# Patient Record
Sex: Male | Born: 1955
Health system: Southern US, Community
[De-identification: ages and names within clinical notes are randomized; demographics above are authoritative.]

## PROBLEM LIST (undated history)

## (undated) DIAGNOSIS — G43909 Migraine, unspecified, not intractable, without status migrainosus: Secondary | ICD-10-CM

## (undated) DIAGNOSIS — C20 Malignant neoplasm of rectum: Secondary | ICD-10-CM

## (undated) DIAGNOSIS — M549 Dorsalgia, unspecified: Secondary | ICD-10-CM

## (undated) DIAGNOSIS — F419 Anxiety disorder, unspecified: Secondary | ICD-10-CM

## (undated) DIAGNOSIS — I10 Essential (primary) hypertension: Secondary | ICD-10-CM

## (undated) DIAGNOSIS — F32A Depression, unspecified: Secondary | ICD-10-CM

## (undated) DIAGNOSIS — R002 Palpitations: Secondary | ICD-10-CM

## (undated) DIAGNOSIS — I2510 Atherosclerotic heart disease of native coronary artery without angina pectoris: Secondary | ICD-10-CM

## (undated) DIAGNOSIS — Z87442 Personal history of urinary calculi: Secondary | ICD-10-CM

## (undated) DIAGNOSIS — J449 Chronic obstructive pulmonary disease, unspecified: Secondary | ICD-10-CM

## (undated) DIAGNOSIS — M25569 Pain in unspecified knee: Secondary | ICD-10-CM

## (undated) DIAGNOSIS — I209 Angina pectoris, unspecified: Secondary | ICD-10-CM

## (undated) DIAGNOSIS — F319 Bipolar disorder, unspecified: Secondary | ICD-10-CM

## (undated) DIAGNOSIS — R7303 Prediabetes: Secondary | ICD-10-CM

## (undated) DIAGNOSIS — K219 Gastro-esophageal reflux disease without esophagitis: Secondary | ICD-10-CM

## (undated) HISTORY — PX: JOINT REPLACEMENT: SHX530

## (undated) HISTORY — PX: BACK SURGERY: SHX140

---

## 2000-01-27 ENCOUNTER — Encounter: Payer: Self-pay | Admitting: Orthopedic Surgery

## 2000-02-01 ENCOUNTER — Inpatient Hospital Stay (HOSPITAL_COMMUNITY): Admission: RE | Admit: 2000-02-01 | Discharge: 2000-02-05 | Payer: Self-pay | Admitting: Orthopedic Surgery

## 2000-02-01 ENCOUNTER — Encounter: Payer: Self-pay | Admitting: Orthopedic Surgery

## 2001-11-07 ENCOUNTER — Encounter: Payer: Self-pay | Admitting: Emergency Medicine

## 2001-11-07 ENCOUNTER — Emergency Department (HOSPITAL_COMMUNITY): Admission: EM | Admit: 2001-11-07 | Discharge: 2001-11-07 | Payer: Self-pay | Admitting: Emergency Medicine

## 2003-02-12 ENCOUNTER — Observation Stay (HOSPITAL_COMMUNITY): Admission: EM | Admit: 2003-02-12 | Discharge: 2003-02-14 | Payer: Self-pay | Admitting: Emergency Medicine

## 2003-02-19 ENCOUNTER — Encounter: Admission: RE | Admit: 2003-02-19 | Discharge: 2003-02-19 | Payer: Self-pay | Admitting: Family Medicine

## 2006-11-21 ENCOUNTER — Emergency Department (HOSPITAL_COMMUNITY): Admission: EM | Admit: 2006-11-21 | Discharge: 2006-11-21 | Payer: Self-pay | Admitting: Emergency Medicine

## 2010-01-16 ENCOUNTER — Ambulatory Visit: Payer: Self-pay | Admitting: Cardiology

## 2010-01-16 DIAGNOSIS — R079 Chest pain, unspecified: Secondary | ICD-10-CM | POA: Insufficient documentation

## 2010-01-16 DIAGNOSIS — R9431 Abnormal electrocardiogram [ECG] [EKG]: Secondary | ICD-10-CM | POA: Insufficient documentation

## 2010-01-16 DIAGNOSIS — R0602 Shortness of breath: Secondary | ICD-10-CM | POA: Insufficient documentation

## 2010-01-16 DIAGNOSIS — I10 Essential (primary) hypertension: Secondary | ICD-10-CM | POA: Insufficient documentation

## 2010-01-26 ENCOUNTER — Telehealth (INDEPENDENT_AMBULATORY_CARE_PROVIDER_SITE_OTHER): Payer: Self-pay

## 2010-01-27 ENCOUNTER — Encounter: Payer: Self-pay | Admitting: Cardiology

## 2010-01-27 ENCOUNTER — Ambulatory Visit: Payer: Self-pay | Admitting: Cardiology

## 2010-01-27 ENCOUNTER — Ambulatory Visit: Payer: Self-pay

## 2010-01-27 ENCOUNTER — Encounter (HOSPITAL_COMMUNITY)
Admission: RE | Admit: 2010-01-27 | Discharge: 2010-04-07 | Payer: Self-pay | Source: Home / Self Care | Attending: Cardiology | Admitting: Cardiology

## 2010-01-27 ENCOUNTER — Ambulatory Visit (HOSPITAL_COMMUNITY): Admission: RE | Admit: 2010-01-27 | Discharge: 2010-01-27 | Payer: Self-pay | Admitting: Cardiology

## 2010-02-04 LAB — CONVERTED CEMR LAB
BUN: 17 mg/dL (ref 6–23)
CO2: 34 meq/L — ABNORMAL HIGH (ref 19–32)
Calcium: 9.8 mg/dL (ref 8.4–10.5)
Chloride: 101 meq/L (ref 96–112)
Creatinine, Ser: 1.5 mg/dL (ref 0.4–1.5)
GFR calc non Af Amer: 50.84 mL/min (ref >60)
Glucose, Bld: 83 mg/dL (ref 70–99)
Potassium: 4.2 meq/L (ref 3.5–5.1)
Sodium: 142 meq/L (ref 135–145)

## 2010-04-07 NOTE — Progress Notes (Signed)
Summary: Nuc. Pre-Procedure  Phone Note Outgoing Call Call back at [facility] Phone [phone]   Call placed by: Irean Hong, RN,  January 26, 2010 11:26 AM Summary of Call: Left message with information on Myoview Information Sheet (see scanned document for details).      Nuclear Med Background Indications for Stress Test: Evaluation for Ischemia   History: Abnormal EKG, Myocardial Perfusion Study  History Comments: '03 MPS: NL, EF=58%.  Symptoms: Chest Tightness, Chest Tightness with Exertion, DOE    Nuclear Pre-Procedure Cardiac Risk Factors: Hypertension, Smoker Height (in): 72

## 2010-04-07 NOTE — Assessment & Plan Note (Signed)
Summary: Cardiology Nuclear Testing  Nuclear Med Background Indications for Stress Test: Evaluation for Ischemia   History: Abnormal EKG, Myocardial Perfusion Study  History Comments: '03 MPS: NL, EF=58%.  Symptoms: Chest Tightness, Chest Tightness with Exertion, DOE, SOB    Nuclear Pre-Procedure Cardiac Risk Factors: Hypertension, Smoker Caffeine/Decaff Intake: none NPO After: 8:00 PM Lungs: clear IV 0.9% NS with Angio Cath: 22g     IV Site: R Hand Chest Size (in) 46     Height (in): 72 Weight (lb): 228 BMI: 31.03 Tech Comments: Propanolol held x 14hrs.  Nuclear Med Study 1 or 2 day study:  1 day     Stress Test Type:  Eugenie Birks Reading MD:  Marca Ancona, MD     Referring MD:  D.McLean Resting Radionuclide:  Technetium 62m Tetrofosmin     Resting Radionuclide Dose:  10.6 mCi  Stress Radionuclide:  Technetium 75m Tetrofosmin     Stress Radionuclide Dose:  33 mCi   Stress Protocol  Max Systolic BP: 138 mm Hg Lexiscan: 0.4 mg   Stress Test Technologist:  Milana Na, EMT-P     Nuclear Technologist:  Domenic Polite, CNMT  Rest Procedure  Myocardial perfusion imaging was performed at rest 45 minutes following the intravenous administration of Technetium 12m Tetrofosmin.  Stress Procedure  The patient received IV Lexiscan 0.4 mg over 15-seconds.  Technetium 106m Tetrofosmin injected at 30-seconds.  There were no significant changes with infusion.  Quantitative spect images were obtained after a 45 minute delay.  QPS Raw Data Images:  Normal; no motion artifact; normal heart/lung ratio. Stress Images:  Small basal inferior perfusion defect, slightly more prominent than rest. Rest Images:  Small basal inferior perfusion defect Subtraction (SDS):  Small basal inferior perfusion, partially reversible.  Transient Ischemic Dilatation:  1.26  (Normal <1.22)  Lung/Heart Ratio:  .34  (Normal <0.45)  Quantitative Gated Spect Images QGS EDV:  140 ml QGS ESV:  67 ml QGS EF:   52 % QGS cine images:  Mild global hypokinesis   Overall Impression  Exercise Capacity: Lexiscan with no exercise. BP Response: Normal blood pressure response. Clinical Symptoms: Short of breath.  ECG Impression: LVH with significant repolarization abnormality at baseline, no change with infusion.  Overall Impression: Small basal inferior perfusion defect with partial reversibility, suspect most likely diaphragmatic attenuation.  Overall Impression Comments: Overall low risk study.   Appended Document: Cardiology Nuclear Testing Low risk study  Appended Document: Cardiology Nuclear Testing pt given results by telephone

## 2010-04-07 NOTE — Assessment & Plan Note (Signed)
Summary: np6/abn ekg/secure horzion/mj   Visit Type:  new pt Primary Provider:  Reuel Boom  CC:  headache.  History of Present Illness: 55 yo with history of HTN and bipolar disorder presents for evaluation of abnormal ECG.  Patient's primary provider noted anterior and lateral T wave inversions and ST depression on recent ECG, so he was referred for cardiology evaluation.  Patient does report episodes of central chest tightness.  This occurs with heavier exertion, such as walking fast up steps.  It also occurs with stress.  He tends to get this symptom rarely (maybe once a month for several years).  He gets short of breath with walking long distances but does smoke.  No orthopnea.  Physical exertion is limited by knee pain.  He did have an adenosine myoview in the office 10-15 years ago (? prior to knee surgery) that was normal per his report.  BP is elevated at 147/102 today.   ECG: NSR, anterior and lateral T wave inversions with 1 mm ST depression (no prior recently in our system and old chart not available at this time).   Labs (6/11): LDL 93, HDL 35, TGs 240, HCT 51.4  Current Medications (verified): 1)  Thiothixene 5 Mg Caps (Thiothixene) .... Once Daily 2)  Benztropine Mesylate 1 Mg Tabs (Benztropine Mesylate) .... Once Daily 3)  Propranolol Hcl 40 Mg Tabs (Propranolol Hcl) .... Two Times A Day 4)  Lotensin Hct 20-12.5 Mg Tabs (Benazepril-Hydrochlorothiazide) .... Once Daily 5)  Depakote 500 Mg Tbec (Divalproex Sodium) .... 2 Tablets Once Daily 6)  Alprazolam 1 Mg Tabs (Alprazolam) .... Take 1 Tablet, 4 Times Daily 7)  Aspirin 81 Mg Tbec (Aspirin) .... Take One Tablet By Mouth Daily  Allergies (verified): No Known Drug Allergies  Past History:  Past Medical History: 1. hypertension  2. bipolar disorder 3. migraine headaches 4. TKR 2001 5. Active smoker  Family History: No CAD or other heart trouble that he knows of.   Social History: Tobacco Use - Yes, 1/2 ppd.  Drug Use  - no On disability Lives in [place] with girlfriend, has 2 sons  Review of Systems       All systems reviewed and negative except as per HPI.   Vital Signs:  Patient profile:   55 year old male Height:      72 inches Weight:      230.75 pounds BMI:     31.41 Pulse rate:   93 / minute BP sitting:   147 / 102  (left arm) Cuff size:   large  Vitals Entered By: Caralee Ates CMA (January 16, 2010 9:58 AM)  Physical Exam  General:  Well developed, well nourished, in no acute distress. Head:  normocephalic and atraumatic Nose:  no deformity, discharge, inflammation, or lesions Mouth:  Teeth, gums and palate normal. Oral mucosa normal. Neck:  Neck supple, no JVD. No masses, thyromegaly or abnormal cervical nodes. Lungs:  Slightly decreased breath sounds bilaterally.  Heart:  Non-displaced PMI, chest non-tender; regular rate and rhythm, S1, S2 without murmurs, rubs. +S4. Carotid upstroke normal, no bruit. Normal abdominal aortic size, no bruits. Femorals normal pulses, no bruits. Pedals normal pulses. No edema, no varicosities. Abdomen:  Bowel sounds positive; abdomen soft and non-tender without masses, organomegaly, or hernias noted. No hepatosplenomegaly. Extremities:  No clubbing or cyanosis. Neurologic:  Alert and oriented x 3. Skin:  Intact without lesions or rashes. Psych:  Normal affect.   Impression & Recommendations:  Problem # 1:  CHEST PAIN (ICD-786.50)  Patient has occasional episodes of chest tightness with heavier exertion like going up stairs fast.  He also gets more atypical symptoms with emotional stress.  His ECG is abnormal with T wave inversions and ST depression.  No prior ECG to compare.  This could be a form of LVH with repolarization abnormality, but the ECG does not strictly fit the criteria for LVH, so coronary disease is certainly a worry.  I will set him up for a Lexiscan myoview to assess for ischemia.  I will also get an echo given shortness of breath  (though this may be due to smoking). He will continue ASA 81 mg daily.   Problem # 2:  HYPERTENSION, UNSPECIFIED (ICD-401.9) BP is elevated, S4 on exam.  Increase benazepril/HCT to 40/25.  BMET prior to followup appointment.  Will reassess BP at followup.   Problem # 3:  SMOKING I strongly encouraged the patient to quit smoking.    Other Orders: Nuclear Stress Test (Nuc Stress Test) Echocardiogram (Echo)  Patient Instructions: 1)  Your physician has recommended you make the following change in your medication:  2)  Increase Benazepril/HCT to 40/25mg  daily--you can take two Benazepril/HCT 20mg /12.5mg  daily at the same time. 3)  Your physician has requested that you have an lexiscan myoview.  For further information please visit [email or web address]  Please follow instruction sheet, as given. 4)  Your physician has requested that you have an echocardiogram.  Echocardiography is a painless test that uses sound waves to create images of your heart. It provides your doctor with information about the size and shape of your heart and how well your heart's chambers and valves are working.  This procedure takes approximately one hour. There are no restrictions for this procedure. 5)  Your physician recommends that you return for lab work in: 2 weeks--BMP 786.50  6)  Your physician recommends that you schedule a follow-up appointment in: 2 weeks with Dr Shirlee Latch. It is OK to have the lab the same day you see Dr Shirlee Latch. Prescriptions: BENAZEPRIL-HYDROCHLOROTHIAZIDE 20-12.5 MG TABS (BENAZEPRIL-HYDROCHLOROTHIAZIDE) two tablets daily  #60 x 6   Entered by:   Katina Dung, RN, BSN   Authorized by:   Marca Ancona, MD   Signed by:   Katina Dung, RN, BSN on 01/16/2010   Method used:   Electronically to        [facility] [phone]* (retail)       [address]       [place], [place]  [address]       Ph: [phone] or [phone]       Fax: [phone]   RxID:    [id number]

## 2010-07-24 NOTE — Procedures (Signed)
. [facility]  Patient:    William Good, William Good                  MRN: [id number] Proc. Date: 02/01/00 Adm. Date:  [id number] Attending:  Colbert Ewing                           Procedure Report  I was consulted by Dr. Richardson Landry to provide postoperative pain relief for his patient, William Good.  We decided this should take the form of epidural catheter placed postoperatively and monitored by the anesthesiologist.  The risks and benefits of the procedure were discussed with the patient in the preoperative period.  He is aware of the risks and benefits and agreed with the procedure.  At the completion of the operative procedure, the patient was turned into the right lateral decubitus position.  The back was sprayed fully with Betadine spray.  The area was draped.  A #18 gauge Hustead needle was used in a paramedian approach at the L2-3 interspace.  A loss of resistance technique with air was used.  The epidural space was located easily on the first attempt.  A Burron catheter was passed 5 cm into the epidural space.  The catheter passed easily.  The needle was removed without apparent movement of the catheter.  The catheter was aspirated.  There was no evidence of spinal fluid or blood.  The catheter was injected with a solution containing 10 cc of sterile preservative-free normal saline, 5 cc of 1% lidocaine plain, and 100 mcg of fentanyl in 2 cc.  The solution injected easily.  Repeat aspiration revealed no evidence of cerebrospinal fluid or blood.  Catheter was taped securely in place.  The patient was placed in a supine position and emerged uneventfully from the anesthetic.  The patient was brought to the recovery room in good condition. DD:  02/01/00 TD:  02/01/00 Job: [id number]

## 2010-07-24 NOTE — Discharge Summary (Signed)
[place]  Patient:    William Good, William Good                  MRN: [id number] Adm. Date:  [id number] Disc. Date: [id number] Attending:  Colbert Ewing Dictator:   Oris Drone. Petrarca, P.A.-C.                           Discharge Summary  ADMISSION DIAGNOSIS:  Advanced degenerative joint disease of the right knee.  DISCHARGE DIAGNOSES: 1. Advanced degenerative joint disease of the right knee. 2. History of angina. 3. Tobacco use. 4. Migraine.  PROCEDURE:  Right total knee replacement.  HISTORY OF PRESENT ILLNESS:  [age]-year-old male with persistent right knee pain.  He has documented grade IV DJD by arthroscopy in May 2000.  He has failed conservative treatment.  He is now indicated for right total knee replacement.  HOSPITAL COURSE:  [age]-year-old white male admitted February 01, 2000. After appropriate laboratory studies were obtained as well as 1 g IV Ancef on call to the operating room, he was taken to operating room where he underwent a right total knee replacement.  He tolerated the procedure well.  He was continued postoperatively on Ancef 1 g IV q.8h. x three doses.  Heparin and Coumadin were begun for antithrombotic prophylaxis as per protocol. Consultation with PT and OT were obtained.  Physical therapy for ambulation and weight bearing as tolerated on the right.  A CPM was placed at 0-30 degrees and incremented daily by 10 degrees at 8 hours per day.  He was allowed out of bed to chair the following day.  He did have temperature elevation, and this resolved.  He had a postoperative ileus and this was resolved with conservative treatment.  Reglan 10 mg IV q.8h. was begun.  The remainder of his hospital course was uneventful.  He was discharged on February 05, 2000, to return back to the office in 10 days for suture removal.  LABORATORY DATA:  The EKG showed normal sinus rhythm, nonspecific  T-wave abnormalities.  Radiographic studies February 01, 2000, showed satisfactory right total knee replacement.  Laboratory studies:  Preoperative hemoglobin 16.8, hematocrit 47.7%, white count 13,800, platelet count 324,000.  Discharge hemoglobin 12.1, hematocrit 33.2%.  Preoperative chemistries:  Sodium 141, potassium 4.3, chloride 104, CO2 29, glucose 93, creatinine 0.9, calcium 9.6, total protein 6.7, albumin 4.2, AST 24, ALT 34, alkaline phosphatase 74, total bilirubin 1.1.  Discharge sodium 137, potassium 3.2, chloride 101, CO2 27, glucose 120, BUN 8, creatinine 0.9, calcium 8.7.  Urinalysis showed moderate hemoglobin, 0-5 white cells, 0-5 red cells and uric acid crystals noted.  Blood type was O positive. Antibody screen negative.  DISCHARGE INSTRUCTIONS: 1. Given a prescription for Percocet 5/325 mg one q.4h. p.r.n. pain. 2. Parafon Forte DSC one q.8h. p.r.n. spasms. 3. Iron sulfate 325 mg one p.o. daily with meals. 4. Coumadin 5 mg as per pharmacy. 5. No restriction on his diet. 6. Activity as tolerated.  Weight bearing as tolerated. 7. He will follow up in the office in 10 days for re-evaluation    and removal of staples.  DISCHARGE CONDITION:  He was discharged in improved condition. DD:  03/14/00 TD:  03/14/00 Job: [id number]

## 2010-07-24 NOTE — Op Note (Signed)
[place]. [facility]  Patient:    William Good, William Good                  MRN: [id number] Proc. Date: 02/01/00 Adm. Date:  [id number] Attending:  Colbert Ewing                           Operative Report  PREOPERATIVE DIAGNOSES:  End-stage degenerative joint disease right knee status post previous anterior cruciate ligament reconstruction with retained metallic screws in the tibia and femur.  POSTOPERATIVE DIAGNOSIS:  End-stage degenerative joint disease right knee status post previous anterior cruciate ligament reconstruction with retained metallic screws in the tibia and femur.  PROCEDURE:  Total knee replacement right knee utilizing osteonics prosthesis, press-fit #9 cruciate retaining femoral component.  Cemented #9 tibial component with 10 mm polyethylene insert.  28 mm cemented recessed nonmetal back 25 patellar component.  Appropriate soft tissue balancing with lateral retinacular release.  Removal of retained [place] metallic screws from the tibia and femur.  SURGEON:  Loreta Ave, M.D.  ASSISTANT: Arlys John D. Petrarca, P.A.-C.  ANESTHESIA:  General.  ESTIMATED BLOOD LOSS:  Minimal.  TOURNIQUET TIME:  1 hour and 20 minutes.  SPECIMENS:  Excised bone and soft tissue.  Removed screws given to the patient.  CULTURES:  NOne.  COMPLICATIONS:  None.  DRAIN:  Hemovac x 2.  DRESSING:  Sterile compressive.  PROCEDURE:  The patient was brought to the operating room and placed on operating table in supine position.  After adequate anesthesia had been obtained the right knee examined.  Cruciate deficient knee but the PCL was intact.  5 degree flexion contracture alignment slight varus, further flexion to 120 degrees.  Collaterals stable with some tightness on the medial side. Tourniquet applied, prepped and draped in usual sterile fashion. Exsanguinated with elevation and Esmarch.  Tourniquet inflated to 350 mmHg. Straight incision  above the patella down to the tibial tubercle incorporating his previous incision which went from the patella down to the tibial tubercle.  Skin and subcutaneous divided.  Medial aspect of the knee exposed maintaining as thick as flaps as possible.  Medial parapatellar arthrotomy exposing the knee.  Dissection carried down releasing the medial capsule and structures to balance the knee.  Through this approach the screw in the tibia was exposed, unroofed capture with the screw backed out without difficulty.  I then dissected along the lateral femoral context to the posterior aspect. The screw there was carefully unroofed and also backed out without difficulty. Both of the cancellous tunnels from screw removal were curetted out and then packed with cancellous autograft bone which was harvested from the femoral cuttings prior to closure.  Attention was then turned to the knee.  Remnant ACL and menisci removed.  PCL, popliteal tendon retained.  Periarticular spurs removed.  Grade 4 changes patellofemoral joint and medial compartment.  Distal femur exposed. Intermedullary guide placed.  Distal cuts set at 5 degrees of valgus removing 10 mL.  Sized for a #9 cruciate retaining component.  Jigs put in place and definitive cuts made.  Trial put in place and found to fit well.  Trial removed.  Recesses cleared out and all periarticular spurs removed including the back of the knee.  Tibia exposed.  Tibial spine removed with a saw. Intermedullary guide placed after sizing this for a #9 component.  Proximal cut removing 6 mm off the deficient medial side with the 5 degree posterior  slope cut protecting posterior and collateral structures.  Jigs removed. Trials put in place with the #9 component above and below and the 10 mm component polyethylene type.  I was able to achieve a balanced knee at 5 degrees of valgus.  Sufficient release of the medial structures to achieve this.  Full extension, full  flexion without component lift off.  The tibial was marked for rotation and then hand reamed.  Patella was then sized, reamed and drilled for a 28 mm patella.  All trials were put in place. I had an excellent alignment at 5 degrees of valgus.  Stable knee, retaining PCL.  Full extension, full flexion.  Lateral tracking and tethering necessitating lateral release which was performed from the inside out, which after completed marked and improved patellofemoral tracking.  All trials removed.  Copious irrigation with a pulse irrigating device including all recesses.  Cancellous bone was packed into the screw holes in the femur and tibia.  Cement prepared, and placed on the tibial component which was then hammered in place.  Excessive cement removed. Polyethylene attached.  Femoral component hammered in place and the knee reduced.  Patellofemoral component was seated and compressed.  After cement removed from the margins it was held there with a clamp until the cement hardened.  Once the cement had hardened, the knee was reexamined.  Full extension, full flexion, nicely balanced knee, set at 5 degrees of valgus with good patellofemoral tacking.  Wound irrigated again.  Hemovacs placed and brought out through separate stab wounds.  Arthrotomy closed with #1 Vicryl. Skin and subcutaneous tissue with Vicryl and staples.  Margins of the wound injected with Marcaine as was the knee.  Hemovacs were clamped.  Sterile compressive dressing applied.  Tourniquet inflated and removed.  Knee immobilizer applied.  Anesthesia reversed and brought to recovery room. Tolerated surgery well, no complications. DD:  02/01/00 TD:  02/01/00 Job: [id number]

## 2010-07-24 NOTE — Discharge Summary (Signed)
NAME:  William Good, William Good                     ACCOUNT NO.:  [id number]   MEDICAL RECORD NO.:  [id number]                   PATIENT TYPE:  INP   LOCATION:  4702                                 FACILITY:  [facility]   PHYSICIAN:  Leighton Roach McDiarmid, M.D.             DATE OF BIRTH:  December 06, 1955   DATE OF ADMISSION:  02/12/2003  DATE OF DISCHARGE:  02/14/2003                                 DISCHARGE SUMMARY   DISCHARGE DIAGNOSES:  1. Palpitations.  2. Hypertension.  3. Migraines.   PROCEDURES:  None.   CONSULTATIONS:  None.   HISTORY OF PRESENT ILLNESS:  William Good is a 55 year old white male who  was admitted secondary to symptoms related to palpitations.  The patient  states that at times he will become very short of breath, feel palpitations  in his chest, become diaphoretic, and feel as though he is groin to pass  out.  He says he feels that his heart is racing.  He states that these last  for 25 to 30 minutes.  There seems to be no known aggravating circumstance  to these episodes as they have happened while he was awake and while he was  asleep.  The last episode happened while he was driving, and he felt as if  he was going to pass out.   PHYSICAL EXAMINATION:  VITAL SIGNS:  On admission, the patient was afebrile,  pulse 76, blood pressure 152/122.  Blood pressure resolved over the course  of a few hours to 138/91.  GENERAL:  The patient had no increased work of breathing and was in no  apparent distress.  HEENT:  Exam was normal.  CARDIOVASCULAR:  Exam was normal.  RESPIRATORY:  Exam had bilateral rhonchi but was otherwise clear to  auscultation.  ABDOMEN:  Benign.  EXTREMITIES:  No edema.  NEUROLOGIC:  There were no focal neurologic deficits.   LABORATORY DATA:  The patient was ruled out for acute coronary event with  serial cardiac enzymes.  INR was 0.8.  Sodium 141, potassium 3.2, bicarb 22,  BUN 18, creatinine 10.9, glucose 111.   Please see the admission H&P for  full admission details.   HOSPITAL COURSE:  #1.  PALPITATIONS:  As stated above, the patient was ruled out with cardiac  enzymes.  The patient was placed on telemetry.  No abnormal events were  recorded during hospital stay.  A TSH was checked which was  within normal  limits.  EKG also showed normal sinus rhythm with no distinct abnormalities.  Fasting lipid profile was obtained.  Total cholesterol was 194, HDL 37, LDL  115, VLDL 42, triglycerides 209.  The patient had no other episodes of  shortness of breath or chest tightness during hospital course.  Due to the  unclear nature of the patient's symptoms, the patient was discharged after  being placed on a beta blocker.  In addition, the patient's information was  given to [facility]  where he will be set up with a 30-day event  monitor.  The patient will be called by [facility] in order to obtain this  monitor.  In addition to these measures taken, a 24-hour urine was collected  to assess for urine metanephrine's, catecholamines, and VMA to rule out for  pheochromocytoma.   #2.  HYPERTENSION:  As stated above, the patient's initial blood pressure  was 152/122; however, this quickly resolved.  The patient's blood pressure  remained below 140/90 throughout hospital course with use of his outpatient  hypertensive medications.  However, due to the patient's symptoms, the  patient's Norvasc was discontinued, and the patient was placed on Toprol XL  50 mg p.o. daily.   #3.  MIGRAINES:  Given that the patient uses Imitrex for migraines, he was  instructed that he should use this only if he feels it is absolutely  necessary as it may contribute to his symptoms.   DISPOSITION:  The patient was discharged home.   DISCHARGE MEDICATIONS:  1. Toprol XL 50 mg p.o. daily.  2. Altace 10 mg p.o. daily.  3. Aspirin 81 mg p.o. daily.   SPECIAL INSTRUCTIONS:  As stated above, the patient will be contacted by  [facility] for a 30-day  event monitor.  In addition, he is  instructed to avoid the caffeine in Imitrex as much as possible.  The  patient was also instructed to contact his primary care doctor if the  symptoms persist or begin to worsen.   SUGGESTED FOLLOWUP ITEMS:  The patient's pheochromocytoma workup, including  the urinary catecholamines and VMA, were pending at time of discharge.  We  will attempt to follow these values up to notify the patient and/or primary  care doctor of any abnormalities.      Franchot Mimes, MD                         Etta Grandchild, M.D.    TV/MEDQ  D:  02/14/2003  T:  02/15/2003  Job:  [id number]

## 2010-12-17 LAB — BASIC METABOLIC PANEL
GFR calc non Af Amer: >60
Potassium: 3.8
Sodium: 139

## 2010-12-17 LAB — POCT CARDIAC MARKERS
CKMB, poc: 2.3
Operator id: 1627
Troponin i, poc: <0.05
Troponin i, poc: <0.05

## 2011-01-25 ENCOUNTER — Other Ambulatory Visit: Payer: Self-pay | Admitting: Cardiology

## 2011-03-11 ENCOUNTER — Other Ambulatory Visit: Payer: Self-pay | Admitting: Cardiology

## 2011-06-15 ENCOUNTER — Telehealth: Payer: Self-pay | Admitting: Cardiology

## 2011-06-15 NOTE — Telephone Encounter (Signed)
Stress,LOV,12,Echo faxed to [facility] @ [place] @ [phone] 06/15/11/KM

## 2012-04-26 ENCOUNTER — Other Ambulatory Visit: Payer: Self-pay | Admitting: Neurology

## 2012-05-02 ENCOUNTER — Other Ambulatory Visit: Payer: Self-pay

## 2012-05-09 ENCOUNTER — Other Ambulatory Visit: Payer: Self-pay

## 2012-10-12 ENCOUNTER — Other Ambulatory Visit: Payer: Self-pay

## 2012-10-17 ENCOUNTER — Ambulatory Visit
Admission: RE | Admit: 2012-10-17 | Discharge: 2012-10-17 | Disposition: A | Discharge status: Home / Self Care | Payer: Medicare Other | Source: Ambulatory Visit | Attending: Neurology | Admitting: Neurology

## 2012-10-17 DIAGNOSIS — R404 Transient alteration of awareness: Secondary | ICD-10-CM

## 2012-10-17 MED ORDER — GADOBENATE DIMEGLUMINE 529 MG/ML IV SOLN
15.0000 mL | Freq: Once | INTRAVENOUS | Status: AC | PRN
  Administered 2012-10-17: 15 mL via INTRAVENOUS

## 2012-10-26 ENCOUNTER — Telehealth: Payer: Self-pay

## 2012-10-26 NOTE — Telephone Encounter (Signed)
Message copied by [facility] on Thu Oct 26, 2012  8:56 AM ------      Message from: Levert Feinstein      Created: Wed Oct 25, 2012  4:38 PM       Please call pt for normal MRI brain. ------

## 2012-10-26 NOTE — Telephone Encounter (Signed)
I called and gave patient MRI results. He said he was going to call yesterday because he was having a pretty bad day. I asked if he was okay today. He said he was better but his problem is that he forgets things all the time. I let him know that may be part of aging and he could benefit from keeping a small not book with him and writing things down. He said that was a good idea and he guessed he could not stop this aging thing. He thanked me for the information.

## 2015-03-13 ENCOUNTER — Other Ambulatory Visit (HOSPITAL_BASED_OUTPATIENT_CLINIC_OR_DEPARTMENT_OTHER): Payer: Self-pay | Admitting: Family Medicine

## 2015-03-13 DIAGNOSIS — G43809 Other migraine, not intractable, without status migrainosus: Secondary | ICD-10-CM

## 2015-03-21 ENCOUNTER — Ambulatory Visit (HOSPITAL_BASED_OUTPATIENT_CLINIC_OR_DEPARTMENT_OTHER)
Admission: RE | Admit: 2015-03-21 | Discharge: 2015-03-21 | Disposition: A | Discharge status: Home / Self Care | Payer: Medicare Other | Source: Ambulatory Visit | Attending: Family Medicine | Admitting: Family Medicine

## 2015-03-21 DIAGNOSIS — I739 Peripheral vascular disease, unspecified: Secondary | ICD-10-CM | POA: Insufficient documentation

## 2015-03-21 DIAGNOSIS — G43809 Other migraine, not intractable, without status migrainosus: Secondary | ICD-10-CM | POA: Diagnosis not present

## 2015-03-21 DIAGNOSIS — G43909 Migraine, unspecified, not intractable, without status migrainosus: Secondary | ICD-10-CM | POA: Diagnosis present

## 2015-05-13 ENCOUNTER — Ambulatory Visit: Payer: Medicare Other | Admitting: Neurology

## 2015-05-20 ENCOUNTER — Ambulatory Visit: Payer: Medicare Other | Admitting: Neurology

## 2017-01-17 ENCOUNTER — Encounter (HOSPITAL_COMMUNITY): Payer: Self-pay

## 2017-01-17 ENCOUNTER — Emergency Department (HOSPITAL_COMMUNITY)
Admission: EM | Admit: 2017-01-17 | Discharge: 2017-01-17 | Disposition: A | Discharge status: Home / Self Care | Payer: Medicare Other | Attending: Emergency Medicine | Admitting: Emergency Medicine

## 2017-01-17 ENCOUNTER — Emergency Department (HOSPITAL_COMMUNITY): Payer: Medicare Other

## 2017-01-17 ENCOUNTER — Other Ambulatory Visit: Payer: Self-pay

## 2017-01-17 DIAGNOSIS — I10 Essential (primary) hypertension: Secondary | ICD-10-CM | POA: Diagnosis not present

## 2017-01-17 DIAGNOSIS — R9431 Abnormal electrocardiogram [ECG] [EKG]: Secondary | ICD-10-CM | POA: Insufficient documentation

## 2017-01-17 DIAGNOSIS — Z79899 Other long term (current) drug therapy: Secondary | ICD-10-CM | POA: Diagnosis not present

## 2017-01-17 DIAGNOSIS — F17210 Nicotine dependence, cigarettes, uncomplicated: Secondary | ICD-10-CM | POA: Insufficient documentation

## 2017-01-17 DIAGNOSIS — R0602 Shortness of breath: Secondary | ICD-10-CM | POA: Diagnosis not present

## 2017-01-17 DIAGNOSIS — R1013 Epigastric pain: Secondary | ICD-10-CM | POA: Diagnosis not present

## 2017-01-17 HISTORY — DX: Dorsalgia, unspecified: M54.9

## 2017-01-17 HISTORY — DX: Pain in unspecified knee: M25.569

## 2017-01-17 HISTORY — DX: Migraine, unspecified, not intractable, without status migrainosus: G43.909

## 2017-01-17 HISTORY — DX: Essential (primary) hypertension: I10

## 2017-01-17 LAB — COMPREHENSIVE METABOLIC PANEL
ALT: 33 U/L (ref 17–63)
ANION GAP: 9 (ref 5–15)
AST: 40 U/L (ref 15–41)
Albumin: 3.6 g/dL (ref 3.5–5.0)
Alkaline Phosphatase: 81 U/L (ref 38–126)
BUN: 11 mg/dL (ref 6–20)
CHLORIDE: 102 mmol/L (ref 101–111)
CO2: 27 mmol/L (ref 22–32)
Calcium: 8.6 mg/dL — ABNORMAL LOW (ref 8.9–10.3)
Creatinine, Ser: 1.41 mg/dL — ABNORMAL HIGH (ref 0.61–1.24)
GFR calc non Af Amer: 52 mL/min — ABNORMAL LOW (ref >60)
GLUCOSE: 140 mg/dL — AB (ref 65–99)
Potassium: 3.8 mmol/L (ref 3.5–5.1)
SODIUM: 138 mmol/L (ref 135–145)
Total Bilirubin: 0.4 mg/dL (ref 0.3–1.2)
Total Protein: 6.3 g/dL — ABNORMAL LOW (ref 6.5–8.1)

## 2017-01-17 LAB — CBC WITH DIFFERENTIAL/PLATELET
BASOS ABS: 0 10*3/uL (ref 0.0–0.1)
Basophils Relative: 0 %
EOS PCT: 3 %
Eosinophils Absolute: 0.2 10*3/uL (ref 0.0–0.7)
HCT: 45.4 % (ref 39.0–52.0)
Hemoglobin: 15.7 g/dL (ref 13.0–17.0)
LYMPHS ABS: 2.4 10*3/uL (ref 0.7–4.0)
LYMPHS PCT: 28 %
MCH: 34.6 pg — ABNORMAL HIGH (ref 26.0–34.0)
MCHC: 34.6 g/dL (ref 30.0–36.0)
MCV: 100 fL (ref 78.0–100.0)
Monocytes Absolute: 0.5 10*3/uL (ref 0.1–1.0)
Monocytes Relative: 6 %
NEUTROS PCT: 63 %
Neutro Abs: 5.3 10*3/uL (ref 1.7–7.7)
PLATELETS: 145 10*3/uL — AB (ref 150–400)
RBC: 4.54 MIL/uL (ref 4.22–5.81)
RDW: 12.7 % (ref 11.5–15.5)
WBC: 8.4 10*3/uL (ref 4.0–10.5)

## 2017-01-17 LAB — I-STAT TROPONIN, ED
TROPONIN I, POC: 0.04 ng/mL (ref 0.00–0.08)
Troponin i, poc: 0.04 ng/mL (ref 0.00–0.08)

## 2017-01-17 LAB — LIPASE, BLOOD: LIPASE: 28 U/L (ref 11–51)

## 2017-01-17 MED ORDER — PANTOPRAZOLE SODIUM 40 MG PO TBEC
40.0000 mg | DELAYED_RELEASE_TABLET | Freq: Once | ORAL | Status: AC
  Administered 2017-01-17: 40 mg via ORAL
  Filled 2017-01-17: qty 1

## 2017-01-17 MED ORDER — OMEPRAZOLE 20 MG PO CPDR: 20.0000 mg | DELAYED_RELEASE_CAPSULE | Freq: Two times a day (BID) | ORAL | 0 refills | Status: AC

## 2017-01-17 NOTE — Discharge Instructions (Signed)
New prescription for acid blocker for your stomach. Call cardiology office for outpatient stress test.  Continue your current blood pressure medications as prescribed.  81 mg baby aspirin per day.

## 2017-01-17 NOTE — ED Triage Notes (Signed)
Patient was at his PCP today and reported that he was having frequent indigestion. An EKG was done and was told to come to the ED for further evaluation. Patient also c/o increased SOB with activity such as walking, going up steps, etc. Patient c/o mid abdominal pain up to this throat.

## 2017-01-17 NOTE — ED Provider Notes (Addendum)
[facility] DEPT Provider Note   CSN: [id number] Arrival date & time: 01/17/17  1046     History   Chief Complaint Chief Complaint  Patient presents with  . Shortness of Breath  . Abdominal Pain    HPI William Good is a 61 y.o. male. Chief complaint is abnormal EKG, abdominal pain.  HPI:  35 twoyear-old male. Was at his primary care physician's office today because of heartburn. Also describes some exertional shortness of breath. No chest pain. EKG showed marked T waves diffusely and was referred here  Patient has history of LVH via previous echo, and diffuse T-wave inversions noted on EKG 2008.  Echo 2008:   Impressions:  - Normal LV size with moderate hypertrophy. In some views, there  appears to be asymmetric septal hypertrophy. There is no LV  outflow tract gradient or mitral systolic anterior motion. EF 55%  with no regional wall motion abnormalities. No aortic stenosis or  mitral regurgitation. Normal RV size and systolic function.    Past Medical History:  Diagnosis Date  . Back pain   . Hypertension   . Knee pain   . Migraine     Patient Active Problem List   Diagnosis Date Noted  . HYPERTENSION, UNSPECIFIED 01/16/2010  . SHORTNESS OF BREATH 01/16/2010  . CHEST PAIN 01/16/2010  . ELECTROCARDIOGRAM, ABNORMAL 01/16/2010    Past Surgical History:  Procedure Laterality Date  . BACK SURGERY    . JOINT REPLACEMENT         Home Medications    Prior to Admission medications   Medication Sig Start Date End Date Taking? Authorizing Provider  acetaminophen (TYLENOL) 325 MG tablet Take 650 mg every 4 (four) hours as needed by mouth for mild pain.   Yes [provider]  ALPRAZolam Duanne Moron) 1 MG tablet Take 1 mg 2 (two) times daily by mouth. 2 mg in the morning and 2 mg in the evening 01/08/17  Yes [provider]  benztropine (COGENTIN) 1 MG tablet Take 1 mg at bedtime by mouth. 12/16/16   Yes [provider]  DM-Doxylamine-Acetaminophen (NYQUIL COLD & FLU PO) Take 20 mLs daily as needed by mouth (cold/flu symptoms).   Yes [provider]  LOTENSIN HCT 20-12.5 MG per tablet TAKE (2) TABLETS DAILY Patient taking differently: Take 1 tablet by mouth once daily 03/11/11  Yes Larey Dresser, MD  meloxicam (MOBIC) 15 MG tablet Take 15 mg daily by mouth.   Yes [provider]  pravastatin (PRAVACHOL) 20 MG tablet Take 20 mg daily by mouth. 01/07/17  Yes [provider]  propranolol ER (INDERAL LA) 160 MG SR capsule Take 160 mg daily by mouth. 10/25/16  Yes [provider]  risperiDONE (RISPERDAL) 3 MG tablet Take 3 mg at bedtime by mouth.   Yes [provider]  rOPINIRole (REQUIP) 0.5 MG tablet Take 0.5 mg by mouth. Take 2 tablets by mouth 1 hour before bed   Yes [provider]  SUMAtriptan (IMITREX) 100 MG tablet Take 50-100 mg 2 (two) times daily as needed by mouth for migraine. 01/07/17  Yes [provider]  omeprazole (PRILOSEC) 20 MG capsule Take 1 capsule (20 mg total) 2 (two) times daily by mouth. 01/17/17   Tanna Furry, MD    Family History Family History  Problem Relation Age of Onset  . ALS Mother   . Heart failure Father     Social History Social History   Tobacco Use  .  Smoking status: Current Every Day Smoker    Packs/day: 0.50    Types: Cigarettes  . Smokeless tobacco: Never Used  Substance Use Topics  . Alcohol use: No    Frequency: Never  . Drug use: No     Allergies   Patient has no known allergies.   Review of Systems Review of Systems  Constitutional: Negative for appetite change, chills, diaphoresis, fatigue and fever.  HENT: Negative for mouth sores, sore throat and trouble swallowing.   Eyes: Negative for visual disturbance.  Respiratory: Positive for chest tightness. Negative for cough, shortness of breath and wheezing.   Cardiovascular: Positive for chest pain.    Gastrointestinal: Negative for abdominal distention, abdominal pain, diarrhea, nausea and vomiting.  Endocrine: Negative for polydipsia, polyphagia and polyuria.  Genitourinary: Negative for dysuria, frequency and hematuria.  Musculoskeletal: Negative for gait problem.  Skin: Negative for color change, pallor and rash.  Neurological: Negative for dizziness, syncope, light-headedness and headaches.  Hematological: Does not bruise/bleed easily.  Psychiatric/Behavioral: Negative for behavioral problems and confusion.     Physical Exam Updated Vital Signs BP (!) 160/114 (BP Location: Right Arm)   Pulse 63   Temp 98.4 F (36.9 C) (Oral)   Resp 19   Ht 6\' 1"  (1.854 m)   Wt 113.4 kg (250 lb)   SpO2 97%   BMI 32.98 kg/m   Physical Exam  Constitutional: He is oriented to person, place, and time. He appears well-developed and well-nourished. No distress.  HENT:  Head: Normocephalic.  Eyes: Conjunctivae are normal. Pupils are equal, round, and reactive to light. No scleral icterus.  Neck: Normal range of motion. Neck supple. No thyromegaly present.  Cardiovascular: Normal rate and regular rhythm. Exam reveals no gallop and no friction rub.  No murmur heard. Pulmonary/Chest: Effort normal and breath sounds normal. No respiratory distress. He has no wheezes. He has no rales.  Abdominal: Soft. Bowel sounds are normal. He exhibits no distension. There is no tenderness. There is no rebound.  Musculoskeletal: Normal range of motion.  Neurological: He is alert and oriented to person, place, and time.  Skin: Skin is warm and dry. No rash noted.  Psychiatric: He has a normal mood and affect. His behavior is normal.     ED Treatments / Results  Labs (all labs ordered are listed, but only abnormal results are displayed) Labs Reviewed  CBC WITH DIFFERENTIAL/PLATELET - Abnormal; Notable for the following components:      Result Value   MCH 34.6 (*)    Platelets 145 (*)    All other  components within normal limits  COMPREHENSIVE METABOLIC PANEL - Abnormal; Notable for the following components:   Glucose, Bld 140 (*)    Creatinine, Ser 1.41 (*)    Calcium 8.6 (*)    Total Protein 6.3 (*)    GFR calc non Af Amer 52 (*)    All other components within normal limits  LIPASE, BLOOD  I-STAT TROPONIN, ED  I-STAT TROPONIN, ED  I-STAT TROPONIN, ED    EKG  EKG Interpretation  Date/Time:  Monday January 17 2017 12:06:24 EST Ventricular Rate:  63 PR Interval:    QRS Duration: 103 QT Interval:  468 QTC Calculation: 480 R Axis:   32 Text Interpretation:  Sinus rhythm Prolonged PR interval Abnormal R-wave progression, early transition Confirmed by Tanna Furry [phone]) on 01/17/2017 1:40:48 PM       Radiology Dg Chest 2 View  Result Date: 01/17/2017 CLINICAL DATA:  Shortness of Breath  EXAM: CHEST  2 VIEW COMPARISON:  October 16, 2013 FINDINGS: The lungs are clear. The heart size and pulmonary vascularity are normal. No adenopathy. There is aortic atherosclerosis. No evident bone lesions. IMPRESSION: Aortic atherosclerosis.  No edema or consolidation. Aortic Atherosclerosis (ICD10-I70.0). Electronically Signed   By: Lowella Grip III M.D.   On: 01/17/2017 11:27    Procedures Procedures (including critical care time)  Medications Ordered in ED Medications  pantoprazole (PROTONIX) EC tablet 40 mg (not administered)     Initial Impression / Assessment and Plan / ED Course  I have reviewed the triage vital signs and the nursing notes.  Pertinent labs & imaging results that were available during my care of the patient were reviewed by me and considered in my medical decision making (see chart for details).   14:12:  First troponin normal. I reviewed EKG with Dr. Debara Pickett of cardiology. He is in agreement that these appear to be chronic changes likely related to his LVH. I related a story of the patient with symptoms that sound inconsistent with cardiac  etiology.  Plan is Delta troponin. If normal will refer her to cardiology for additional outpatient testing as needed. Will start on PPI.  Final Clinical Impressions(s) / ED Diagnoses   Final diagnoses:  Epigastric pain      ED Discharge Orders        Ordered    omeprazole (PRILOSEC) 20 MG capsule  2 times daily     01/17/17 1534       Tanna Furry, MD 01/17/17 1536    Tanna Furry, MD 02/09/17 2245    Tanna Furry, MD 04/09/17 [phone]

## 2019-05-17 ENCOUNTER — Ambulatory Visit: Payer: Medicare Other | Attending: Internal Medicine

## 2019-05-17 DIAGNOSIS — Z23 Encounter for immunization: Secondary | ICD-10-CM

## 2019-05-17 NOTE — Progress Notes (Signed)
   Covid-19 Vaccination Clinic  Name:  William Good    MRN: [id number] DOB: 20-Nov-1955  05/17/2019  [name] was observed post Covid-19 immunization for 15 minutes without incident. He was provided with Vaccine Information Sheet and instruction to access the V-Safe system.   [name] was instructed to call 911 with any severe reactions post vaccine: [name] Difficulty breathing  . Swelling of face and throat  . A fast heartbeat  . A bad rash all over body  . Dizziness and weakness   Immunizations Administered    Name Date Dose VIS Date Route   Moderna COVID-19 Vaccine 05/17/2019  9:55 AM 0.5 mL 02/06/2019 Intramuscular   Manufacturer: Moderna   Lot: [id number]   [place]:[id number]

## 2019-06-20 ENCOUNTER — Ambulatory Visit: Payer: Medicare Other | Attending: Internal Medicine

## 2019-06-20 DIAGNOSIS — Z23 Encounter for immunization: Secondary | ICD-10-CM

## 2019-06-20 NOTE — Progress Notes (Signed)
   Covid-19 Vaccination Clinic  Name:  William Good    MRN: [id number] DOB: 01-10-1956  06/20/2019  [name] was observed post Covid-19 immunization for 15 minutes without incident. He was provided with Vaccine Information Sheet and instruction to access the V-Safe system.   [name] was instructed to call 911 with any severe reactions post vaccine: [name] Difficulty breathing  . Swelling of face and throat  . A fast heartbeat  . A bad rash all over body  . Dizziness and weakness   Immunizations Administered    Name Date Dose VIS Date Route   Moderna COVID-19 Vaccine 06/20/2019  9:02 AM 0.5 mL 02/06/2019 Intramuscular   Manufacturer: Moderna   Lot: [id number]   [place]:[id number]

## 2020-05-01 DIAGNOSIS — B351 Tinea unguium: Secondary | ICD-10-CM | POA: Diagnosis not present

## 2020-05-01 DIAGNOSIS — M79676 Pain in unspecified toe(s): Secondary | ICD-10-CM | POA: Diagnosis not present

## 2020-08-16 DIAGNOSIS — G43009 Migraine without aura, not intractable, without status migrainosus: Secondary | ICD-10-CM | POA: Diagnosis not present

## 2020-08-16 DIAGNOSIS — I10 Essential (primary) hypertension: Secondary | ICD-10-CM | POA: Diagnosis not present

## 2020-08-16 DIAGNOSIS — E785 Hyperlipidemia, unspecified: Secondary | ICD-10-CM | POA: Diagnosis not present

## 2020-08-16 DIAGNOSIS — C20 Malignant neoplasm of rectum: Secondary | ICD-10-CM | POA: Diagnosis not present

## 2020-08-16 DIAGNOSIS — K219 Gastro-esophageal reflux disease without esophagitis: Secondary | ICD-10-CM | POA: Diagnosis not present

## 2020-08-16 DIAGNOSIS — N183 Chronic kidney disease, stage 3 unspecified: Secondary | ICD-10-CM | POA: Diagnosis not present

## 2020-08-16 DIAGNOSIS — C189 Malignant neoplasm of colon, unspecified: Secondary | ICD-10-CM | POA: Diagnosis not present

## 2020-08-16 DIAGNOSIS — E1159 Type 2 diabetes mellitus with other circulatory complications: Secondary | ICD-10-CM | POA: Diagnosis not present

## 2020-08-21 DIAGNOSIS — Z0000 Encounter for general adult medical examination without abnormal findings: Secondary | ICD-10-CM | POA: Diagnosis not present

## 2020-08-21 DIAGNOSIS — Z1211 Encounter for screening for malignant neoplasm of colon: Secondary | ICD-10-CM | POA: Diagnosis not present

## 2020-08-21 DIAGNOSIS — K219 Gastro-esophageal reflux disease without esophagitis: Secondary | ICD-10-CM | POA: Diagnosis not present

## 2020-08-21 DIAGNOSIS — N183 Chronic kidney disease, stage 3 unspecified: Secondary | ICD-10-CM | POA: Diagnosis not present

## 2020-08-21 DIAGNOSIS — E785 Hyperlipidemia, unspecified: Secondary | ICD-10-CM | POA: Diagnosis not present

## 2020-08-21 DIAGNOSIS — E1159 Type 2 diabetes mellitus with other circulatory complications: Secondary | ICD-10-CM | POA: Diagnosis not present

## 2020-08-21 DIAGNOSIS — Z79899 Other long term (current) drug therapy: Secondary | ICD-10-CM | POA: Diagnosis not present

## 2020-08-21 DIAGNOSIS — I10 Essential (primary) hypertension: Secondary | ICD-10-CM | POA: Diagnosis not present

## 2020-08-25 DIAGNOSIS — R109 Unspecified abdominal pain: Secondary | ICD-10-CM | POA: Diagnosis not present

## 2020-08-25 DIAGNOSIS — R198 Other specified symptoms and signs involving the digestive system and abdomen: Secondary | ICD-10-CM | POA: Diagnosis not present

## 2020-08-25 DIAGNOSIS — K6289 Other specified diseases of anus and rectum: Secondary | ICD-10-CM | POA: Diagnosis not present

## 2020-08-25 DIAGNOSIS — C20 Malignant neoplasm of rectum: Secondary | ICD-10-CM | POA: Diagnosis not present

## 2020-08-25 DIAGNOSIS — K641 Second degree hemorrhoids: Secondary | ICD-10-CM | POA: Diagnosis not present

## 2020-08-26 ENCOUNTER — Other Ambulatory Visit: Payer: Self-pay | Admitting: Surgery

## 2020-08-26 ENCOUNTER — Other Ambulatory Visit (HOSPITAL_COMMUNITY): Payer: Self-pay | Admitting: Surgery

## 2020-08-26 DIAGNOSIS — R109 Unspecified abdominal pain: Secondary | ICD-10-CM

## 2020-08-29 ENCOUNTER — Other Ambulatory Visit: Payer: Self-pay

## 2020-08-29 ENCOUNTER — Ambulatory Visit (HOSPITAL_COMMUNITY)
Admission: RE | Admit: 2020-08-29 | Discharge: 2020-08-29 | Disposition: A | Discharge status: Home / Self Care | Payer: Medicare Other | Source: Ambulatory Visit | Attending: Surgery | Admitting: Surgery

## 2020-08-29 DIAGNOSIS — R109 Unspecified abdominal pain: Secondary | ICD-10-CM | POA: Diagnosis not present

## 2020-08-29 DIAGNOSIS — R1110 Vomiting, unspecified: Secondary | ICD-10-CM | POA: Diagnosis not present

## 2020-08-29 DIAGNOSIS — K760 Fatty (change of) liver, not elsewhere classified: Secondary | ICD-10-CM | POA: Diagnosis not present

## 2020-08-29 MED ORDER — SODIUM CHLORIDE (PF) 0.9 % IJ SOLN
INTRAMUSCULAR | Status: AC
  Filled 2020-08-29: qty 50

## 2020-08-29 MED ORDER — IOHEXOL 300 MG/ML  SOLN
100.0000 mL | Freq: Once | INTRAMUSCULAR | Status: AC | PRN
  Administered 2020-08-29: 100 mL via INTRAVENOUS

## 2020-09-02 DIAGNOSIS — K6289 Other specified diseases of anus and rectum: Secondary | ICD-10-CM | POA: Diagnosis not present

## 2020-09-02 DIAGNOSIS — R194 Change in bowel habit: Secondary | ICD-10-CM | POA: Diagnosis not present

## 2020-09-05 DIAGNOSIS — D123 Benign neoplasm of transverse colon: Secondary | ICD-10-CM | POA: Diagnosis not present

## 2020-09-05 DIAGNOSIS — D128 Benign neoplasm of rectum: Secondary | ICD-10-CM | POA: Diagnosis not present

## 2020-09-05 DIAGNOSIS — D124 Benign neoplasm of descending colon: Secondary | ICD-10-CM | POA: Diagnosis not present

## 2020-09-05 DIAGNOSIS — D122 Benign neoplasm of ascending colon: Secondary | ICD-10-CM | POA: Diagnosis not present

## 2020-09-05 DIAGNOSIS — C189 Malignant neoplasm of colon, unspecified: Secondary | ICD-10-CM | POA: Diagnosis not present

## 2020-09-05 DIAGNOSIS — D175 Benign lipomatous neoplasm of intra-abdominal organs: Secondary | ICD-10-CM | POA: Diagnosis not present

## 2020-09-05 DIAGNOSIS — R194 Change in bowel habit: Secondary | ICD-10-CM | POA: Diagnosis not present

## 2020-09-05 DIAGNOSIS — D125 Benign neoplasm of sigmoid colon: Secondary | ICD-10-CM | POA: Diagnosis not present

## 2020-09-05 DIAGNOSIS — Z8371 Family history of colonic polyps: Secondary | ICD-10-CM | POA: Diagnosis not present

## 2020-09-05 DIAGNOSIS — D120 Benign neoplasm of cecum: Secondary | ICD-10-CM | POA: Diagnosis not present

## 2020-09-05 DIAGNOSIS — K6289 Other specified diseases of anus and rectum: Secondary | ICD-10-CM | POA: Diagnosis not present

## 2020-09-05 DIAGNOSIS — C20 Malignant neoplasm of rectum: Secondary | ICD-10-CM | POA: Diagnosis not present

## 2020-09-05 DIAGNOSIS — K635 Polyp of colon: Secondary | ICD-10-CM | POA: Diagnosis not present

## 2020-09-11 DIAGNOSIS — D123 Benign neoplasm of transverse colon: Secondary | ICD-10-CM | POA: Diagnosis not present

## 2020-09-11 DIAGNOSIS — K635 Polyp of colon: Secondary | ICD-10-CM | POA: Diagnosis not present

## 2020-09-11 DIAGNOSIS — D120 Benign neoplasm of cecum: Secondary | ICD-10-CM | POA: Diagnosis not present

## 2020-09-11 DIAGNOSIS — D124 Benign neoplasm of descending colon: Secondary | ICD-10-CM | POA: Diagnosis not present

## 2020-09-11 DIAGNOSIS — D122 Benign neoplasm of ascending colon: Secondary | ICD-10-CM | POA: Diagnosis not present

## 2020-09-11 DIAGNOSIS — D125 Benign neoplasm of sigmoid colon: Secondary | ICD-10-CM | POA: Diagnosis not present

## 2020-09-11 DIAGNOSIS — D128 Benign neoplasm of rectum: Secondary | ICD-10-CM | POA: Diagnosis not present

## 2020-09-11 DIAGNOSIS — C189 Malignant neoplasm of colon, unspecified: Secondary | ICD-10-CM | POA: Diagnosis not present

## 2020-09-16 ENCOUNTER — Telehealth: Payer: Self-pay | Admitting: Radiation Oncology

## 2020-09-16 ENCOUNTER — Encounter: Payer: Self-pay | Admitting: Surgery

## 2020-09-16 ENCOUNTER — Telehealth: Payer: Self-pay | Admitting: Oncology

## 2020-09-16 DIAGNOSIS — G43909 Migraine, unspecified, not intractable, without status migrainosus: Secondary | ICD-10-CM | POA: Insufficient documentation

## 2020-09-16 DIAGNOSIS — C20 Malignant neoplasm of rectum: Secondary | ICD-10-CM | POA: Insufficient documentation

## 2020-09-16 DIAGNOSIS — K641 Second degree hemorrhoids: Secondary | ICD-10-CM | POA: Insufficient documentation

## 2020-09-16 NOTE — Telephone Encounter (Signed)
Received a new patient referral from Dr. Alessandra Bevels for rectal cancer. William Good has been scheduled to see Dr. Benay Spice on 7/18 at 1:40pm. Appt date and time has been given to the pt's wife. Aware to arrive 20 minutes early.

## 2020-09-17 ENCOUNTER — Other Ambulatory Visit: Payer: Self-pay | Admitting: Surgery

## 2020-09-17 ENCOUNTER — Other Ambulatory Visit (HOSPITAL_COMMUNITY): Payer: Self-pay | Admitting: Surgery

## 2020-09-17 ENCOUNTER — Telehealth: Payer: Self-pay | Admitting: *Deleted

## 2020-09-17 ENCOUNTER — Telehealth: Payer: Self-pay | Admitting: Radiation Oncology

## 2020-09-17 DIAGNOSIS — C20 Malignant neoplasm of rectum: Secondary | ICD-10-CM

## 2020-09-17 NOTE — Telephone Encounter (Signed)
Called home and spoke w/brother-in-law (reports being his POA and takes him to appointments) to inquire if he would like to come tomorrow instead of Monday. They prefer to stick w/Monday as scheduled.

## 2020-09-19 ENCOUNTER — Telehealth: Payer: Self-pay | Admitting: *Deleted

## 2020-09-19 ENCOUNTER — Other Ambulatory Visit: Payer: Self-pay | Admitting: Surgery

## 2020-09-19 DIAGNOSIS — C20 Malignant neoplasm of rectum: Secondary | ICD-10-CM

## 2020-09-19 NOTE — Telephone Encounter (Signed)
Left VM for his brother to confirm appointment 7/18 at 1:40 and arrival 15 minutes early. Left directions to office from parking deck and visitor policy. Requested call early Monday if unable to come to appointment.

## 2020-09-22 ENCOUNTER — Other Ambulatory Visit: Payer: Self-pay

## 2020-09-22 ENCOUNTER — Inpatient Hospital Stay: Payer: Medicare Other | Attending: Oncology | Admitting: Oncology

## 2020-09-22 ENCOUNTER — Ambulatory Visit (HOSPITAL_BASED_OUTPATIENT_CLINIC_OR_DEPARTMENT_OTHER)
Admission: RE | Admit: 2020-09-22 | Discharge: 2020-09-22 | Disposition: A | Discharge status: Home / Self Care | Payer: Medicare Other | Source: Ambulatory Visit | Attending: Surgery | Admitting: Surgery

## 2020-09-22 VITALS — BP 115/81 | HR 71 | Temp 97.9°F | Resp 18 | Wt 242.0 lb

## 2020-09-22 DIAGNOSIS — E559 Vitamin D deficiency, unspecified: Secondary | ICD-10-CM | POA: Diagnosis not present

## 2020-09-22 DIAGNOSIS — N189 Chronic kidney disease, unspecified: Secondary | ICD-10-CM | POA: Diagnosis not present

## 2020-09-22 DIAGNOSIS — E119 Type 2 diabetes mellitus without complications: Secondary | ICD-10-CM | POA: Insufficient documentation

## 2020-09-22 DIAGNOSIS — E1122 Type 2 diabetes mellitus with diabetic chronic kidney disease: Secondary | ICD-10-CM | POA: Diagnosis not present

## 2020-09-22 DIAGNOSIS — I10 Essential (primary) hypertension: Secondary | ICD-10-CM | POA: Insufficient documentation

## 2020-09-22 DIAGNOSIS — C20 Malignant neoplasm of rectum: Secondary | ICD-10-CM

## 2020-09-22 DIAGNOSIS — J439 Emphysema, unspecified: Secondary | ICD-10-CM | POA: Diagnosis not present

## 2020-09-22 DIAGNOSIS — I700 Atherosclerosis of aorta: Secondary | ICD-10-CM | POA: Diagnosis not present

## 2020-09-22 MED ORDER — IOHEXOL 300 MG/ML  SOLN
75.0000 mL | Freq: Once | INTRAMUSCULAR | Status: AC | PRN
  Administered 2020-09-22: 75 mL via INTRAVENOUS

## 2020-09-22 NOTE — Progress Notes (Signed)
GI Location of Tumor / Histology: Rectal Cancer  William Good presented in November 2021 with complaints of rectal bleeding and constipation.  MRI Pelvis 09/24/2020:  CT Chest 09/22/2020:  Colonoscopy 09/05/2020: Ulcerated non-obstructing mass was found in the mid rectum.  Multiple polyps were removed from the colon.  Internal hemorrhoids were noted.  CT AP 08/29/2020: Small amount of posterior free pelvic fluid of uncertain etiology.  A 13 mm left external iliac node is indeterminate.   Biopsies of Rectal Mass 09/05/2020     Past/Anticipated interventions by surgeon, if any:    Past/Anticipated interventions by medical oncology, if any:  Dr. Benay Spice 09/22/2020 4:40 pm William Good has been diagnosed with rectal cancer.  There is no clinical or radiologic evidence of distant metastatic disease.  He is scheduled undergo a staging chest CT later today and a pelvic MRI on 09/24/2020.  I discussed the general management of rectal cancer with William Good and his brother-in-law.  He understands treatment will include a multidisciplinary approach.  His case will be presented at the GI tumor conference this week.  We will decide on upfront surgery, neoadjuvant capecitabine/radiation, or total neoadjuvant therapy based on the MRI result.  He will return for an office visit and further discussion on 09/29/2020.   Weight changes, if any: Stable  Bowel/Bladder complaints, if any: Bowels fluctuates between constipation and diarrhea.  Nausea / Vomiting, if any: No  Pain issues, if any: Notes pain with passing of stool.    Any blood per rectum: Has some bleeding with stool, noted in the toilet bowl.    SAFETY ISSUES: Prior radiation? No Pacemaker/ICD? No Possible current pregnancy? n/a Is the patient on methotrexate? No  Current Complaints/Details:

## 2020-09-22 NOTE — Progress Notes (Signed)
[facility] New Patient Consult   Requesting MD: Aura Dials, [address] [place],  [place] [address]   William Good 65 y.o.  Apr 18, 1955    Reason for Consult: Rectal cancer   HPI: William Good reports a history of rectal bleeding since November 2021.  He underwent hemorrhoid banding by Dr. Johney Maine in 2021.  He has experienced an increase in rectal bleeding and constipation.  He saw Dr. Johney Maine on 08/25/2020.  A rectal mass was noted on digital exam getting at 6 cm from the sphincters.  The mass was noted to be friable and bleeding.  He was referred to Dr. Alessandra Bevels for colonoscopy on 09/05/2020.  Multiple polyps were removed from the colon.  An ulcerated nonobstructing mass was found in the mid rectum.  The mass was biopsied and tattooed.  Internal hemorrhoids were noted. The pathology revealed multiple tubular adenomas including a tubular adenoma with high-grade dysplasia of the rectum.  The polypectomy margin was negative for dysplasia.  The rectal mass biopsy revealed invasive moderately differentiated adenocarcinoma with no loss of mismatch repair protein expression. A CT of the abdomen pelvis on 08/29/2020 revealed a small amount of posterior free pelvic fluid of uncertain etiology.  A 13 mm left external iliac node is indeterminate.  Cystic changes of the left kidney with hydronephrosis and transition at the left UPJ suggesting a chronic left UPJ obstruction.  He is scheduled for a chest CT later today and a pelvic MRI on 09/24/2020.  He will see Dr. Lisbeth Renshaw tomorrow.  Past Medical History:  Diagnosis Date   Hyperlipidemia    Hypertension    Vitamin D deficiency    Migraine headaches     .  Bipolar disorder   .  Restless leg syndrome   .  Chronic kidney disease   .  Type 2 diabetes  Past Surgical History:  Procedure Laterality Date   BACK SURGERY     JOINT REPLACEMENT-right knee      Medications: Reviewed  Allergies: No Known Allergies  Family  history: No family history of cancer  Social History:   He lives alone in [place].  He is retired from a lower Physicist, medical.  He smokes 1 pack of cigarettes every 2 to 3 days.  No alcohol use.  No risk factor for HIV or hepatitis.  No transfusion history.  He has received COVID-19 vaccines.  ROS:   Positives include: Constipation, rectal bleeding, low abdominal pain-constant  A complete ROS was otherwise negative.  Physical Exam:  Blood pressure 115/81, pulse 71, temperature 97.9 F (36.6 C), temperature source Tympanic, resp. rate 18, weight 242 lb (109.8 kg), SpO2 98 %. Lungs: Distant breath sounds with scattered wheeze and rhonchi, no respiratory distress Cardiac: Distant heart sounds Abdomen: Nontender, no mass, no hepatosplenomegaly GU: Slight enlargement of the left compared to the right testicle-hydrocele?,  No discrete mass Vascular: No leg edema Lymph nodes: No cervical, supraclavicular, axillary, or inguinal nodes Neurologic: Alert and oriented, the motor exam appears intact in the upper and lower extremities bilaterally Skin: No rash Musculoskeletal: No spine tenderness   LAB:  CBC 08/25/2020: Hemoglobin 0.1, WBC 9.2, platelets 271,000, ANC 5.53     CMP 08/25/2020-BUN 11, creatinine 1.44, bilirubin 0.6, alkaline phosphatase 93, AST 27, ALT 24  CEA on 08/25/2020-31.6  Imaging: As per HPI, CT abdomen/pelvis 08/29/2020, images reviewed   Assessment/Plan:   Rectal cancer Mass at 6 cm on digital exam/anoscopy 08/25/2020 Colonoscopy 09/05/2020-mid rectal mass-biopsy,  adenocarcinoma, no loss of mismatch repair protein expression CT abdomen/pelvis 08/29/2020-small amount of free fluid in the pelvis, mildly enlarged left external iliac node, cystic changes of the left kidney with hydronephrosis and regions of cortical thinning with transition at the left UPJ suggesting chronic left UPJ obstruction  Diabetes Chronic kidney disease Bipolar  disorder Hypertension Hyperlipidemia Restless leg syndrome Multiple colon polyps removed on the colonoscopy 09/05/2020-tubular adenomas including a rectal polyp with high-grade dysplasia-polypectomy margin negative for dysplasia   Disposition:   William Good has been diagnosed with rectal cancer.  There is no clinical or radiologic evidence of distant metastatic disease.  He is scheduled undergo a staging chest CT later today and a pelvic MRI on 09/24/2020.  I discussed the general management of rectal cancer with William. Good and his brother-in-law.  He understands treatment will include a multidisciplinary approach.  His case will be presented at the GI tumor conference this week.  We will decide on upfront surgery, neoadjuvant capecitabine/radiation, or total neoadjuvant therapy based on the MRI result.  He will return for an office visit and further discussion on 09/29/2020.   , MD  09/22/2020, 3:03 PM    

## 2020-09-22 NOTE — Patient Instructions (Signed)
For constipation:  Start MiraLax 17 grams in 8 ounces liquid daily  Colace (Docusate sodium) 100 mg capsule--take #1 twice daily (may increase to #2 twice daily if needed) Both are over-the-counter

## 2020-09-22 NOTE — Progress Notes (Deleted)
GI Location of Tumor / Histology: Rectal Cancer  Satira Anis Bufford presented   Biopsies of    Past/Anticipated interventions by surgeon, if any:   Past/Anticipated interventions by medical oncology, if any:  Dr. Benay Spice 09/22/2020 4:40 pm -   Weight changes, if any:   Bowel/Bladder complaints, if any:   Nausea / Vomiting, if any:   Pain issues, if any:    Any blood per rectum:     SAFETY ISSUES: Prior radiation?  Pacemaker/ICD?  Possible current pregnancy?  Is the patient on methotrexate?   Current Complaints/Details:

## 2020-09-23 ENCOUNTER — Telehealth: Payer: Self-pay | Admitting: Nutrition

## 2020-09-23 ENCOUNTER — Ambulatory Visit
Admission: RE | Admit: 2020-09-23 | Discharge: 2020-09-23 | Disposition: A | Discharge status: Home / Self Care | Payer: Medicare Other | Source: Ambulatory Visit | Attending: Radiation Oncology | Admitting: Radiation Oncology

## 2020-09-23 ENCOUNTER — Other Ambulatory Visit: Payer: Self-pay

## 2020-09-23 ENCOUNTER — Encounter: Payer: Self-pay | Admitting: *Deleted

## 2020-09-23 ENCOUNTER — Encounter: Payer: Self-pay | Admitting: Radiation Oncology

## 2020-09-23 VITALS — Ht 72.0 in | Wt 242.0 lb

## 2020-09-23 DIAGNOSIS — C20 Malignant neoplasm of rectum: Secondary | ICD-10-CM

## 2020-09-23 HISTORY — DX: Malignant neoplasm of rectum: C20

## 2020-09-23 NOTE — Progress Notes (Signed)
Radiation Oncology         [phone] ________________________________  Name: William Good        MRN: [id number]  Date of Service: 09/23/2020 DOB: Oct 01, 1955  CC:William Dials, MD  Michael Boston, MD     REFERRING PHYSICIAN: Michael Boston, MD   DIAGNOSIS: The encounter diagnosis was Rectal cancer [facility]   HISTORY OF PRESENT ILLNESS: William Good is a 65 y.o. male seen at the request of Dr. Johney Maine for a new diagnosis of rectal cancer.  Rectal bleeding 2021 underwent banding of hemorrhoids with Dr. Johney Maine last year.  An increase in bleeding constipation occurred in the Dr. Johney Maine in June 2022 digital exam but mass was palpable.  He was referred for colonoscopy and underwent this procedure on 09/05/2020 which identified multiple polyps throughout the colon as well as a large mass in the rectum, multiple biopsies were taken of the polyps and these were consistent with tubular adenomas however the rectal mass was consistent with invasive moderately differentiated adenocarcinoma. He underwent CT staging, and the images of the abdomen and pelvis on 08/29/2020 that he presented prior to his procedure with Dr. Rosalie Gums showed mildly enlarged left external iliac lymph node a small amount of free fluid posteriorly in the pelvis, cystic changes in the right kidney as well as in the left kidney and atherosclerotic disease.  CT of the chest yesterday is pending radiology read.  He is scheduled for an MRI of the pelvis tomorrow.  He met with Dr. Benay Spice yesterday.  He's seen today to discuss treatment recommendations of his cancer.    PREVIOUS RADIATION THERAPY: No   PAST MEDICAL HISTORY:  Past Medical History:  Diagnosis Date   Back pain    Hypertension    Knee pain    Migraine    Rectal cancer ([place])        PAST SURGICAL HISTORY: Past Surgical History:  Procedure Laterality Date   BACK SURGERY     JOINT REPLACEMENT       FAMILY HISTORY:  Family History  Problem Relation  Age of Onset   ALS Mother    Heart failure Father      SOCIAL HISTORY:  reports that he has been smoking cigarettes. He has been smoking an average of .5 packs per day. He has never used smokeless tobacco. He reports that he does not drink alcohol and does not use drugs.  The patient is divorced and lives in [place].  He is retired from working for a [employer].   ALLERGIES: Patient has no known allergies.   MEDICATIONS:  Current Outpatient Medications  Medication Sig Dispense Refill   acetaminophen (TYLENOL) 325 MG tablet Take 650 mg every 4 (four) hours as needed by mouth for mild pain.     ALPRAZolam (XANAX) 1 MG tablet Take 1 mg 2 (two) times daily by mouth. 2 mg in the morning and 2 mg in the evening     aspirin EC 81 MG tablet Take 81 mg by mouth daily. Swallow whole.     benztropine (COGENTIN) 1 MG tablet Take 1 mg at bedtime by mouth.     Cholecalciferol (VITAMIN D3) 50 MCG (2000 UT) capsule      DM-Doxylamine-Acetaminophen (NYQUIL COLD & FLU PO) Take 20 mLs daily as needed by mouth (cold/flu symptoms).     LOTENSIN HCT 20-12.5 MG per tablet TAKE (2) TABLETS DAILY 60 each 0   mirtazapine (REMERON) 45 MG tablet Take 45 mg by mouth at bedtime.  omeprazole (PRILOSEC) 20 MG capsule Take 1 capsule (20 mg total) 2 (two) times daily by mouth. 60 capsule 0   pravastatin (PRAVACHOL) 40 MG tablet Take 40 mg by mouth daily.     propranolol ER (INDERAL LA) 160 MG SR capsule Take 160 mg daily by mouth.     risperiDONE (RISPERDAL) 0.5 MG tablet      SUMAtriptan (IMITREX) 100 MG tablet Take 50-100 mg by mouth 2 (two) times daily as needed for migraine.     risperiDONE (RISPERDAL) 3 MG tablet Take 0.5 mg by mouth at bedtime.     No current facility-administered medications for this encounter.     REVIEW OF SYSTEMS: On review of systems, the patient reports that he does have rectal bleeding with constipated stools. He denies any unintended weight loss, nausea or vomiting. He does  have rectal pain and has to change positions due to his symptoms. He also has constipation that fluctuates with diarrhea. No other complaints are verbalized.      PHYSICAL EXAM:  Wt Readings from Last 3 Encounters:  09/23/20 242 lb (109.8 kg)  09/22/20 242 lb (109.8 kg)  01/17/17 250 lb (113.4 kg)   Temp Readings from Last 3 Encounters:  09/22/20 97.9 F (36.6 C) (Tympanic)  01/17/17 98.5 F (36.9 C) (Oral)   BP Readings from Last 3 Encounters:  09/22/20 115/81  01/17/17 132/89   Pulse Readings from Last 3 Encounters:  09/22/20 71  01/17/17 88   Pain Assessment Pain Score: 0-No pain/10  In general this is a well appearing caucasian male in no acute distress. He's alert and oriented x4 and appropriate throughout the examination. Cardiopulmonary assessment is negative for acute distress and he exhibits normal effort.     ECOG = 1  0 - Asymptomatic (Fully active, able to carry on all predisease activities without restriction)  1 - Symptomatic but completely ambulatory (Restricted in physically strenuous activity but ambulatory and able to carry out work of a light or sedentary nature. For example, light housework, office work)  2 - Symptomatic, <50% in bed during the day (Ambulatory and capable of all self care but unable to carry out any work activities. Up and about more than 50% of waking hours)  3 - Symptomatic, >50% in bed, but not bedbound (Capable of only limited self-care, confined to bed or chair 50% or more of waking hours)  4 - Bedbound (Completely disabled. Cannot carry on any self-care. Totally confined to bed or chair)  5 - Death   Eustace Pen MM, Creech RH, Tormey DC, et al. [phone]). "Toxicity and response criteria of the [facility]". [place] Oncol. 5 (6): 649-55    LABORATORY DATA:  Lab Results  Component Value Date   WBC 8.4 01/17/2017   HGB 15.7 01/17/2017   HCT 45.4 01/17/2017   MCV 100.0 01/17/2017   PLT 145 (L) 01/17/2017    Lab Results  Component Value Date   NA 138 01/17/2017   K 3.8 01/17/2017   CL 102 01/17/2017   CO2 27 01/17/2017   Lab Results  Component Value Date   ALT 33 01/17/2017   AST 40 01/17/2017   ALKPHOS 81 01/17/2017   BILITOT 0.4 01/17/2017      RADIOGRAPHY: CT ABDOMEN PELVIS W CONTRAST  Result Date: 08/31/2020 CLINICAL DATA:  Abdominal pain.  Nausea and vomiting.  Constipation. EXAM: CT ABDOMEN AND PELVIS WITH CONTRAST TECHNIQUE: Multidetector CT imaging of the abdomen and pelvis was performed using the  standard protocol following bolus administration of intravenous contrast. CONTRAST:  151m OMNIPAQUE IOHEXOL 300 MG/ML  SOLN COMPARISON:  None. FINDINGS: Lower chest: No acute abnormality. Hepatobiliary: Hepatic steatosis. The liver, portal vein, and gallbladder are otherwise unremarkable. Pancreas: Unremarkable. No pancreatic ductal dilatation or surrounding inflammatory changes. Spleen: Normal in size without focal abnormality. Adrenals/Urinary Tract: There is a fat containing mass in the right adrenal gland consistent with a myelolipoma of no significance. The adrenal glands are otherwise normal. There is a large dominant cyst associated with the right kidney which extends into the right renal pelvis. A few other smaller cysts are identified. No suspicious masses. Two stones are seen in the right kidney. The largest measures 5 mm on coronal image 81. No hydronephrosis or perinephric stranding. Delayed imaging demonstrates no pelvicaliectasis or filling defects in the opacified portions of the upper collecting system. The ureter is normal. The bladder is normal. Multiple cysts are so she aided with the left kidney. There also appears to be pelvicaliectasis on the left with regions of cortical thinning. There is only minimal excretion on the left on delayed imaging making it difficult to differentiate between cysts and dilated calices. The left ureter is normal. Stomach/Bowel: Stomach is within  normal limits. Appendix appears normal. No evidence of bowel wall thickening, distention, or inflammatory changes. Vascular/Lymphatic: Calcified atherosclerosis is seen in the abdominal aorta. There is also soft plaque in the right common iliac artery resulting in narrowing of the diameter to 6 mm. There is a prominent left external iliac lymph node measuring up to 13 mm in short axis on series 2, image 80. This is asymmetric to the right iliac chain. No other adenopathy. Reproductive: Prostate is unremarkable. Other: There is a fat containing umbilical hernia. No free air. There is a small amount of fluid posteriorly in the pelvis on series 2, image 87. No free air. Musculoskeletal: No acute or significant osseous findings. IMPRESSION: 1. There is a small amount of free fluid posteriorly in the pelvis of uncertain etiology. 2. There is a mildly enlarged left external iliac lymph node which could be reactive. A neoplastic cause is not excluded on this study. Recommend attention on follow-up. No other adenopathy. 3. There is a large dominant cyst in the right kidney extending into the right renal pelvis without right-sided hydronephrosis. Two nonobstructive stones are seen on the right. 4. There is cystic changes associated with the left kidney as well. However, I believe there is also hydronephrosis with regions of cortical thinning and an abrupt transition at the left UPJ. The findings on the left suggest a chronic left UPJ obstruction. 5. Calcified atherosclerosis in the abdominal aorta, extending into the iliac vessels. There is relative narrowing due to soft plaque in the right common iliac artery with an opacified AP diameter of 6 mm. Electronically Signed   By: DDorise BullionIII M.D   On: 08/31/2020 20:09       IMPRESSION/PLAN: 1. Adenocarcinoma of the rectum, stage unknown. Dr. MLisbeth Renshawdiscusses the pathology findings and reviews the nature of rectal disease. Dr. MLisbeth Renshawoutlines that the MRI will help  confirm his stage. If he has lymph node involvement, he would likely proceed with total neoadjuvant chemotherapy followed by chemoRT, and surgery. He is also aware that if the tumor is small and early stage by MRI upfront surgery could occur, versus chemoRT, surgery and adjuvant chemotherapy. We discussed the risks, benefits, short, and long term effects of radiotherapy, as well as the curative intent, and the  patient is interested in proceeding at the appropriate time and pathway. Dr. Lisbeth Renshaw discusses the delivery and logistics of radiotherapy and anticipates a course of 5 1/2 weeks of radiotherapy. We will follow up with his MRI and make plans accordingly for the next steps in treatment.  This encounter was provided by telemedicine platform MyChart.  The patient has provided two factor identification and has given verbal consent for this type of encounter and has been advised to only accept a meeting of this type in a secure network environment. The time spent during this encounter was 60 minutes including preparation, discussion, and coordination of the patient's care. The attendants for this meeting include Blenda Nicely, RN, Dr. Lisbeth Renshaw, Hayden Pedro  and [name].  During the encounter,  Blenda Nicely, RN, Dr. Lisbeth Renshaw, and Hayden Pedro were located at [facility] Radiation Oncology Department.  Westin Knotts Kraemer was located at home.     The above documentation reflects my direct findings during this shared patient visit. Please see the separate note by Dr. Lisbeth Renshaw on this date for the remainder of the patient's plan of care.    Carola Rhine, [facility]   **Disclaimer: This note was dictated with voice recognition software. Similar sounding words can inadvertently be transcribed and this note may contain transcription errors which may not have been corrected upon publication of note.**

## 2020-09-23 NOTE — Progress Notes (Signed)
[place] Work  Clinical Social Work received referral from medical oncology for a new diagnosis, adjustment to illness, and support resources.  CSW attempted to contacted with phone numbers listed in EMR.  CSW was unable to reach patient, but spoke with patients sister and brother in-law.  Patients brother in-law stated patient had a positive support sytem; which included his sister, brother in-law and brother.  Patient lives alone and his brother lives across the street.  Patients brother in-law reported that patients family manages his medications, appointment and  provides transportation.  CSW provided education on CSW role and support programs at [facility].  CSW provided contact information and encouraged patient and family to call with questions or concerns.  Johnnye Lana, MSW, LCSW, OSW-C Clinical Social Worker [facility] [phone]

## 2020-09-23 NOTE — Telephone Encounter (Signed)
Scheduled appt per 7/19 sch msg. Called pt, no answer. Left msg with appt date and time.

## 2020-09-24 ENCOUNTER — Telehealth: Payer: Self-pay | Admitting: Oncology

## 2020-09-24 ENCOUNTER — Encounter: Payer: Self-pay | Admitting: Surgery

## 2020-09-24 ENCOUNTER — Ambulatory Visit (HOSPITAL_COMMUNITY)
Admission: RE | Admit: 2020-09-24 | Discharge: 2020-09-24 | Disposition: A | Discharge status: Home / Self Care | Payer: Medicare Other | Source: Ambulatory Visit | Attending: Surgery | Admitting: Surgery

## 2020-09-24 ENCOUNTER — Ambulatory Visit: Payer: Medicare Other

## 2020-09-24 ENCOUNTER — Other Ambulatory Visit: Payer: Self-pay

## 2020-09-24 DIAGNOSIS — N182 Chronic kidney disease, stage 2 (mild): Secondary | ICD-10-CM

## 2020-09-24 DIAGNOSIS — N135 Crossing vessel and stricture of ureter without hydronephrosis: Secondary | ICD-10-CM | POA: Insufficient documentation

## 2020-09-24 DIAGNOSIS — N183 Chronic kidney disease, stage 3 unspecified: Secondary | ICD-10-CM | POA: Insufficient documentation

## 2020-09-24 DIAGNOSIS — C20 Malignant neoplasm of rectum: Secondary | ICD-10-CM | POA: Insufficient documentation

## 2020-09-24 HISTORY — DX: Chronic kidney disease, stage 2 (mild): N18.2

## 2020-09-24 NOTE — Telephone Encounter (Signed)
Spoke to [place], pt's relative, who said they want to wait until pt has his tx plan before for scheduling his nutrition appt.

## 2020-09-24 NOTE — Progress Notes (Signed)
The proposed treatment discussed in conference is for discussion purpose only and is not a binding recommendation.  The patients have not been physically examined, or presented with their treatment options.  Therefore, final treatment plans cannot be decided.  

## 2020-09-29 ENCOUNTER — Ambulatory Visit: Payer: Self-pay | Admitting: Surgery

## 2020-09-29 ENCOUNTER — Inpatient Hospital Stay: Payer: Medicare Other | Admitting: Oncology

## 2020-09-29 ENCOUNTER — Other Ambulatory Visit: Payer: Self-pay

## 2020-09-29 VITALS — BP 96/74 | HR 74 | Temp 98.5°F | Resp 18 | Wt 246.0 lb

## 2020-09-29 DIAGNOSIS — C20 Malignant neoplasm of rectum: Secondary | ICD-10-CM

## 2020-09-29 DIAGNOSIS — I10 Essential (primary) hypertension: Secondary | ICD-10-CM | POA: Diagnosis not present

## 2020-09-29 DIAGNOSIS — E119 Type 2 diabetes mellitus without complications: Secondary | ICD-10-CM | POA: Diagnosis not present

## 2020-09-29 NOTE — Progress Notes (Signed)
Please place PAT visit orders scheduled 09/30/20.

## 2020-09-29 NOTE — Progress Notes (Signed)
[place] OFFICE PROGRESS NOTE   Diagnosis: Rectal cancer  INTERVAL HISTORY:   William William Good returns as scheduled.  He is here with his brother-in-law.  His brother is present by telephone for today's visit.  He saw Dr. Lisbeth Renshaw last week.  He is scheduled for Port-A-Cath placement later this week.  He is having bowel movements.  No new complaint.  Objective:  Vital signs in last 24 hours:  Blood pressure 96/74, pulse 74, temperature 98.5 F (36.9 C), temperature source Oral, resp. rate 18, weight 246 lb (111.6 kg), SpO2 97 %.    Resp: Distant breath sounds, no respiratory distress Cardio: Distant heart sounds Vascular: No leg edema Neurologic: Alert and oriented  Lab Results:  Lab Results  Component Value Date   WBC 8.4 01/17/2017   HGB 15.7 01/17/2017   HCT 45.4 01/17/2017   MCV 100.0 01/17/2017   PLT 145 (L) 01/17/2017   NEUTROABS 5.3 01/17/2017    CMP  Lab Results  Component Value Date   NA 138 01/17/2017   K 3.8 01/17/2017   CL 102 01/17/2017   CO2 27 01/17/2017   GLUCOSE 140 (H) 01/17/2017   BUN 11 01/17/2017   CREATININE 1.41 (H) 01/17/2017   CALCIUM 8.6 (L) 01/17/2017   PROT 6.3 (L) 01/17/2017   ALBUMIN 3.6 01/17/2017   AST 40 01/17/2017   ALT 33 01/17/2017   ALKPHOS 81 01/17/2017   BILITOT 0.4 01/17/2017   GFRNONAA 52 (L) 01/17/2017   GFRAA >60 01/17/2017   Medications: I have reviewed the patient's current medications.   Assessment/Plan: Rectal cancer Mass at 6 cm on digital exam/anoscopy 08/25/2020 Colonoscopy 09/05/2020-mid rectal mass-biopsy, adenocarcinoma, no loss of mismatch repair protein expression CT abdomen/pelvis 08/29/2020-small amount of free fluid in the pelvis, mildly enlarged left external iliac node, cystic changes of the left kidney with hydronephrosis and regions of cortical thinning with transition at the left UPJ suggesting chronic left UPJ obstruction MRI pelvis 09/24/2020-tumor 6 cm from the anal verge, 1.8 cm  from the internal anal sphincter, T2, 7 mm lymph node in the left mesorectal sheath, N1  Diabetes Chronic kidney disease Bipolar disorder Hypertension Hyperlipidemia Restless leg syndrome Multiple colon polyps removed on the colonoscopy 09/05/2020-tubular adenomas including a rectal polyp with high-grade dysplasia-polypectomy margin negative for dysplasia     Disposition: William Good has been diagnosed with clinical stage III rectal cancer.  He appears to have early stage III disease based on the staging evaluation to date.  I discussed treatment options with him.  We discussed the role of neoadjuvant capecitabine/radiation and total neoadjuvant therapy with FOLFOX.  He understands the goal of neoadjuvant therapy is to improve the cure rate, make a sphincter sparing surgery more possible, and potentially render him disease-free.  We discussed the possibility of surveillance if he is one of the minority of patients with a complete clinical remission following neoadjuvant therapy.  William Good indicated he would like to proceed with total neoadjuvant therapy.  I recommend 8 cycles of FOLFOX to be followed by capecitabine and radiation.  We discussed potential toxicities associated with the FOLFOX regimen including the chance of nausea/vomiting, mucositis, diarrhea, alopecia, and hematologic toxicity.  We discussed the rash, sun sensitivity, hyperpigmentation, and hand/foot syndrome seen with 5-fluorouracil.  We discussed the allergic reaction and various types of neuropathy associated with oxaliplatin.  He agrees to proceed.  The plan is to begin FOLFOX on 10/08/2020.  A chemotherapy plan was entered today.  Betsy Coder, MD  09/29/2020  9:14 AM

## 2020-09-29 NOTE — Progress Notes (Signed)
START ON PATHWAY REGIMEN - Colorectal     A cycle is every 14 days:     Oxaliplatin      Leucovorin      Fluorouracil      Fluorouracil   **Always confirm dose/schedule in your pharmacy ordering system**  Patient Characteristics: Preoperative or Nonsurgical Candidate (Clinical Staging), Rectal, cT3 - cT4, cN0 or Any cT, cN+ Tumor Location: Rectal Therapeutic Status: Preoperative or Nonsurgical Candidate (Clinical Staging) AJCC T Category: cT2 AJCC N Category: cN1 AJCC M Category: cM0 AJCC 8 Stage Grouping: IIIA Intent of Therapy: Curative Intent, Discussed with Patient

## 2020-09-29 NOTE — Progress Notes (Addendum)
COVID Vaccine Completed: yes x3 Date COVID Vaccine completed: 1/33/21, 06/20/19 Has received booster: November COVID vaccine manufacturer:Moderna     Date of COVID positive in last 66 days:No  PCP - Aura Dials, MD Cardiologist - N/a  Chest x-ray - CT 09/26/20 Epic EKG - 09/30/20 Epic Stress Test - long time ago ECHO - N/a Cardiac Cath - N/a Pacemaker/ICD device last checked: N/a Spinal Cord Stimulator: N/a  Sleep Study - N/a CPAP -   Fasting Blood Sugar - 100s, doesn't check regularly as he is pre DM Checks Blood Sugar _____ times a day  Blood Thinner Instructions: Aspirin Instructions: ASA 81, no instructions. Instructed patient and family member to call and see if PCP wants him to hold. Last Dose:  Activity level: Can go up a flight of stairs and perform activities of daily living without stopping and without symptoms of chest pain or shortness of breath.        Anesthesia review: DM, SOB, HTN, EKG 1st degree AV block at PAT.  Patient denies shortness of breath, fever, cough and chest pain at PAT appointment   Patient verbalized understanding of instructions that were given to them at the PAT appointment. Patient was also instructed that they will need to review over the PAT instructions again at home before surgery.

## 2020-09-29 NOTE — Patient Instructions (Addendum)
DUE TO COVID-19 ONLY ONE VISITOR IS ALLOWED TO COME WITH YOU AND STAY IN THE WAITING ROOM ONLY DURING PRE OP AND PROCEDURE.   **NO VISITORS ARE ALLOWED IN THE SHORT STAY AREA OR RECOVERY ROOM!!**   Your procedure is scheduled on: 10/02/20   Report to [facility] Main  Entrance    Report to admitting at 7:00 AM   Call this number if you have problems the morning of surgery [phone]   Do not eat food :After Midnight.   May have liquids until 6:00 AM day of surgery  CLEAR LIQUID DIET  Foods Allowed                                                                     Foods Excluded  Water, Black Coffee and tea, regular and decaf                liquids that you cannot  Plain Jell-O in any flavor  (No red)                                      see through such as: Fruit ices (not with fruit pulp)                                             milk, soups, orange juice              Iced Popsicles (No red)                                                All solid food                                   Apple juices Sports drinks like Gatorade (No red) Lightly seasoned clear broth or consume(fat free) Sugar, honey syrup     Oral Hygiene is also important to reduce your risk of infection.                                    Remember - BRUSH YOUR TEETH THE MORNING OF SURGERY WITH YOUR REGULAR TOOTHPASTE   Do NOT smoke after Midnight   Take these medicines the morning of surgery with A SIP OF WATER: Acetaminophen, Alprazolam, Omeprazole, Pravastatin, Propranolol, Risperidone.  How to Manage Your Diabetes Before and After Surgery  Why is it important to control my blood sugar before and after surgery? Improving blood sugar levels before and after surgery helps healing and can limit problems. A way of improving blood sugar control is eating a healthy diet by:  Eating less sugar and carbohydrates  Increasing activity/exercise  Talking with your doctor about reaching your blood sugar  goals High blood sugars (greater than 180 mg/dL) can raise your risk of  infections and slow your recovery, so you will need to focus on controlling your diabetes during the weeks before surgery. Make sure that the doctor who takes care of your diabetes knows about your planned surgery including the date and location.  How do I manage my blood sugar before surgery? Check your blood sugar at least 4 times a day, starting 2 days before surgery, to make sure that the level is not too high or low. Check your blood sugar the morning of your surgery when you wake up and every 2 hours until you get to the Short Stay unit. If your blood sugar is less than 70 mg/dL, you will need to treat for low blood sugar: Do not take insulin. Treat a low blood sugar (less than 70 mg/dL) with  cup of clear juice (cranberry or apple), 4 glucose tablets, OR glucose gel. Recheck blood sugar in 15 minutes after treatment (to make sure it is greater than 70 mg/dL). If your blood sugar is not greater than 70 mg/dL on recheck, call [phone] for further instructions. Report your blood sugar to the short stay nurse when you get to Short Stay.  If you are admitted to the hospital after surgery: Your blood sugar will be checked by the staff and you will probably be given insulin after surgery (instead of oral diabetes medicines) to make sure you have good blood sugar levels. The goal for blood sugar control after surgery is 80-180 mg/dL.  Reviewed and Endorsed by [facility] Patient Education Committee, August 2015                               You may not have any metal on your body including jewelry, and body piercing             Do not wear lotions, powders, cologne, or deodorant              Men may shave face and neck.   Do not bring valuables to the hospital. [place].   Contacts, dentures or bridgework may not be worn into surgery.    Patients discharged the  day of surgery will not be allowed to drive home.   Please read over the following fact sheets you were given: IF YOU HAVE QUESTIONS ABOUT YOUR PRE OP INSTRUCTIONS PLEASE CALL [phone]- [place] - Preparing for Surgery Before surgery, you can play an important role.  Because skin is not sterile, your skin needs to be as free of germs as possible.  You can reduce the number of germs on your skin by washing with CHG (chlorahexidine gluconate) soap before surgery.  CHG is an antiseptic cleaner which kills germs and bonds with the skin to continue killing germs even after washing. Please DO NOT use if you have an allergy to CHG or antibacterial soaps.  If your skin becomes reddened/irritated stop using the CHG and inform your nurse when you arrive at Short Stay. Do not shave (including legs and underarms) for at least 48 hours prior to the first CHG shower.  You may shave your face/neck.  Please follow these instructions carefully:  1.  Shower with CHG Soap the night before surgery and the  morning of surgery.  2.  If you choose to wash your hair, wash your hair first as  usual with your normal  shampoo.  3.  After you shampoo, rinse your hair and body thoroughly to remove the shampoo.                             4.  Use CHG as you would any other liquid soap.  You can apply chg directly to the skin and wash.  Gently with a scrungie or clean washcloth.  5.  Apply the CHG Soap to your body ONLY FROM THE NECK DOWN.   Do   not use on face/ open                           Wound or open sores. Avoid contact with eyes, ears mouth and   genitals (private parts).                       Wash face,  Genitals (private parts) with your normal soap.             6.  Wash thoroughly, paying special attention to the area where your    surgery  will be performed.  7.  Thoroughly rinse your body with warm water from the neck down.  8.  DO NOT shower/wash with your normal soap after using and rinsing off  the CHG Soap.                9.  Pat yourself dry with a clean towel.            10.  Wear clean pajamas.            11.  Place clean sheets on your bed the night of your first shower and do not  sleep with pets. Day of Surgery : Do not apply any lotions/deodorants the morning of surgery.  Please wear clean clothes to the hospital/surgery center.  FAILURE TO FOLLOW THESE INSTRUCTIONS MAY RESULT IN THE CANCELLATION OF YOUR SURGERY  PATIENT SIGNATURE_________________________________  NURSE SIGNATURE__________________________________  ________________________________________________________________________

## 2020-09-30 ENCOUNTER — Encounter (HOSPITAL_COMMUNITY): Payer: Self-pay

## 2020-09-30 ENCOUNTER — Other Ambulatory Visit: Payer: Self-pay | Admitting: *Deleted

## 2020-09-30 ENCOUNTER — Encounter (HOSPITAL_COMMUNITY)
Admission: RE | Admit: 2020-09-30 | Discharge: 2020-09-30 | Disposition: A | Discharge status: Home / Self Care | Payer: Medicare Other | Source: Ambulatory Visit | Attending: Surgery | Admitting: Surgery

## 2020-09-30 DIAGNOSIS — Z01818 Encounter for other preprocedural examination: Secondary | ICD-10-CM | POA: Insufficient documentation

## 2020-09-30 HISTORY — DX: Prediabetes: R73.03

## 2020-09-30 HISTORY — DX: Bipolar disorder, unspecified: F31.9

## 2020-09-30 LAB — CBC
HCT: 43.6 % (ref 39.0–52.0)
Hemoglobin: 13.9 g/dL (ref 13.0–17.0)
MCH: 28.7 pg (ref 26.0–34.0)
MCHC: 31.9 g/dL (ref 30.0–36.0)
MCV: 90.1 fL (ref 80.0–100.0)
Platelets: 242 10*3/uL (ref 150–400)
RBC: 4.84 MIL/uL (ref 4.22–5.81)
RDW: 12.6 % (ref 11.5–15.5)
WBC: 10.5 10*3/uL (ref 4.0–10.5)
nRBC: 0 % (ref 0.0–0.2)

## 2020-09-30 LAB — BASIC METABOLIC PANEL
Anion gap: 12 (ref 5–15)
BUN: 11 mg/dL (ref 8–23)
CO2: 27 mmol/L (ref 22–32)
Calcium: 9 mg/dL (ref 8.9–10.3)
Chloride: 99 mmol/L (ref 98–111)
Creatinine, Ser: 1.47 mg/dL — ABNORMAL HIGH (ref 0.61–1.24)
GFR, Estimated: 53 mL/min — ABNORMAL LOW (ref >60)
Glucose, Bld: 173 mg/dL — ABNORMAL HIGH (ref 70–99)
Potassium: 3.4 mmol/L — ABNORMAL LOW (ref 3.5–5.1)
Sodium: 138 mmol/L (ref 135–145)

## 2020-09-30 LAB — GLUCOSE, CAPILLARY: Glucose-Capillary: 194 mg/dL — ABNORMAL HIGH (ref 70–99)

## 2020-10-01 ENCOUNTER — Other Ambulatory Visit: Payer: Self-pay

## 2020-10-01 ENCOUNTER — Inpatient Hospital Stay: Payer: Medicare Other

## 2020-10-01 LAB — HEMOGLOBIN A1C
Hgb A1c MFr Bld: 7.2 % — ABNORMAL HIGH (ref 4.8–5.6)
Mean Plasma Glucose: 160 mg/dL

## 2020-10-01 MED ORDER — LIDOCAINE-PRILOCAINE 2.5-2.5 % EX CREA: 1.0000 "application " | TOPICAL_CREAM | CUTANEOUS | 0 refills | Status: DC

## 2020-10-01 MED ORDER — ONDANSETRON HCL 8 MG PO TABS: 8.0000 mg | ORAL_TABLET | Freq: Three times a day (TID) | ORAL | 1 refills | Status: DC | PRN

## 2020-10-01 MED ORDER — PROCHLORPERAZINE MALEATE 10 MG PO TABS: 10.0000 mg | ORAL_TABLET | Freq: Four times a day (QID) | ORAL | 1 refills | Status: DC | PRN

## 2020-10-01 NOTE — Anesthesia Preprocedure Evaluation (Addendum)
Anesthesia Evaluation  Patient identified by MRN, date of birth, ID band Patient awake    Reviewed: Allergy & Precautions, NPO status , Patient's Chart, lab work & pertinent test results  Airway Mallampati: II  TM Distance: >3 FB Neck ROM: Full    Dental no notable dental hx. (+) Edentulous Upper, Edentulous Lower   Pulmonary Current Smoker and Patient abstained from smoking.,    Pulmonary exam normal breath sounds clear to auscultation       Cardiovascular hypertension, Normal cardiovascular exam Rhythm:Regular Rate:Normal     Neuro/Psych  Headaches,    GI/Hepatic negative GI ROS, Rectal CA   Endo/Other    Renal/GU Renal InsufficiencyRenal diseaseLab Results      Component                Value               Date                      CREATININE               1.47 (H)            09/30/2020                BUN                      11                  09/30/2020                NA                       138                 09/30/2020                K                        3.4 (L)             09/30/2020                CL                       99                  09/30/2020                CO2                      27                  09/30/2020                Musculoskeletal negative musculoskeletal ROS (+)   Abdominal (+) + obese (BMI 33.55),   Peds  Hematology Lab Results      Component                Value               Date                      WBC                      10.5  09/30/2020                HGB                      13.9                09/30/2020                HCT                      43.6                09/30/2020                MCV                      90.1                09/30/2020                PLT                      242                 09/30/2020              Anesthesia Other Findings   Reproductive/Obstetrics                            Anesthesia  Physical Anesthesia Plan  ASA: 3  Anesthesia Plan: General   Post-op Pain Management:    Induction: Intravenous  PONV Risk Score and Plan: 3 and Treatment may vary due to age or medical condition, Midazolam and Ondansetron  Airway Management Planned: LMA  Additional Equipment: None  Intra-op Plan:   Post-operative Plan:   Informed Consent: I have reviewed the patients History and Physical, chart, labs and discussed the procedure including the risks, benefits and alternatives for the proposed anesthesia with the patient or authorized representative who has indicated his/her understanding and acceptance.     Dental advisory given  Plan Discussed with: CRNA and Anesthesiologist  Anesthesia Plan Comments: (LMA reg GA)       Anesthesia Quick Evaluation

## 2020-10-02 ENCOUNTER — Encounter (HOSPITAL_COMMUNITY)
Admission: RE | Disposition: A | Discharge status: Home / Self Care | Payer: Self-pay | Source: Home / Self Care | Attending: Surgery

## 2020-10-02 ENCOUNTER — Ambulatory Visit (HOSPITAL_COMMUNITY): Payer: Medicare Other

## 2020-10-02 ENCOUNTER — Ambulatory Visit (HOSPITAL_COMMUNITY): Payer: Medicare Other | Admitting: Anesthesiology

## 2020-10-02 ENCOUNTER — Ambulatory Visit (HOSPITAL_COMMUNITY): Payer: Medicare Other | Admitting: Physician Assistant

## 2020-10-02 ENCOUNTER — Ambulatory Visit (HOSPITAL_COMMUNITY)
Admission: RE | Admit: 2020-10-02 | Discharge: 2020-10-02 | Disposition: A | Discharge status: Home / Self Care | Payer: Medicare Other | Attending: Surgery | Admitting: Surgery

## 2020-10-02 ENCOUNTER — Encounter (HOSPITAL_COMMUNITY): Payer: Self-pay | Admitting: Surgery

## 2020-10-02 DIAGNOSIS — Z7982 Long term (current) use of aspirin: Secondary | ICD-10-CM | POA: Diagnosis not present

## 2020-10-02 DIAGNOSIS — R7303 Prediabetes: Secondary | ICD-10-CM | POA: Insufficient documentation

## 2020-10-02 DIAGNOSIS — G43909 Migraine, unspecified, not intractable, without status migrainosus: Secondary | ICD-10-CM | POA: Diagnosis not present

## 2020-10-02 DIAGNOSIS — I129 Hypertensive chronic kidney disease with stage 1 through stage 4 chronic kidney disease, or unspecified chronic kidney disease: Secondary | ICD-10-CM | POA: Diagnosis not present

## 2020-10-02 DIAGNOSIS — F17210 Nicotine dependence, cigarettes, uncomplicated: Secondary | ICD-10-CM | POA: Insufficient documentation

## 2020-10-02 DIAGNOSIS — Z79899 Other long term (current) drug therapy: Secondary | ICD-10-CM | POA: Diagnosis not present

## 2020-10-02 DIAGNOSIS — C20 Malignant neoplasm of rectum: Secondary | ICD-10-CM | POA: Insufficient documentation

## 2020-10-02 DIAGNOSIS — N182 Chronic kidney disease, stage 2 (mild): Secondary | ICD-10-CM | POA: Diagnosis not present

## 2020-10-02 DIAGNOSIS — Z452 Encounter for adjustment and management of vascular access device: Secondary | ICD-10-CM | POA: Diagnosis not present

## 2020-10-02 DIAGNOSIS — Z95828 Presence of other vascular implants and grafts: Secondary | ICD-10-CM

## 2020-10-02 HISTORY — PX: PORTACATH PLACEMENT: SHX2246

## 2020-10-02 LAB — GLUCOSE, CAPILLARY: Glucose-Capillary: 132 mg/dL — ABNORMAL HIGH (ref 70–99)

## 2020-10-02 SURGERY — INSERTION, TUNNELED CENTRAL VENOUS DEVICE, WITH PORT
Anesthesia: General

## 2020-10-02 MED ORDER — LIDOCAINE 2% (20 MG/ML) 5 ML SYRINGE
INTRAMUSCULAR | Status: DC | PRN
  Administered 2020-10-02: 100 mg via INTRAVENOUS

## 2020-10-02 MED ORDER — ONDANSETRON HCL 4 MG/2ML IJ SOLN
INTRAMUSCULAR | Status: AC
  Filled 2020-10-02: qty 2

## 2020-10-02 MED ORDER — AMISULPRIDE (ANTIEMETIC) 5 MG/2ML IV SOLN: 10.0000 mg | Freq: Once | INTRAVENOUS | Status: DC | PRN

## 2020-10-02 MED ORDER — ORAL CARE MOUTH RINSE: 15.0000 mL | Freq: Once | OROMUCOSAL | Status: AC

## 2020-10-02 MED ORDER — TRAMADOL HCL 50 MG PO TABS: 50.0000 mg | ORAL_TABLET | Freq: Four times a day (QID) | ORAL | 0 refills | Status: AC | PRN

## 2020-10-02 MED ORDER — SODIUM CHLORIDE 0.9 % IV SOLN
2.0000 g | INTRAVENOUS | Status: AC
  Administered 2020-10-02: 2 g via INTRAVENOUS
  Filled 2020-10-02: qty 2

## 2020-10-02 MED ORDER — MIDAZOLAM HCL 2 MG/2ML IJ SOLN
INTRAMUSCULAR | Status: AC
  Filled 2020-10-02: qty 2

## 2020-10-02 MED ORDER — ONDANSETRON HCL 4 MG/2ML IJ SOLN
INTRAMUSCULAR | Status: DC | PRN
  Administered 2020-10-02: 4 mg via INTRAVENOUS

## 2020-10-02 MED ORDER — LIDOCAINE 2% (20 MG/ML) 5 ML SYRINGE
INTRAMUSCULAR | Status: AC
  Filled 2020-10-02: qty 5

## 2020-10-02 MED ORDER — ONDANSETRON HCL 4 MG/2ML IJ SOLN: 4.0000 mg | Freq: Once | INTRAMUSCULAR | Status: DC | PRN

## 2020-10-02 MED ORDER — EPHEDRINE 5 MG/ML INJ
INTRAVENOUS | Status: AC
  Filled 2020-10-02: qty 5

## 2020-10-02 MED ORDER — LACTATED RINGERS IV SOLN: INTRAVENOUS | Status: DC

## 2020-10-02 MED ORDER — HYDROMORPHONE HCL 1 MG/ML IJ SOLN: 0.2500 mg | INTRAMUSCULAR | Status: DC | PRN

## 2020-10-02 MED ORDER — PHENYLEPHRINE HCL-NACL 10-0.9 MG/250ML-% IV SOLN
INTRAVENOUS | Status: DC | PRN
  Administered 2020-10-02: 50 ug/min via INTRAVENOUS

## 2020-10-02 MED ORDER — PROPOFOL 10 MG/ML IV BOLUS
INTRAVENOUS | Status: DC | PRN
  Administered 2020-10-02: 200 mg via INTRAVENOUS

## 2020-10-02 MED ORDER — FENTANYL CITRATE (PF) 100 MCG/2ML IJ SOLN
INTRAMUSCULAR | Status: AC
  Filled 2020-10-02: qty 2

## 2020-10-02 MED ORDER — BUPIVACAINE-EPINEPHRINE 0.25% -1:200000 IJ SOLN
INTRAMUSCULAR | Status: DC | PRN
  Administered 2020-10-02: 7 mL

## 2020-10-02 MED ORDER — PHENYLEPHRINE HCL (PRESSORS) 10 MG/ML IV SOLN
INTRAVENOUS | Status: AC
  Filled 2020-10-02: qty 1

## 2020-10-02 MED ORDER — HEPARIN 6000 UNIT IRRIGATION SOLUTION
Freq: Once | Status: AC
  Administered 2020-10-02: 1
  Filled 2020-10-02: qty 6000

## 2020-10-02 MED ORDER — MIDAZOLAM HCL 5 MG/5ML IJ SOLN
INTRAMUSCULAR | Status: DC | PRN
  Administered 2020-10-02: 2 mg via INTRAVENOUS

## 2020-10-02 MED ORDER — FENTANYL CITRATE (PF) 250 MCG/5ML IJ SOLN
INTRAMUSCULAR | Status: DC | PRN
  Administered 2020-10-02: 25 ug via INTRAVENOUS

## 2020-10-02 MED ORDER — BUPIVACAINE-EPINEPHRINE (PF) 0.25% -1:200000 IJ SOLN
INTRAMUSCULAR | Status: AC
  Filled 2020-10-02: qty 30

## 2020-10-02 MED ORDER — OXYCODONE HCL 5 MG/5ML PO SOLN: 5.0000 mg | Freq: Once | ORAL | Status: DC | PRN

## 2020-10-02 MED ORDER — HEPARIN SOD (PORK) LOCK FLUSH 100 UNIT/ML IV SOLN
INTRAVENOUS | Status: DC | PRN
  Administered 2020-10-02: 100 [IU] via INTRAVENOUS

## 2020-10-02 MED ORDER — DEXAMETHASONE SODIUM PHOSPHATE 10 MG/ML IJ SOLN
INTRAMUSCULAR | Status: AC
  Filled 2020-10-02: qty 1

## 2020-10-02 MED ORDER — CHLORHEXIDINE GLUCONATE 0.12 % MT SOLN
15.0000 mL | Freq: Once | OROMUCOSAL | Status: AC
  Administered 2020-10-02: 15 mL via OROMUCOSAL

## 2020-10-02 MED ORDER — PROPOFOL 10 MG/ML IV BOLUS
INTRAVENOUS | Status: AC
  Filled 2020-10-02: qty 20

## 2020-10-02 MED ORDER — HEPARIN SOD (PORK) LOCK FLUSH 100 UNIT/ML IV SOLN
INTRAVENOUS | Status: AC
  Filled 2020-10-02: qty 5

## 2020-10-02 MED ORDER — OXYCODONE HCL 5 MG PO TABS: 5.0000 mg | ORAL_TABLET | Freq: Once | ORAL | Status: DC | PRN

## 2020-10-02 MED ORDER — ACETAMINOPHEN 10 MG/ML IV SOLN: 1000.0000 mg | Freq: Once | INTRAVENOUS | Status: DC | PRN

## 2020-10-02 MED ORDER — ARTIFICIAL TEARS OPHTHALMIC OINT
TOPICAL_OINTMENT | OPHTHALMIC | Status: AC
  Filled 2020-10-02: qty 3.5

## 2020-10-02 SURGICAL SUPPLY — 41 items
ADH SKN CLS APL DERMABOND .7 (GAUZE/BANDAGES/DRESSINGS) ×1
APL PRP STRL LF DISP 70% ISPRP (MISCELLANEOUS) ×1
APL SKNCLS STERI-STRIP NONHPOA (GAUZE/BANDAGES/DRESSINGS)
BAG COUNTER SPONGE SURGICOUNT (BAG) IMPLANT
BAG DECANTER FOR FLEXI CONT (MISCELLANEOUS) ×2 IMPLANT
BAG SPNG CNTER NS LX DISP (BAG)
BENZOIN TINCTURE PRP APPL 2/3 (GAUZE/BANDAGES/DRESSINGS) IMPLANT
BLADE SURG 15 STRL LF DISP TIS (BLADE) ×1 IMPLANT
BLADE SURG 15 STRL SS (BLADE) ×2
BLADE SURG SZ11 CARB STEEL (BLADE) ×2 IMPLANT
CATH PORTA XCELA P 8FR SL FA (Port) ×1 IMPLANT
CHLORAPREP W/TINT 26 (MISCELLANEOUS) ×2 IMPLANT
COVER SURGICAL LIGHT HANDLE (MISCELLANEOUS) ×2 IMPLANT
DECANTER SPIKE VIAL GLASS SM (MISCELLANEOUS) ×2 IMPLANT
DERMABOND ADVANCED (GAUZE/BANDAGES/DRESSINGS) ×1
DERMABOND ADVANCED .7 DNX12 (GAUZE/BANDAGES/DRESSINGS) ×1 IMPLANT
DRAPE C-ARM 42X120 X-RAY (DRAPES) ×2 IMPLANT
DRAPE LAPAROSCOPIC ABDOMINAL (DRAPES) ×2 IMPLANT
ELECT REM PT RETURN 15FT ADLT (MISCELLANEOUS) ×2 IMPLANT
GAUZE 4X4 16PLY ~~LOC~~+RFID DBL (SPONGE) ×2 IMPLANT
GAUZE SPONGE 4X4 12PLY STRL (GAUZE/BANDAGES/DRESSINGS) ×2 IMPLANT
GLOVE SURG POLYISO LF SZ5.5 (GLOVE) ×2 IMPLANT
GLOVE SURG POLYISO LF SZ6 (GLOVE) ×2 IMPLANT
GOWN STRL REUS W/TWL LRG LVL3 (GOWN DISPOSABLE) ×2 IMPLANT
GOWN STRL REUS W/TWL XL LVL3 (GOWN DISPOSABLE) ×2 IMPLANT
KIT BASIN OR (CUSTOM PROCEDURE TRAY) ×2 IMPLANT
KIT TURNOVER KIT A (KITS) ×2 IMPLANT
NEEDLE HYPO 22GX1.5 SAFETY (NEEDLE) ×2 IMPLANT
PACK BASIC VI WITH GOWN DISP (CUSTOM PROCEDURE TRAY) ×2 IMPLANT
PENCIL SMOKE EVACUATOR (MISCELLANEOUS) IMPLANT
SUT MNCRL AB 4-0 PS2 18 (SUTURE) ×2 IMPLANT
SUT PROLENE 2 0 SH DA (SUTURE) ×4 IMPLANT
SUT VIC AB 2-0 SH 18 (SUTURE) IMPLANT
SUT VIC AB 2-0 SH 27 (SUTURE)
SUT VIC AB 2-0 SH 27X BRD (SUTURE) IMPLANT
SUT VIC AB 3-0 SH 27 (SUTURE) ×2
SUT VIC AB 3-0 SH 27X BRD (SUTURE) ×1 IMPLANT
SYR 10ML LL (SYRINGE) ×2 IMPLANT
SYR 20ML LL LF (SYRINGE) ×2 IMPLANT
TOWEL OR 17X26 10 PK STRL BLUE (TOWEL DISPOSABLE) ×2 IMPLANT
TOWEL OR NON WOVEN STRL DISP B (DISPOSABLE) ×2 IMPLANT

## 2020-10-02 NOTE — Op Note (Signed)
Date: 10/02/20  Patient: William Good MRN: [id number]  Preoperative Diagnosis: Rectal cancer Postoperative Diagnosis: Same  Procedure: Port-a-cath insertion  Surgeon: Michaelle Birks, MD  EBL: Minimal  Anesthesia: General LMA  Specimens: None  Indications: [name] is a 65 yo male who was recently diagnosed with stage III rectal cancer. He will be undergoing total neoadjuvant chemotherapy followed by radiation, and presents for port placement.  Findings: 8-Fr single-lumen Xcela port placed via the right internal jugular vein under ultrasound and fluoroscopic guidance. Total catheter length of 24 cm.  Procedure details: Informed consent was obtained in the preoperative area prior to the procedure. The patient was brought to the operating room and placed on the table in the supine position. General anesthesia was induced and appropriate lines and drains were placed for intraoperative monitoring. Perioperative antibiotics were administered per SCIP guidelines. The neck and chest were prepped and draped in the usual sterile fashion. A pre-procedure timeout was taken verifying patient identity, surgical site and procedure to be performed.  The patient was placed in Trendelenberg and access to the right subclavian vein was attempted but was not successful. The right internal jugular vein was accessed with a large-bore needle under ultrasound guidance. A guidewire was inserted and advanced, and position in the SVC was confirmed fluoroscopically. Next a place for the port was chosen on the upper lateral right chest and the skin was infiltrated with 0.25% bupivicaine. A small skin incision was made and a subcutaneous pocket was created with cautery. The port and catheter were then flushed and brought onto the field. Four 3-0 prolene sutures were used to secure the port in the subcutaneous pocket to the pectoralis fascia, but the sutures were not tied down. The port was placed in the pocket and  the attached cathether was tunneled beneath the skin to the wire exit site. The catheter was then measured using fluoroscopy - it was placed over the skin adjacent to the guidewire, and marked externally at the cavoatrial junction. The catheter was then cut at this location, which was at 24 cm. The dilator and sheath were then advanced over the guidewire under fluoroscopic guidance, and the wire and dilator were removed. The end of the catheter was inserted through the sheath and advanced, and the sheath was peeled away. The port was then accessed with a Huber needle, and blood was aspirated and the port was flushed with heparinized saline. A final fluoroscopic image confirmed appropriate position of the catheter tip within the SVC, without kinking of the catheter. The prolene sutures were tied down. A final flush of 500 units heparin (100 units/mL) was given via the port. The skin was closed with a deep dermal layer of interrupted 3-0 Vicryl suture, followed by a running subcuticular 4-0 monocryl suture. Dermabond was applied.  The patient tolerated the procedure with no apparent complications. All counts were correct x2 at the end of the procedure. The patient was extubated and taken to PACU in stable condition.  Michaelle Birks, MD 10/02/20 10:19 AM

## 2020-10-02 NOTE — Transfer of Care (Signed)
Immediate Anesthesia Transfer of Care Note  Patient: William Good  Procedure(s) Performed: PORT-A-CATH PLACEMENT  Patient Location: PACU  Anesthesia Type:General  Level of Consciousness: awake, alert , oriented and patient cooperative  Airway & Oxygen Therapy: Patient Spontanous Breathing and Patient connected to face mask oxygen  Post-op Assessment: Report given to RN and Post -op Vital signs reviewed and stable  Post vital signs: Reviewed and stable  Last Vitals:  Vitals Value Taken Time  BP 131/83 10/02/20 1030  Temp    Pulse 66 10/02/20 1034  Resp 17 10/02/20 1034  SpO2 100 % 10/02/20 1034  Vitals shown include unvalidated device data.  Last Pain:  Vitals:   10/02/20 0725  TempSrc:   PainSc: 0-No pain         Complications: No notable events documented.

## 2020-10-02 NOTE — Anesthesia Procedure Notes (Signed)
Procedure Name: LMA Insertion Date/Time: 10/02/2020 9:20 AM Performed by: Cleda Daub, CRNA Pre-anesthesia Checklist: Patient identified, Emergency Drugs available, Suction available and Patient being monitored Patient Re-evaluated:Patient Re-evaluated prior to induction Oxygen Delivery Method: Circle system utilized Preoxygenation: Pre-oxygenation with 100% oxygen Induction Type: IV induction LMA: LMA inserted LMA Size: 5.0 Number of attempts: 1 Placement Confirmation: positive ETCO2 and breath sounds checked- equal and bilateral Tube secured with: Tape Dental Injury: Teeth and Oropharynx as per pre-operative assessment

## 2020-10-02 NOTE — H&P (Signed)
William Good is an 65 y.o. male.   Chief Complaint: rectal cancer HPI: William Good is a 66 yo male with a recently-diagnosed rectal cancer. He saw Dr. Johney Maine on 6/22 and was found to have a rectal mass on exam. Colonoscopy with biopsy confirmed invasive adenocarcinoma. Staging workup showed T2N1 disease (stage III) and he is to undergo total neoadjuvant therapy with FOLFOX and radiation. He presents today for port placement. He has not had any recent fevers or illnesses and takes no blood thinners.  Past Medical History:  Diagnosis Date   Back pain    Bipolar disorder ([place])    bipolar 1   Hypertension    Knee pain    Migraine    Pre-diabetes    Rectal cancer (HCC)     Past Surgical History:  Procedure Laterality Date   BACK SURGERY     JOINT REPLACEMENT      Family History  Problem Relation Age of Onset   ALS Mother    Heart failure Father    Social History:  reports that he has been smoking cigarettes. He has been smoking an average of .5 packs per day. He has never used smokeless tobacco. He reports that he does not drink alcohol and does not use drugs.  Allergies: No Known Allergies  Medications Prior to Admission  Medication Sig Dispense Refill   acetaminophen (TYLENOL) 325 MG tablet Take 650 mg every 4 (four) hours as needed by mouth for mild pain.     ALPRAZolam (XANAX) 1 MG tablet Take 1 mg by mouth 2 (two) times daily.     aspirin EC 81 MG tablet Take 81 mg by mouth daily. Swallow whole.     benztropine (COGENTIN) 0.5 MG tablet Take 0.5 mg by mouth at bedtime.     Cholecalciferol (VITAMIN D3) 50 MCG (2000 UT) capsule Take 2,000 Units by mouth daily.     Docusate Sodium (COLACE PO) Take 1 capsule by mouth daily.     LOTENSIN HCT 20-12.5 MG per tablet TAKE (2) TABLETS DAILY (Patient taking differently: Take 1 tablet by mouth daily.) 60 each 0   mirtazapine (REMERON) 45 MG tablet Take 45 mg by mouth at bedtime.     omeprazole (PRILOSEC) 20 MG capsule Take 1  capsule (20 mg total) 2 (two) times daily by mouth. 60 capsule 0   Polyethylene Glycol 3350 (MIRALAX PO) Take 17 g by mouth daily.     pravastatin (PRAVACHOL) 40 MG tablet Take 40 mg by mouth daily.     propranolol ER (INDERAL LA) 160 MG SR capsule Take 160 mg daily by mouth.     risperiDONE (RISPERDAL) 0.5 MG tablet Take 0.5 mg by mouth daily.     SUMAtriptan (IMITREX) 100 MG tablet Take 100 mg by mouth 2 (two) times daily as needed for migraine.     lidocaine-prilocaine (EMLA) cream Apply 1 application topically as directed. Apply 1/2 tablespoon to port-a-cath site 2 hours prior to stick and cover with plastic wrap to numb site 30 g 0   ondansetron (ZOFRAN) 8 MG tablet Take 1 tablet (8 mg total) by mouth every 8 (eight) hours as needed for nausea or vomiting. 30 tablet 1   prochlorperazine (COMPAZINE) 10 MG tablet Take 1 tablet (10 mg total) by mouth every 6 (six) hours as needed for nausea. 60 tablet 1    Results for orders placed or performed during the hospital encounter of 10/02/20 (from the past 48 hour(s))  Glucose, capillary  Status: Abnormal   Collection Time: 10/02/20  7:07 AM  Result Value Ref Range   Glucose-Capillary 132 (H) 70 - 99 mg/dL    Comment: Glucose reference range applies only to samples taken after fasting for at least 8 hours.   No results found.  Review of Systems  Constitutional:  Negative for chills and fever.  Respiratory:  Negative for wheezing and stridor.   Gastrointestinal:  Positive for anal bleeding.  Neurological:  Negative for speech difficulty.  Psychiatric/Behavioral:  Negative for agitation and confusion.    Blood pressure (!) 143/93, pulse 72, temperature 97.6 F (36.4 C), temperature source Oral, resp. rate 15, weight 112.2 kg, SpO2 95 %. Physical Exam Constitutional:      General: He is not in acute distress.    Appearance: Normal appearance.  HENT:     Head: Normocephalic and atraumatic.  Eyes:     General: No scleral icterus.     Conjunctiva/sclera: Conjunctivae normal.  Pulmonary:     Effort: Pulmonary effort is normal. No respiratory distress.     Breath sounds: Normal breath sounds.     Comments: No chest wall deformities. Musculoskeletal:        General: Normal range of motion.     Cervical back: Normal range of motion.  Skin:    General: Skin is warm and dry.     Coloration: Skin is not jaundiced.  Neurological:     General: No focal deficit present.     Mental Status: He is alert and oriented to person, place, and time. Mental status is at baseline.  Psychiatric:        Mood and Affect: Mood normal.        Behavior: Behavior normal.        Thought Content: Thought content normal.     Assessment/Plan 65 yo male with stage III rectal cancer. To begin neoadjuvant chemotherapy next week. Proceed to OR for portacath placement. I discussed the procedure details with the patient and reviewed the risks, including bleeding, infection and pneumothorax. He agrees to proceed. Informed consent obtained. Plan for CXR in PACU and discharge home.  Dwan Bolt, MD 10/02/2020, 8:45 AM

## 2020-10-02 NOTE — Discharge Instructions (Signed)
SURGERY DISCHARGE INSTRUCTIONS: PORT-A-CATH PLACEMENT  Activity You may resume your usual activities as tolerated Ok to shower in 48 hours, but do not bathe or submerge incision underwater for 2 weeks. Do not drive while taking narcotic pain medication.  Wound Care Your incision is covered with skin glue called Dermabond. This will peel off on its own over time. You may shower and allow warm soapy water to run over your incisions. Gently pat dry. Do not submerge your incision underwater. Monitor your incision for any new redness, tenderness, or drainage. You may start using your port in 48 hours. Do not apply EMLA cream directly over the Dermabond (skin glue).  When to Call us: Fever greater than 100.5 New redness, drainage, or swelling at incision site Severe pain, nausea, or vomiting Shortness of breath, difficulty breathing  For questions or concerns, please call the [facility] Surgery office at [phone].

## 2020-10-02 NOTE — Anesthesia Postprocedure Evaluation (Signed)
Anesthesia Post Note  Patient: William Good  Procedure(s) Performed: [place]     Patient location during evaluation: PACU Anesthesia Type: General Level of consciousness: awake and alert Pain management: pain level controlled Vital Signs Assessment: post-procedure vital signs reviewed and stable Respiratory status: spontaneous breathing, nonlabored ventilation, respiratory function stable and patient connected to nasal cannula oxygen Cardiovascular status: blood pressure returned to baseline and stable Postop Assessment: no apparent nausea or vomiting Anesthetic complications: no   No notable events documented.  Last Vitals:  Vitals:   10/02/20 1100 10/02/20 1127  BP: 112/71 121/85  Pulse: 67 69  Resp: 19 16  Temp: 36.7 C   SpO2: 90% 93%    Last Pain:  Vitals:   10/02/20 1127  TempSrc:   PainSc: 3                  Barnet Glasgow

## 2020-10-03 ENCOUNTER — Other Ambulatory Visit: Payer: Self-pay | Admitting: Oncology

## 2020-10-03 ENCOUNTER — Encounter (HOSPITAL_COMMUNITY): Payer: Self-pay | Admitting: Surgery

## 2020-10-06 ENCOUNTER — Inpatient Hospital Stay: Payer: Medicare Other

## 2020-10-06 ENCOUNTER — Inpatient Hospital Stay: Payer: Medicare Other | Admitting: Nurse Practitioner

## 2020-10-06 ENCOUNTER — Encounter: Payer: Medicare Other | Admitting: Nutrition

## 2020-10-06 ENCOUNTER — Encounter (HOSPITAL_COMMUNITY): Payer: Self-pay | Admitting: Surgery

## 2020-10-07 DIAGNOSIS — B351 Tinea unguium: Secondary | ICD-10-CM | POA: Diagnosis not present

## 2020-10-07 DIAGNOSIS — M79676 Pain in unspecified toe(s): Secondary | ICD-10-CM | POA: Diagnosis not present

## 2020-10-07 NOTE — Progress Notes (Signed)
Pharmacist Chemotherapy Monitoring - Initial Assessment    Anticipated start date: 10/08/20   The following has been reviewed per standard work regarding the patient's treatment regimen: The patient's diagnosis, treatment plan and drug doses, and organ/hematologic function Lab orders and baseline tests specific to treatment regimen  The treatment plan start date, drug sequencing, and pre-medications Prior authorization status  Patient's documented medication list, including drug-drug interaction screen and prescriptions for anti-emetics and supportive care specific to the treatment regimen The drug concentrations, fluid compatibility, administration routes, and timing of the medications to be used The patient's access for treatment and lifetime cumulative dose history, if applicable  The patient's medication allergies and previous infusion related reactions, if applicable   Changes made to treatment plan:  N/A  Follow up needed:  N/A   Patrica Duel, [facility], 10/07/2020  2:35 PM

## 2020-10-08 ENCOUNTER — Telehealth: Payer: Self-pay

## 2020-10-08 ENCOUNTER — Inpatient Hospital Stay: Payer: Medicare Other | Attending: Oncology

## 2020-10-08 ENCOUNTER — Ambulatory Visit: Payer: Medicare Other | Admitting: Nurse Practitioner

## 2020-10-08 ENCOUNTER — Inpatient Hospital Stay: Payer: Medicare Other | Admitting: Nurse Practitioner

## 2020-10-08 ENCOUNTER — Other Ambulatory Visit: Payer: Self-pay

## 2020-10-08 ENCOUNTER — Other Ambulatory Visit: Payer: Medicare Other

## 2020-10-08 ENCOUNTER — Inpatient Hospital Stay: Payer: Medicare Other

## 2020-10-08 ENCOUNTER — Ambulatory Visit: Payer: Medicare Other

## 2020-10-08 ENCOUNTER — Other Ambulatory Visit: Payer: Self-pay | Admitting: Nurse Practitioner

## 2020-10-08 ENCOUNTER — Encounter: Payer: Self-pay | Admitting: Nurse Practitioner

## 2020-10-08 VITALS — BP 125/82 | HR 68 | Temp 97.8°F | Resp 20 | Ht 72.0 in | Wt 243.6 lb

## 2020-10-08 DIAGNOSIS — C20 Malignant neoplasm of rectum: Secondary | ICD-10-CM | POA: Insufficient documentation

## 2020-10-08 DIAGNOSIS — N189 Chronic kidney disease, unspecified: Secondary | ICD-10-CM | POA: Diagnosis not present

## 2020-10-08 DIAGNOSIS — Z79899 Other long term (current) drug therapy: Secondary | ICD-10-CM | POA: Insufficient documentation

## 2020-10-08 DIAGNOSIS — E1122 Type 2 diabetes mellitus with diabetic chronic kidney disease: Secondary | ICD-10-CM | POA: Insufficient documentation

## 2020-10-08 DIAGNOSIS — Z5111 Encounter for antineoplastic chemotherapy: Secondary | ICD-10-CM | POA: Insufficient documentation

## 2020-10-08 DIAGNOSIS — R197 Diarrhea, unspecified: Secondary | ICD-10-CM | POA: Diagnosis not present

## 2020-10-08 DIAGNOSIS — I129 Hypertensive chronic kidney disease with stage 1 through stage 4 chronic kidney disease, or unspecified chronic kidney disease: Secondary | ICD-10-CM | POA: Diagnosis not present

## 2020-10-08 DIAGNOSIS — R110 Nausea: Secondary | ICD-10-CM | POA: Diagnosis not present

## 2020-10-08 LAB — CEA (ACCESS): CEA (CHCC): 48.66 ng/mL — ABNORMAL HIGH (ref 0.00–5.00)

## 2020-10-08 LAB — CBC WITH DIFFERENTIAL (CANCER CENTER ONLY)
Abs Immature Granulocytes: 0.04 10*3/uL (ref 0.00–0.07)
Basophils Absolute: 0 10*3/uL (ref 0.0–0.1)
Basophils Relative: 0 %
Eosinophils Absolute: 0.3 10*3/uL (ref 0.0–0.5)
Eosinophils Relative: 3 %
HCT: 42.4 % (ref 39.0–52.0)
Hemoglobin: 13.4 g/dL (ref 13.0–17.0)
Immature Granulocytes: 0 %
Lymphocytes Relative: 31 %
Lymphs Abs: 3.1 10*3/uL (ref 0.7–4.0)
MCH: 28.2 pg (ref 26.0–34.0)
MCHC: 31.6 g/dL (ref 30.0–36.0)
MCV: 89.3 fL (ref 80.0–100.0)
Monocytes Absolute: 0.7 10*3/uL (ref 0.1–1.0)
Monocytes Relative: 7 %
Neutro Abs: 5.9 10*3/uL (ref 1.7–7.7)
Neutrophils Relative %: 59 %
Platelet Count: 229 10*3/uL (ref 150–400)
RBC: 4.75 MIL/uL (ref 4.22–5.81)
RDW: 12.5 % (ref 11.5–15.5)
WBC Count: 10 10*3/uL (ref 4.0–10.5)
nRBC: 0 % (ref 0.0–0.2)

## 2020-10-08 LAB — CMP (CANCER CENTER ONLY)
ALT: 20 U/L (ref 0–44)
AST: 29 U/L (ref 15–41)
Albumin: 3.9 g/dL (ref 3.5–5.0)
Alkaline Phosphatase: 84 U/L (ref 38–126)
Anion gap: 8 (ref 5–15)
BUN: 10 mg/dL (ref 8–23)
CO2: 33 mmol/L — ABNORMAL HIGH (ref 22–32)
Calcium: 8.9 mg/dL (ref 8.9–10.3)
Chloride: 100 mmol/L (ref 98–111)
Creatinine: 1.18 mg/dL (ref 0.61–1.24)
GFR, Estimated: >60 mL/min (ref >60)
Glucose, Bld: 137 mg/dL — ABNORMAL HIGH (ref 70–99)
Potassium: 3.2 mmol/L — ABNORMAL LOW (ref 3.5–5.1)
Sodium: 141 mmol/L (ref 135–145)
Total Bilirubin: 0.6 mg/dL (ref 0.3–1.2)
Total Protein: 6.7 g/dL (ref 6.5–8.1)

## 2020-10-08 MED ORDER — POTASSIUM CHLORIDE CRYS ER 20 MEQ PO TBCR: 20.0000 meq | EXTENDED_RELEASE_TABLET | Freq: Every day | ORAL | 1 refills | Status: DC

## 2020-10-08 MED ORDER — FLUOROURACIL CHEMO INJECTION 2.5 GM/50ML
400.0000 mg/m2 | Freq: Once | INTRAVENOUS | Status: AC
  Administered 2020-10-08: 950 mg via INTRAVENOUS
  Filled 2020-10-08: qty 19

## 2020-10-08 MED ORDER — FLUOROURACIL CHEMO INJECTION 5 GM/100ML
2400.0000 mg/m2 | INTRAVENOUS | Status: DC
  Administered 2020-10-08: 5700 mg via INTRAVENOUS
  Filled 2020-10-08: qty 114

## 2020-10-08 MED ORDER — DEXTROSE 5 % IV SOLN
Freq: Once | INTRAVENOUS | Status: AC
  Filled 2020-10-08: qty 250

## 2020-10-08 MED ORDER — SODIUM CHLORIDE 0.9 % IV SOLN
10.0000 mg | Freq: Once | INTRAVENOUS | Status: AC
  Administered 2020-10-08: 10 mg via INTRAVENOUS
  Filled 2020-10-08: qty 10

## 2020-10-08 MED ORDER — LEUCOVORIN CALCIUM INJECTION 350 MG
400.0000 mg/m2 | Freq: Once | INTRAVENOUS | Status: AC
  Administered 2020-10-08: 952 mg via INTRAVENOUS
  Filled 2020-10-08: qty 47.6

## 2020-10-08 MED ORDER — DEXTROSE 5 % IV SOLN
Freq: Once | INTRAVENOUS | Status: AC
Start: 2020-10-08 — End: 2020-10-08
  Filled 2020-10-08: qty 250

## 2020-10-08 MED ORDER — OXALIPLATIN CHEMO INJECTION 100 MG/20ML
85.0000 mg/m2 | Freq: Once | INTRAVENOUS | Status: AC
  Administered 2020-10-08: 200 mg via INTRAVENOUS
  Filled 2020-10-08: qty 40

## 2020-10-08 MED ORDER — PALONOSETRON HCL INJECTION 0.25 MG/5ML
0.2500 mg | Freq: Once | INTRAVENOUS | Status: AC
  Administered 2020-10-08: 0.25 mg via INTRAVENOUS
  Filled 2020-10-08: qty 5

## 2020-10-08 NOTE — Progress Notes (Signed)
  [facility] OFFICE PROGRESS NOTE   Diagnosis:  Rectal cancer  INTERVAL HISTORY:   Mr. Folts returns as scheduled.  He reports continued alternating constipation and diarrhea.  He has had diarrhea for the past 4 to 5 days.  He took Imodium last night.  No bleeding or pain with bowel movements.  No nausea or vomiting.  He describes his appetite as "okay".  Objective:  Vital signs in last 24 hours:  Blood pressure 125/82, pulse 68, temperature 97.8 F (36.6 C), temperature source Oral, resp. rate 20, height 6' (1.829 m), weight 243 lb 9.6 oz (110.5 kg), SpO2 97 %.    HEENT: Mild white coating over tongue.  No buccal thrush.  No ulcers. Resp: Lungs clear bilaterally. Cardio: Distant heart sounds.   GI: Abdomen soft and nontender.  No hepatomegaly. Vascular: No leg edema. Skin: No rash. Port-A-Cath without erythema.  Lab Results:  Lab Results  Component Value Date   WBC 10.0 10/08/2020   HGB 13.4 10/08/2020   HCT 42.4 10/08/2020   MCV 89.3 10/08/2020   PLT 229 10/08/2020   NEUTROABS 5.9 10/08/2020    Imaging:  No results found.  Medications: I have reviewed the patient's current medications.  Assessment/Plan: Rectal cancer Mass at 6 cm on digital exam/anoscopy 08/25/2020 Colonoscopy 09/05/2020-mid rectal mass-biopsy, adenocarcinoma, no loss of mismatch repair protein expression CT abdomen/pelvis 08/29/2020-small amount of free fluid in the pelvis, mildly enlarged left external iliac node, cystic changes of the left kidney with hydronephrosis and regions of cortical thinning with transition at the left UPJ suggesting chronic left UPJ obstruction MRI pelvis 09/24/2020-tumor 6 cm from the anal verge, 1.8 cm from the internal anal sphincter, T2, 7 mm lymph node in the left mesorectal sheath, N1 Cycle 1 FOLFOX 10/08/2020   Diabetes Chronic kidney disease Bipolar disorder Hypertension Hyperlipidemia Restless leg syndrome Multiple colon polyps removed on the  colonoscopy 09/05/2020-tubular adenomas including a rectal polyp with high-grade dysplasia-polypectomy margin negative for dysplasia Portacath placement, Dr Zenia Resides, 10/02/2020  Disposition: Mr. Ala appears stable.  He is scheduled to begin neoadjuvant chemotherapy with FOLFOX today.  We again reviewed potential toxicities.  Questions were answered.  He agrees to proceed.  We reviewed the CBC from today.  Counts adequate to proceed with treatment.  We discussed that he is receiving Decadron as a premedication.  He understands this can elevate his blood sugar.  He is unable to locate his blood glucose monitor.  We will contact his PCP regarding a new monitor.  He will return for lab, follow-up, cycle 2 FOLFOX in 2 weeks.  He will contact the office in the interim with any problems.    Ned Card ANP/GNP-BC   10/08/2020  8:35 AM

## 2020-10-08 NOTE — Telephone Encounter (Signed)
Called no answer message left explaining potassium order sent to pharmacy encouraged to call for any questions concerns or changes

## 2020-10-08 NOTE — Patient Instructions (Signed)
[facility]  Discharge Instructions: Thank you for choosing [facility] to provide your oncology and hematology care.   If you have a lab appointment with the [facility], please go directly to the [facility] and check in at the registration area.   Wear comfortable clothing and clothing appropriate for easy access to any Portacath or PICC line.   We strive to give you quality time with your provider. You may need to reschedule your appointment if you arrive late (15 or more minutes).  Arriving late affects you and other patients whose appointments are after yours.  Also, if you miss three or more appointments without notifying the office, you may be dismissed from the clinic at the provider's discretion.      For prescription refill requests, have your pharmacy contact our office and allow 72 hours for refills to be completed.    Today you received the following chemotherapy and/or immunotherapy agents oxaliplatin, leucovorin, fluorouracil   To help prevent nausea and vomiting after your treatment, we encourage you to take your nausea medication as directed.  BELOW ARE SYMPTOMS THAT SHOULD BE REPORTED IMMEDIATELY: *FEVER GREATER THAN 100.4 F (38 C) OR HIGHER *CHILLS OR SWEATING *NAUSEA AND VOMITING THAT IS NOT CONTROLLED WITH YOUR NAUSEA MEDICATION *UNUSUAL SHORTNESS OF BREATH *UNUSUAL BRUISING OR BLEEDING *URINARY PROBLEMS (pain or burning when urinating, or frequent urination) *BOWEL PROBLEMS (unusual diarrhea, constipation, pain near the anus) TENDERNESS IN MOUTH AND THROAT WITH OR WITHOUT PRESENCE OF ULCERS (sore throat, sores in mouth, or a toothache) UNUSUAL RASH, SWELLING OR PAIN  UNUSUAL VAGINAL DISCHARGE OR ITCHING   Items with * indicate a potential emergency and should be followed up as soon as possible or go to the Emergency Department if any problems should occur.  Please show the CHEMOTHERAPY ALERT CARD or IMMUNOTHERAPY ALERT  CARD at check-in to the Emergency Department and triage nurse.  Should you have questions after your visit or need to cancel or reschedule your appointment, please contact   Dept: [phone]  and follow the prompts.  Office hours are 8:00 a.m. to 4:30 p.m. Monday - Friday. Please note that voicemails left after 4:00 p.m. may not be returned until the following business day.  We are closed weekends and major holidays. You have access to a nurse at all times for urgent questions. Please call the main number to the clinic Dept: [phone] and follow the prompts.   For any non-urgent questions, you may also contact your provider using MyChart. We now offer e-Visits for anyone 96 and older to request care online for non-urgent symptoms. For details visit [email or web address].   Also download the MyChart app! Go to the app store, search "MyChart", open the app, select [facility], and log in with your MyChart username and password.  Due to Covid, a mask is required upon entering the hospital/clinic. If you do not have a mask, one will be given to you upon arrival. For doctor visits, patients may have 1 support person aged 26 or older with them. For treatment visits, patients cannot have anyone with them due to current Covid guidelines and our immunocompromised population.   Oxaliplatin Injection What is this medication? OXALIPLATIN (ox AL i PLA tin) is a chemotherapy drug. It targets fast dividing cells, like cancer cells, and causes these cells to die. This medicine is usedto treat cancers of the colon and rectum, and many other cancers. This medicine may be used  for other purposes; ask your health care provider orpharmacist if you have questions. COMMON BRAND NAME(S): Eloxatin What should I tell my care team before I take this medication? They need to know if you have any of these conditions: heart disease history of irregular heartbeat liver disease low  blood counts, like white cells, platelets, or red blood cells lung or breathing disease, like asthma take medicines that treat or prevent blood clots tingling of the fingers or toes, or other nerve disorder an unusual or allergic reaction to oxaliplatin, other chemotherapy, other medicines, foods, dyes, or preservatives pregnant or trying to get pregnant breast-feeding How should I use this medication? This drug is given as an infusion into a vein. It is administered in a hospitalor clinic by a specially trained health care professional. Talk to your pediatrician regarding the use of this medicine in children.Special care may be needed. Overdosage: If you think you have taken too much of this medicine contact apoison control center or emergency room at once. NOTE: This medicine is only for you. Do not share this medicine with others. What if I miss a dose? It is important not to miss a dose. Call your doctor or health careprofessional if you are unable to keep an appointment. What may interact with this medication? Do not take this medicine with any of the following medications: cisapride dronedarone pimozide thioridazine This medicine may also interact with the following medications: aspirin and aspirin-like medicines certain medicines that treat or prevent blood clots like warfarin, apixaban, dabigatran, and rivaroxaban cisplatin cyclosporine diuretics medicines for infection like acyclovir, adefovir, amphotericin B, bacitracin, cidofovir, foscarnet, ganciclovir, gentamicin, pentamidine, vancomycin NSAIDs, medicines for pain and inflammation, like ibuprofen or naproxen other medicines that prolong the QT interval (an abnormal heart rhythm) pamidronate zoledronic acid This list may not describe all possible interactions. Give your health care provider a list of all the medicines, herbs, non-prescription drugs, or dietary supplements you use. Also tell them if you smoke, drink alcohol,  or use illegaldrugs. Some items may interact with your medicine. What should I watch for while using this medication? Your condition will be monitored carefully while you are receiving thismedicine. You may need blood work done while you are taking this medicine. This medicine may make you feel generally unwell. This is not uncommon as chemotherapy can affect healthy cells as well as cancer cells. Report any side effects. Continue your course of treatment even though you feel ill unless yourhealthcare professional tells you to stop. This medicine can make you more sensitive to cold. Do not drink cold drinks or use ice. Cover exposed skin before coming in contact with cold temperatures or cold objects. When out in cold weather wear warm clothing and cover your mouth and nose to warm the air that goes into your lungs. Tell your doctor if you getsensitive to the cold. Do not become pregnant while taking this medicine or for 9 months after stopping it. Women should inform their health care professional if they wish to become pregnant or think they might be pregnant. Men should not father a child while taking this medicine and for 6 months after stopping it. There is potential for serious side effects to an unborn child. Talk to your health careprofessional for more information. Do not breast-feed a child while taking this medicine or for 3 months afterstopping it. This medicine has caused ovarian failure in some women. This medicine may make it more difficult to get pregnant. Talk to your health care professional if  youare concerned about your fertility. This medicine has caused decreased sperm counts in some men. This may make it more difficult to father a child. Talk to your health care professional if [place] concerned about your fertility. This medicine may increase your risk of getting an infection. Call your health care professional for advice if you get a fever, chills, or sore throat, or other symptoms  of a cold or flu. Do not treat yourself. Try to avoid beingaround people who are sick. Avoid taking medicines that contain aspirin, acetaminophen, ibuprofen, naproxen, or ketoprofen unless instructed by your health care professional.These medicines may hide a fever. Be careful brushing or flossing your teeth or using a toothpick because you may get an infection or bleed more easily. If you have any dental work done, Primary school teacher you are receiving this medicine. What side effects may I notice from receiving this medication? Side effects that you should report to your doctor or health care professionalas soon as possible: allergic reactions like skin rash, itching or hives, swelling of the face, lips, or tongue breathing problems cough low blood counts - this medicine may decrease the number of white blood cells, red blood cells, and platelets. You may be at increased risk for infections and bleeding nausea, vomiting pain, redness, or irritation at site where injected pain, tingling, numbness in the hands or feet signs and symptoms of bleeding such as bloody or black, tarry stools; red or dark brown urine; spitting up blood or brown material that looks like coffee grounds; red spots on the skin; unusual bruising or bleeding from the eyes, gums, or nose signs and symptoms of a dangerous change in heartbeat or heart rhythm like chest pain; dizziness; fast, irregular heartbeat; palpitations; feeling faint or lightheaded; falls signs and symptoms of infection like fever; chills; cough; sore throat; pain or trouble passing urine signs and symptoms of liver injury like dark yellow or brown urine; general ill feeling or flu-like symptoms; light-colored stools; loss of appetite; nausea; right upper belly pain; unusually weak or tired; yellowing of the eyes or skin signs and symptoms of low red blood cells or anemia such as unusually weak or tired; feeling faint or lightheaded; falls signs and symptoms of  muscle injury like dark urine; trouble passing urine or change in the amount of urine; unusually weak or tired; muscle pain; back pain Side effects that usually do not require medical attention (report to yourdoctor or health care professional if they continue or are bothersome): changes in taste diarrhea gas hair loss loss of appetite mouth sores This list may not describe all possible side effects. Call your doctor for medical advice about side effects. You may report side effects to FDA at1-800-FDA-1088. Where should I keep my medication? This drug is given in a hospital or clinic and will not be stored at home. NOTE: This sheet is a summary. It may not cover all possible information. If you have questions about this medicine, talk to your doctor, pharmacist, orhealth care provider.  2022 Elsevier/Gold Standard (2018-07-12 12:20:35)  Leucovorin injection What is this medication? LEUCOVORIN (loo koe VOR in) is used to prevent or treat the harmful effects of some medicines. This medicine is used to treat anemia caused by a low amount of folic acid in the body. It is also used with 5-fluorouracil (5-FU) to treatcolon cancer. This medicine may be used for other purposes; ask your health care provider orpharmacist if you have questions. What should I tell my care team before I take  this medication? They need to know if you have any of these conditions: anemia from low levels of vitamin B-12 in the blood an unusual or allergic reaction to leucovorin, folic acid, other medicines, foods, dyes, or preservatives pregnant or trying to get pregnant breast-feeding How should I use this medication? This medicine is for injection into a muscle or into a vein. It is given by ahealth care professional in a hospital or clinic setting. Talk to your pediatrician regarding the use of this medicine in children.Special care may be needed. Overdosage: If you think you have taken too much of this medicine  contact apoison control center or emergency room at once. NOTE: This medicine is only for you. Do not share this medicine with others. What if I miss a dose? This does not apply. What may interact with this medication? capecitabine fluorouracil phenobarbital phenytoin primidone trimethoprim-sulfamethoxazole This list may not describe all possible interactions. Give your health care provider a list of all the medicines, herbs, non-prescription drugs, or dietary supplements you use. Also tell them if you smoke, drink alcohol, or use illegaldrugs. Some items may interact with your medicine. What should I watch for while using this medication? Your condition will be monitored carefully while you are receiving thismedicine. This medicine may increase the side effects of 5-fluorouracil, 5-FU. Tell your doctor or health care professional if you have diarrhea or mouth sores that donot get better or that get worse. What side effects may I notice from receiving this medication? Side effects that you should report to your doctor or health care professionalas soon as possible: allergic reactions like skin rash, itching or hives, swelling of the face, lips, or tongue breathing problems fever, infection mouth sores unusual bleeding or bruising unusually weak or tired Side effects that usually do not require medical attention (report to yourdoctor or health care professional if they continue or are bothersome): constipation or diarrhea loss of appetite nausea, vomiting This list may not describe all possible side effects. Call your doctor for medical advice about side effects. You may report side effects to FDA at1-800-FDA-1088. Where should I keep my medication? This drug is given in a hospital or clinic and will not be stored at home. NOTE: This sheet is a summary. It may not cover all possible information. If you have questions about this medicine, talk to your doctor, pharmacist, orhealth care  provider.  2022 Elsevier/Gold Standard (2007-08-29 16:50:29)  Fluorouracil, 5-FU injection What is this medication? FLUOROURACIL, 5-FU (flure oh YOOR a sil) is a chemotherapy drug. It slows the growth of cancer cells. This medicine is used to treat many types of cancer like breast cancer, colon or rectal cancer, pancreatic cancer, and stomachcancer. This medicine may be used for other purposes; ask your health care provider orpharmacist if you have questions. COMMON BRAND NAME(S): Adrucil What should I tell my care team before I take this medication? They need to know if you have any of these conditions: blood disorders dihydropyrimidine dehydrogenase (DPD) deficiency infection (especially a virus infection such as chickenpox, cold sores, or herpes) kidney disease liver disease malnourished, poor nutrition recent or ongoing radiation therapy an unusual or allergic reaction to fluorouracil, other chemotherapy, other medicines, foods, dyes, or preservatives pregnant or trying to get pregnant breast-feeding How should I use this medication? This drug is given as an infusion or injection into a vein. It is administeredin a hospital or clinic by a specially trained health care professional. Talk to your pediatrician regarding the use of this  medicine in children.Special care may be needed. Overdosage: If you think you have taken too much of this medicine contact apoison control center or emergency room at once. NOTE: This medicine is only for you. Do not share this medicine with others. What if I miss a dose? It is important not to miss your dose. Call your doctor or health careprofessional if you are unable to keep an appointment. What may interact with this medication? Do not take this medicine with any of the following medications: live virus vaccines This medicine may also interact with the following medications: medicines that treat or prevent blood clots like warfarin, enoxaparin,  and dalteparin This list may not describe all possible interactions. Give your health care provider a list of all the medicines, herbs, non-prescription drugs, or dietary supplements you use. Also tell them if you smoke, drink alcohol, or use illegaldrugs. Some items may interact with your medicine. What should I watch for while using this medication? Visit your doctor for checks on your progress. This drug may make you feel generally unwell. This is not uncommon, as chemotherapy can affect healthy cells as well as cancer cells. Report any side effects. Continue your course oftreatment even though you feel ill unless your doctor tells you to stop. In some cases, you may be given additional medicines to help with side effects.Follow all directions for their use. Call your doctor or health care professional for advice if you get a fever, chills or sore throat, or other symptoms of a cold or flu. Do not treat yourself. This drug decreases your body's ability to fight infections. Try toavoid being around people who are sick. This medicine may increase your risk to bruise or bleed. Call your doctor orhealth care professional if you notice any unusual bleeding. Be careful brushing and flossing your teeth or using a toothpick because you may get an infection or bleed more easily. If you have any dental work done,tell your dentist you are receiving this medicine. Avoid taking products that contain aspirin, acetaminophen, ibuprofen, naproxen, or ketoprofen unless instructed by your doctor. These medicines may hide afever. Do not become pregnant while taking this medicine. Women should inform their doctor if they wish to become pregnant or think they might be pregnant. There is a potential for serious side effects to an unborn child. Talk to your health care professional or pharmacist for more information. Do not breast-feed aninfant while taking this medicine. Men should inform their doctor if they wish to father a  child. This medicinemay lower sperm counts. Do not treat diarrhea with over the counter products. Contact your doctor ifyou have diarrhea that lasts more than 2 days or if it is severe and watery. This medicine can make you more sensitive to the sun. Keep out of the sun. If you cannot avoid being in the sun, wear protective clothing and use sunscreen.Do not use sun lamps or tanning beds/booths. What side effects may I notice from receiving this medication? Side effects that you should report to your doctor or health care professionalas soon as possible: allergic reactions like skin rash, itching or hives, swelling of the face, lips, or tongue low blood counts - this medicine may decrease the number of white blood cells, red blood cells and platelets. You may be at increased risk for infections and bleeding. signs of infection - fever or chills, cough, sore throat, pain or difficulty passing urine signs of decreased platelets or bleeding - bruising, pinpoint red spots on the skin, black, tarry  stools, blood in the urine signs of decreased red blood cells - unusually weak or tired, fainting spells, lightheadedness breathing problems changes in vision chest pain mouth sores nausea and vomiting pain, swelling, redness at site where injected pain, tingling, numbness in the hands or feet redness, swelling, or sores on hands or feet stomach pain unusual bleeding Side effects that usually do not require medical attention (report to yourdoctor or health care professional if they continue or are bothersome): changes in finger or toe nails diarrhea dry or itchy skin hair loss headache loss of appetite sensitivity of eyes to the light stomach upset unusually teary eyes This list may not describe all possible side effects. Call your doctor for medical advice about side effects. You may report side effects to FDA at1-800-FDA-1088. Where should I keep my medication? This drug is given in a hospital  or clinic and will not be stored at home. NOTE: This sheet is a summary. It may not cover all possible information. If you have questions about this medicine, talk to your doctor, pharmacist, orhealth care provider.  2022 Elsevier/Gold Standard (2019-01-23 15:00:03)

## 2020-10-08 NOTE — Telephone Encounter (Signed)
-----   Message from Owens Shark, NP sent at 10/08/2020 11:13 AM EDT ----- Please let him know potassium level is mildly low, likely due to diarrhea.  I sent a prescription to his pharmacy for K-Dur 20 mEq daily.

## 2020-10-09 ENCOUNTER — Encounter: Payer: Self-pay | Admitting: Oncology

## 2020-10-09 NOTE — Telephone Encounter (Signed)
24 Hour Callback Call back for 1st time Folfox. Patient was not at home but spoke with family who reported that he was doing fine. No side effects noted.

## 2020-10-10 ENCOUNTER — Other Ambulatory Visit: Payer: Self-pay

## 2020-10-10 ENCOUNTER — Inpatient Hospital Stay: Payer: Medicare Other

## 2020-10-10 VITALS — BP 100/67 | HR 70 | Temp 98.2°F | Resp 18

## 2020-10-10 DIAGNOSIS — I129 Hypertensive chronic kidney disease with stage 1 through stage 4 chronic kidney disease, or unspecified chronic kidney disease: Secondary | ICD-10-CM | POA: Diagnosis not present

## 2020-10-10 DIAGNOSIS — C20 Malignant neoplasm of rectum: Secondary | ICD-10-CM

## 2020-10-10 DIAGNOSIS — Z79899 Other long term (current) drug therapy: Secondary | ICD-10-CM | POA: Diagnosis not present

## 2020-10-10 DIAGNOSIS — R197 Diarrhea, unspecified: Secondary | ICD-10-CM | POA: Diagnosis not present

## 2020-10-10 DIAGNOSIS — N189 Chronic kidney disease, unspecified: Secondary | ICD-10-CM | POA: Diagnosis not present

## 2020-10-10 DIAGNOSIS — Z5111 Encounter for antineoplastic chemotherapy: Secondary | ICD-10-CM | POA: Diagnosis not present

## 2020-10-10 DIAGNOSIS — E1122 Type 2 diabetes mellitus with diabetic chronic kidney disease: Secondary | ICD-10-CM | POA: Diagnosis not present

## 2020-10-10 DIAGNOSIS — R110 Nausea: Secondary | ICD-10-CM | POA: Diagnosis not present

## 2020-10-10 MED ORDER — SODIUM CHLORIDE 0.9% FLUSH
10.0000 mL | INTRAVENOUS | Status: DC | PRN
  Administered 2020-10-10: 10 mL
  Filled 2020-10-10: qty 10

## 2020-10-10 MED ORDER — HEPARIN SOD (PORK) LOCK FLUSH 100 UNIT/ML IV SOLN
500.0000 [IU] | Freq: Once | INTRAVENOUS | Status: AC | PRN
  Administered 2020-10-10: 500 [IU]
  Filled 2020-10-10: qty 5

## 2020-10-10 NOTE — Patient Instructions (Signed)
Implanted Port Home Guide An implanted port is a device that is placed under the skin. It is usually placed in the chest. The device can be used to give IV medicine, to take blood, or for dialysis. You may have an implanted port if: You need IV medicine that would be irritating to the small veins in your hands or arms. You need IV medicines, such as antibiotics, for a long period of time. You need IV nutrition for a long period of time. You need dialysis. When you have a port, your health care provider can choose to use the port instead of veins in your arms for these procedures. You may have fewer limitations when using a port than you would if you used other types of long-term IVs, and you will likely be able to return to normal activities afteryour incision heals. An implanted port has two main parts: Reservoir. The reservoir is the part where a needle is inserted to give medicines or draw blood. The reservoir is round. After it is placed, it appears as a small, raised area under your skin. Catheter. The catheter is a thin, flexible tube that connects the reservoir to a vein. Medicine that is inserted into the reservoir goes into the catheter and then into the vein. How is my port accessed? To access your port: A numbing cream may be placed on the skin over the port site. Your health care provider will put on a mask and sterile gloves. The skin over your port will be cleaned carefully with a germ-killing soap and allowed to dry. Your health care provider will gently pinch the port and insert a needle into it. Your health care provider will check for a blood return to make sure the port is in the vein and is not clogged. If your port needs to remain accessed to get medicine continuously (constant infusion), your health care provider will place a clear bandage (dressing) over the needle site. The dressing and needle will need to be changed every week, or as told by your health care provider. What  is flushing? Flushing helps keep the port from getting clogged. Follow instructions from your health care provider about how and when to flush the port. Ports are usually flushed with saline solution or a medicine called heparin. The need for flushing will depend on how the port is used: If the port is only used from time to time to give medicines or draw blood, the port may need to be flushed: Before and after medicines have been given. Before and after blood has been drawn. As part of routine maintenance. Flushing may be recommended every 4-6 weeks. If a constant infusion is running, the port may not need to be flushed. Throw away any syringes in a disposal container that is meant for sharp items (sharps container). You can buy a sharps container from a pharmacy, or you can make one by using an empty hard plastic bottle with a cover. How long will my port stay implanted? The port can stay in for as long as your health care provider thinks it is needed. When it is time for the port to come out, a surgery will be done to remove it. The surgery will be similar to the procedure that was done to putthe port in. Follow these instructions at home:  Flush your port as told by your health care provider. If you need an infusion over several days, follow instructions from your health care provider about how to take   care of your port site. Make sure you: Wash your hands with soap and water before you change your dressing. If soap and water are not available, use alcohol-based hand sanitizer. Change your dressing as told by your health care provider. Place any used dressings or infusion bags into a plastic bag. Throw that bag in the trash. Keep the dressing that covers the needle clean and dry. Do not get it wet. Do not use scissors or sharp objects near the tube. Keep the tube clamped, unless it is being used. Check your port site every day for signs of infection. Check for: Redness, swelling, or  pain. Fluid or blood. Pus or a bad smell. Protect the skin around the port site. Avoid wearing bra straps that rub or irritate the site. Protect the skin around your port from seat belts. Place a soft pad over your chest if needed. Bathe or shower as told by your health care provider. The site may get wet as long as you are not actively receiving an infusion. Return to your normal activities as told by your health care provider. Ask your health care provider what activities are safe for you. Carry a medical alert card or wear a medical alert bracelet at all times. This will let health care providers know that you have an implanted port in case of an emergency. Get help right away if: You have redness, swelling, or pain at the port site. You have fluid or blood coming from your port site. You have pus or a bad smell coming from the port site. You have a fever. Summary Implanted ports are usually placed in the chest for long-term IV access. Follow instructions from your health care provider about flushing the port and changing bandages (dressings). Take care of the area around your port by avoiding clothing that puts pressure on the area, and by watching for signs of infection. Protect the skin around your port from seat belts. Place a soft pad over your chest if needed. Get help right away if you have a fever or you have redness, swelling, pain, drainage, or a bad smell at the port site. This information is not intended to replace advice given to you by your health care provider. Make sure you discuss any questions you have with your healthcare provider. Document Revised: 07/09/2019 Document Reviewed: 07/09/2019 Elsevier Patient Education  2022 [employer]  

## 2020-10-17 ENCOUNTER — Other Ambulatory Visit: Payer: Self-pay | Admitting: Oncology

## 2020-10-17 ENCOUNTER — Telehealth: Payer: Self-pay | Admitting: General Practice

## 2020-10-17 ENCOUNTER — Telehealth: Payer: Self-pay

## 2020-10-17 MED ORDER — HYDROCODONE-ACETAMINOPHEN 5-325 MG PO TABS: 0.5000 | ORAL_TABLET | Freq: Two times a day (BID) | ORAL | 0 refills | Status: DC | PRN

## 2020-10-17 NOTE — Telephone Encounter (Signed)
[facility] CSW Progress Notes  Call from brother, Lendon Ka, stating patient is having stomach pains, worse since start of chemotherapy.  Called family back, advised they need to call medical oncologist w these concerns.  Secure chat sent to Dr Benay Spice and team alerting them to this call.  Edwyna Shell, LCSW Clinical Social Worker Phone:  [phone]

## 2020-10-17 NOTE — Telephone Encounter (Signed)
TC from Pt's Sister stating Pt is having stomach pain. Informed Pt's sister I would like to talk to Pt which she gave me his number to talk to the Pt. Spoke with Pt who stated he has been having stomach pain for 8 months and takes tylenol 500 mg twice a day Pt stated since he has had treatment his stomach pain has been worse.Asked Pt if he moves his bowels regularly and he stated 3-4 times a day inquired if color was normal and not black and tarry Pt denies stool being black or tarry. Discussed with Dr. Benay Spice who stated prescription for tramadol will be called in to [facility] as requested.Pt informed as well as his sister.

## 2020-10-17 NOTE — Telephone Encounter (Signed)
[facility] CSW Progress Notes  Second call from brother in law, Lendon Ka, describing patient's stomach pain and requesting medications be prescribed for this at Wednesday's visit.  Returned call and provided [facility] contact information.  Edwyna Shell, LCSW Clinical Social Worker Phone:  [phone]

## 2020-10-19 ENCOUNTER — Other Ambulatory Visit: Payer: Self-pay | Admitting: Oncology

## 2020-10-22 ENCOUNTER — Inpatient Hospital Stay: Payer: Medicare Other | Admitting: Oncology

## 2020-10-22 ENCOUNTER — Inpatient Hospital Stay: Payer: Medicare Other

## 2020-10-22 ENCOUNTER — Encounter: Payer: Self-pay | Admitting: *Deleted

## 2020-10-22 ENCOUNTER — Other Ambulatory Visit: Payer: Self-pay

## 2020-10-22 VITALS — BP 102/78 | HR 75 | Temp 97.8°F | Resp 18 | Ht 72.0 in | Wt 239.6 lb

## 2020-10-22 DIAGNOSIS — N189 Chronic kidney disease, unspecified: Secondary | ICD-10-CM | POA: Diagnosis not present

## 2020-10-22 DIAGNOSIS — C20 Malignant neoplasm of rectum: Secondary | ICD-10-CM

## 2020-10-22 DIAGNOSIS — E1122 Type 2 diabetes mellitus with diabetic chronic kidney disease: Secondary | ICD-10-CM | POA: Diagnosis not present

## 2020-10-22 DIAGNOSIS — Z5111 Encounter for antineoplastic chemotherapy: Secondary | ICD-10-CM | POA: Diagnosis not present

## 2020-10-22 DIAGNOSIS — Z79899 Other long term (current) drug therapy: Secondary | ICD-10-CM | POA: Diagnosis not present

## 2020-10-22 DIAGNOSIS — R197 Diarrhea, unspecified: Secondary | ICD-10-CM | POA: Diagnosis not present

## 2020-10-22 DIAGNOSIS — R110 Nausea: Secondary | ICD-10-CM | POA: Diagnosis not present

## 2020-10-22 DIAGNOSIS — I129 Hypertensive chronic kidney disease with stage 1 through stage 4 chronic kidney disease, or unspecified chronic kidney disease: Secondary | ICD-10-CM | POA: Diagnosis not present

## 2020-10-22 LAB — CBC WITH DIFFERENTIAL (CANCER CENTER ONLY)
Abs Immature Granulocytes: 0.02 10*3/uL (ref 0.00–0.07)
Basophils Absolute: 0 10*3/uL (ref 0.0–0.1)
Basophils Relative: 0 %
Eosinophils Absolute: 0.1 10*3/uL (ref 0.0–0.5)
Eosinophils Relative: 2 %
HCT: 40 % (ref 39.0–52.0)
Hemoglobin: 12.7 g/dL — ABNORMAL LOW (ref 13.0–17.0)
Immature Granulocytes: 0 %
Lymphocytes Relative: 46 %
Lymphs Abs: 2.3 10*3/uL (ref 0.7–4.0)
MCH: 27.8 pg (ref 26.0–34.0)
MCHC: 31.8 g/dL (ref 30.0–36.0)
MCV: 87.5 fL (ref 80.0–100.0)
Monocytes Absolute: 0.5 10*3/uL (ref 0.1–1.0)
Monocytes Relative: 9 %
Neutro Abs: 2.2 10*3/uL (ref 1.7–7.7)
Neutrophils Relative %: 43 %
Platelet Count: 167 10*3/uL (ref 150–400)
RBC: 4.57 MIL/uL (ref 4.22–5.81)
RDW: 13 % (ref 11.5–15.5)
WBC Count: 5 10*3/uL (ref 4.0–10.5)
nRBC: 0 % (ref 0.0–0.2)

## 2020-10-22 LAB — CMP (CANCER CENTER ONLY)
ALT: 27 U/L (ref 0–44)
AST: 27 U/L (ref 15–41)
Albumin: 3.7 g/dL (ref 3.5–5.0)
Alkaline Phosphatase: 93 U/L (ref 38–126)
Anion gap: 9 (ref 5–15)
BUN: 10 mg/dL (ref 8–23)
CO2: 30 mmol/L (ref 22–32)
Calcium: 8.9 mg/dL (ref 8.9–10.3)
Chloride: 100 mmol/L (ref 98–111)
Creatinine: 1.22 mg/dL (ref 0.61–1.24)
GFR, Estimated: >60 mL/min (ref >60)
Glucose, Bld: 175 mg/dL — ABNORMAL HIGH (ref 70–99)
Potassium: 3.6 mmol/L (ref 3.5–5.1)
Sodium: 139 mmol/L (ref 135–145)
Total Bilirubin: 0.4 mg/dL (ref 0.3–1.2)
Total Protein: 6.2 g/dL — ABNORMAL LOW (ref 6.5–8.1)

## 2020-10-22 MED ORDER — LEUCOVORIN CALCIUM INJECTION 350 MG
400.0000 mg/m2 | Freq: Once | INTRAVENOUS | Status: AC
  Administered 2020-10-22: 952 mg via INTRAVENOUS
  Filled 2020-10-22: qty 47.6

## 2020-10-22 MED ORDER — PALONOSETRON HCL INJECTION 0.25 MG/5ML
0.2500 mg | Freq: Once | INTRAVENOUS | Status: AC
  Administered 2020-10-22: 0.25 mg via INTRAVENOUS
  Filled 2020-10-22: qty 5

## 2020-10-22 MED ORDER — OXALIPLATIN CHEMO INJECTION 100 MG/20ML
85.0000 mg/m2 | Freq: Once | INTRAVENOUS | Status: AC
  Administered 2020-10-22: 200 mg via INTRAVENOUS
  Filled 2020-10-22: qty 40

## 2020-10-22 MED ORDER — DEXTROSE 5 % IV SOLN: Freq: Once | INTRAVENOUS | Status: AC

## 2020-10-22 MED ORDER — FLUOROURACIL CHEMO INJECTION 2.5 GM/50ML
400.0000 mg/m2 | Freq: Once | INTRAVENOUS | Status: AC
  Administered 2020-10-22: 950 mg via INTRAVENOUS
  Filled 2020-10-22: qty 19

## 2020-10-22 MED ORDER — SODIUM CHLORIDE 0.9 % IV SOLN
10.0000 mg | Freq: Once | INTRAVENOUS | Status: AC
  Administered 2020-10-22: 10 mg via INTRAVENOUS
  Filled 2020-10-22: qty 1

## 2020-10-22 MED ORDER — SODIUM CHLORIDE 0.9 % IV SOLN
2400.0000 mg/m2 | INTRAVENOUS | Status: DC
  Administered 2020-10-22: 5700 mg via INTRAVENOUS
  Filled 2020-10-22: qty 114

## 2020-10-22 NOTE — Progress Notes (Signed)
7 lb weight loss since chemo started. Provided samples of Ensure and coupons and ordered dietician referral to see him at next visit.

## 2020-10-22 NOTE — Progress Notes (Signed)
  [facility] OFFICE PROGRESS NOTE   Diagnosis: Rectal cancer  INTERVAL HISTORY:   William. Bordonaro completed cycle 1 FOLFOX on 10/08/2020.  No nausea or vomiting.  He reports diarrhea every other day.  Cold sensitivity lasted 3 to 4 days.  No other neuropathy symptoms.  He has mid abdominal pain intermittently.  The pain is relieved with one half of a hydrocodone tablet.  He is having bowel movements.  Objective:  Vital signs in last 24 hours:  Blood pressure 102/78, pulse 75, temperature 97.8 F (36.6 C), temperature source Oral, resp. rate 18, height 6' (1.829 m), weight 239 lb 9.6 oz (108.7 kg), SpO2 96 %.    HEENT: No buccal thrush or ulcers, mild white coat over the tongue Resp: Distant breath sounds, no respiratory distress Cardio: Distant heart sounds, regular rhythm GI: Nontender, no mass, no hepatosplenomegaly Vascular: No leg edema   Portacath/PICC-without erythema  Lab Results:  Lab Results  Component Value Date   WBC 5.0 10/22/2020   HGB 12.7 (L) 10/22/2020   HCT 40.0 10/22/2020   MCV 87.5 10/22/2020   PLT 167 10/22/2020   NEUTROABS 2.2 10/22/2020    CMP  Lab Results  Component Value Date   NA 139 10/22/2020   K 3.6 10/22/2020   CL 100 10/22/2020   CO2 30 10/22/2020   GLUCOSE 175 (H) 10/22/2020   BUN 10 10/22/2020   CREATININE 1.22 10/22/2020   CALCIUM 8.9 10/22/2020   PROT 6.2 (L) 10/22/2020   ALBUMIN 3.7 10/22/2020   AST 27 10/22/2020   ALT 27 10/22/2020   ALKPHOS 93 10/22/2020   BILITOT 0.4 10/22/2020   GFRNONAA >60 10/22/2020   GFRAA >60 01/17/2017    Lab Results  Component Value Date   CEA 48.66 (H) 10/08/2020    No results found for: INR, LABPROT  Imaging:  No results found.  Medications: I have reviewed the patient's current medications.   Assessment/Plan: Rectal cancer Mass at 6 cm on digital exam/anoscopy 08/25/2020 Colonoscopy 09/05/2020-mid rectal mass-biopsy, adenocarcinoma, no loss of mismatch repair protein  expression CT abdomen/pelvis 08/29/2020-small amount of free fluid in the pelvis, mildly enlarged left external iliac node, cystic changes of the left kidney with hydronephrosis and regions of cortical thinning with transition at the left UPJ suggesting chronic left UPJ obstruction MRI pelvis 09/24/2020-tumor 6 cm from the anal verge, 1.8 cm from the internal anal sphincter, T2, 7 mm lymph node in the left mesorectal sheath, N1 Cycle 1 FOLFOX 10/08/2020 Cycle 2 FOLFOX 10/22/2020   Diabetes Chronic kidney disease Bipolar disorder Hypertension Hyperlipidemia Restless leg syndrome Multiple colon polyps removed on the colonoscopy 09/05/2020-tubular adenomas including a rectal polyp with high-grade dysplasia-polypectomy margin negative for dysplasia Portacath placement, Dr Zenia Resides, 10/02/2020    Disposition: William Good tolerated the first cycle of FOLFOX well.  He will complete cycle 2 today.  He will return for an office visit and cycle 3 chemotherapy in 2 weeks.  The etiology of the mid abdominal pain is unclear.  I recommended he try Maalox.  Betsy Coder, MD  10/22/2020  9:13 AM

## 2020-10-22 NOTE — Patient Instructions (Signed)
[employer]   Discharge Instructions: Thank you for choosing [facility] to provide your oncology and hematology care.   If you have a lab appointment with the [facility], please go directly to the [facility] and check in at the registration area.   Wear comfortable clothing and clothing appropriate for easy access to any Portacath or PICC line.   We strive to give you quality time with your provider. You may need to reschedule your appointment if you arrive late (15 or more minutes).  Arriving late affects you and other patients whose appointments are after yours.  Also, if you miss three or more appointments without notifying the office, you may be dismissed from the clinic at the provider's discretion.      For prescription refill requests, have your pharmacy contact our office and allow 72 hours for refills to be completed.    Today you received the following chemotherapy and/or immunotherapy agents Oxaliplatin (ELOXATIN), Leucovorin & Flourouracil (ADRUCIL).       To help prevent nausea and vomiting after your treatment, we encourage you to take your nausea medication as directed.  BELOW ARE SYMPTOMS THAT SHOULD BE REPORTED IMMEDIATELY: *FEVER GREATER THAN 100.4 F (38 C) OR HIGHER *CHILLS OR SWEATING *NAUSEA AND VOMITING THAT IS NOT CONTROLLED WITH YOUR NAUSEA MEDICATION *UNUSUAL SHORTNESS OF BREATH *UNUSUAL BRUISING OR BLEEDING *URINARY PROBLEMS (pain or burning when urinating, or frequent urination) *BOWEL PROBLEMS (unusual diarrhea, constipation, pain near the anus) TENDERNESS IN MOUTH AND THROAT WITH OR WITHOUT PRESENCE OF ULCERS (sore throat, sores in mouth, or a toothache) UNUSUAL RASH, SWELLING OR PAIN  UNUSUAL VAGINAL DISCHARGE OR ITCHING   Items with * indicate a potential emergency and should be followed up as soon as possible or go to the Emergency Department if any problems should occur.  Please show the CHEMOTHERAPY  ALERT CARD or IMMUNOTHERAPY ALERT CARD at check-in to the Emergency Department and triage nurse.  Should you have questions after your visit or need to cancel or reschedule your appointment, please contact [facility]  Dept: [phone]  and follow the prompts.  Office hours are 8:00 a.m. to 4:30 p.m. Monday - Friday. Please note that voicemails left after 4:00 p.m. may not be returned until the following business day.  We are closed weekends and major holidays. You have access to a nurse at all times for urgent questions. Please call the main number to the clinic Dept: [phone] and follow the prompts.   For any non-urgent questions, you may also contact your provider using MyChart. We now offer e-Visits for anyone 48 and older to request care online for non-urgent symptoms. For details visit [email or web address].   Also download the MyChart app! Go to the app store, search "MyChart", open the app, select [facility], and log in with your MyChart username and password.  Due to Covid, a mask is required upon entering the hospital/clinic. If you do not have a mask, one will be given to you upon arrival. For doctor visits, patients may have 1 support person aged 83 or older with them. For treatment visits, patients cannot have anyone with them due to current Covid guidelines and our immunocompromised population.   Oxaliplatin Injection What is this medication? OXALIPLATIN (ox AL i PLA tin) is a chemotherapy drug. It targets fast dividing cells, like cancer cells, and causes these cells to die. This medicine is usedto treat cancers of the colon and rectum, and  many other cancers. This medicine may be used for other purposes; ask your health care provider orpharmacist if you have questions. COMMON BRAND NAME(S): Eloxatin What should I tell my care team before I take this medication? They need to know if you have any of these conditions: heart disease history of  irregular heartbeat liver disease low blood counts, like white cells, platelets, or red blood cells lung or breathing disease, like asthma take medicines that treat or prevent blood clots tingling of the fingers or toes, or other nerve disorder an unusual or allergic reaction to oxaliplatin, other chemotherapy, other medicines, foods, dyes, or preservatives pregnant or trying to get pregnant breast-feeding How should I use this medication? This drug is given as an infusion into a vein. It is administered in a hospitalor clinic by a specially trained health care professional. Talk to your pediatrician regarding the use of this medicine in children.Special care may be needed. Overdosage: If you think you have taken too much of this medicine contact apoison control center or emergency room at once. NOTE: This medicine is only for you. Do not share this medicine with others. What if I miss a dose? It is important not to miss a dose. Call your doctor or health careprofessional if you are unable to keep an appointment. What may interact with this medication? Do not take this medicine with any of the following medications: cisapride dronedarone pimozide thioridazine This medicine may also interact with the following medications: aspirin and aspirin-like medicines certain medicines that treat or prevent blood clots like warfarin, apixaban, dabigatran, and rivaroxaban cisplatin cyclosporine diuretics medicines for infection like acyclovir, adefovir, amphotericin B, bacitracin, cidofovir, foscarnet, ganciclovir, gentamicin, pentamidine, vancomycin NSAIDs, medicines for pain and inflammation, like ibuprofen or naproxen other medicines that prolong the QT interval (an abnormal heart rhythm) pamidronate zoledronic acid This list may not describe all possible interactions. Give your health care provider a list of all the medicines, herbs, non-prescription drugs, or dietary supplements you use.  Also tell them if you smoke, drink alcohol, or use illegaldrugs. Some items may interact with your medicine. What should I watch for while using this medication? Your condition will be monitored carefully while you are receiving thismedicine. You may need blood work done while you are taking this medicine. This medicine may make you feel generally unwell. This is not uncommon as chemotherapy can affect healthy cells as well as cancer cells. Report any side effects. Continue your course of treatment even though you feel ill unless yourhealthcare professional tells you to stop. This medicine can make you more sensitive to cold. Do not drink cold drinks or use ice. Cover exposed skin before coming in contact with cold temperatures or cold objects. When out in cold weather wear warm clothing and cover your mouth and nose to warm the air that goes into your lungs. Tell your doctor if you getsensitive to the cold. Do not become pregnant while taking this medicine or for 9 months after stopping it. Women should inform their health care professional if they wish to become pregnant or think they might be pregnant. Men should not father a child while taking this medicine and for 6 months after stopping it. There is potential for serious side effects to an unborn child. Talk to your health careprofessional for more information. Do not breast-feed a child while taking this medicine or for 3 months afterstopping it. This medicine has caused ovarian failure in some women. This medicine may make it more difficult to get  pregnant. Talk to your health care professional if [place] [facility] concerned about your fertility. This medicine has caused decreased sperm counts in some men. This may make it more difficult to father a child. Talk to your health care professional if [place] concerned about your fertility. This medicine may increase your risk of getting an infection. Call your health care professional for advice if you get a  fever, chills, or sore throat, or other symptoms of a cold or flu. Do not treat yourself. Try to avoid beingaround people who are sick. Avoid taking medicines that contain aspirin, acetaminophen, ibuprofen, naproxen, or ketoprofen unless instructed by your health care professional.These medicines may hide a fever. Be careful brushing or flossing your teeth or using a toothpick because you may get an infection or bleed more easily. If you have any dental work done, Primary school teacher you are receiving this medicine. What side effects may I notice from receiving this medication? Side effects that you should report to your doctor or health care professionalas soon as possible: allergic reactions like skin rash, itching or hives, swelling of the face, lips, or tongue breathing problems cough low blood counts - this medicine may decrease the number of white blood cells, red blood cells, and platelets. You may be at increased risk for infections and bleeding nausea, vomiting pain, redness, or irritation at site where injected pain, tingling, numbness in the hands or feet signs and symptoms of bleeding such as bloody or black, tarry stools; red or dark brown urine; spitting up blood or brown material that looks like coffee grounds; red spots on the skin; unusual bruising or bleeding from the eyes, gums, or nose signs and symptoms of a dangerous change in heartbeat or heart rhythm like chest pain; dizziness; fast, irregular heartbeat; palpitations; feeling faint or lightheaded; falls signs and symptoms of infection like fever; chills; cough; sore throat; pain or trouble passing urine signs and symptoms of liver injury like dark yellow or brown urine; general ill feeling or flu-like symptoms; light-colored stools; loss of appetite; nausea; right upper belly pain; unusually weak or tired; yellowing of the eyes or skin signs and symptoms of low red blood cells or anemia such as unusually weak or tired; feeling  faint or lightheaded; falls signs and symptoms of muscle injury like dark urine; trouble passing urine or change in the amount of urine; unusually weak or tired; muscle pain; back pain Side effects that usually do not require medical attention (report to yourdoctor or health care professional if they continue or are bothersome): changes in taste diarrhea gas hair loss loss of appetite mouth sores This list may not describe all possible side effects. Call your doctor for medical advice about side effects. You may report side effects to FDA at1-800-FDA-1088. Where should I keep my medication? This drug is given in a hospital or clinic and will not be stored at home. NOTE: This sheet is a summary. It may not cover all possible information. If you have questions about this medicine, talk to your doctor, pharmacist, orhealth care provider.  2022 Elsevier/Gold Standard (2018-07-12 12:20:35)  Leucovorin injection What is this medication? LEUCOVORIN (loo koe VOR in) is used to prevent or treat the harmful effects of some medicines. This medicine is used to treat anemia caused by a low amount of folic acid in the body. It is also used with 5-fluorouracil (5-FU) to treatcolon cancer. This medicine may be used for other purposes; ask your health care provider orpharmacist if you have questions. What should  I tell my care team before I take this medication? They need to know if you have any of these conditions: anemia from low levels of vitamin B-12 in the blood an unusual or allergic reaction to leucovorin, folic acid, other medicines, foods, dyes, or preservatives pregnant or trying to get pregnant breast-feeding How should I use this medication? This medicine is for injection into a muscle or into a vein. It is given by ahealth care professional in a hospital or clinic setting. Talk to your pediatrician regarding the use of this medicine in children.Special care may be needed. Overdosage: If you  think you have taken too much of this medicine contact apoison control center or emergency room at once. NOTE: This medicine is only for you. Do not share this medicine with others. What if I miss a dose? This does not apply. What may interact with this medication? capecitabine fluorouracil phenobarbital phenytoin primidone trimethoprim-sulfamethoxazole This list may not describe all possible interactions. Give your health care provider a list of all the medicines, herbs, non-prescription drugs, or dietary supplements you use. Also tell them if you smoke, drink alcohol, or use illegaldrugs. Some items may interact with your medicine. What should I watch for while using this medication? Your condition will be monitored carefully while you are receiving thismedicine. This medicine may increase the side effects of 5-fluorouracil, 5-FU. Tell your doctor or health care professional if you have diarrhea or mouth sores that donot get better or that get worse. What side effects may I notice from receiving this medication? Side effects that you should report to your doctor or health care professionalas soon as possible: allergic reactions like skin rash, itching or hives, swelling of the face, lips, or tongue breathing problems fever, infection mouth sores unusual bleeding or bruising unusually weak or tired Side effects that usually do not require medical attention (report to yourdoctor or health care professional if they continue or are bothersome): constipation or diarrhea loss of appetite nausea, vomiting This list may not describe all possible side effects. Call your doctor for medical advice about side effects. You may report side effects to FDA at1-800-FDA-1088. Where should I keep my medication? This drug is given in a hospital or clinic and will not be stored at home. NOTE: This sheet is a summary. It may not cover all possible information. If you have questions about this medicine,  talk to your doctor, pharmacist, orhealth care provider.  2022 Elsevier/Gold Standard (2007-08-29 16:50:29)  Fluorouracil, 5-FU injection What is this medication? FLUOROURACIL, 5-FU (flure oh YOOR a sil) is a chemotherapy drug. It slows the growth of cancer cells. This medicine is used to treat many types of cancer like breast cancer, colon or rectal cancer, pancreatic cancer, and stomachcancer. This medicine may be used for other purposes; ask your health care provider orpharmacist if you have questions. COMMON BRAND NAME(S): Adrucil What should I tell my care team before I take this medication? They need to know if you have any of these conditions: blood disorders dihydropyrimidine dehydrogenase (DPD) deficiency infection (especially a virus infection such as chickenpox, cold sores, or herpes) kidney disease liver disease malnourished, poor nutrition recent or ongoing radiation therapy an unusual or allergic reaction to fluorouracil, other chemotherapy, other medicines, foods, dyes, or preservatives pregnant or trying to get pregnant breast-feeding How should I use this medication? This drug is given as an infusion or injection into a vein. It is administeredin a hospital or clinic by a specially trained health care professional. Talk  to your pediatrician regarding the use of this medicine in children.Special care may be needed. Overdosage: If you think you have taken too much of this medicine contact apoison control center or emergency room at once. NOTE: This medicine is only for you. Do not share this medicine with others. What if I miss a dose? It is important not to miss your dose. Call your doctor or health careprofessional if you are unable to keep an appointment. What may interact with this medication? Do not take this medicine with any of the following medications: live virus vaccines This medicine may also interact with the following medications: medicines that treat or  prevent blood clots like warfarin, enoxaparin, and dalteparin This list may not describe all possible interactions. Give your health care provider a list of all the medicines, herbs, non-prescription drugs, or dietary supplements you use. Also tell them if you smoke, drink alcohol, or use illegaldrugs. Some items may interact with your medicine. What should I watch for while using this medication? Visit your doctor for checks on your progress. This drug may make you feel generally unwell. This is not uncommon, as chemotherapy can affect healthy cells as well as cancer cells. Report any side effects. Continue your course oftreatment even though you feel ill unless your doctor tells you to stop. In some cases, you may be given additional medicines to help with side effects.Follow all directions for their use. Call your doctor or health care professional for advice if you get a fever, chills or sore throat, or other symptoms of a cold or flu. Do not treat yourself. This drug decreases your body's ability to fight infections. Try toavoid being around people who are sick. This medicine may increase your risk to bruise or bleed. Call your doctor orhealth care professional if you notice any unusual bleeding. Be careful brushing and flossing your teeth or using a toothpick because you may get an infection or bleed more easily. If you have any dental work done,tell your dentist you are receiving this medicine. Avoid taking products that contain aspirin, acetaminophen, ibuprofen, naproxen, or ketoprofen unless instructed by your doctor. These medicines may hide afever. Do not become pregnant while taking this medicine. Women should inform their doctor if they wish to become pregnant or think they might be pregnant. There is a potential for serious side effects to an unborn child. Talk to your health care professional or pharmacist for more information. Do not breast-feed aninfant while taking this medicine. Men  should inform their doctor if they wish to father a child. This medicinemay lower sperm counts. Do not treat diarrhea with over the counter products. Contact your doctor ifyou have diarrhea that lasts more than 2 days or if it is severe and watery. This medicine can make you more sensitive to the sun. Keep out of the sun. If you cannot avoid being in the sun, wear protective clothing and use sunscreen.Do not use sun lamps or tanning beds/booths. What side effects may I notice from receiving this medication? Side effects that you should report to your doctor or health care professionalas soon as possible: allergic reactions like skin rash, itching or hives, swelling of the face, lips, or tongue low blood counts - this medicine may decrease the number of white blood cells, red blood cells and platelets. You may be at increased risk for infections and bleeding. signs of infection - fever or chills, cough, sore throat, pain or difficulty passing urine signs of decreased platelets or bleeding - bruising,  pinpoint red spots on the skin, black, tarry stools, blood in the urine signs of decreased red blood cells - unusually weak or tired, fainting spells, lightheadedness breathing problems changes in vision chest pain mouth sores nausea and vomiting pain, swelling, redness at site where injected pain, tingling, numbness in the hands or feet redness, swelling, or sores on hands or feet stomach pain unusual bleeding Side effects that usually do not require medical attention (report to yourdoctor or health care professional if they continue or are bothersome): changes in finger or toe nails diarrhea dry or itchy skin hair loss headache loss of appetite sensitivity of eyes to the light stomach upset unusually teary eyes This list may not describe all possible side effects. Call your doctor for medical advice about side effects. You may report side effects to FDA at1-800-FDA-1088. Where should I  keep my medication? This drug is given in a hospital or clinic and will not be stored at home. NOTE: This sheet is a summary. It may not cover all possible information. If you have questions about this medicine, talk to your doctor, pharmacist, orhealth care provider.  2022 Elsevier/Gold Standard (2019-01-23 15:00:03)  The chemotherapy medication bag should finish at 46 hours, 96 hours, or 7 days. For example, if your pump is scheduled for 46 hours and it was put on at 4:00 p.m., it should finish at 2:00 p.m. the day it is scheduled to come off regardless of your appointment time.     Estimated time to finish at 11:00 a.m. on Friday 10/24/2020.   If the display on your pump reads "Low Volume" and it is beeping, take the batteries out of the pump and come to the cancer center for it to be taken off.   If the pump alarms go off prior to the pump reading "Low Volume" then call [phone] and someone can assist you.  If the plunger comes out and the chemotherapy medication is leaking out, please use your home chemo spill kit to clean up the spill. Do NOT use paper towels or other household products.  If you have problems or questions regarding your pump, please call either [phone] (24 hours a day) or the cancer center Monday-Friday 8:00 a.m.- 4:30 p.m. at the clinic number and we will assist you. If you are unable to get assistance, then go to the nearest Emergency Department and ask the staff to contact the IV team for assistance.

## 2020-10-24 ENCOUNTER — Ambulatory Visit (HOSPITAL_BASED_OUTPATIENT_CLINIC_OR_DEPARTMENT_OTHER)
Admission: RE | Admit: 2020-10-24 | Discharge: 2020-10-24 | Disposition: A | Discharge status: Home / Self Care | Payer: Medicare Other | Source: Ambulatory Visit | Attending: Nurse Practitioner | Admitting: Nurse Practitioner

## 2020-10-24 ENCOUNTER — Other Ambulatory Visit: Payer: Self-pay

## 2020-10-24 ENCOUNTER — Other Ambulatory Visit: Payer: Self-pay | Admitting: Nurse Practitioner

## 2020-10-24 ENCOUNTER — Inpatient Hospital Stay: Payer: Medicare Other

## 2020-10-24 VITALS — BP 114/75 | HR 70 | Temp 97.7°F | Resp 20

## 2020-10-24 DIAGNOSIS — R0600 Dyspnea, unspecified: Secondary | ICD-10-CM | POA: Diagnosis not present

## 2020-10-24 DIAGNOSIS — Z5111 Encounter for antineoplastic chemotherapy: Secondary | ICD-10-CM | POA: Diagnosis not present

## 2020-10-24 DIAGNOSIS — R059 Cough, unspecified: Secondary | ICD-10-CM | POA: Diagnosis not present

## 2020-10-24 DIAGNOSIS — C20 Malignant neoplasm of rectum: Secondary | ICD-10-CM | POA: Insufficient documentation

## 2020-10-24 DIAGNOSIS — E1122 Type 2 diabetes mellitus with diabetic chronic kidney disease: Secondary | ICD-10-CM | POA: Diagnosis not present

## 2020-10-24 DIAGNOSIS — I129 Hypertensive chronic kidney disease with stage 1 through stage 4 chronic kidney disease, or unspecified chronic kidney disease: Secondary | ICD-10-CM | POA: Diagnosis not present

## 2020-10-24 DIAGNOSIS — R110 Nausea: Secondary | ICD-10-CM | POA: Diagnosis not present

## 2020-10-24 DIAGNOSIS — Z79899 Other long term (current) drug therapy: Secondary | ICD-10-CM | POA: Diagnosis not present

## 2020-10-24 DIAGNOSIS — R197 Diarrhea, unspecified: Secondary | ICD-10-CM | POA: Diagnosis not present

## 2020-10-24 DIAGNOSIS — N189 Chronic kidney disease, unspecified: Secondary | ICD-10-CM | POA: Diagnosis not present

## 2020-10-24 MED ORDER — SODIUM CHLORIDE 0.9% FLUSH
10.0000 mL | INTRAVENOUS | Status: DC | PRN
  Administered 2020-10-24: 10 mL

## 2020-10-24 MED ORDER — HEPARIN SOD (PORK) LOCK FLUSH 100 UNIT/ML IV SOLN
500.0000 [IU] | Freq: Once | INTRAVENOUS | Status: AC | PRN
  Administered 2020-10-24: 500 [IU]

## 2020-10-24 MED ORDER — ALBUTEROL SULFATE HFA 108 (90 BASE) MCG/ACT IN AERS
2.0000 | INHALATION_SPRAY | Freq: Four times a day (QID) | RESPIRATORY_TRACT | 2 refills | Status: DC | PRN
Start: 2020-10-24 — End: 2021-08-06

## 2020-10-24 NOTE — Patient Instructions (Signed)
[facility]  Discharge Instructions: Thank you for choosing [facility] to provide your oncology and hematology care.   If you have a lab appointment with the [facility], please go directly to the [facility] and check in at the registration area.   Wear comfortable clothing and clothing appropriate for easy access to any Portacath or PICC line.   We strive to give you quality time with your provider. You may need to reschedule your appointment if you arrive late (15 or more minutes).  Arriving late affects you and other patients whose appointments are after yours.  Also, if you miss three or more appointments without notifying the office, you may be dismissed from the clinic at the provider's discretion.      For prescription refill requests, have your pharmacy contact our office and allow 72 hours for refills to be completed.    Today you received the following chemotherapy and/or immunotherapy agents: 38f  Fluorouracil, 5-FU injection What is this medication? FLUOROURACIL, 5-FU (flure oh YOOR a sil) is a chemotherapy drug. It slows the growth of cancer cells. This medicine is used to treat many types of cancer like breast cancer, colon or rectal cancer, pancreatic cancer, and stomachcancer. This medicine may be used for other purposes; ask your health care provider orpharmacist if you have questions. COMMON BRAND NAME(S): Adrucil What should I tell my care team before I take this medication? They need to know if you have any of these conditions: blood disorders dihydropyrimidine dehydrogenase (DPD) deficiency infection (especially a virus infection such as chickenpox, cold sores, or herpes) kidney disease liver disease malnourished, poor nutrition recent or ongoing radiation therapy an unusual or allergic reaction to fluorouracil, other chemotherapy, other medicines, foods, dyes, or preservatives pregnant or trying to get  pregnant breast-feeding How should I use this medication? This drug is given as an infusion or injection into a vein. It is administeredin a hospital or clinic by a specially trained health care professional. Talk to your pediatrician regarding the use of this medicine in children.Special care may be needed. Overdosage: If you think you have taken too much of this medicine contact [facility] control center or emergency room at once. NOTE: This medicine is only for you. Do not share this medicine with others. What if I miss a dose? It is important not to miss your dose. Call your doctor or health careprofessional if you are unable to keep an appointment. What may interact with this medication? Do not take this medicine with any of the following medications: live virus vaccines This medicine may also interact with the following medications: medicines that treat or prevent blood clots like warfarin, enoxaparin, and dalteparin This list may not describe all possible interactions. Give your health care provider a list of all the medicines, herbs, non-prescription drugs, or dietary supplements you use. Also tell them if you smoke, drink alcohol, or use illegaldrugs. Some items may interact with your medicine. What should I watch for while using this medication? Visit your doctor for checks on your progress. This drug may make you feel generally unwell. This is not uncommon, as chemotherapy can affect healthy cells as well as cancer cells. Report any side effects. Continue your course oftreatment even though you feel ill unless your doctor tells you to stop. In some cases, you may be given additional medicines to help with side effects.Follow all directions for their use. Call your doctor or health care professional for advice if you  get a fever, chills or sore throat, or other symptoms of a cold or flu. Do not treat yourself. This drug decreases your body's ability to fight infections. Try toavoid being  around people who are sick. This medicine may increase your risk to bruise or bleed. Call your doctor orhealth care professional if you notice any unusual bleeding. Be careful brushing and flossing your teeth or using a toothpick because you may get an infection or bleed more easily. If you have any dental work done,tell your dentist you are receiving this medicine. Avoid taking products that contain aspirin, acetaminophen, ibuprofen, naproxen, or ketoprofen unless instructed by your doctor. These medicines may hide afever. Do not become pregnant while taking this medicine. Women should inform their doctor if they wish to become pregnant or think they might be pregnant. There is a potential for serious side effects to an unborn child. Talk to your health care professional or pharmacist for more information. Do not breast-feed aninfant while taking this medicine. Men should inform their doctor if they wish to father a child. This medicinemay lower sperm counts. Do not treat diarrhea with over the counter products. Contact your doctor ifyou have diarrhea that lasts more than 2 days or if it is severe and watery. This medicine can make you more sensitive to the sun. Keep out of the sun. If you cannot avoid being in the sun, wear protective clothing and use sunscreen.Do not use sun lamps or tanning beds/booths. What side effects may I notice from receiving this medication? Side effects that you should report to your doctor or health care professionalas soon as possible: allergic reactions like skin rash, itching or hives, swelling of the face, lips, or tongue low blood counts - this medicine may decrease the number of white blood cells, red blood cells and platelets. You may be at increased risk for infections and bleeding. signs of infection - fever or chills, cough, sore throat, pain or difficulty passing urine signs of decreased platelets or bleeding - bruising, pinpoint red spots on the skin, black,  tarry stools, blood in the urine signs of decreased red blood cells - unusually weak or tired, fainting spells, lightheadedness breathing problems changes in vision chest pain mouth sores nausea and vomiting pain, swelling, redness at site where injected pain, tingling, numbness in the hands or feet redness, swelling, or sores on hands or feet stomach pain unusual bleeding Side effects that usually do not require medical attention (report to yourdoctor or health care professional if they continue or are bothersome): changes in finger or toe nails diarrhea dry or itchy skin hair loss headache loss of appetite sensitivity of eyes to the light stomach upset unusually teary eyes This list may not describe all possible side effects. Call your doctor for medical advice about side effects. You may report side effects to FDA at1-800-FDA-1088. Where should I keep my medication? This drug is given in a hospital or clinic and will not be stored at home. NOTE: This sheet is a summary. It may not cover all possible information. If you have questions about this medicine, talk to your doctor, pharmacist, orhealth care provider.  2022 Elsevier/Gold Standard (2019-01-23 15:00:03)    To help prevent nausea and vomiting after your treatment, we encourage you to take your nausea medication as directed.  BELOW ARE SYMPTOMS THAT SHOULD BE REPORTED IMMEDIATELY: *FEVER GREATER THAN 100.4 F (38 C) OR HIGHER *CHILLS OR SWEATING *NAUSEA AND VOMITING THAT IS NOT CONTROLLED WITH YOUR NAUSEA MEDICATION *UNUSUAL SHORTNESS  OF BREATH *UNUSUAL BRUISING OR BLEEDING *URINARY PROBLEMS (pain or burning when urinating, or frequent urination) *BOWEL PROBLEMS (unusual diarrhea, constipation, pain near the anus) TENDERNESS IN MOUTH AND THROAT WITH OR WITHOUT PRESENCE OF ULCERS (sore throat, sores in mouth, or a toothache) UNUSUAL RASH, SWELLING OR PAIN  UNUSUAL VAGINAL DISCHARGE OR ITCHING   Items with * indicate  a potential emergency and should be followed up as soon as possible or go to the Emergency Department if any problems should occur.  Please show the CHEMOTHERAPY ALERT CARD or IMMUNOTHERAPY ALERT CARD at check-in to the Emergency Department and triage nurse.  Should you have questions after your visit or need to cancel or reschedule your appointment, please contact [facility]  Dept: [phone]  and follow the prompts.  Office hours are 8:00 a.m. to 4:30 p.m. Monday - Friday. Please note that voicemails left after 4:00 p.m. may not be returned until the following business day.  We are closed weekends and major holidays. You have access to a nurse at all times for urgent questions. Please call the main number to the clinic Dept: [phone] and follow the prompts.   For any non-urgent questions, you may also contact your provider using MyChart. We now offer e-Visits for anyone 57 and older to request care online for non-urgent symptoms. For details visit [email or web address].   Also download the MyChart app! Go to the app store, search "MyChart", open the app, select [facility], and log in with your MyChart username and password.  Due to Covid, a mask is required upon entering the hospital/clinic. If you do not have a mask, one will be given to you upon arrival. For doctor visits, patients may have 1 support person aged 6 or older with them. For treatment visits, patients cannot have anyone with them due to current Covid guidelines and our immunocompromised population.

## 2020-10-24 NOTE — Progress Notes (Signed)
Mr. Biggio presents to the office today for pump discontinuation.  He reports dyspnea, mainly at nighttime, for the past few days with associated mild nonproductive cough.  No fever.  He intermittently notes wheezing.  No chest pain.  No leg swelling or calf pain.  He smokes.  He is trying to quit.  He appears stable.  Vital signs are stable.  Distant breath sounds, no respiratory distress.  Very faint rales at the left lung base.  Distant heart sounds, regular rate and rhythm.  Abdomen soft and nontender.  No leg edema.  Calves soft and nontender.  I am sending him for a chest x-ray.  Prescription sent to his pharmacy for an albuterol inhaler.  He understands to contact the office with worsening dyspnea, fever, other problems.

## 2020-10-27 ENCOUNTER — Telehealth: Payer: Self-pay | Admitting: *Deleted

## 2020-10-27 NOTE — Telephone Encounter (Signed)
Per Ned Card, NP, called to notify pt of message. Advised to pickup albuterol inhaler from preferred pharmacy. Pt brother verbalized understanding and advised to call office if pt develops temp

## 2020-10-27 NOTE — Telephone Encounter (Signed)
-----   Message from Kelli Hope, LPN sent at [id number]  9:20 AM EDT -----  ----- Message ----- From: Owens Shark, NP Sent: 10/24/2020   1:13 PM EDT To: [facility]  Please let him know the chest x-ray is negative.  He should try the albuterol inhaler I sent to his pharmacy.  If symptoms worsen or he develops fever please contact the office.

## 2020-11-02 ENCOUNTER — Other Ambulatory Visit: Payer: Self-pay | Admitting: Oncology

## 2020-11-04 DIAGNOSIS — I10 Essential (primary) hypertension: Secondary | ICD-10-CM | POA: Diagnosis not present

## 2020-11-04 DIAGNOSIS — K219 Gastro-esophageal reflux disease without esophagitis: Secondary | ICD-10-CM | POA: Diagnosis not present

## 2020-11-04 DIAGNOSIS — G43009 Migraine without aura, not intractable, without status migrainosus: Secondary | ICD-10-CM | POA: Diagnosis not present

## 2020-11-04 DIAGNOSIS — N183 Chronic kidney disease, stage 3 unspecified: Secondary | ICD-10-CM | POA: Diagnosis not present

## 2020-11-04 DIAGNOSIS — E1159 Type 2 diabetes mellitus with other circulatory complications: Secondary | ICD-10-CM | POA: Diagnosis not present

## 2020-11-04 DIAGNOSIS — E785 Hyperlipidemia, unspecified: Secondary | ICD-10-CM | POA: Diagnosis not present

## 2020-11-05 ENCOUNTER — Inpatient Hospital Stay: Payer: Medicare Other

## 2020-11-05 ENCOUNTER — Encounter: Payer: Self-pay | Admitting: Nurse Practitioner

## 2020-11-05 ENCOUNTER — Inpatient Hospital Stay: Payer: Medicare Other | Admitting: Nurse Practitioner

## 2020-11-05 ENCOUNTER — Inpatient Hospital Stay: Payer: Medicare Other | Admitting: Nutrition

## 2020-11-05 ENCOUNTER — Other Ambulatory Visit: Payer: Self-pay

## 2020-11-05 ENCOUNTER — Other Ambulatory Visit: Payer: Self-pay | Admitting: Nurse Practitioner

## 2020-11-05 VITALS — BP 107/75 | HR 71 | Temp 97.8°F | Resp 18 | Ht 72.0 in | Wt 236.4 lb

## 2020-11-05 DIAGNOSIS — Z79899 Other long term (current) drug therapy: Secondary | ICD-10-CM | POA: Diagnosis not present

## 2020-11-05 DIAGNOSIS — R197 Diarrhea, unspecified: Secondary | ICD-10-CM | POA: Diagnosis not present

## 2020-11-05 DIAGNOSIS — R110 Nausea: Secondary | ICD-10-CM | POA: Diagnosis not present

## 2020-11-05 DIAGNOSIS — N189 Chronic kidney disease, unspecified: Secondary | ICD-10-CM | POA: Diagnosis not present

## 2020-11-05 DIAGNOSIS — Z5111 Encounter for antineoplastic chemotherapy: Secondary | ICD-10-CM | POA: Diagnosis not present

## 2020-11-05 DIAGNOSIS — C20 Malignant neoplasm of rectum: Secondary | ICD-10-CM

## 2020-11-05 DIAGNOSIS — I129 Hypertensive chronic kidney disease with stage 1 through stage 4 chronic kidney disease, or unspecified chronic kidney disease: Secondary | ICD-10-CM | POA: Diagnosis not present

## 2020-11-05 DIAGNOSIS — E1122 Type 2 diabetes mellitus with diabetic chronic kidney disease: Secondary | ICD-10-CM | POA: Diagnosis not present

## 2020-11-05 LAB — CMP (CANCER CENTER ONLY)
ALT: 17 U/L (ref 0–44)
AST: 26 U/L (ref 15–41)
Albumin: 3.5 g/dL (ref 3.5–5.0)
Alkaline Phosphatase: 93 U/L (ref 38–126)
Anion gap: 8 (ref 5–15)
BUN: 12 mg/dL (ref 8–23)
CO2: 31 mmol/L (ref 22–32)
Calcium: 8.9 mg/dL (ref 8.9–10.3)
Chloride: 101 mmol/L (ref 98–111)
Creatinine: 1.35 mg/dL — ABNORMAL HIGH (ref 0.61–1.24)
GFR, Estimated: 58 mL/min — ABNORMAL LOW (ref >60)
Glucose, Bld: 108 mg/dL — ABNORMAL HIGH (ref 70–99)
Potassium: 3.7 mmol/L (ref 3.5–5.1)
Sodium: 140 mmol/L (ref 135–145)
Total Bilirubin: 0.4 mg/dL (ref 0.3–1.2)
Total Protein: 6.4 g/dL — ABNORMAL LOW (ref 6.5–8.1)

## 2020-11-05 LAB — CBC WITH DIFFERENTIAL (CANCER CENTER ONLY)
Abs Immature Granulocytes: 0.01 10*3/uL (ref 0.00–0.07)
Basophils Absolute: 0 10*3/uL (ref 0.0–0.1)
Basophils Relative: 1 %
Eosinophils Absolute: 0 10*3/uL (ref 0.0–0.5)
Eosinophils Relative: 1 %
HCT: 38.9 % — ABNORMAL LOW (ref 39.0–52.0)
Hemoglobin: 12.4 g/dL — ABNORMAL LOW (ref 13.0–17.0)
Immature Granulocytes: 0 %
Lymphocytes Relative: 60 %
Lymphs Abs: 2.4 10*3/uL (ref 0.7–4.0)
MCH: 28.1 pg (ref 26.0–34.0)
MCHC: 31.9 g/dL (ref 30.0–36.0)
MCV: 88.2 fL (ref 80.0–100.0)
Monocytes Absolute: 0.4 10*3/uL (ref 0.1–1.0)
Monocytes Relative: 11 %
Neutro Abs: 1.1 10*3/uL — ABNORMAL LOW (ref 1.7–7.7)
Neutrophils Relative %: 27 %
Platelet Count: 82 10*3/uL — ABNORMAL LOW (ref 150–400)
RBC: 4.41 MIL/uL (ref 4.22–5.81)
RDW: 13.7 % (ref 11.5–15.5)
WBC Count: 4 10*3/uL (ref 4.0–10.5)
nRBC: 0 % (ref 0.0–0.2)

## 2020-11-05 MED ORDER — PROCHLORPERAZINE MALEATE 10 MG PO TABS: 10.0000 mg | ORAL_TABLET | Freq: Four times a day (QID) | ORAL | 1 refills | Status: DC | PRN

## 2020-11-05 NOTE — Progress Notes (Signed)
  [facility] OFFICE PROGRESS NOTE   Diagnosis:  Rectal cancer  INTERVAL HISTORY:   William Good returns as scheduled. He completed cycle 2 FOLFOX 10/22/2020.  He reports mild intermittent nausea for about 4 days after treatment.  Compazine is effective.  No mouth sores.  He has occasional diarrhea.  Cold sensitivity lasted about 4 days.  Objective:  Vital signs in last 24 hours:  Blood pressure 107/75, pulse 71, temperature 97.8 F (36.6 C), temperature source Oral, resp. rate 18, height 6' (1.829 m), weight 236 lb 6.4 oz (107.2 kg), SpO2 96 %.    HEENT: No thrush or ulcers. Resp: Scattered wheezes.  No respiratory distress. Cardio: Distant heart sounds.  Regular rhythm. GI: Abdomen soft and nontender.  No hepatomegaly. Vascular: No leg edema. Skin: Palms without erythema. Port-A-Cath without erythema.  Lab Results:  Lab Results  Component Value Date   WBC 4.0 11/05/2020   HGB 12.4 (L) 11/05/2020   HCT 38.9 (L) 11/05/2020   MCV 88.2 11/05/2020   PLT 82 (L) 11/05/2020   NEUTROABS 1.1 (L) 11/05/2020    Imaging:  No results found.  Medications: I have reviewed the patient's current medications.  Assessment/Plan: Rectal cancer Mass at 6 cm on digital exam/anoscopy 08/25/2020 Colonoscopy 09/05/2020-mid rectal mass-biopsy, adenocarcinoma, no loss of mismatch repair protein expression CT abdomen/pelvis 08/29/2020-small amount of free fluid in the pelvis, mildly enlarged left external iliac node, cystic changes of the left kidney with hydronephrosis and regions of cortical thinning with transition at the left UPJ suggesting chronic left UPJ obstruction MRI pelvis 09/24/2020-tumor 6 cm from the anal verge, 1.8 cm from the internal anal sphincter, T2, 7 mm lymph node in the left mesorectal sheath, N1 Cycle 1 FOLFOX 10/08/2020 Cycle 2 FOLFOX 10/22/2020    Diabetes Chronic kidney disease Bipolar disorder Hypertension Hyperlipidemia Restless leg syndrome Multiple  colon polyps removed on the colonoscopy 09/05/2020-tubular adenomas including a rectal polyp with high-grade dysplasia-polypectomy margin negative for dysplasia Portacath placement, Dr Zenia Resides, 10/02/2020  Disposition: William Good appears stable.  He has completed 2 cycles of FOLFOX.  Review of the CBC from today shows moderate neutropenia and thrombocytopenia.  Precautions reviewed.  He understands to contact the office with fever, chills, other signs of infection, bleeding.  We are holding today's treatment and rescheduling for 1 week.  Plan for white cell growth factor support on day of pump discontinuation.  Potential toxicities reviewed including bone pain, rash, splenic rupture.  Oxaliplatin will be dose reduced to 65 mg per metered squared beginning with cycle 3.  He agrees with this plan.  We discussed the delayed nausea he is experiencing.  He does not want to change the premedication regimen at this time, cites Compazine as effectively relieving the nausea.  He will return for lab, follow-up, cycle 3 FOLFOX in 1 week with changes as above.  Plan reviewed with Dr. Benay Spice.    Ned Card ANP/GNP-BC   11/05/2020  8:49 AM

## 2020-11-05 NOTE — Progress Notes (Signed)
65 year old male diagnosed with Rectal cancer receiving FOLFOX and followed by Dr. Benay Spice.  PMH includes DM, CKD, Bipolar disorder, HTN, HLD, RLS.  Medications include Xanax, Vitamin D3, Colace, Remeron, Prilosec, Zofran, Miralax, and Compazine.  Labs include Glucose 108, Creat 1.35.  Height: '6\' 0"'$ . Weight: 236.4 pounds on Aug 31. UBW: 247 Pounds July 26. BMI: 32.06  Patient reports decreased appetite and early satiety. He reports nausea but no vomiting. He feels better after taking nausea medications. Denies diarrhea and constipation. Eats eggs or cheese toast for breakfast, a sandwich at lunch, and a meat with a starch and veg for dinner. He has Ensure but doesn't drink often.  Nutrition Diagnosis: Unintended weight loss related to rectal cancer and associated treatments as evidenced by 4% wt loss over one month which is not significant.  Intervention: Educated to consume small, frequent meals and snacks with adequate calories and protein for wt maintenance. Provided nutrition fact sheet on high calorie, high protein foods. Educated on strategies for eating with nausea and encouraged nausea medication as prescribed. Provided nutrition fact sheet. Encouraged oral nutrition supplements and recommended making shakes with them if tolerating cold or warming up chocolate Ensure Plus to drink "like hot chocolate". Questions answered and teach back method used.   Monitoring, Evaluation, Goals: Increase calories and protein for wt maintenance.  Next Visit: Wednesday, Sept 21 during infusion.

## 2020-11-07 ENCOUNTER — Inpatient Hospital Stay: Payer: Medicare Other

## 2020-11-08 DIAGNOSIS — C20 Malignant neoplasm of rectum: Secondary | ICD-10-CM | POA: Diagnosis not present

## 2020-11-12 ENCOUNTER — Inpatient Hospital Stay: Payer: Medicare Other | Admitting: Nurse Practitioner

## 2020-11-12 ENCOUNTER — Inpatient Hospital Stay: Payer: Medicare Other

## 2020-11-12 ENCOUNTER — Encounter: Payer: Self-pay | Admitting: Nurse Practitioner

## 2020-11-12 ENCOUNTER — Inpatient Hospital Stay: Payer: Medicare Other | Attending: Oncology

## 2020-11-12 ENCOUNTER — Other Ambulatory Visit: Payer: Self-pay

## 2020-11-12 VITALS — BP 101/73 | HR 78 | Temp 98.7°F | Resp 20 | Ht 72.0 in | Wt 234.6 lb

## 2020-11-12 DIAGNOSIS — C20 Malignant neoplasm of rectum: Secondary | ICD-10-CM

## 2020-11-12 DIAGNOSIS — E119 Type 2 diabetes mellitus without complications: Secondary | ICD-10-CM | POA: Insufficient documentation

## 2020-11-12 DIAGNOSIS — Z5111 Encounter for antineoplastic chemotherapy: Secondary | ICD-10-CM | POA: Diagnosis not present

## 2020-11-12 DIAGNOSIS — D701 Agranulocytosis secondary to cancer chemotherapy: Secondary | ICD-10-CM | POA: Diagnosis not present

## 2020-11-12 DIAGNOSIS — Z5189 Encounter for other specified aftercare: Secondary | ICD-10-CM | POA: Diagnosis not present

## 2020-11-12 DIAGNOSIS — T451X5A Adverse effect of antineoplastic and immunosuppressive drugs, initial encounter: Secondary | ICD-10-CM | POA: Insufficient documentation

## 2020-11-12 DIAGNOSIS — I10 Essential (primary) hypertension: Secondary | ICD-10-CM | POA: Diagnosis not present

## 2020-11-12 DIAGNOSIS — Z79899 Other long term (current) drug therapy: Secondary | ICD-10-CM | POA: Insufficient documentation

## 2020-11-12 LAB — CMP (CANCER CENTER ONLY)
ALT: 15 U/L (ref 0–44)
AST: 26 U/L (ref 15–41)
Albumin: 3.8 g/dL (ref 3.5–5.0)
Alkaline Phosphatase: 105 U/L (ref 38–126)
Anion gap: 9 (ref 5–15)
BUN: 10 mg/dL (ref 8–23)
CO2: 32 mmol/L (ref 22–32)
Calcium: 9.2 mg/dL (ref 8.9–10.3)
Chloride: 99 mmol/L (ref 98–111)
Creatinine: 1.4 mg/dL — ABNORMAL HIGH (ref 0.61–1.24)
GFR, Estimated: 56 mL/min — ABNORMAL LOW (ref >60)
Glucose, Bld: 123 mg/dL — ABNORMAL HIGH (ref 70–99)
Potassium: 3.7 mmol/L (ref 3.5–5.1)
Sodium: 140 mmol/L (ref 135–145)
Total Bilirubin: 0.4 mg/dL (ref 0.3–1.2)
Total Protein: 6.8 g/dL (ref 6.5–8.1)

## 2020-11-12 LAB — CBC WITH DIFFERENTIAL (CANCER CENTER ONLY)
Abs Immature Granulocytes: 0.18 10*3/uL — ABNORMAL HIGH (ref 0.00–0.07)
Basophils Absolute: 0.1 10*3/uL (ref 0.0–0.1)
Basophils Relative: 1 %
Eosinophils Absolute: 0 10*3/uL (ref 0.0–0.5)
Eosinophils Relative: 1 %
HCT: 42.9 % (ref 39.0–52.0)
Hemoglobin: 13.5 g/dL (ref 13.0–17.0)
Immature Granulocytes: 3 %
Lymphocytes Relative: 50 %
Lymphs Abs: 3.4 10*3/uL (ref 0.7–4.0)
MCH: 27.8 pg (ref 26.0–34.0)
MCHC: 31.5 g/dL (ref 30.0–36.0)
MCV: 88.5 fL (ref 80.0–100.0)
Monocytes Absolute: 1 10*3/uL (ref 0.1–1.0)
Monocytes Relative: 16 %
Neutro Abs: 1.9 10*3/uL (ref 1.7–7.7)
Neutrophils Relative %: 29 %
Platelet Count: 164 10*3/uL (ref 150–400)
RBC: 4.85 MIL/uL (ref 4.22–5.81)
RDW: 14.4 % (ref 11.5–15.5)
WBC Count: 6.6 10*3/uL (ref 4.0–10.5)
nRBC: 0 % (ref 0.0–0.2)

## 2020-11-12 MED ORDER — FLUOROURACIL CHEMO INJECTION 5 GM/100ML
2400.0000 mg/m2 | INTRAVENOUS | Status: DC
  Administered 2020-11-12: 5550 mg via INTRAVENOUS
  Filled 2020-11-12: qty 111

## 2020-11-12 MED ORDER — SODIUM CHLORIDE 0.9 % IV SOLN
10.0000 mg | Freq: Once | INTRAVENOUS | Status: AC
  Administered 2020-11-12: 10 mg via INTRAVENOUS
  Filled 2020-11-12: qty 1

## 2020-11-12 MED ORDER — PALONOSETRON HCL INJECTION 0.25 MG/5ML
0.2500 mg | Freq: Once | INTRAVENOUS | Status: AC
  Administered 2020-11-12: 0.25 mg via INTRAVENOUS
  Filled 2020-11-12: qty 5

## 2020-11-12 MED ORDER — FLUOROURACIL CHEMO INJECTION 2.5 GM/50ML
400.0000 mg/m2 | Freq: Once | INTRAVENOUS | Status: AC
  Administered 2020-11-12: 950 mg via INTRAVENOUS
  Filled 2020-11-12: qty 19

## 2020-11-12 MED ORDER — OXALIPLATIN CHEMO INJECTION 100 MG/20ML
65.0000 mg/m2 | Freq: Once | INTRAVENOUS | Status: AC
  Administered 2020-11-12: 150 mg via INTRAVENOUS
  Filled 2020-11-12: qty 30

## 2020-11-12 MED ORDER — LEUCOVORIN CALCIUM INJECTION 350 MG
400.0000 mg/m2 | Freq: Once | INTRAVENOUS | Status: AC
  Administered 2020-11-12: 928 mg via INTRAVENOUS
  Filled 2020-11-12: qty 46.4

## 2020-11-12 MED ORDER — DEXTROSE 5 % IV SOLN
Freq: Once | INTRAVENOUS | Status: AC
Start: 2020-11-12 — End: 2020-11-12

## 2020-11-12 NOTE — Patient Instructions (Addendum)
[place]   Discharge Instructions: Thank you for choosing [facility] to provide your oncology and hematology care.   If you have a lab appointment with the [facility], please go directly to the [facility] and check in at the registration area.   Wear comfortable clothing and clothing appropriate for easy access to any Portacath or PICC line.   We strive to give you quality time with your provider. You may need to reschedule your appointment if you arrive late (15 or more minutes).  Arriving late affects you and other patients whose appointments are after yours.  Also, if you miss three or more appointments without notifying the office, you may be dismissed from the clinic at the provider's discretion.      For prescription refill requests, have your pharmacy contact our office and allow 72 hours for refills to be completed.    Today you received the following chemotherapy and/or immunotherapy agents Oxaliplatin (ELOXATIN), Leucovorin & Flourouracil (ADRUCIL).      To help prevent nausea and vomiting after your treatment, we encourage you to take your nausea medication as directed.  BELOW ARE SYMPTOMS THAT SHOULD BE REPORTED IMMEDIATELY: *FEVER GREATER THAN 100.4 F (38 C) OR HIGHER *CHILLS OR SWEATING *NAUSEA AND VOMITING THAT IS NOT CONTROLLED WITH YOUR NAUSEA MEDICATION *UNUSUAL SHORTNESS OF BREATH *UNUSUAL BRUISING OR BLEEDING *URINARY PROBLEMS (pain or burning when urinating, or frequent urination) *BOWEL PROBLEMS (unusual diarrhea, constipation, pain near the anus) TENDERNESS IN MOUTH AND THROAT WITH OR WITHOUT PRESENCE OF ULCERS (sore throat, sores in mouth, or a toothache) UNUSUAL RASH, SWELLING OR PAIN  UNUSUAL VAGINAL DISCHARGE OR ITCHING   Items with * indicate a potential emergency and should be followed up as soon as possible or go to the Emergency Department if any problems should occur.  Please show the CHEMOTHERAPY ALERT  CARD or IMMUNOTHERAPY ALERT CARD at check-in to the Emergency Department and triage nurse.  Should you have questions after your visit or need to cancel or reschedule your appointment, please contact [facility]  Dept: [phone]  and follow the prompts.  Office hours are 8:00 a.m. to 4:30 p.m. Monday - Friday. Please note that voicemails left after 4:00 p.m. may not be returned until the following business day.  We are closed weekends and major holidays. You have access to a nurse at all times for urgent questions. Please call the main number to the clinic Dept: [phone] and follow the prompts.   For any non-urgent questions, you may also contact your provider using MyChart. We now offer e-Visits for anyone 34 and older to request care online for non-urgent symptoms. For details visit [email or web address].   Also download the MyChart app! Go to the app store, search "MyChart", open the app, select [facility], and log in with your MyChart username and password.  Due to Covid, a mask is required upon entering the hospital/clinic. If you do not have a mask, one will be given to you upon arrival. For doctor visits, patients may have 1 support person aged 67 or older with them. For treatment visits, patients cannot have anyone with them due to current Covid guidelines and our immunocompromised population.   Oxaliplatin Injection What is this medication? OXALIPLATIN (ox AL i PLA tin) is a chemotherapy drug. It targets fast dividing cells, like cancer cells, and causes these cells to die. This medicine is used to treat cancers of the colon and rectum, and  many other cancers. This medicine may be used for other purposes; ask your health care provider or pharmacist if you have questions. COMMON BRAND NAME(S): Eloxatin What should I tell my care team before I take this medication? They need to know if you have any of these conditions: heart disease history of irregular  heartbeat liver disease low blood counts, like white cells, platelets, or red blood cells lung or breathing disease, like asthma take medicines that treat or prevent blood clots tingling of the fingers or toes, or other nerve disorder an unusual or allergic reaction to oxaliplatin, other chemotherapy, other medicines, foods, dyes, or preservatives pregnant or trying to get pregnant breast-feeding How should I use this medication? This drug is given as an infusion into a vein. It is administered in a hospital or clinic by a specially trained health care professional. Talk to your pediatrician regarding the use of this medicine in children. Special care may be needed. Overdosage: If you think you have taken too much of this medicine contact a poison control center or emergency room at once. NOTE: This medicine is only for you. Do not share this medicine with others. What if I miss a dose? It is important not to miss a dose. Call your doctor or health care professional if you are unable to keep an appointment. What may interact with this medication? Do not take this medicine with any of the following medications: cisapride dronedarone pimozide thioridazine This medicine may also interact with the following medications: aspirin and aspirin-like medicines certain medicines that treat or prevent blood clots like warfarin, apixaban, dabigatran, and rivaroxaban cisplatin cyclosporine diuretics medicines for infection like acyclovir, adefovir, amphotericin B, bacitracin, cidofovir, foscarnet, ganciclovir, gentamicin, pentamidine, vancomycin NSAIDs, medicines for pain and inflammation, like ibuprofen or naproxen other medicines that prolong the QT interval (an abnormal heart rhythm) pamidronate zoledronic acid This list may not describe all possible interactions. Give your health care provider a list of all the medicines, herbs, non-prescription drugs, or dietary supplements you use. Also tell  them if you smoke, drink alcohol, or use illegal drugs. Some items may interact with your medicine. What should I watch for while using this medication? Your condition will be monitored carefully while you are receiving this medicine. You may need blood work done while you are taking this medicine. This medicine may make you feel generally unwell. This is not uncommon as chemotherapy can affect healthy cells as well as cancer cells. Report any side effects. Continue your course of treatment even though you feel ill unless your healthcare professional tells you to stop. This medicine can make you more sensitive to cold. Do not drink cold drinks or use ice. Cover exposed skin before coming in contact with cold temperatures or cold objects. When out in cold weather wear warm clothing and cover your mouth and nose to warm the air that goes into your lungs. Tell your doctor if you get sensitive to the cold. Do not become pregnant while taking this medicine or for 9 months after stopping it. Women should inform their health care professional if they wish to become pregnant or think they might be pregnant. Men should not father a child while taking this medicine and for 6 months after stopping it. There is potential for serious side effects to an unborn child. Talk to your health care professional for more information. Do not breast-feed a child while taking this medicine or for 3 months after stopping it. This medicine has caused ovarian failure in  some women. This medicine may make it more difficult to get pregnant. Talk to your health care professional if you are concerned about your fertility. This medicine has caused decreased sperm counts in some men. This may make it more difficult to father a child. Talk to your health care professional if you are concerned about your fertility. This medicine may increase your risk of getting an infection. Call your health care professional for advice if you get a fever,  chills, or sore throat, or other symptoms of a cold or flu. Do not treat yourself. Try to avoid being around people who are sick. Avoid taking medicines that contain aspirin, acetaminophen, ibuprofen, naproxen, or ketoprofen unless instructed by your health care professional. These medicines may hide a fever. Be careful brushing or flossing your teeth or using a toothpick because you may get an infection or bleed more easily. If you have any dental work done, tell your dentist you are receiving this medicine. What side effects may I notice from receiving this medication? Side effects that you should report to your doctor or health care professional as soon as possible: allergic reactions like skin rash, itching or hives, swelling of the face, lips, or tongue breathing problems cough low blood counts - this medicine may decrease the number of white blood cells, red blood cells, and platelets. You may be at increased risk for infections and bleeding nausea, vomiting pain, redness, or irritation at site where injected pain, tingling, numbness in the hands or feet signs and symptoms of bleeding such as bloody or black, tarry stools; red or dark brown urine; spitting up blood or brown material that looks like coffee grounds; red spots on the skin; unusual bruising or bleeding from the eyes, gums, or nose signs and symptoms of a dangerous change in heartbeat or heart rhythm like chest pain; dizziness; fast, irregular heartbeat; palpitations; feeling faint or lightheaded; falls signs and symptoms of infection like fever; chills; cough; sore throat; pain or trouble passing urine signs and symptoms of liver injury like dark yellow or brown urine; general ill feeling or flu-like symptoms; light-colored stools; loss of appetite; nausea; right upper belly pain; unusually weak or tired; yellowing of the eyes or skin signs and symptoms of low red blood cells or anemia such as unusually weak or tired; feeling faint  or lightheaded; falls signs and symptoms of muscle injury like dark urine; trouble passing urine or change in the amount of urine; unusually weak or tired; muscle pain; back pain Side effects that usually do not require medical attention (report to your doctor or health care professional if they continue or are bothersome): changes in taste diarrhea gas hair loss loss of appetite mouth sores This list may not describe all possible side effects. Call your doctor for medical advice about side effects. You may report side effects to FDA at 1-800-FDA-1088. Where should I keep my medication? This drug is given in a hospital or clinic and will not be stored at home. NOTE: This sheet is a summary. It may not cover all possible information. If you have questions about this medicine, talk to your doctor, pharmacist, or health care provider.  2022 Elsevier/Gold Standard (2018-07-12 12:20:35)  Leucovorin injection What is this medication? LEUCOVORIN (loo koe VOR in) is used to prevent or treat the harmful effects of some medicines. This medicine is used to treat anemia caused by a low amount of folic acid in the body. It is also used with 5-fluorouracil (5-FU) to treat colon  cancer. This medicine may be used for other purposes; ask your health care provider or pharmacist if you have questions. What should I tell my care team before I take this medication? They need to know if you have any of these conditions: anemia from low levels of vitamin B-12 in the blood an unusual or allergic reaction to leucovorin, folic acid, other medicines, foods, dyes, or preservatives pregnant or trying to get pregnant breast-feeding How should I use this medication? This medicine is for injection into a muscle or into a vein. It is given by a health care professional in a hospital or clinic setting. Talk to your pediatrician regarding the use of this medicine in children. Special care may be needed. Overdosage: If you  think you have taken too much of this medicine contact a poison control center or emergency room at once. NOTE: This medicine is only for you. Do not share this medicine with others. What if I miss a dose? This does not apply. What may interact with this medication? capecitabine fluorouracil phenobarbital phenytoin primidone trimethoprim-sulfamethoxazole This list may not describe all possible interactions. Give your health care provider a list of all the medicines, herbs, non-prescription drugs, or dietary supplements you use. Also tell them if you smoke, drink alcohol, or use illegal drugs. Some items may interact with your medicine. What should I watch for while using this medication? Your condition will be monitored carefully while you are receiving this medicine. This medicine may increase the side effects of 5-fluorouracil, 5-FU. Tell your doctor or health care professional if you have diarrhea or mouth sores that do not get better or that get worse. What side effects may I notice from receiving this medication? Side effects that you should report to your doctor or health care professional as soon as possible: allergic reactions like skin rash, itching or hives, swelling of the face, lips, or tongue breathing problems fever, infection mouth sores unusual bleeding or bruising unusually weak or tired Side effects that usually do not require medical attention (report to your doctor or health care professional if they continue or are bothersome): constipation or diarrhea loss of appetite nausea, vomiting This list may not describe all possible side effects. Call your doctor for medical advice about side effects. You may report side effects to FDA at 1-800-FDA-1088. Where should I keep my medication? This drug is given in a hospital or clinic and will not be stored at home. NOTE: This sheet is a summary. It may not cover all possible information. If you have questions about this  medicine, talk to your doctor, pharmacist, or health care provider.  2022 Elsevier/Gold Standard (2007-08-29 16:50:29)  Fluorouracil, 5-FU injection What is this medication? FLUOROURACIL, 5-FU (flure oh YOOR a sil) is a chemotherapy drug. It slows the growth of cancer cells. This medicine is used to treat many types of cancer like breast cancer, colon or rectal cancer, pancreatic cancer, and stomach cancer. This medicine may be used for other purposes; ask your health care provider or pharmacist if you have questions. COMMON BRAND NAME(S): Adrucil What should I tell my care team before I take this medication? They need to know if you have any of these conditions: blood disorders dihydropyrimidine dehydrogenase (DPD) deficiency infection (especially a virus infection such as chickenpox, cold sores, or herpes) kidney disease liver disease malnourished, poor nutrition recent or ongoing radiation therapy an unusual or allergic reaction to fluorouracil, other chemotherapy, other medicines, foods, dyes, or preservatives pregnant or trying to get pregnant  breast-feeding How should I use this medication? This drug is given as an infusion or injection into a vein. It is administered in a hospital or clinic by a specially trained health care professional. Talk to your pediatrician regarding the use of this medicine in children. Special care may be needed. Overdosage: If you think you have taken too much of this medicine contact a poison control center or emergency room at once. NOTE: This medicine is only for you. Do not share this medicine with others. What if I miss a dose? It is important not to miss your dose. Call your doctor or health care professional if you are unable to keep an appointment. What may interact with this medication? Do not take this medicine with any of the following medications: live virus vaccines This medicine may also interact with the following medications: medicines  that treat or prevent blood clots like warfarin, enoxaparin, and dalteparin This list may not describe all possible interactions. Give your health care provider a list of all the medicines, herbs, non-prescription drugs, or dietary supplements you use. Also tell them if you smoke, drink alcohol, or use illegal drugs. Some items may interact with your medicine. What should I watch for while using this medication? Visit your doctor for checks on your progress. This drug may make you feel generally unwell. This is not uncommon, as chemotherapy can affect healthy cells as well as cancer cells. Report any side effects. Continue your course of treatment even though you feel ill unless your doctor tells you to stop. In some cases, you may be given additional medicines to help with side effects. Follow all directions for their use. Call your doctor or health care professional for advice if you get a fever, chills or sore throat, or other symptoms of a cold or flu. Do not treat yourself. This drug decreases your body's ability to fight infections. Try to avoid being around people who are sick. This medicine may increase your risk to bruise or bleed. Call your doctor or health care professional if you notice any unusual bleeding. Be careful brushing and flossing your teeth or using a toothpick because you may get an infection or bleed more easily. If you have any dental work done, tell your dentist you are receiving this medicine. Avoid taking products that contain aspirin, acetaminophen, ibuprofen, naproxen, or ketoprofen unless instructed by your doctor. These medicines may hide a fever. Do not become pregnant while taking this medicine. Women should inform their doctor if they wish to become pregnant or think they might be pregnant. There is a potential for serious side effects to an unborn child. Talk to your health care professional or pharmacist for more information. Do not breast-feed an infant while taking  this medicine. Men should inform their doctor if they wish to father a child. This medicine may lower sperm counts. Do not treat diarrhea with over the counter products. Contact your doctor if you have diarrhea that lasts more than 2 days or if it is severe and watery. This medicine can make you more sensitive to the sun. Keep out of the sun. If you cannot avoid being in the sun, wear protective clothing and use sunscreen. Do not use sun lamps or tanning beds/booths. What side effects may I notice from receiving this medication? Side effects that you should report to your doctor or health care professional as soon as possible: allergic reactions like skin rash, itching or hives, swelling of the face, lips, or tongue low blood  counts - this medicine may decrease the number of white blood cells, red blood cells and platelets. You may be at increased risk for infections and bleeding. signs of infection - fever or chills, cough, sore throat, pain or difficulty passing urine signs of decreased platelets or bleeding - bruising, pinpoint red spots on the skin, black, tarry stools, blood in the urine signs of decreased red blood cells - unusually weak or tired, fainting spells, lightheadedness breathing problems changes in vision chest pain mouth sores nausea and vomiting pain, swelling, redness at site where injected pain, tingling, numbness in the hands or feet redness, swelling, or sores on hands or feet stomach pain unusual bleeding Side effects that usually do not require medical attention (report to your doctor or health care professional if they continue or are bothersome): changes in finger or toe nails diarrhea dry or itchy skin hair loss headache loss of appetite sensitivity of eyes to the light stomach upset unusually teary eyes This list may not describe all possible side effects. Call your doctor for medical advice about side effects. You may report side effects to FDA at  1-800-FDA-1088. Where should I keep my medication? This drug is given in a hospital or clinic and will not be stored at home. NOTE: This sheet is a summary. It may not cover all possible information. If you have questions about this medicine, talk to your doctor, pharmacist, or health care provider.  2022 Elsevier/Gold Standard (2019-01-23 15:00:03)  The chemotherapy medication bag should finish at 46 hours, 96 hours, or 7 days. For example, if your pump is scheduled for 46 hours and it was put on at 4:00 p.m., it should finish at 2:00 p.m. the day it is scheduled to come off regardless of your appointment time.     Estimated time to finish at 11:00 a.m. on Friday 11/14/2020.   If the display on your pump reads "Low Volume" and it is beeping, take the batteries out of the pump and come to the cancer center for it to be taken off.   If the pump alarms go off prior to the pump reading "Low Volume" then call [phone] and someone can assist you.  If the plunger comes out and the chemotherapy medication is leaking out, please use your home chemo spill kit to clean up the spill. Do NOT use paper towels or other household products.  If you have problems or questions regarding your pump, please call either [phone] (24 hours a day) or the cancer center Monday-Friday 8:00 a.m.- 4:30 p.m. at the clinic number and we will assist you. If you are unable to get assistance, then go to the nearest Emergency Department and ask the staff to contact the IV team for assistance.

## 2020-11-12 NOTE — Progress Notes (Signed)
  [facility] OFFICE PROGRESS NOTE   Diagnosis: Rectal cancer  INTERVAL HISTORY:   [name] returns as scheduled.  He completed cycle 2 FOLFOX 10/22/2020.  Cycle 3 was held last week due to neutropenia/thrombocytopenia.  He overall feels well.  He has intermittent nausea.  No mouth sores.  Bowel habits continue to be alternating constipation/diarrhea.  Rectal pain is somewhat better.  No numbness or tingling in the hands or feet.  Objective:  Vital signs in last 24 hours:  Blood pressure 101/73, pulse 78, temperature 98.7 F (37.1 C), temperature source Oral, resp. rate 20, height 6' (1.829 m), weight 234 lb 9.6 oz (106.4 kg), SpO2 96 %.    HEENT: No thrush or ulcers. Resp: Distant breath sounds. Cardio: Distant heart sounds, regular. GI: Abdomen soft and nontender.  No hepatomegaly. Vascular: No leg edema. Skin: Palms without erythema. Port-A-Cath without erythema.   Lab Results:  Lab Results  Component Value Date   WBC 6.6 11/12/2020   HGB 13.5 11/12/2020   HCT 42.9 11/12/2020   MCV 88.5 11/12/2020   PLT 164 11/12/2020   NEUTROABS 1.9 11/12/2020    Imaging:  No results found.  Medications: I have reviewed the patient's current medications.  Assessment/Plan: Rectal cancer Mass at 6 cm on digital exam/anoscopy 08/25/2020 Colonoscopy 09/05/2020-mid rectal mass-biopsy, adenocarcinoma, no loss of mismatch repair protein expression CT abdomen/pelvis 08/29/2020-small amount of free fluid in the pelvis, mildly enlarged left external iliac node, cystic changes of the left kidney with hydronephrosis and regions of cortical thinning with transition at the left UPJ suggesting chronic left UPJ obstruction MRI pelvis 09/24/2020-tumor 6 cm from the anal verge, 1.8 cm from the internal anal sphincter, T2, 7 mm lymph node in the left mesorectal sheath, N1 Cycle 1 FOLFOX 10/08/2020 Cycle 2 FOLFOX 10/22/2020 Cycle 3 FOLFOX 11/12/2020 (oxaliplatin dose reduced, white cell  growth factor support on day of pump discontinuation)     Diabetes Chronic kidney disease Bipolar disorder Hypertension Hyperlipidemia Restless leg syndrome Multiple colon polyps removed on the colonoscopy 09/05/2020-tubular adenomas including a rectal polyp with high-grade dysplasia-polypectomy margin negative for dysplasia Portacath placement, Dr Zenia Resides, 10/02/2020  Disposition: William Good appears stable.  He has completed 2 cycles of FOLFOX.  Cycle 3 was held last week due to neutropenia/thrombocytopenia.  Review of the CBC from today shows improved blood counts, adequate to proceed with treatment.  He will receive white cell growth factor support on day of pump discontinuation.  We reviewed the chemistry panel, adequate to proceed.  He will return for lab, follow-up, cycle 4 FOLFOX in 2 weeks.  He will contact the office in the interim with any problems.    Ned Card ANP/GNP-BC   11/12/2020  9:01 AM

## 2020-11-12 NOTE — Progress Notes (Signed)
Patient presents for treatment. RN assessment completed along with the following:  Labs reviewed - WNL, reviewed alongside Lattie Haw, NP. Vitals reviewed including weight - WNL Oncology Treatment Attestation completed - YES Consent completed and reflects current therapy/intent - YES MD/APP progress note reviewed - YES Treatment plan/orders reviewed - YES, Verbal hand off from [place], NP  Last treatment date reviewed and appropriate      amount of time has elapsed between treatments.   Patient to proceed with treatment.

## 2020-11-14 ENCOUNTER — Inpatient Hospital Stay: Payer: Medicare Other

## 2020-11-14 ENCOUNTER — Other Ambulatory Visit: Payer: Self-pay

## 2020-11-14 VITALS — BP 105/79 | HR 70 | Temp 98.0°F | Resp 18

## 2020-11-14 DIAGNOSIS — Z5111 Encounter for antineoplastic chemotherapy: Secondary | ICD-10-CM | POA: Diagnosis not present

## 2020-11-14 DIAGNOSIS — I10 Essential (primary) hypertension: Secondary | ICD-10-CM | POA: Diagnosis not present

## 2020-11-14 DIAGNOSIS — Z5189 Encounter for other specified aftercare: Secondary | ICD-10-CM | POA: Diagnosis not present

## 2020-11-14 DIAGNOSIS — C20 Malignant neoplasm of rectum: Secondary | ICD-10-CM | POA: Diagnosis not present

## 2020-11-14 DIAGNOSIS — T451X5A Adverse effect of antineoplastic and immunosuppressive drugs, initial encounter: Secondary | ICD-10-CM | POA: Diagnosis not present

## 2020-11-14 DIAGNOSIS — D701 Agranulocytosis secondary to cancer chemotherapy: Secondary | ICD-10-CM | POA: Diagnosis not present

## 2020-11-14 DIAGNOSIS — Z79899 Other long term (current) drug therapy: Secondary | ICD-10-CM | POA: Diagnosis not present

## 2020-11-14 DIAGNOSIS — E119 Type 2 diabetes mellitus without complications: Secondary | ICD-10-CM | POA: Diagnosis not present

## 2020-11-14 MED ORDER — SODIUM CHLORIDE 0.9% FLUSH
10.0000 mL | INTRAVENOUS | Status: DC | PRN
  Administered 2020-11-14: 10 mL

## 2020-11-14 MED ORDER — HEPARIN SOD (PORK) LOCK FLUSH 100 UNIT/ML IV SOLN
500.0000 [IU] | Freq: Once | INTRAVENOUS | Status: AC | PRN
  Administered 2020-11-14: 500 [IU]

## 2020-11-14 MED ORDER — PEGFILGRASTIM-BMEZ 6 MG/0.6ML ~~LOC~~ SOSY
6.0000 mg | PREFILLED_SYRINGE | Freq: Once | SUBCUTANEOUS | Status: AC
  Administered 2020-11-14: 6 mg via SUBCUTANEOUS

## 2020-11-14 NOTE — Patient Instructions (Signed)

## 2020-11-19 ENCOUNTER — Inpatient Hospital Stay: Payer: Medicare Other | Admitting: Nurse Practitioner

## 2020-11-19 ENCOUNTER — Inpatient Hospital Stay: Payer: Medicare Other

## 2020-11-21 ENCOUNTER — Inpatient Hospital Stay: Payer: Medicare Other

## 2020-11-23 ENCOUNTER — Other Ambulatory Visit: Payer: Self-pay | Admitting: Oncology

## 2020-11-26 ENCOUNTER — Inpatient Hospital Stay: Payer: Medicare Other

## 2020-11-26 ENCOUNTER — Other Ambulatory Visit: Payer: Self-pay

## 2020-11-26 ENCOUNTER — Telehealth: Payer: Self-pay

## 2020-11-26 ENCOUNTER — Inpatient Hospital Stay: Payer: Medicare Other | Admitting: Oncology

## 2020-11-26 ENCOUNTER — Inpatient Hospital Stay: Payer: Medicare Other | Admitting: Nutrition

## 2020-11-26 VITALS — BP 102/71 | HR 75 | Temp 98.7°F | Resp 20 | Ht 72.0 in | Wt 233.6 lb

## 2020-11-26 DIAGNOSIS — Z5111 Encounter for antineoplastic chemotherapy: Secondary | ICD-10-CM | POA: Diagnosis not present

## 2020-11-26 DIAGNOSIS — I10 Essential (primary) hypertension: Secondary | ICD-10-CM | POA: Diagnosis not present

## 2020-11-26 DIAGNOSIS — E119 Type 2 diabetes mellitus without complications: Secondary | ICD-10-CM | POA: Diagnosis not present

## 2020-11-26 DIAGNOSIS — C20 Malignant neoplasm of rectum: Secondary | ICD-10-CM

## 2020-11-26 DIAGNOSIS — D701 Agranulocytosis secondary to cancer chemotherapy: Secondary | ICD-10-CM | POA: Diagnosis not present

## 2020-11-26 DIAGNOSIS — T451X5A Adverse effect of antineoplastic and immunosuppressive drugs, initial encounter: Secondary | ICD-10-CM | POA: Diagnosis not present

## 2020-11-26 DIAGNOSIS — Z79899 Other long term (current) drug therapy: Secondary | ICD-10-CM | POA: Diagnosis not present

## 2020-11-26 DIAGNOSIS — Z5189 Encounter for other specified aftercare: Secondary | ICD-10-CM | POA: Diagnosis not present

## 2020-11-26 LAB — CBC WITH DIFFERENTIAL (CANCER CENTER ONLY)
Abs Immature Granulocytes: 0.62 10*3/uL — ABNORMAL HIGH (ref 0.00–0.07)
Basophils Absolute: 0.1 10*3/uL (ref 0.0–0.1)
Basophils Relative: 1 %
Eosinophils Absolute: 0 10*3/uL (ref 0.0–0.5)
Eosinophils Relative: 1 %
HCT: 41.1 % (ref 39.0–52.0)
Hemoglobin: 12.9 g/dL — ABNORMAL LOW (ref 13.0–17.0)
Immature Granulocytes: 8 %
Lymphocytes Relative: 38 %
Lymphs Abs: 3.1 10*3/uL (ref 0.7–4.0)
MCH: 27.8 pg (ref 26.0–34.0)
MCHC: 31.4 g/dL (ref 30.0–36.0)
MCV: 88.6 fL (ref 80.0–100.0)
Monocytes Absolute: 0.7 10*3/uL (ref 0.1–1.0)
Monocytes Relative: 9 %
Neutro Abs: 3.6 10*3/uL (ref 1.7–7.7)
Neutrophils Relative %: 43 %
Platelet Count: 85 10*3/uL — ABNORMAL LOW (ref 150–400)
RBC: 4.64 MIL/uL (ref 4.22–5.81)
RDW: 15.5 % (ref 11.5–15.5)
WBC Count: 8.1 10*3/uL (ref 4.0–10.5)
nRBC: 0.6 % — ABNORMAL HIGH (ref 0.0–0.2)

## 2020-11-26 LAB — CMP (CANCER CENTER ONLY)
ALT: 20 U/L (ref 0–44)
AST: 25 U/L (ref 15–41)
Albumin: 3.7 g/dL (ref 3.5–5.0)
Alkaline Phosphatase: 123 U/L (ref 38–126)
Anion gap: 8 (ref 5–15)
BUN: 11 mg/dL (ref 8–23)
CO2: 32 mmol/L (ref 22–32)
Calcium: 9.2 mg/dL (ref 8.9–10.3)
Chloride: 102 mmol/L (ref 98–111)
Creatinine: 1.34 mg/dL — ABNORMAL HIGH (ref 0.61–1.24)
GFR, Estimated: 59 mL/min — ABNORMAL LOW (ref >60)
Glucose, Bld: 154 mg/dL — ABNORMAL HIGH (ref 70–99)
Potassium: 3.4 mmol/L — ABNORMAL LOW (ref 3.5–5.1)
Sodium: 142 mmol/L (ref 135–145)
Total Bilirubin: 0.3 mg/dL (ref 0.3–1.2)
Total Protein: 6.2 g/dL — ABNORMAL LOW (ref 6.5–8.1)

## 2020-11-26 MED ORDER — OXALIPLATIN CHEMO INJECTION 100 MG/20ML
65.0000 mg/m2 | Freq: Once | INTRAVENOUS | Status: AC
  Administered 2020-11-26: 150 mg via INTRAVENOUS
  Filled 2020-11-26: qty 10

## 2020-11-26 MED ORDER — SODIUM CHLORIDE 0.9 % IV SOLN
10.0000 mg | Freq: Once | INTRAVENOUS | Status: AC
  Administered 2020-11-26: 10 mg via INTRAVENOUS
  Filled 2020-11-26: qty 1

## 2020-11-26 MED ORDER — FLUOROURACIL CHEMO INJECTION 2.5 GM/50ML
300.0000 mg/m2 | Freq: Once | INTRAVENOUS | Status: AC
  Administered 2020-11-26: 700 mg via INTRAVENOUS
  Filled 2020-11-26: qty 14

## 2020-11-26 MED ORDER — SODIUM CHLORIDE 0.9 % IV SOLN
5000.0000 mg | INTRAVENOUS | Status: DC
  Administered 2020-11-26: 5000 mg via INTRAVENOUS
  Filled 2020-11-26: qty 100

## 2020-11-26 MED ORDER — PALONOSETRON HCL INJECTION 0.25 MG/5ML
0.2500 mg | Freq: Once | INTRAVENOUS | Status: AC
  Administered 2020-11-26: 0.25 mg via INTRAVENOUS
  Filled 2020-11-26: qty 5

## 2020-11-26 MED ORDER — LEUCOVORIN CALCIUM INJECTION 350 MG
300.0000 mg/m2 | Freq: Once | INTRAVENOUS | Status: AC
  Administered 2020-11-26: 696 mg via INTRAVENOUS
  Filled 2020-11-26: qty 34.8

## 2020-11-26 MED ORDER — DEXTROSE 5 % IV SOLN: Freq: Once | INTRAVENOUS | Status: AC

## 2020-11-26 NOTE — Progress Notes (Signed)
Patient presents for treatment. RN assessment completed along with the following:  Treatment Conditions (labs/vitals/weight) reviewed - Yes, and ok to infuse with plts 85    Oncology Treatment Attestation completed for current therapy- Yes, on date 09/29/20 Informed consent completed and reflects current therapy/intent - Yes, on date 10/08/20             Provider progress note reviewed - Yes, today's provider note was reviewed. Treatment/Antibody/Supportive plan reviewed -Yes, and there are no adjustments needed for today's treatment."} S&H and other orders reviewed - Yes, and there are no additional orders identified. Previous treatment date reviewed - Yes, and the appropriate amount of time has elapsed between treatments. Clinic Hand Off Received from - Carolynn Sayers LPN  Patient to proceed with treatment.

## 2020-11-26 NOTE — Patient Instructions (Signed)
[facility]  Discharge Instructions: Thank you for choosing [facility] to provide your oncology and hematology care.   If you have a lab appointment with the [facility], please go directly to the [facility] and check in at the registration area.   Wear comfortable clothing and clothing appropriate for easy access to any Portacath or PICC line.   We strive to give you quality time with your provider. You may need to reschedule your appointment if you arrive late (15 or more minutes).  Arriving late affects you and other patients whose appointments are after yours.  Also, if you miss three or more appointments without notifying the office, you may be dismissed from the clinic at the provider's discretion.      For prescription refill requests, have your pharmacy contact our office and allow 72 hours for refills to be completed.    Today you received the following chemotherapy and/or immunotherapy agents OXALAPLATIN, LEUCOVORIN, 5 FU      To help prevent nausea and vomiting after your treatment, we encourage you to take your nausea medication as directed.  BELOW ARE SYMPTOMS THAT SHOULD BE REPORTED IMMEDIATELY: *FEVER GREATER THAN 100.4 F (38 C) OR HIGHER *CHILLS OR SWEATING *NAUSEA AND VOMITING THAT IS NOT CONTROLLED WITH YOUR NAUSEA MEDICATION *UNUSUAL SHORTNESS OF BREATH *UNUSUAL BRUISING OR BLEEDING *URINARY PROBLEMS (pain or burning when urinating, or frequent urination) *BOWEL PROBLEMS (unusual diarrhea, constipation, pain near the anus) TENDERNESS IN MOUTH AND THROAT WITH OR WITHOUT PRESENCE OF ULCERS (sore throat, sores in mouth, or a toothache) UNUSUAL RASH, SWELLING OR PAIN  UNUSUAL VAGINAL DISCHARGE OR ITCHING   Items with * indicate a potential emergency and should be followed up as soon as possible or go to the Emergency Department if any problems should occur.  Please show the CHEMOTHERAPY ALERT CARD or IMMUNOTHERAPY ALERT CARD  at check-in to the Emergency Department and triage nurse.  Should you have questions after your visit or need to cancel or reschedule your appointment, please contact [facility]  Dept: [phone]  and follow the prompts.  Office hours are 8:00 a.m. to 4:30 p.m. Monday - Friday. Please note that voicemails left after 4:00 p.m. may not be returned until the following business day.  We are closed weekends and major holidays. You have access to a nurse at all times for urgent questions. Please call the main number to the clinic Dept: [phone] and follow the prompts.   For any non-urgent questions, you may also contact your provider using MyChart. We now offer e-Visits for anyone 52 and older to request care online for non-urgent symptoms. For details visit [email or web address].   Also download the MyChart app! Go to the app store, search "MyChart", open the app, select [facility], and log in with your MyChart username and password.  Due to Covid, a mask is required upon entering the hospital/clinic. If you do not have a mask, one will be given to you upon arrival. For doctor visits, patients may have 1 support person aged 75 or older with them. For treatment visits, patients cannot have anyone with them due to current Covid guidelines and our immunocompromised population.   Oxaliplatin Injection What is this medication? OXALIPLATIN (ox AL i PLA tin) is a chemotherapy drug. It targets fast dividing cells, like cancer cells, and causes these cells to die. This medicine is used to treat cancers of the colon and rectum, and many other cancers.  This medicine may be used for other purposes; ask your health care provider or pharmacist if you have questions. COMMON BRAND NAME(S): Eloxatin What should I tell my care team before I take this medication? They need to know if you have any of these conditions: heart disease history of irregular heartbeat liver disease low  blood counts, like white cells, platelets, or red blood cells lung or breathing disease, like asthma take medicines that treat or prevent blood clots tingling of the fingers or toes, or other nerve disorder an unusual or allergic reaction to oxaliplatin, other chemotherapy, other medicines, foods, dyes, or preservatives pregnant or trying to get pregnant breast-feeding How should I use this medication? This drug is given as an infusion into a vein. It is administered in a hospital or clinic by a specially trained health care professional. Talk to your pediatrician regarding the use of this medicine in children. Special care may be needed. Overdosage: If you think you have taken too much of this medicine contact a poison control center or emergency room at once. NOTE: This medicine is only for you. Do not share this medicine with others. What if I miss a dose? It is important not to miss a dose. Call your doctor or health care professional if you are unable to keep an appointment. What may interact with this medication? Do not take this medicine with any of the following medications: cisapride dronedarone pimozide thioridazine This medicine may also interact with the following medications: aspirin and aspirin-like medicines certain medicines that treat or prevent blood clots like warfarin, apixaban, dabigatran, and rivaroxaban cisplatin cyclosporine diuretics medicines for infection like acyclovir, adefovir, amphotericin B, bacitracin, cidofovir, foscarnet, ganciclovir, gentamicin, pentamidine, vancomycin NSAIDs, medicines for pain and inflammation, like ibuprofen or naproxen other medicines that prolong the QT interval (an abnormal heart rhythm) pamidronate zoledronic acid This list may not describe all possible interactions. Give your health care provider a list of all the medicines, herbs, non-prescription drugs, or dietary supplements you use. Also tell them if you smoke, drink  alcohol, or use illegal drugs. Some items may interact with your medicine. What should I watch for while using this medication? Your condition will be monitored carefully while you are receiving this medicine. You may need blood work done while you are taking this medicine. This medicine may make you feel generally unwell. This is not uncommon as chemotherapy can affect healthy cells as well as cancer cells. Report any side effects. Continue your course of treatment even though you feel ill unless your healthcare professional tells you to stop. This medicine can make you more sensitive to cold. Do not drink cold drinks or use ice. Cover exposed skin before coming in contact with cold temperatures or cold objects. When out in cold weather wear warm clothing and cover your mouth and nose to warm the air that goes into your lungs. Tell your doctor if you get sensitive to the cold. Do not become pregnant while taking this medicine or for 9 months after stopping it. Women should inform their health care professional if they wish to become pregnant or think they might be pregnant. Men should not father a child while taking this medicine and for 6 months after stopping it. There is potential for serious side effects to an unborn child. Talk to your health care professional for more information. Do not breast-feed a child while taking this medicine or for 3 months after stopping it. This medicine has caused ovarian failure in some women. This  medicine may make it more difficult to get pregnant. Talk to your health care professional if you are concerned about your fertility. This medicine has caused decreased sperm counts in some men. This may make it more difficult to father a child. Talk to your health care professional if you are concerned about your fertility. This medicine may increase your risk of getting an infection. Call your health care professional for advice if you get a fever, chills, or sore throat,  or other symptoms of a cold or flu. Do not treat yourself. Try to avoid being around people who are sick. Avoid taking medicines that contain aspirin, acetaminophen, ibuprofen, naproxen, or ketoprofen unless instructed by your health care professional. These medicines may hide a fever. Be careful brushing or flossing your teeth or using a toothpick because you may get an infection or bleed more easily. If you have any dental work done, tell your dentist you are receiving this medicine. What side effects may I notice from receiving this medication? Side effects that you should report to your doctor or health care professional as soon as possible: allergic reactions like skin rash, itching or hives, swelling of the face, lips, or tongue breathing problems cough low blood counts - this medicine may decrease the number of white blood cells, red blood cells, and platelets. You may be at increased risk for infections and bleeding nausea, vomiting pain, redness, or irritation at site where injected pain, tingling, numbness in the hands or feet signs and symptoms of bleeding such as bloody or black, tarry stools; red or dark brown urine; spitting up blood or brown material that looks like coffee grounds; red spots on the skin; unusual bruising or bleeding from the eyes, gums, or nose signs and symptoms of a dangerous change in heartbeat or heart rhythm like chest pain; dizziness; fast, irregular heartbeat; palpitations; feeling faint or lightheaded; falls signs and symptoms of infection like fever; chills; cough; sore throat; pain or trouble passing urine signs and symptoms of liver injury like dark yellow or brown urine; general ill feeling or flu-like symptoms; light-colored stools; loss of appetite; nausea; right upper belly pain; unusually weak or tired; yellowing of the eyes or skin signs and symptoms of low red blood cells or anemia such as unusually weak or tired; feeling faint or lightheaded;  falls signs and symptoms of muscle injury like dark urine; trouble passing urine or change in the amount of urine; unusually weak or tired; muscle pain; back pain Side effects that usually do not require medical attention (report to your doctor or health care professional if they continue or are bothersome): changes in taste diarrhea gas hair loss loss of appetite mouth sores This list may not describe all possible side effects. Call your doctor for medical advice about side effects. You may report side effects to FDA at 1-800-FDA-1088. Where should I keep my medication? This drug is given in a hospital or clinic and will not be stored at home. NOTE: This sheet is a summary. It may not cover all possible information. If you have questions about this medicine, talk to your doctor, pharmacist, or health care provider.  2022 Elsevier/Gold Standard (2018-07-12 12:20:35)  Leucovorin injection What is this medication? LEUCOVORIN (loo koe VOR in) is used to prevent or treat the harmful effects of some medicines. This medicine is used to treat anemia caused by a low amount of folic acid in the body. It is also used with 5-fluorouracil (5-FU) to treat colon cancer. This medicine  may be used for other purposes; ask your health care provider or pharmacist if you have questions. What should I tell my care team before I take this medication? They need to know if you have any of these conditions: anemia from low levels of vitamin B-12 in the blood an unusual or allergic reaction to leucovorin, folic acid, other medicines, foods, dyes, or preservatives pregnant or trying to get pregnant breast-feeding How should I use this medication? This medicine is for injection into a muscle or into a vein. It is given by a health care professional in a hospital or clinic setting. Talk to your pediatrician regarding the use of this medicine in children. Special care may be needed. Overdosage: If you think you have  taken too much of this medicine contact a poison control center or emergency room at once. NOTE: This medicine is only for you. Do not share this medicine with others. What if I miss a dose? This does not apply. What may interact with this medication? capecitabine fluorouracil phenobarbital phenytoin primidone trimethoprim-sulfamethoxazole This list may not describe all possible interactions. Give your health care provider a list of all the medicines, herbs, non-prescription drugs, or dietary supplements you use. Also tell them if you smoke, drink alcohol, or use illegal drugs. Some items may interact with your medicine. What should I watch for while using this medication? Your condition will be monitored carefully while you are receiving this medicine. This medicine may increase the side effects of 5-fluorouracil, 5-FU. Tell your doctor or health care professional if you have diarrhea or mouth sores that do not get better or that get worse. What side effects may I notice from receiving this medication? Side effects that you should report to your doctor or health care professional as soon as possible: allergic reactions like skin rash, itching or hives, swelling of the face, lips, or tongue breathing problems fever, infection mouth sores unusual bleeding or bruising unusually weak or tired Side effects that usually do not require medical attention (report to your doctor or health care professional if they continue or are bothersome): constipation or diarrhea loss of appetite nausea, vomiting This list may not describe all possible side effects. Call your doctor for medical advice about side effects. You may report side effects to FDA at 1-800-FDA-1088. Where should I keep my medication? This drug is given in a hospital or clinic and will not be stored at home. NOTE: This sheet is a summary. It may not cover all possible information. If you have questions about this medicine, talk to  your doctor, pharmacist, or health care provider.  2022 Elsevier/Gold Standard (2007-08-29 16:50:29)  Fluorouracil, 5-FU injection What is this medication? FLUOROURACIL, 5-FU (flure oh YOOR a sil) is a chemotherapy drug. It slows the growth of cancer cells. This medicine is used to treat many types of cancer like breast cancer, colon or rectal cancer, pancreatic cancer, and stomach cancer. This medicine may be used for other purposes; ask your health care provider or pharmacist if you have questions. COMMON BRAND NAME(S): Adrucil What should I tell my care team before I take this medication? They need to know if you have any of these conditions: blood disorders dihydropyrimidine dehydrogenase (DPD) deficiency infection (especially a virus infection such as chickenpox, cold sores, or herpes) kidney disease liver disease malnourished, poor nutrition recent or ongoing radiation therapy an unusual or allergic reaction to fluorouracil, other chemotherapy, other medicines, foods, dyes, or preservatives pregnant or trying to get pregnant breast-feeding How should  I use this medication? This drug is given as an infusion or injection into a vein. It is administered in a hospital or clinic by a specially trained health care professional. Talk to your pediatrician regarding the use of this medicine in children. Special care may be needed. Overdosage: If you think you have taken too much of this medicine contact a poison control center or emergency room at once. NOTE: This medicine is only for you. Do not share this medicine with others. What if I miss a dose? It is important not to miss your dose. Call your doctor or health care professional if you are unable to keep an appointment. What may interact with this medication? Do not take this medicine with any of the following medications: live virus vaccines This medicine may also interact with the following medications: medicines that treat or  prevent blood clots like warfarin, enoxaparin, and dalteparin This list may not describe all possible interactions. Give your health care provider a list of all the medicines, herbs, non-prescription drugs, or dietary supplements you use. Also tell them if you smoke, drink alcohol, or use illegal drugs. Some items may interact with your medicine. What should I watch for while using this medication? Visit your doctor for checks on your progress. This drug may make you feel generally unwell. This is not uncommon, as chemotherapy can affect healthy cells as well as cancer cells. Report any side effects. Continue your course of treatment even though you feel ill unless your doctor tells you to stop. In some cases, you may be given additional medicines to help with side effects. Follow all directions for their use. Call your doctor or health care professional for advice if you get a fever, chills or sore throat, or other symptoms of a cold or flu. Do not treat yourself. This drug decreases your body's ability to fight infections. Try to avoid being around people who are sick. This medicine may increase your risk to bruise or bleed. Call your doctor or health care professional if you notice any unusual bleeding. Be careful brushing and flossing your teeth or using a toothpick because you may get an infection or bleed more easily. If you have any dental work done, tell your dentist you are receiving this medicine. Avoid taking products that contain aspirin, acetaminophen, ibuprofen, naproxen, or ketoprofen unless instructed by your doctor. These medicines may hide a fever. Do not become pregnant while taking this medicine. Women should inform their doctor if they wish to become pregnant or think they might be pregnant. There is a potential for serious side effects to an unborn child. Talk to your health care professional or pharmacist for more information. Do not breast-feed an infant while taking this  medicine. Men should inform their doctor if they wish to father a child. This medicine may lower sperm counts. Do not treat diarrhea with over the counter products. Contact your doctor if you have diarrhea that lasts more than 2 days or if it is severe and watery. This medicine can make you more sensitive to the sun. Keep out of the sun. If you cannot avoid being in the sun, wear protective clothing and use sunscreen. Do not use sun lamps or tanning beds/booths. What side effects may I notice from receiving this medication? Side effects that you should report to your doctor or health care professional as soon as possible: allergic reactions like skin rash, itching or hives, swelling of the face, lips, or tongue low blood counts - this  medicine may decrease the number of white blood cells, red blood cells and platelets. You may be at increased risk for infections and bleeding. signs of infection - fever or chills, cough, sore throat, pain or difficulty passing urine signs of decreased platelets or bleeding - bruising, pinpoint red spots on the skin, black, tarry stools, blood in the urine signs of decreased red blood cells - unusually weak or tired, fainting spells, lightheadedness breathing problems changes in vision chest pain mouth sores nausea and vomiting pain, swelling, redness at site where injected pain, tingling, numbness in the hands or feet redness, swelling, or sores on hands or feet stomach pain unusual bleeding Side effects that usually do not require medical attention (report to your doctor or health care professional if they continue or are bothersome): changes in finger or toe nails diarrhea dry or itchy skin hair loss headache loss of appetite sensitivity of eyes to the light stomach upset unusually teary eyes This list may not describe all possible side effects. Call your doctor for medical advice about side effects. You may report side effects to FDA at  1-800-FDA-1088. Where should I keep my medication? This drug is given in a hospital or clinic and will not be stored at home. NOTE: This sheet is a summary. It may not cover all possible information. If you have questions about this medicine, talk to your doctor, pharmacist, or health care provider.  2022 Elsevier/Gold Standard (2019-01-23 15:00:03)

## 2020-11-26 NOTE — Telephone Encounter (Signed)
Error

## 2020-11-26 NOTE — Progress Notes (Signed)
  [place] OFFICE PROGRESS NOTE   Diagnosis: Rectal cancer  INTERVAL HISTORY:   William Good completed another cycle of FOLFOX on 11/12/2020.  He received Ziextenzo on 11/14/2020.  He reports increased diarrhea beginning a few days following chemotherapy.  Imodium helped.  Diarrhea has resolved.  No mouth sores, nausea, or neuropathy symptoms.  Objective:  Vital signs in last 24 hours:  Blood pressure 102/71, pulse 75, temperature 98.7 F (37.1 C), temperature source Oral, resp. rate 20, height 6' (1.829 m), weight 233 lb 9.6 oz (106 kg), SpO2 98 %.    HEENT: No thrush or ulcers Resp: Lungs clear bilaterally Cardio: Regular rate and rhythm, distant heart sounds GI: No hepatomegaly Vascular: No leg edema  Portacath/PICC-without erythema  Lab Results:  Lab Results  Component Value Date   WBC 8.1 11/26/2020   HGB 12.9 (L) 11/26/2020   HCT 41.1 11/26/2020   MCV 88.6 11/26/2020   PLT 85 (L) 11/26/2020   NEUTROABS PENDING 11/26/2020    CMP  Lab Results  Component Value Date   NA 142 11/26/2020   K 3.4 (L) 11/26/2020   CL 102 11/26/2020   CO2 32 11/26/2020   GLUCOSE 154 (H) 11/26/2020   BUN 11 11/26/2020   CREATININE 1.34 (H) 11/26/2020   CALCIUM 9.2 11/26/2020   PROT 6.2 (L) 11/26/2020   ALBUMIN 3.7 11/26/2020   AST 25 11/26/2020   ALT 20 11/26/2020   ALKPHOS 123 11/26/2020   BILITOT 0.3 11/26/2020   GFRNONAA 59 (L) 11/26/2020   GFRAA >60 01/17/2017    Lab Results  Component Value Date   CEA 48.66 (H) 10/08/2020     Medications: I have reviewed the patient's current medications.   Assessment/Plan: Rectal cancer Mass at 6 cm on digital exam/anoscopy 08/25/2020 Colonoscopy 09/05/2020-mid rectal mass-biopsy, adenocarcinoma, no loss of mismatch repair protein expression CT abdomen/pelvis 08/29/2020-small amount of free fluid in the pelvis, mildly enlarged left external iliac node, cystic changes of the left kidney with hydronephrosis and regions  of cortical thinning with transition at the left UPJ suggesting chronic left UPJ obstruction MRI pelvis 09/24/2020-tumor 6 cm from the anal verge, 1.8 cm from the internal anal sphincter, T2, 7 mm lymph node in the left mesorectal sheath, N1 Cycle 1 FOLFOX 10/08/2020 Cycle 2 FOLFOX 10/22/2020 Cycle 3 FOLFOX 11/12/2020 (oxaliplatin dose reduced, white cell growth factor support on day of pump discontinuation) Cycle 4 FOLFOX 11/26/2020, 5-FU bolus and infusion dose reduced secondary to diarrhea, Ziextenzo     Diabetes Chronic kidney disease Bipolar disorder Hypertension Hyperlipidemia Restless leg syndrome Multiple colon polyps removed on the colonoscopy 09/05/2020-tubular adenomas including a rectal polyp with high-grade dysplasia-polypectomy margin negative for dysplasia Portacath placement, Dr Zenia Resides, 10/02/2020    Disposition: William Good has completed 3 cycles of FOLFOX.  He had diarrhea following cycle 3.  We will dose reduce the 5-FU bolus and infusion beginning with cycle 4.  The platelets are mildly decreased.  He will call for bleeding.  William Good will return for an office visit and chemotherapy in 2 weeks.  His blood pressure has been running low here.  We will ask him to follow-up with Dr. Sheryn Bison to discuss decreasing the blood pressure medication.  Betsy Coder, MD  11/26/2020  9:29 AM

## 2020-11-26 NOTE — Progress Notes (Signed)
Nutrition follow up completed with patient during infusion for rectal cancer.  Weight decreased to 233.6 pounds from 236.4 pounds on August 4. Labs noted: K 3.4, Glucose 154, and Creatinine 1.34.  Patient reports nausea controlled with nausea medications.  Noted diarrhea for a few days after C3. He drinks Ensure occasionally. Denies changes in nutrition impact symptoms. He has no questions or concerns today.  Nutrition Diagnosis:  Unintended wt loss continues.  Intervention: Encouraged continued small frequent meals and snacks throughout the day. Supplement oral intake with oral nutrition supplements as needed. Stressed importance of wt maintenance.  Monitoring, Evaluation, Goals: Tolerate increased calories and protein for weight maintenance.  Next Visit:To be scheduled with radiation therapy as needed.

## 2020-11-26 NOTE — Telephone Encounter (Addendum)
Patient seen by Dr. Benay Spice today  Vitals are within treatment parameters.  Labs reviewed by Dr. Benay Spice and are not all within treatment parameters.   Creatinine 1.34 Potassium 3.4 Platelet 85 Ok to treat Per Dr Benay Spice    Per physician team, patient is ready for treatment. Please note that modifications are being made to the treatment plan including   Dose reduced

## 2020-11-28 ENCOUNTER — Other Ambulatory Visit: Payer: Self-pay

## 2020-11-28 ENCOUNTER — Inpatient Hospital Stay: Payer: Medicare Other

## 2020-11-28 VITALS — BP 113/86 | HR 70 | Temp 98.0°F | Resp 18

## 2020-11-28 DIAGNOSIS — D701 Agranulocytosis secondary to cancer chemotherapy: Secondary | ICD-10-CM | POA: Diagnosis not present

## 2020-11-28 DIAGNOSIS — C20 Malignant neoplasm of rectum: Secondary | ICD-10-CM | POA: Diagnosis not present

## 2020-11-28 DIAGNOSIS — Z5111 Encounter for antineoplastic chemotherapy: Secondary | ICD-10-CM | POA: Diagnosis not present

## 2020-11-28 DIAGNOSIS — T451X5A Adverse effect of antineoplastic and immunosuppressive drugs, initial encounter: Secondary | ICD-10-CM | POA: Diagnosis not present

## 2020-11-28 DIAGNOSIS — Z79899 Other long term (current) drug therapy: Secondary | ICD-10-CM | POA: Diagnosis not present

## 2020-11-28 DIAGNOSIS — Z5189 Encounter for other specified aftercare: Secondary | ICD-10-CM | POA: Diagnosis not present

## 2020-11-28 DIAGNOSIS — E119 Type 2 diabetes mellitus without complications: Secondary | ICD-10-CM | POA: Diagnosis not present

## 2020-11-28 DIAGNOSIS — I10 Essential (primary) hypertension: Secondary | ICD-10-CM | POA: Diagnosis not present

## 2020-11-28 MED ORDER — PEGFILGRASTIM-BMEZ 6 MG/0.6ML ~~LOC~~ SOSY
6.0000 mg | PREFILLED_SYRINGE | Freq: Once | SUBCUTANEOUS | Status: AC
  Administered 2020-11-28: 6 mg via SUBCUTANEOUS

## 2020-11-28 MED ORDER — SODIUM CHLORIDE 0.9% FLUSH
10.0000 mL | INTRAVENOUS | Status: DC | PRN
  Administered 2020-11-28: 10 mL

## 2020-11-28 MED ORDER — HEPARIN SOD (PORK) LOCK FLUSH 100 UNIT/ML IV SOLN
500.0000 [IU] | Freq: Once | INTRAVENOUS | Status: AC | PRN
  Administered 2020-11-28: 500 [IU]

## 2020-11-28 NOTE — Patient Instructions (Signed)

## 2020-12-01 ENCOUNTER — Telehealth: Payer: Self-pay | Admitting: Radiation Oncology

## 2020-12-01 NOTE — Telephone Encounter (Signed)
Called patient to reschedule his 11/10 appt. No answer, LVM for return call.

## 2020-12-05 ENCOUNTER — Other Ambulatory Visit: Payer: Self-pay | Admitting: Nurse Practitioner

## 2020-12-05 DIAGNOSIS — C20 Malignant neoplasm of rectum: Secondary | ICD-10-CM

## 2020-12-07 ENCOUNTER — Other Ambulatory Visit: Payer: Self-pay | Admitting: Oncology

## 2020-12-08 ENCOUNTER — Encounter: Payer: Self-pay | Admitting: Oncology

## 2020-12-08 DIAGNOSIS — C20 Malignant neoplasm of rectum: Secondary | ICD-10-CM | POA: Diagnosis not present

## 2020-12-10 ENCOUNTER — Inpatient Hospital Stay: Payer: Medicare Other | Admitting: Nurse Practitioner

## 2020-12-10 ENCOUNTER — Inpatient Hospital Stay: Payer: Medicare Other

## 2020-12-10 ENCOUNTER — Inpatient Hospital Stay: Payer: Medicare Other | Attending: Oncology

## 2020-12-10 ENCOUNTER — Telehealth: Payer: Self-pay

## 2020-12-10 ENCOUNTER — Other Ambulatory Visit: Payer: Self-pay

## 2020-12-10 ENCOUNTER — Encounter: Payer: Self-pay | Admitting: Nurse Practitioner

## 2020-12-10 VITALS — BP 105/74 | HR 89 | Temp 97.8°F | Resp 20 | Ht 72.0 in | Wt 234.0 lb

## 2020-12-10 DIAGNOSIS — Z5189 Encounter for other specified aftercare: Secondary | ICD-10-CM | POA: Insufficient documentation

## 2020-12-10 DIAGNOSIS — Z5111 Encounter for antineoplastic chemotherapy: Secondary | ICD-10-CM | POA: Diagnosis not present

## 2020-12-10 DIAGNOSIS — C20 Malignant neoplasm of rectum: Secondary | ICD-10-CM

## 2020-12-10 LAB — CBC WITH DIFFERENTIAL (CANCER CENTER ONLY)
Abs Immature Granulocytes: 0.67 10*3/uL — ABNORMAL HIGH (ref 0.00–0.07)
Basophils Absolute: 0.1 10*3/uL (ref 0.0–0.1)
Basophils Relative: 1 %
Eosinophils Absolute: 0 10*3/uL (ref 0.0–0.5)
Eosinophils Relative: 0 %
HCT: 39.8 % (ref 39.0–52.0)
Hemoglobin: 12.4 g/dL — ABNORMAL LOW (ref 13.0–17.0)
Immature Granulocytes: 6 %
Lymphocytes Relative: 29 %
Lymphs Abs: 3.3 10*3/uL (ref 0.7–4.0)
MCH: 27.7 pg (ref 26.0–34.0)
MCHC: 31.2 g/dL (ref 30.0–36.0)
MCV: 89 fL (ref 80.0–100.0)
Monocytes Absolute: 1 10*3/uL (ref 0.1–1.0)
Monocytes Relative: 8 %
Neutro Abs: 6.4 10*3/uL (ref 1.7–7.7)
Neutrophils Relative %: 56 %
Platelet Count: 91 10*3/uL — ABNORMAL LOW (ref 150–400)
RBC: 4.47 MIL/uL (ref 4.22–5.81)
RDW: 17.6 % — ABNORMAL HIGH (ref 11.5–15.5)
WBC Count: 11.5 10*3/uL — ABNORMAL HIGH (ref 4.0–10.5)
nRBC: 0.3 % — ABNORMAL HIGH (ref 0.0–0.2)

## 2020-12-10 LAB — CMP (CANCER CENTER ONLY)
ALT: 13 U/L (ref 0–44)
AST: 17 U/L (ref 15–41)
Albumin: 3.4 g/dL — ABNORMAL LOW (ref 3.5–5.0)
Alkaline Phosphatase: 152 U/L — ABNORMAL HIGH (ref 38–126)
Anion gap: 10 (ref 5–15)
BUN: 10 mg/dL (ref 8–23)
CO2: 28 mmol/L (ref 22–32)
Calcium: 8.3 mg/dL — ABNORMAL LOW (ref 8.9–10.3)
Chloride: 101 mmol/L (ref 98–111)
Creatinine: 1.3 mg/dL — ABNORMAL HIGH (ref 0.61–1.24)
GFR, Estimated: >60 mL/min (ref >60)
Glucose, Bld: 205 mg/dL — ABNORMAL HIGH (ref 70–99)
Potassium: 3.1 mmol/L — ABNORMAL LOW (ref 3.5–5.1)
Sodium: 139 mmol/L (ref 135–145)
Total Bilirubin: 0.5 mg/dL (ref 0.3–1.2)
Total Protein: 5.9 g/dL — ABNORMAL LOW (ref 6.5–8.1)

## 2020-12-10 MED ORDER — SODIUM CHLORIDE 0.9 % IV SOLN
10.0000 mg | Freq: Once | INTRAVENOUS | Status: AC
  Administered 2020-12-10: 10 mg via INTRAVENOUS
  Filled 2020-12-10: qty 1

## 2020-12-10 MED ORDER — LEUCOVORIN CALCIUM INJECTION 350 MG
300.0000 mg/m2 | Freq: Once | INTRAVENOUS | Status: AC
  Administered 2020-12-10: 696 mg via INTRAVENOUS
  Filled 2020-12-10: qty 34.8

## 2020-12-10 MED ORDER — DEXTROSE 5 % IV SOLN: Freq: Once | INTRAVENOUS | Status: AC

## 2020-12-10 MED ORDER — PALONOSETRON HCL INJECTION 0.25 MG/5ML
0.2500 mg | Freq: Once | INTRAVENOUS | Status: AC
  Administered 2020-12-10: 0.25 mg via INTRAVENOUS
  Filled 2020-12-10: qty 5

## 2020-12-10 MED ORDER — SODIUM CHLORIDE 0.9 % IV SOLN
2000.0000 mg/m2 | INTRAVENOUS | Status: DC
  Administered 2020-12-10: 4650 mg via INTRAVENOUS
  Filled 2020-12-10: qty 93

## 2020-12-10 MED ORDER — OXALIPLATIN CHEMO INJECTION 100 MG/20ML
65.0000 mg/m2 | Freq: Once | INTRAVENOUS | Status: AC
  Administered 2020-12-10: 150 mg via INTRAVENOUS
  Filled 2020-12-10: qty 20

## 2020-12-10 NOTE — Telephone Encounter (Signed)
verbalized understanding to take one potassium pill twice daily Wed, Thurs and Friday and then back to One pill daily on sat  per L. Marcello Moores

## 2020-12-10 NOTE — Patient Instructions (Signed)
[place]   Discharge Instructions: Thank you for choosing [facility] to provide your oncology and hematology care.   If you have a lab appointment with the [facility], please go directly to the [facility] and check in at the registration area.   Wear comfortable clothing and clothing appropriate for easy access to any Portacath or PICC line.   We strive to give you quality time with your provider. You may need to reschedule your appointment if you arrive late (15 or more minutes).  Arriving late affects you and other patients whose appointments are after yours.  Also, if you miss three or more appointments without notifying the office, you may be dismissed from the clinic at the provider's discretion.      For prescription refill requests, have your pharmacy contact our office and allow 72 hours for refills to be completed.    Today you received the following chemotherapy and/or immunotherapy agents Oxaliplatin (ELOXATIN), Leucovorin & Flourouracil (ADRUCIL).      To help prevent nausea and vomiting after your treatment, we encourage you to take your nausea medication as directed.  BELOW ARE SYMPTOMS THAT SHOULD BE REPORTED IMMEDIATELY: *FEVER GREATER THAN 100.4 F (38 C) OR HIGHER *CHILLS OR SWEATING *NAUSEA AND VOMITING THAT IS NOT CONTROLLED WITH YOUR NAUSEA MEDICATION *UNUSUAL SHORTNESS OF BREATH *UNUSUAL BRUISING OR BLEEDING *URINARY PROBLEMS (pain or burning when urinating, or frequent urination) *BOWEL PROBLEMS (unusual diarrhea, constipation, pain near the anus) TENDERNESS IN MOUTH AND THROAT WITH OR WITHOUT PRESENCE OF ULCERS (sore throat, sores in mouth, or a toothache) UNUSUAL RASH, SWELLING OR PAIN  UNUSUAL VAGINAL DISCHARGE OR ITCHING   Items with * indicate a potential emergency and should be followed up as soon as possible or go to the Emergency Department if any problems should occur.  Please show the CHEMOTHERAPY ALERT  CARD or IMMUNOTHERAPY ALERT CARD at check-in to the Emergency Department and triage nurse.  Should you have questions after your visit or need to cancel or reschedule your appointment, please contact [facility]  Dept: [phone]  and follow the prompts.  Office hours are 8:00 a.m. to 4:30 p.m. Monday - Friday. Please note that voicemails left after 4:00 p.m. may not be returned until the following business day.  We are closed weekends and major holidays. You have access to a nurse at all times for urgent questions. Please call the main number to the clinic Dept: [phone] and follow the prompts.   For any non-urgent questions, you may also contact your provider using MyChart. We now offer e-Visits for anyone 34 and older to request care online for non-urgent symptoms. For details visit [email or web address].   Also download the MyChart app! Go to the app store, search "MyChart", open the app, select [facility], and log in with your MyChart username and password.  Due to Covid, a mask is required upon entering the hospital/clinic. If you do not have a mask, one will be given to you upon arrival. For doctor visits, patients may have 1 support person aged 67 or older with them. For treatment visits, patients cannot have anyone with them due to current Covid guidelines and our immunocompromised population.   Oxaliplatin Injection What is this medication? OXALIPLATIN (ox AL i PLA tin) is a chemotherapy drug. It targets fast dividing cells, like cancer cells, and causes these cells to die. This medicine is used to treat cancers of the colon and rectum, and  many other cancers. This medicine may be used for other purposes; ask your health care provider or pharmacist if you have questions. COMMON BRAND NAME(S): Eloxatin What should I tell my care team before I take this medication? They need to know if you have any of these conditions: heart disease history of irregular  heartbeat liver disease low blood counts, like white cells, platelets, or red blood cells lung or breathing disease, like asthma take medicines that treat or prevent blood clots tingling of the fingers or toes, or other nerve disorder an unusual or allergic reaction to oxaliplatin, other chemotherapy, other medicines, foods, dyes, or preservatives pregnant or trying to get pregnant breast-feeding How should I use this medication? This drug is given as an infusion into a vein. It is administered in a hospital or clinic by a specially trained health care professional. Talk to your pediatrician regarding the use of this medicine in children. Special care may be needed. Overdosage: If you think you have taken too much of this medicine contact a poison control center or emergency room at once. NOTE: This medicine is only for you. Do not share this medicine with others. What if I miss a dose? It is important not to miss a dose. Call your doctor or health care professional if you are unable to keep an appointment. What may interact with this medication? Do not take this medicine with any of the following medications: cisapride dronedarone pimozide thioridazine This medicine may also interact with the following medications: aspirin and aspirin-like medicines certain medicines that treat or prevent blood clots like warfarin, apixaban, dabigatran, and rivaroxaban cisplatin cyclosporine diuretics medicines for infection like acyclovir, adefovir, amphotericin B, bacitracin, cidofovir, foscarnet, ganciclovir, gentamicin, pentamidine, vancomycin NSAIDs, medicines for pain and inflammation, like ibuprofen or naproxen other medicines that prolong the QT interval (an abnormal heart rhythm) pamidronate zoledronic acid This list may not describe all possible interactions. Give your health care provider a list of all the medicines, herbs, non-prescription drugs, or dietary supplements you use. Also tell  them if you smoke, drink alcohol, or use illegal drugs. Some items may interact with your medicine. What should I watch for while using this medication? Your condition will be monitored carefully while you are receiving this medicine. You may need blood work done while you are taking this medicine. This medicine may make you feel generally unwell. This is not uncommon as chemotherapy can affect healthy cells as well as cancer cells. Report any side effects. Continue your course of treatment even though you feel ill unless your healthcare professional tells you to stop. This medicine can make you more sensitive to cold. Do not drink cold drinks or use ice. Cover exposed skin before coming in contact with cold temperatures or cold objects. When out in cold weather wear warm clothing and cover your mouth and nose to warm the air that goes into your lungs. Tell your doctor if you get sensitive to the cold. Do not become pregnant while taking this medicine or for 9 months after stopping it. Women should inform their health care professional if they wish to become pregnant or think they might be pregnant. Men should not father a child while taking this medicine and for 6 months after stopping it. There is potential for serious side effects to an unborn child. Talk to your health care professional for more information. Do not breast-feed a child while taking this medicine or for 3 months after stopping it. This medicine has caused ovarian failure in  some women. This medicine may make it more difficult to get pregnant. Talk to your health care professional if you are concerned about your fertility. This medicine has caused decreased sperm counts in some men. This may make it more difficult to father a child. Talk to your health care professional if you are concerned about your fertility. This medicine may increase your risk of getting an infection. Call your health care professional for advice if you get a fever,  chills, or sore throat, or other symptoms of a cold or flu. Do not treat yourself. Try to avoid being around people who are sick. Avoid taking medicines that contain aspirin, acetaminophen, ibuprofen, naproxen, or ketoprofen unless instructed by your health care professional. These medicines may hide a fever. Be careful brushing or flossing your teeth or using a toothpick because you may get an infection or bleed more easily. If you have any dental work done, tell your dentist you are receiving this medicine. What side effects may I notice from receiving this medication? Side effects that you should report to your doctor or health care professional as soon as possible: allergic reactions like skin rash, itching or hives, swelling of the face, lips, or tongue breathing problems cough low blood counts - this medicine may decrease the number of white blood cells, red blood cells, and platelets. You may be at increased risk for infections and bleeding nausea, vomiting pain, redness, or irritation at site where injected pain, tingling, numbness in the hands or feet signs and symptoms of bleeding such as bloody or black, tarry stools; red or dark brown urine; spitting up blood or brown material that looks like coffee grounds; red spots on the skin; unusual bruising or bleeding from the eyes, gums, or nose signs and symptoms of a dangerous change in heartbeat or heart rhythm like chest pain; dizziness; fast, irregular heartbeat; palpitations; feeling faint or lightheaded; falls signs and symptoms of infection like fever; chills; cough; sore throat; pain or trouble passing urine signs and symptoms of liver injury like dark yellow or brown urine; general ill feeling or flu-like symptoms; light-colored stools; loss of appetite; nausea; right upper belly pain; unusually weak or tired; yellowing of the eyes or skin signs and symptoms of low red blood cells or anemia such as unusually weak or tired; feeling faint  or lightheaded; falls signs and symptoms of muscle injury like dark urine; trouble passing urine or change in the amount of urine; unusually weak or tired; muscle pain; back pain Side effects that usually do not require medical attention (report to your doctor or health care professional if they continue or are bothersome): changes in taste diarrhea gas hair loss loss of appetite mouth sores This list may not describe all possible side effects. Call your doctor for medical advice about side effects. You may report side effects to FDA at 1-800-FDA-1088. Where should I keep my medication? This drug is given in a hospital or clinic and will not be stored at home. NOTE: This sheet is a summary. It may not cover all possible information. If you have questions about this medicine, talk to your doctor, pharmacist, or health care provider.  2022 Elsevier/Gold Standard (2018-07-12 12:20:35)  Leucovorin injection What is this medication? LEUCOVORIN (loo koe VOR in) is used to prevent or treat the harmful effects of some medicines. This medicine is used to treat anemia caused by a low amount of folic acid in the body. It is also used with 5-fluorouracil (5-FU) to treat colon  cancer. This medicine may be used for other purposes; ask your health care provider or pharmacist if you have questions. What should I tell my care team before I take this medication? They need to know if you have any of these conditions: anemia from low levels of vitamin B-12 in the blood an unusual or allergic reaction to leucovorin, folic acid, other medicines, foods, dyes, or preservatives pregnant or trying to get pregnant breast-feeding How should I use this medication? This medicine is for injection into a muscle or into a vein. It is given by a health care professional in a hospital or clinic setting. Talk to your pediatrician regarding the use of this medicine in children. Special care may be needed. Overdosage: If you  think you have taken too much of this medicine contact a poison control center or emergency room at once. NOTE: This medicine is only for you. Do not share this medicine with others. What if I miss a dose? This does not apply. What may interact with this medication? capecitabine fluorouracil phenobarbital phenytoin primidone trimethoprim-sulfamethoxazole This list may not describe all possible interactions. Give your health care provider a list of all the medicines, herbs, non-prescription drugs, or dietary supplements you use. Also tell them if you smoke, drink alcohol, or use illegal drugs. Some items may interact with your medicine. What should I watch for while using this medication? Your condition will be monitored carefully while you are receiving this medicine. This medicine may increase the side effects of 5-fluorouracil, 5-FU. Tell your doctor or health care professional if you have diarrhea or mouth sores that do not get better or that get worse. What side effects may I notice from receiving this medication? Side effects that you should report to your doctor or health care professional as soon as possible: allergic reactions like skin rash, itching or hives, swelling of the face, lips, or tongue breathing problems fever, infection mouth sores unusual bleeding or bruising unusually weak or tired Side effects that usually do not require medical attention (report to your doctor or health care professional if they continue or are bothersome): constipation or diarrhea loss of appetite nausea, vomiting This list may not describe all possible side effects. Call your doctor for medical advice about side effects. You may report side effects to FDA at 1-800-FDA-1088. Where should I keep my medication? This drug is given in a hospital or clinic and will not be stored at home. NOTE: This sheet is a summary. It may not cover all possible information. If you have questions about this  medicine, talk to your doctor, pharmacist, or health care provider.  2022 Elsevier/Gold Standard (2007-08-29 16:50:29)  Fluorouracil, 5-FU injection What is this medication? FLUOROURACIL, 5-FU (flure oh YOOR a sil) is a chemotherapy drug. It slows the growth of cancer cells. This medicine is used to treat many types of cancer like breast cancer, colon or rectal cancer, pancreatic cancer, and stomach cancer. This medicine may be used for other purposes; ask your health care provider or pharmacist if you have questions. COMMON BRAND NAME(S): Adrucil What should I tell my care team before I take this medication? They need to know if you have any of these conditions: blood disorders dihydropyrimidine dehydrogenase (DPD) deficiency infection (especially a virus infection such as chickenpox, cold sores, or herpes) kidney disease liver disease malnourished, poor nutrition recent or ongoing radiation therapy an unusual or allergic reaction to fluorouracil, other chemotherapy, other medicines, foods, dyes, or preservatives pregnant or trying to get pregnant  breast-feeding How should I use this medication? This drug is given as an infusion or injection into a vein. It is administered in a hospital or clinic by a specially trained health care professional. Talk to your pediatrician regarding the use of this medicine in children. Special care may be needed. Overdosage: If you think you have taken too much of this medicine contact a poison control center or emergency room at once. NOTE: This medicine is only for you. Do not share this medicine with others. What if I miss a dose? It is important not to miss your dose. Call your doctor or health care professional if you are unable to keep an appointment. What may interact with this medication? Do not take this medicine with any of the following medications: live virus vaccines This medicine may also interact with the following medications: medicines  that treat or prevent blood clots like warfarin, enoxaparin, and dalteparin This list may not describe all possible interactions. Give your health care provider a list of all the medicines, herbs, non-prescription drugs, or dietary supplements you use. Also tell them if you smoke, drink alcohol, or use illegal drugs. Some items may interact with your medicine. What should I watch for while using this medication? Visit your doctor for checks on your progress. This drug may make you feel generally unwell. This is not uncommon, as chemotherapy can affect healthy cells as well as cancer cells. Report any side effects. Continue your course of treatment even though you feel ill unless your doctor tells you to stop. In some cases, you may be given additional medicines to help with side effects. Follow all directions for their use. Call your doctor or health care professional for advice if you get a fever, chills or sore throat, or other symptoms of a cold or flu. Do not treat yourself. This drug decreases your body's ability to fight infections. Try to avoid being around people who are sick. This medicine may increase your risk to bruise or bleed. Call your doctor or health care professional if you notice any unusual bleeding. Be careful brushing and flossing your teeth or using a toothpick because you may get an infection or bleed more easily. If you have any dental work done, tell your dentist you are receiving this medicine. Avoid taking products that contain aspirin, acetaminophen, ibuprofen, naproxen, or ketoprofen unless instructed by your doctor. These medicines may hide a fever. Do not become pregnant while taking this medicine. Women should inform their doctor if they wish to become pregnant or think they might be pregnant. There is a potential for serious side effects to an unborn child. Talk to your health care professional or pharmacist for more information. Do not breast-feed an infant while taking  this medicine. Men should inform their doctor if they wish to father a child. This medicine may lower sperm counts. Do not treat diarrhea with over the counter products. Contact your doctor if you have diarrhea that lasts more than 2 days or if it is severe and watery. This medicine can make you more sensitive to the sun. Keep out of the sun. If you cannot avoid being in the sun, wear protective clothing and use sunscreen. Do not use sun lamps or tanning beds/booths. What side effects may I notice from receiving this medication? Side effects that you should report to your doctor or health care professional as soon as possible: allergic reactions like skin rash, itching or hives, swelling of the face, lips, or tongue low blood  counts - this medicine may decrease the number of white blood cells, red blood cells and platelets. You may be at increased risk for infections and bleeding. signs of infection - fever or chills, cough, sore throat, pain or difficulty passing urine signs of decreased platelets or bleeding - bruising, pinpoint red spots on the skin, black, tarry stools, blood in the urine signs of decreased red blood cells - unusually weak or tired, fainting spells, lightheadedness breathing problems changes in vision chest pain mouth sores nausea and vomiting pain, swelling, redness at site where injected pain, tingling, numbness in the hands or feet redness, swelling, or sores on hands or feet stomach pain unusual bleeding Side effects that usually do not require medical attention (report to your doctor or health care professional if they continue or are bothersome): changes in finger or toe nails diarrhea dry or itchy skin hair loss headache loss of appetite sensitivity of eyes to the light stomach upset unusually teary eyes This list may not describe all possible side effects. Call your doctor for medical advice about side effects. You may report side effects to FDA at  1-800-FDA-1088. Where should I keep my medication? This drug is given in a hospital or clinic and will not be stored at home. NOTE: This sheet is a summary. It may not cover all possible information. If you have questions about this medicine, talk to your doctor, pharmacist, or health care provider.  2022 Elsevier/Gold Standard (2019-01-23 15:00:03)  The chemotherapy medication bag should finish at 46 hours, 96 hours, or 7 days. For example, if your pump is scheduled for 46 hours and it was put on at 4:00 p.m., it should finish at 2:00 p.m. the day it is scheduled to come off regardless of your appointment time.     Estimated time to finish at 1:35 p.m. on Friday 12/12/2020.   If the display on your pump reads "Low Volume" and it is beeping, take the batteries out of the pump and come to the cancer center for it to be taken off.   If the pump alarms go off prior to the pump reading "Low Volume" then call [phone] and someone can assist you.  If the plunger comes out and the chemotherapy medication is leaking out, please use your home chemo spill kit to clean up the spill. Do NOT use paper towels or other household products.  If you have problems or questions regarding your pump, please call either [phone] (24 hours a day) or the cancer center Monday-Friday 8:00 a.m.- 4:30 p.m. at the clinic number and we will assist you. If you are unable to get assistance, then go to the nearest Emergency Department and ask the staff to contact the IV team for assistance.

## 2020-12-10 NOTE — Progress Notes (Signed)
Patient presents for treatment. RN assessment completed along with the following:  Labs/vitals reviewed - Yes, and Platelet 91, okay to proceed per Lattie Haw, NP.    Weight within 10% of previous measurement - Yes Oncology Treatment Attestation completed for current therapy- Yes, on date 09/29/2020 Informed consent completed and reflects current therapy/intent - Yes, on date 10/08/2020             Provider progress note reviewed - Yes, today's provider note was reviewed. Treatment/Antibody/Supportive plan reviewed - Yes, and there are no adjustments needed for today's treatment. S&H and other orders reviewed - Yes, and there are no additional orders identified. Previous treatment date reviewed - Yes, and the appropriate amount of time has elapsed between treatments. Clinic Hand Off Received from - Yes from [facility], NP.  Patient to proceed with treatment.

## 2020-12-10 NOTE — Progress Notes (Signed)
  [facility] OFFICE PROGRESS NOTE   Diagnosis:  Rectal cancer  INTERVAL HISTORY:   William Good returns as scheduled.  He completed cycle 4 FOLFOX 11/26/2020.  5-FU bolus and infusion dose reduced secondary to diarrhea.  Mild nausea.  No vomiting.  He had 1 or 2 mouth sores which have resolved.  Cold sensitivity lasted about 2 days.  No persistent neuropathy symptoms.  He again had diarrhea beginning day 1.  He estimates 4-5 stools a day for 3 days, watery at times.  Antidiarrheal medication helped.  Objective:  Vital signs in last 24 hours:  Blood pressure 105/74, pulse 89, temperature 97.8 F (36.6 C), temperature source Oral, resp. rate 20, height 6' (1.829 m), weight 234 lb (106.1 kg), SpO2 100 %.    HEENT: No thrush or ulcers. Resp: Lungs clear bilaterally.   Cardio: Regular rate and rhythm, distant heart sounds. GI: Abdomen soft and nontender.  No hepatomegaly. Vascular: No leg edema. Neuro: Vibratory sense mildly decreased over the fingertips per tuning fork exam. Skin: Palms without erythema. Port-A-Cath without erythema.   Lab Results:  Lab Results  Component Value Date   WBC 11.5 (H) 12/10/2020   HGB 12.4 (L) 12/10/2020   HCT 39.8 12/10/2020   MCV 89.0 12/10/2020   PLT 91 (L) 12/10/2020   NEUTROABS PENDING 12/10/2020    Imaging:  No results found.  Medications: I have reviewed the patient's current medications.  Assessment/Plan: Rectal cancer Mass at 6 cm on digital exam/anoscopy 08/25/2020 Colonoscopy 09/05/2020-mid rectal mass-biopsy, adenocarcinoma, no loss of mismatch repair protein expression CT abdomen/pelvis 08/29/2020-small amount of free fluid in the pelvis, mildly enlarged left external iliac node, cystic changes of the left kidney with hydronephrosis and regions of cortical thinning with transition at the left UPJ suggesting chronic left UPJ obstruction MRI pelvis 09/24/2020-tumor 6 cm from the anal verge, 1.8 cm from the internal anal  sphincter, T2, 7 mm lymph node in the left mesorectal sheath, N1 Cycle 1 FOLFOX 10/08/2020 Cycle 2 FOLFOX 10/22/2020 Cycle 3 FOLFOX 11/12/2020 (oxaliplatin dose reduced, white cell growth factor support on day of pump discontinuation) Cycle 4 FOLFOX 11/26/2020, 5-FU bolus and infusion dose reduced secondary to diarrhea, Ziextenzo Cycle 5 FOLFOX 12/10/2020, 5-FU bolus held due to diarrhea, Ziextenzo     Diabetes Chronic kidney disease Bipolar disorder Hypertension Hyperlipidemia Restless leg syndrome Multiple colon polyps removed on the colonoscopy 09/05/2020-tubular adenomas including a rectal polyp with high-grade dysplasia-polypectomy margin negative for dysplasia Portacath placement, Dr William Good, 10/02/2020    Disposition: William Good appears stable.  He has completed 4 cycles of FOLFOX.  5-FU bolus and infusion were dose reduced with cycle 4 due to diarrhea.  He again had diarrhea and also a few mouth sores with cycle 4.  We decided to hold the 5-FU bolus today.  Plan to proceed with cycle 5 FOLFOX today as scheduled, no 5-FU bolus.  CBC pending.  Chemistry panel reviewed, adequate to proceed with treatment.  He has hypokalemia.  He will increase K-Dur to 20 mEq twice daily for the next 3 days, then 1 daily.  He will return for lab, follow-up, cycle 6 FOLFOX in 2 weeks.  He will contact the office in the interim with any problems.  We specifically discussed diarrhea and mouth sores.  Plan reviewed with William Good.   William Good ANP/GNP-BC   12/10/2020  11:26 AM

## 2020-12-12 ENCOUNTER — Inpatient Hospital Stay: Payer: Medicare Other

## 2020-12-12 ENCOUNTER — Other Ambulatory Visit: Payer: Self-pay

## 2020-12-12 VITALS — BP 117/76 | HR 69 | Temp 98.2°F | Resp 18

## 2020-12-12 DIAGNOSIS — C20 Malignant neoplasm of rectum: Secondary | ICD-10-CM | POA: Diagnosis not present

## 2020-12-12 DIAGNOSIS — Z5111 Encounter for antineoplastic chemotherapy: Secondary | ICD-10-CM | POA: Diagnosis not present

## 2020-12-12 DIAGNOSIS — Z5189 Encounter for other specified aftercare: Secondary | ICD-10-CM | POA: Diagnosis not present

## 2020-12-12 MED ORDER — PEGFILGRASTIM-BMEZ 6 MG/0.6ML ~~LOC~~ SOSY
6.0000 mg | PREFILLED_SYRINGE | Freq: Once | SUBCUTANEOUS | Status: AC
  Administered 2020-12-12: 6 mg via SUBCUTANEOUS

## 2020-12-12 MED ORDER — SODIUM CHLORIDE 0.9% FLUSH
10.0000 mL | INTRAVENOUS | Status: DC | PRN
  Administered 2020-12-12: 10 mL

## 2020-12-12 MED ORDER — HEPARIN SOD (PORK) LOCK FLUSH 100 UNIT/ML IV SOLN
500.0000 [IU] | Freq: Once | INTRAVENOUS | Status: AC | PRN
Start: 2020-12-12 — End: 2020-12-12
  Administered 2020-12-12: 500 [IU]

## 2020-12-12 NOTE — Patient Instructions (Signed)
[facility]  Discharge Instructions: Thank you for choosing [facility] to provide your oncology and hematology care.   If you have a lab appointment with the [facility], please go directly to the [facility] and check in at the registration area.   Wear comfortable clothing and clothing appropriate for easy access to any Portacath or PICC line.   We strive to give you quality time with your provider. You may need to reschedule your appointment if you arrive late (15 or more minutes).  Arriving late affects you and other patients whose appointments are after yours.  Also, if you miss three or more appointments without notifying the office, you may be dismissed from the clinic at the provider's discretion.      For prescription refill requests, have your pharmacy contact our office and allow 72 hours for refills to be completed.    Today you received the following chemotherapy and/or immunotherapy agents pump stop and ZIEXTENXO      To help prevent nausea and vomiting after your treatment, we encourage you to take your nausea medication as directed.  BELOW ARE SYMPTOMS THAT SHOULD BE REPORTED IMMEDIATELY: *FEVER GREATER THAN 100.4 F (38 C) OR HIGHER *CHILLS OR SWEATING *NAUSEA AND VOMITING THAT IS NOT CONTROLLED WITH YOUR NAUSEA MEDICATION *UNUSUAL SHORTNESS OF BREATH *UNUSUAL BRUISING OR BLEEDING *URINARY PROBLEMS (pain or burning when urinating, or frequent urination) *BOWEL PROBLEMS (unusual diarrhea, constipation, pain near the anus) TENDERNESS IN MOUTH AND THROAT WITH OR WITHOUT PRESENCE OF ULCERS (sore throat, sores in mouth, or a toothache) UNUSUAL RASH, SWELLING OR PAIN  UNUSUAL VAGINAL DISCHARGE OR ITCHING   Items with * indicate a potential emergency and should be followed up as soon as possible or go to the Emergency Department if any problems should occur.  Please show the CHEMOTHERAPY ALERT CARD or IMMUNOTHERAPY ALERT CARD at  check-in to the Emergency Department and triage nurse.  Should you have questions after your visit or need to cancel or reschedule your appointment, please contact [facility]  Dept: [phone]  and follow the prompts.  Office hours are 8:00 a.m. to 4:30 p.m. Monday - Friday. Please note that voicemails left after 4:00 p.m. may not be returned until the following business day.  We are closed weekends and major holidays. You have access to a nurse at all times for urgent questions. Please call the main number to the clinic Dept: [phone] and follow the prompts.   For any non-urgent questions, you may also contact your provider using MyChart. We now offer e-Visits for anyone 13 and older to request care online for non-urgent symptoms. For details visit [email or web address].   Also download the MyChart app! Go to the app store, search "MyChart", open the app, select [facility], and log in with your MyChart username and password.  Due to Covid, a mask is required upon entering the hospital/clinic. If you do not have a mask, one will be given to you upon arrival. For doctor visits, patients may have 1 support person aged 51 or older with them. For treatment visits, patients cannot have anyone with them due to current Covid guidelines and our immunocompromised population.  Pegfilgrastim injection What is this medication? PEGFILGRASTIM (PEG fil gra stim) is a long-acting granulocyte colony-stimulating factor that stimulates the growth of neutrophils, a type of white blood cell important in the body's fight against infection. It is used to reduce the incidence of fever and infection  in patients with certain types of cancer who are receiving chemotherapy that affects the bone marrow, and to increase survival after being exposed to high doses of radiation. This medicine may be used for other purposes; ask your health care provider or pharmacist if you have questions. COMMON  BRAND NAME(S): Rexene Edison, Ziextenzo What should I tell my care team before I take this medication? They need to know if you have any of these conditions: kidney disease latex allergy ongoing radiation therapy sickle cell disease skin reactions to acrylic adhesives (On-Body Injector only) an unusual or allergic reaction to pegfilgrastim, filgrastim, other medicines, foods, dyes, or preservatives pregnant or trying to get pregnant breast-feeding How should I use this medication? This medicine is for injection under the skin. If you get this medicine at home, you will be taught how to prepare and give the pre-filled syringe or how to use the On-body Injector. Refer to the patient Instructions for Use for detailed instructions. Use exactly as directed. Tell your healthcare provider immediately if you suspect that the On-body Injector may not have performed as intended or if you suspect the use of the On-body Injector resulted in a missed or partial dose. It is important that you put your used needles and syringes in a special sharps container. Do not put them in a trash can. If you do not have a sharps container, call your pharmacist or healthcare provider to get one. Talk to your pediatrician regarding the use of this medicine in children. While this drug may be prescribed for selected conditions, precautions do apply. Overdosage: If you think you have taken too much of this medicine contact a poison control center or emergency room at once. NOTE: This medicine is only for you. Do not share this medicine with others. What if I miss a dose? It is important not to miss your dose. Call your doctor or health care professional if you miss your dose. If you miss a dose due to an On-body Injector failure or leakage, a new dose should be administered as soon as possible using a single prefilled syringe for manual use. What may interact with this medication? Interactions have not  been studied. This list may not describe all possible interactions. Give your health care provider a list of all the medicines, herbs, non-prescription drugs, or dietary supplements you use. Also tell them if you smoke, drink alcohol, or use illegal drugs. Some items may interact with your medicine. What should I watch for while using this medication? Your condition will be monitored carefully while you are receiving this medicine. You may need blood work done while you are taking this medicine. Talk to your health care provider about your risk of cancer. You may be more at risk for certain types of cancer if you take this medicine. If you are going to need a MRI, CT scan, or other procedure, tell your doctor that you are using this medicine (On-Body Injector only). What side effects may I notice from receiving this medication? Side effects that you should report to your doctor or health care professional as soon as possible: allergic reactions (skin rash, itching or hives, swelling of the face, lips, or tongue) back pain dizziness fever pain, redness, or irritation at site where injected pinpoint red spots on the skin red or dark-brown urine shortness of breath or breathing problems stomach or side pain, or pain at the shoulder swelling tiredness trouble passing urine or change in the amount of urine unusual bruising  or bleeding Side effects that usually do not require medical attention (report to your doctor or health care professional if they continue or are bothersome): bone pain muscle pain This list may not describe all possible side effects. Call your doctor for medical advice about side effects. You may report side effects to FDA at 1-800-FDA-1088. Where should I keep my medication? Keep out of the reach of children. If you are using this medicine at home, you will be instructed on how to store it. Throw away any unused medicine after the expiration date on the label. NOTE: This  sheet is a summary. It may not cover all possible information. If you have questions about this medicine, talk to your doctor, pharmacist, or health care provider.  2022 [employer]/Gold Standard (2020-03-21 11:54:14)

## 2020-12-19 DIAGNOSIS — R0989 Other specified symptoms and signs involving the circulatory and respiratory systems: Secondary | ICD-10-CM | POA: Diagnosis not present

## 2020-12-21 ENCOUNTER — Other Ambulatory Visit: Payer: Self-pay | Admitting: Oncology

## 2020-12-24 ENCOUNTER — Inpatient Hospital Stay: Payer: Medicare Other

## 2020-12-24 ENCOUNTER — Other Ambulatory Visit: Payer: Self-pay

## 2020-12-24 ENCOUNTER — Telehealth: Payer: Self-pay | Admitting: *Deleted

## 2020-12-24 ENCOUNTER — Inpatient Hospital Stay: Payer: Medicare Other | Admitting: Oncology

## 2020-12-24 VITALS — BP 130/82 | HR 75 | Temp 97.8°F | Resp 18 | Ht 72.0 in | Wt 227.2 lb

## 2020-12-24 DIAGNOSIS — Z5189 Encounter for other specified aftercare: Secondary | ICD-10-CM | POA: Diagnosis not present

## 2020-12-24 DIAGNOSIS — C20 Malignant neoplasm of rectum: Secondary | ICD-10-CM

## 2020-12-24 DIAGNOSIS — Z5111 Encounter for antineoplastic chemotherapy: Secondary | ICD-10-CM | POA: Diagnosis not present

## 2020-12-24 LAB — CMP (CANCER CENTER ONLY)
ALT: 25 U/L (ref 0–44)
AST: 34 U/L (ref 15–41)
Albumin: 3.8 g/dL (ref 3.5–5.0)
Alkaline Phosphatase: 182 U/L — ABNORMAL HIGH (ref 38–126)
Anion gap: 8 (ref 5–15)
BUN: 11 mg/dL (ref 8–23)
CO2: 33 mmol/L — ABNORMAL HIGH (ref 22–32)
Calcium: 9.4 mg/dL (ref 8.9–10.3)
Chloride: 97 mmol/L — ABNORMAL LOW (ref 98–111)
Creatinine: 1.18 mg/dL (ref 0.61–1.24)
GFR, Estimated: >60 mL/min (ref >60)
Glucose, Bld: 204 mg/dL — ABNORMAL HIGH (ref 70–99)
Potassium: 3.9 mmol/L (ref 3.5–5.1)
Sodium: 138 mmol/L (ref 135–145)
Total Bilirubin: 0.4 mg/dL (ref 0.3–1.2)
Total Protein: 6.4 g/dL — ABNORMAL LOW (ref 6.5–8.1)

## 2020-12-24 LAB — CBC WITH DIFFERENTIAL (CANCER CENTER ONLY)
Abs Immature Granulocytes: 0.74 10*3/uL — ABNORMAL HIGH (ref 0.00–0.07)
Basophils Absolute: 0.1 10*3/uL (ref 0.0–0.1)
Basophils Relative: 1 %
Eosinophils Absolute: 0 10*3/uL (ref 0.0–0.5)
Eosinophils Relative: 0 %
HCT: 40.1 % (ref 39.0–52.0)
Hemoglobin: 12.4 g/dL — ABNORMAL LOW (ref 13.0–17.0)
Immature Granulocytes: 5 %
Lymphocytes Relative: 13 %
Lymphs Abs: 2.1 10*3/uL (ref 0.7–4.0)
MCH: 27.7 pg (ref 26.0–34.0)
MCHC: 30.9 g/dL (ref 30.0–36.0)
MCV: 89.7 fL (ref 80.0–100.0)
Monocytes Absolute: 1 10*3/uL (ref 0.1–1.0)
Monocytes Relative: 7 %
Neutro Abs: 12 10*3/uL — ABNORMAL HIGH (ref 1.7–7.7)
Neutrophils Relative %: 74 %
Platelet Count: 112 10*3/uL — ABNORMAL LOW (ref 150–400)
RBC: 4.47 MIL/uL (ref 4.22–5.81)
RDW: 18.6 % — ABNORMAL HIGH (ref 11.5–15.5)
WBC Count: 16 10*3/uL — ABNORMAL HIGH (ref 4.0–10.5)
nRBC: 0.1 % (ref 0.0–0.2)

## 2020-12-24 MED ORDER — PALONOSETRON HCL INJECTION 0.25 MG/5ML
0.2500 mg | Freq: Once | INTRAVENOUS | Status: AC
  Administered 2020-12-24: 0.25 mg via INTRAVENOUS
  Filled 2020-12-24: qty 5

## 2020-12-24 MED ORDER — OXALIPLATIN CHEMO INJECTION 100 MG/20ML
65.0000 mg/m2 | Freq: Once | INTRAVENOUS | Status: AC
  Administered 2020-12-24: 150 mg via INTRAVENOUS
  Filled 2020-12-24: qty 10

## 2020-12-24 MED ORDER — SODIUM CHLORIDE 0.9 % IV SOLN
10.0000 mg | Freq: Once | INTRAVENOUS | Status: AC
  Administered 2020-12-24: 10 mg via INTRAVENOUS
  Filled 2020-12-24: qty 1

## 2020-12-24 MED ORDER — SODIUM CHLORIDE 0.9 % IV SOLN
5000.0000 mg | INTRAVENOUS | Status: DC
  Administered 2020-12-24: 5000 mg via INTRAVENOUS
  Filled 2020-12-24: qty 100

## 2020-12-24 MED ORDER — LEUCOVORIN CALCIUM INJECTION 350 MG
300.0000 mg/m2 | Freq: Once | INTRAVENOUS | Status: AC
  Administered 2020-12-24: 688 mg via INTRAVENOUS
  Filled 2020-12-24: qty 34.4

## 2020-12-24 MED ORDER — DEXTROSE 5 % IV SOLN: Freq: Once | INTRAVENOUS | Status: AC

## 2020-12-24 NOTE — Progress Notes (Signed)
Patient seen by Dr. Sherrill today ? ?Vitals are within treatment parameters. ? ?Labs reviewed by Dr. Sherrill and are within treatment parameters. ? ?Per physician team, patient is ready for treatment and there are NO modifications to the treatment plan.  ?

## 2020-12-24 NOTE — Telephone Encounter (Signed)
error 

## 2020-12-24 NOTE — Patient Instructions (Signed)
[facility]   Discharge Instructions: Thank you for choosing [facility] to provide your oncology and hematology care.   If you have a lab appointment with the [facility], please go directly to the [facility] and check in at the registration area.   Wear comfortable clothing and clothing appropriate for easy access to any Portacath or PICC line.   We strive to give you quality time with your provider. You may need to reschedule your appointment if you arrive late (15 or more minutes).  Arriving late affects you and other patients whose appointments are after yours.  Also, if you miss three or more appointments without notifying the office, you may be dismissed from the clinic at the provider's discretion.      For prescription refill requests, have your pharmacy contact our office and allow 72 hours for refills to be completed.    Today you received the following chemotherapy and/or immunotherapy agents Oxaliplatin (ELOXATIN), Leucovorin & Flourouracil (ADRUCIL).      To help prevent nausea and vomiting after your treatment, we encourage you to take your nausea medication as directed.  BELOW ARE SYMPTOMS THAT SHOULD BE REPORTED IMMEDIATELY: *FEVER GREATER THAN 100.4 F (38 C) OR HIGHER *CHILLS OR SWEATING *NAUSEA AND VOMITING THAT IS NOT CONTROLLED WITH YOUR NAUSEA MEDICATION *UNUSUAL SHORTNESS OF BREATH *UNUSUAL BRUISING OR BLEEDING *URINARY PROBLEMS (pain or burning when urinating, or frequent urination) *BOWEL PROBLEMS (unusual diarrhea, constipation, pain near the anus) TENDERNESS IN MOUTH AND THROAT WITH OR WITHOUT PRESENCE OF ULCERS (sore throat, sores in mouth, or a toothache) UNUSUAL RASH, SWELLING OR PAIN  UNUSUAL VAGINAL DISCHARGE OR ITCHING   Items with * indicate a potential emergency and should be followed up as soon as possible or go to the Emergency Department if any problems should occur.  Please show the CHEMOTHERAPY ALERT  CARD or IMMUNOTHERAPY ALERT CARD at check-in to the Emergency Department and triage nurse.  Should you have questions after your visit or need to cancel or reschedule your appointment, please contact [facility]  Dept: [phone]  and follow the prompts.  Office hours are 8:00 a.m. to 4:30 p.m. Monday - Friday. Please note that voicemails left after 4:00 p.m. may not be returned until the following business day.  We are closed weekends and major holidays. You have access to a nurse at all times for urgent questions. Please call the main number to the clinic Dept: [phone] and follow the prompts.   For any non-urgent questions, you may also contact your provider using MyChart. We now offer e-Visits for anyone 23 and older to request care online for non-urgent symptoms. For details visit [email or web address].   Also download the MyChart app! Go to the app store, search "MyChart", open the app, select [facility], and log in with your MyChart username and password.  Due to Covid, a mask is required upon entering the hospital/clinic. If you do not have a mask, one will be given to you upon arrival. For doctor visits, patients may have 1 support person aged 68 or older with them. For treatment visits, patients cannot have anyone with them due to current Covid guidelines and our immunocompromised population.   Leucovorin injection What is this medication? LEUCOVORIN (loo koe VOR in) is used to prevent or treat the harmful effects of some medicines. This medicine is used to treat anemia caused by a low amount of folic acid in the body. It is  also used with 5-fluorouracil (5-FU) to treat colon cancer. This medicine may be used for other purposes; ask your health care provider or pharmacist if you have questions. What should I tell my care team before I take this medication? They need to know if you have any of these conditions: anemia from low levels of vitamin B-12 in  the blood an unusual or allergic reaction to leucovorin, folic acid, other medicines, foods, dyes, or preservatives pregnant or trying to get pregnant breast-feeding How should I use this medication? This medicine is for injection into a muscle or into a vein. It is given by a health care professional in a hospital or clinic setting. Talk to your pediatrician regarding the use of this medicine in children. Special care may be needed. Overdosage: If you think you have taken too much of this medicine contact a poison control center or emergency room at once. NOTE: This medicine is only for you. Do not share this medicine with others. What if I miss a dose? This does not apply. What may interact with this medication? capecitabine fluorouracil phenobarbital phenytoin primidone trimethoprim-sulfamethoxazole This list may not describe all possible interactions. Give your health care provider a list of all the medicines, herbs, non-prescription drugs, or dietary supplements you use. Also tell them if you smoke, drink alcohol, or use illegal drugs. Some items may interact with your medicine. What should I watch for while using this medication? Your condition will be monitored carefully while you are receiving this medicine. This medicine may increase the side effects of 5-fluorouracil, 5-FU. Tell your doctor or health care professional if you have diarrhea or mouth sores that do not get better or that get worse. What side effects may I notice from receiving this medication? Side effects that you should report to your doctor or health care professional as soon as possible: allergic reactions like skin rash, itching or hives, swelling of the face, lips, or tongue breathing problems fever, infection mouth sores unusual bleeding or bruising unusually weak or tired Side effects that usually do not require medical attention (report to your doctor or health care professional if they continue or are  bothersome): constipation or diarrhea loss of appetite nausea, vomiting This list may not describe all possible side effects. Call your doctor for medical advice about side effects. You may report side effects to FDA at 1-800-FDA-1088. Where should I keep my medication? This drug is given in a hospital or clinic and will not be stored at home. NOTE: This sheet is a summary. It may not cover all possible information. If you have questions about this medicine, talk to your doctor, pharmacist, or health care provider.  2022 Elsevier/Gold Standard (2007-08-29 16:50:29) Oxaliplatin Injection What is this medication? OXALIPLATIN (ox AL i PLA tin) is a chemotherapy drug. It targets fast dividing cells, like cancer cells, and causes these cells to die. This medicine is used to treat cancers of the colon and rectum, and many other cancers. This medicine may be used for other purposes; ask your health care provider or pharmacist if you have questions. COMMON BRAND NAME(S): Eloxatin What should I tell my care team before I take this medication? They need to know if you have any of these conditions: heart disease history of irregular heartbeat liver disease low blood counts, like white cells, platelets, or red blood cells lung or breathing disease, like asthma take medicines that treat or prevent blood clots tingling of the fingers or toes, or other nerve disorder an unusual  or allergic reaction to oxaliplatin, other chemotherapy, other medicines, foods, dyes, or preservatives pregnant or trying to get pregnant breast-feeding How should I use this medication? This drug is given as an infusion into a vein. It is administered in a hospital or clinic by a specially trained health care professional. Talk to your pediatrician regarding the use of this medicine in children. Special care may be needed. Overdosage: If you think you have taken too much of this medicine contact a poison control center or  emergency room at once. NOTE: This medicine is only for you. Do not share this medicine with others. What if I miss a dose? It is important not to miss a dose. Call your doctor or health care professional if you are unable to keep an appointment. What may interact with this medication? Do not take this medicine with any of the following medications: cisapride dronedarone pimozide thioridazine This medicine may also interact with the following medications: aspirin and aspirin-like medicines certain medicines that treat or prevent blood clots like warfarin, apixaban, dabigatran, and rivaroxaban cisplatin cyclosporine diuretics medicines for infection like acyclovir, adefovir, amphotericin B, bacitracin, cidofovir, foscarnet, ganciclovir, gentamicin, pentamidine, vancomycin NSAIDs, medicines for pain and inflammation, like ibuprofen or naproxen other medicines that prolong the QT interval (an abnormal heart rhythm) pamidronate zoledronic acid This list may not describe all possible interactions. Give your health care provider a list of all the medicines, herbs, non-prescription drugs, or dietary supplements you use. Also tell them if you smoke, drink alcohol, or use illegal drugs. Some items may interact with your medicine. What should I watch for while using this medication? Your condition will be monitored carefully while you are receiving this medicine. You may need blood work done while you are taking this medicine. This medicine may make you feel generally unwell. This is not uncommon as chemotherapy can affect healthy cells as well as cancer cells. Report any side effects. Continue your course of treatment even though you feel ill unless your healthcare professional tells you to stop. This medicine can make you more sensitive to cold. Do not drink cold drinks or use ice. Cover exposed skin before coming in contact with cold temperatures or cold objects. When out in cold weather wear warm  clothing and cover your mouth and nose to warm the air that goes into your lungs. Tell your doctor if you get sensitive to the cold. Do not become pregnant while taking this medicine or for 9 months after stopping it. Women should inform their health care professional if they wish to become pregnant or think they might be pregnant. Men should not father a child while taking this medicine and for 6 months after stopping it. There is potential for serious side effects to an unborn child. Talk to your health care professional for more information. Do not breast-feed a child while taking this medicine or for 3 months after stopping it. This medicine has caused ovarian failure in some women. This medicine may make it more difficult to get pregnant. Talk to your health care professional if you are concerned about your fertility. This medicine has caused decreased sperm counts in some men. This may make it more difficult to father a child. Talk to your health care professional if you are concerned about your fertility. This medicine may increase your risk of getting an infection. Call your health care professional for advice if you get a fever, chills, or sore throat, or other symptoms of a cold or flu. Do not  treat yourself. Try to avoid being around people who are sick. Avoid taking medicines that contain aspirin, acetaminophen, ibuprofen, naproxen, or ketoprofen unless instructed by your health care professional. These medicines may hide a fever. Be careful brushing or flossing your teeth or using a toothpick because you may get an infection or bleed more easily. If you have any dental work done, tell your dentist you are receiving this medicine. What side effects may I notice from receiving this medication? Side effects that you should report to your doctor or health care professional as soon as possible: allergic reactions like skin rash, itching or hives, swelling of the face, lips, or tongue breathing  problems cough low blood counts - this medicine may decrease the number of white blood cells, red blood cells, and platelets. You may be at increased risk for infections and bleeding nausea, vomiting pain, redness, or irritation at site where injected pain, tingling, numbness in the hands or feet signs and symptoms of bleeding such as bloody or black, tarry stools; red or dark brown urine; spitting up blood or brown material that looks like coffee grounds; red spots on the skin; unusual bruising or bleeding from the eyes, gums, or nose signs and symptoms of a dangerous change in heartbeat or heart rhythm like chest pain; dizziness; fast, irregular heartbeat; palpitations; feeling faint or lightheaded; falls signs and symptoms of infection like fever; chills; cough; sore throat; pain or trouble passing urine signs and symptoms of liver injury like dark yellow or brown urine; general ill feeling or flu-like symptoms; light-colored stools; loss of appetite; nausea; right upper belly pain; unusually weak or tired; yellowing of the eyes or skin signs and symptoms of low red blood cells or anemia such as unusually weak or tired; feeling faint or lightheaded; falls signs and symptoms of muscle injury like dark urine; trouble passing urine or change in the amount of urine; unusually weak or tired; muscle pain; back pain Side effects that usually do not require medical attention (report to your doctor or health care professional if they continue or are bothersome): changes in taste diarrhea gas hair loss loss of appetite mouth sores This list may not describe all possible side effects. Call your doctor for medical advice about side effects. You may report side effects to FDA at 1-800-FDA-1088. Where should I keep my medication? This drug is given in a hospital or clinic and will not be stored at home. NOTE: This sheet is a summary. It may not cover all possible information. If you have questions about  this medicine, talk to your doctor, pharmacist, or health care provider.  2022 Elsevier/Gold Standard (2018-07-12 12:20:35)  Fluorouracil, 5-FU injection What is this medication? FLUOROURACIL, 5-FU (flure oh YOOR a sil) is a chemotherapy drug. It slows the growth of cancer cells. This medicine is used to treat many types of cancer like breast cancer, colon or rectal cancer, pancreatic cancer, and stomach cancer. This medicine may be used for other purposes; ask your health care provider or pharmacist if you have questions. COMMON BRAND NAME(S): Adrucil What should I tell my care team before I take this medication? They need to know if you have any of these conditions: blood disorders dihydropyrimidine dehydrogenase (DPD) deficiency infection (especially a virus infection such as chickenpox, cold sores, or herpes) kidney disease liver disease malnourished, poor nutrition recent or ongoing radiation therapy an unusual or allergic reaction to fluorouracil, other chemotherapy, other medicines, foods, dyes, or preservatives pregnant or trying to get pregnant breast-feeding  How should I use this medication? This drug is given as an infusion or injection into a vein. It is administered in a hospital or clinic by a specially trained health care professional. Talk to your pediatrician regarding the use of this medicine in children. Special care may be needed. Overdosage: If you think you have taken too much of this medicine contact a poison control center or emergency room at once. NOTE: This medicine is only for you. Do not share this medicine with others. What if I miss a dose? It is important not to miss your dose. Call your doctor or health care professional if you are unable to keep an appointment. What may interact with this medication? Do not take this medicine with any of the following medications: live virus vaccines This medicine may also interact with the following  medications: medicines that treat or prevent blood clots like warfarin, enoxaparin, and dalteparin This list may not describe all possible interactions. Give your health care provider a list of all the medicines, herbs, non-prescription drugs, or dietary supplements you use. Also tell them if you smoke, drink alcohol, or use illegal drugs. Some items may interact with your medicine. What should I watch for while using this medication? Visit your doctor for checks on your progress. This drug may make you feel generally unwell. This is not uncommon, as chemotherapy can affect healthy cells as well as cancer cells. Report any side effects. Continue your course of treatment even though you feel ill unless your doctor tells you to stop. In some cases, you may be given additional medicines to help with side effects. Follow all directions for their use. Call your doctor or health care professional for advice if you get a fever, chills or sore throat, or other symptoms of a cold or flu. Do not treat yourself. This drug decreases your body's ability to fight infections. Try to avoid being around people who are sick. This medicine may increase your risk to bruise or bleed. Call your doctor or health care professional if you notice any unusual bleeding. Be careful brushing and flossing your teeth or using a toothpick because you may get an infection or bleed more easily. If you have any dental work done, tell your dentist you are receiving this medicine. Avoid taking products that contain aspirin, acetaminophen, ibuprofen, naproxen, or ketoprofen unless instructed by your doctor. These medicines may hide a fever. Do not become pregnant while taking this medicine. Women should inform their doctor if they wish to become pregnant or think they might be pregnant. There is a potential for serious side effects to an unborn child. Talk to your health care professional or pharmacist for more information. Do not breast-feed  an infant while taking this medicine. Men should inform their doctor if they wish to father a child. This medicine may lower sperm counts. Do not treat diarrhea with over the counter products. Contact your doctor if you have diarrhea that lasts more than 2 days or if it is severe and watery. This medicine can make you more sensitive to the sun. Keep out of the sun. If you cannot avoid being in the sun, wear protective clothing and use sunscreen. Do not use sun lamps or tanning beds/booths. What side effects may I notice from receiving this medication? Side effects that you should report to your doctor or health care professional as soon as possible: allergic reactions like skin rash, itching or hives, swelling of the face, lips, or tongue low blood counts -  this medicine may decrease the number of white blood cells, red blood cells and platelets. You may be at increased risk for infections and bleeding. signs of infection - fever or chills, cough, sore throat, pain or difficulty passing urine signs of decreased platelets or bleeding - bruising, pinpoint red spots on the skin, black, tarry stools, blood in the urine signs of decreased red blood cells - unusually weak or tired, fainting spells, lightheadedness breathing problems changes in vision chest pain mouth sores nausea and vomiting pain, swelling, redness at site where injected pain, tingling, numbness in the hands or feet redness, swelling, or sores on hands or feet stomach pain unusual bleeding Side effects that usually do not require medical attention (report to your doctor or health care professional if they continue or are bothersome): changes in finger or toe nails diarrhea dry or itchy skin hair loss headache loss of appetite sensitivity of eyes to the light stomach upset unusually teary eyes This list may not describe all possible side effects. Call your doctor for medical advice about side effects. You may report side  effects to FDA at 1-800-FDA-1088. Where should I keep my medication? This drug is given in a hospital or clinic and will not be stored at home. NOTE: This sheet is a summary. It may not cover all possible information. If you have questions about this medicine, talk to your doctor, pharmacist, or health care provider.  2022 Elsevier/Gold Standard (2019-01-23 15:00:03)  The chemotherapy medication bag should finish at 46 hours, 96 hours, or 7 days. For example, if your pump is scheduled for 46 hours and it was put on at 4:00 p.m., it should finish at 2:00 p.m. the day it is scheduled to come off regardless of your appointment time.     Estimated time to finish at 11:00 a.m. on Friday 12/26/2020.   If the display on your pump reads "Low Volume" and it is beeping, take the batteries out of the pump and come to the cancer center for it to be taken off.   If the pump alarms go off prior to the pump reading "Low Volume" then call [phone] and someone can assist you.  If the plunger comes out and the chemotherapy medication is leaking out, please use your home chemo spill kit to clean up the spill. Do NOT use paper towels or other household products.  If you have problems or questions regarding your pump, please call either [phone] (24 hours a day) or the cancer center Monday-Friday 8:00 a.m.- 4:30 p.m. at the clinic number and we will assist you. If you are unable to get assistance, then go to the nearest Emergency Department and ask the staff to contact the IV team for assistance.

## 2020-12-24 NOTE — Progress Notes (Signed)
Patient presents for treatment. RN assessment completed along with the following:  Labs/vitals reviewed - Yes, and within treatment parameters.   Weight within 10% of previous measurement - Yes Oncology Treatment Attestation completed for current therapy- Yes, on date 09/29/2020 Informed consent completed and reflects current therapy/intent - Yes, on date 10/08/2020             Provider progress note reviewed - Yes, today's provider note was reviewed. Treatment/Antibody/Supportive plan reviewed - Yes, and there are no adjustments needed for today's treatment. S&H and other orders reviewed - Yes, and there are no additional orders identified. Previous treatment date reviewed - Yes, and the appropriate amount of time has elapsed between treatments. Clinic Hand Off Received from - Yes, from Merceda Elks, RN.  Patient to proceed with treatment.

## 2020-12-24 NOTE — Progress Notes (Signed)
[facility] OFFICE PROGRESS NOTE   Diagnosis: Rectal cancer  INTERVAL HISTORY:   William Good returns as scheduled.  He completed another cycle of FOLFOX on 12/10/2020.  He reports nausea following chemotherapy.  The nausea was relieved with antiemetics.  He had intermittent diarrhea following chemotherapy.  The diarrhea has resolved.  He has intermittent constipation.  He has persistent cold sensitivity, no peripheral numbness.  No mouth sores.  No pain after G-CSF. William Good developed upper airway congestion and a cough 2-3 weeks ago.  He reports a negative home COVID test.  He saw his primary provider and was placed on azithromycin and a prednisone taper last week.  His symptoms have improved.  He continues smoking.  Objective:  Vital signs in last 24 hours:  Blood pressure 130/82, pulse 75, temperature 97.8 F (36.6 C), temperature source Oral, resp. rate 18, height 6' (1.829 m), weight 227 lb 3.2 oz (103.1 kg), SpO2 96 %.    Resp: Inspiratory/expiratory rhonchi at the right greater than left posterior chest, no respiratory distress Cardio: Distant heart sounds GI: No hepatosplenomegaly Vascular: No leg edema Neuro: Moderate loss of vibratory sense at the fingertips bilaterally   Portacath/PICC-without erythema  Lab Results:  Lab Results  Component Value Date   WBC 11.5 (H) 12/10/2020   HGB 12.4 (L) 12/10/2020   HCT 39.8 12/10/2020   MCV 89.0 12/10/2020   PLT 91 (L) 12/10/2020   NEUTROABS 6.4 12/10/2020    CMP  Lab Results  Component Value Date   NA 139 12/10/2020   K 3.1 (L) 12/10/2020   CL 101 12/10/2020   CO2 28 12/10/2020   GLUCOSE 205 (H) 12/10/2020   BUN 10 12/10/2020   CREATININE 1.30 (H) 12/10/2020   CALCIUM 8.3 (L) 12/10/2020   PROT 5.9 (L) 12/10/2020   ALBUMIN 3.4 (L) 12/10/2020   AST 17 12/10/2020   ALT 13 12/10/2020   ALKPHOS 152 (H) 12/10/2020   BILITOT 0.5 12/10/2020   GFRNONAA >60 12/10/2020   GFRAA >60 01/17/2017     Lab Results  Component Value Date   CEA 48.66 (H) 10/08/2020   Medications: I have reviewed the patient's current medications.   Assessment/Plan: Rectal cancer Mass at 6 cm on digital exam/anoscopy 08/25/2020 Colonoscopy 09/05/2020-mid rectal mass-biopsy, adenocarcinoma, no loss of mismatch repair protein expression CT abdomen/pelvis 08/29/2020-small amount of free fluid in the pelvis, mildly enlarged left external iliac node, cystic changes of the left kidney with hydronephrosis and regions of cortical thinning with transition at the left UPJ suggesting chronic left UPJ obstruction MRI pelvis 09/24/2020-tumor 6 cm from the anal verge, 1.8 cm from the internal anal sphincter, T2, 7 mm lymph node in the left mesorectal sheath, N1 Cycle 1 FOLFOX 10/08/2020 Cycle 2 FOLFOX 10/22/2020 Cycle 3 FOLFOX 11/12/2020 (oxaliplatin dose reduced, white cell growth factor support on day of pump discontinuation) Cycle 4 FOLFOX 11/26/2020, 5-FU bolus and infusion dose reduced secondary to diarrhea, Ziextenzo Cycle 5 FOLFOX 12/10/2020, 5-FU bolus held due to diarrhea, Ziextenzo Cycle 6 FOLFOX 12/24/2020, 5-FU bolus held, Ziextenzo     Diabetes Chronic kidney disease Bipolar disorder Hypertension Hyperlipidemia Restless leg syndrome Multiple colon polyps removed on the colonoscopy 09/05/2020-tubular adenomas including a rectal polyp with high-grade dysplasia-polypectomy margin negative for dysplasia Portacath placement, Dr Zenia Resides, 10/02/2020      Disposition: William Good appears unchanged.  He is recovering from an upper respiratory infection.  He appears stable to proceed with cycle 6 neoadjuvant FOLFOX today.  He has moderate loss  of vibratory sense on exam today.  We will hold oxaliplatin if the vibratory sense loss progresses or he develops peripheral numbness.  He will return for an office visit and chemotherapy in 2 weeks.  Betsy Coder, MD  12/24/2020  8:40 AM

## 2020-12-26 ENCOUNTER — Other Ambulatory Visit: Payer: Self-pay

## 2020-12-26 ENCOUNTER — Inpatient Hospital Stay: Payer: Medicare Other

## 2020-12-26 VITALS — BP 106/78 | HR 59 | Temp 98.6°F | Resp 18

## 2020-12-26 DIAGNOSIS — C20 Malignant neoplasm of rectum: Secondary | ICD-10-CM | POA: Diagnosis not present

## 2020-12-26 DIAGNOSIS — Z5111 Encounter for antineoplastic chemotherapy: Secondary | ICD-10-CM | POA: Diagnosis not present

## 2020-12-26 DIAGNOSIS — Z5189 Encounter for other specified aftercare: Secondary | ICD-10-CM | POA: Diagnosis not present

## 2020-12-26 MED ORDER — HEPARIN SOD (PORK) LOCK FLUSH 100 UNIT/ML IV SOLN
500.0000 [IU] | Freq: Once | INTRAVENOUS | Status: AC | PRN
  Administered 2020-12-26: 500 [IU]

## 2020-12-26 MED ORDER — SODIUM CHLORIDE 0.9% FLUSH
10.0000 mL | INTRAVENOUS | Status: DC | PRN
  Administered 2020-12-26: 10 mL

## 2020-12-26 MED ORDER — PEGFILGRASTIM-BMEZ 6 MG/0.6ML ~~LOC~~ SOSY
6.0000 mg | PREFILLED_SYRINGE | Freq: Once | SUBCUTANEOUS | Status: AC
  Administered 2020-12-26: 6 mg via SUBCUTANEOUS

## 2020-12-26 NOTE — Patient Instructions (Signed)
[facility]  Discharge Instructions: Thank you for choosing [facility] to provide your oncology and hematology care.   If you have a lab appointment with the [facility], please go directly to the [facility] and check in at the registration area.   Wear comfortable clothing and clothing appropriate for easy access to any Portacath or PICC line.   We strive to give you quality time with your provider. You may need to reschedule your appointment if you arrive late (15 or more minutes).  Arriving late affects you and other patients whose appointments are after yours.  Also, if you miss three or more appointments without notifying the office, you may be dismissed from the clinic at the provider's discretion.      For prescription refill requests, have your pharmacy contact our office and allow 72 hours for refills to be completed.    Today you received the following chemotherapy and/or immunotherapy agents Ziextenzo      To help prevent nausea and vomiting after your treatment, we encourage you to take your nausea medication as directed.  BELOW ARE SYMPTOMS THAT SHOULD BE REPORTED IMMEDIATELY: *FEVER GREATER THAN 100.4 F (38 C) OR HIGHER *CHILLS OR SWEATING *NAUSEA AND VOMITING THAT IS NOT CONTROLLED WITH YOUR NAUSEA MEDICATION *UNUSUAL SHORTNESS OF BREATH *UNUSUAL BRUISING OR BLEEDING *URINARY PROBLEMS (pain or burning when urinating, or frequent urination) *BOWEL PROBLEMS (unusual diarrhea, constipation, pain near the anus) TENDERNESS IN MOUTH AND THROAT WITH OR WITHOUT PRESENCE OF ULCERS (sore throat, sores in mouth, or a toothache) UNUSUAL RASH, SWELLING OR PAIN  UNUSUAL VAGINAL DISCHARGE OR ITCHING   Items with * indicate a potential emergency and should be followed up as soon as possible or go to the Emergency Department if any problems should occur.  Please show the CHEMOTHERAPY ALERT CARD or IMMUNOTHERAPY ALERT CARD at check-in to the  Emergency Department and triage nurse.  Should you have questions after your visit or need to cancel or reschedule your appointment, please contact [facility]  Dept: [phone]  and follow the prompts.  Office hours are 8:00 a.m. to 4:30 p.m. Monday - Friday. Please note that voicemails left after 4:00 p.m. may not be returned until the following business day.  We are closed weekends and major holidays. You have access to a nurse at all times for urgent questions. Please call the main number to the clinic Dept: [phone] and follow the prompts.   For any non-urgent questions, you may also contact your provider using MyChart. We now offer e-Visits for anyone 12 and older to request care online for non-urgent symptoms. For details visit [email or web address].   Also download the MyChart app! Go to the app store, search "MyChart", open the app, select [facility], and log in with your MyChart username and password.  Due to Covid, a mask is required upon entering the hospital/clinic. If you do not have a mask, one will be given to you upon arrival. For doctor visits, patients may have 1 support person aged 107 or older with them. For treatment visits, patients cannot have anyone with them due to current Covid guidelines and our immunocompromised population.   Pegfilgrastim injection What is this medication? PEGFILGRASTIM (PEG fil gra stim) is a long-acting granulocyte colony-stimulating factor that stimulates the growth of neutrophils, a type of white blood cell important in the body's fight against infection. It is used to reduce the incidence of fever and infection in patients  with certain types of cancer who are receiving chemotherapy that affects the bone marrow, and to increase survival after being exposed to high doses of radiation. This medicine may be used for other purposes; ask your health care provider or pharmacist if you have questions. COMMON BRAND NAME(S):  Fulphila, Neulasta, Nyvepria, UDENYCA, Ziextenzo What should I tell my care team before I take this medication? They need to know if you have any of these conditions: kidney disease latex allergy ongoing radiation therapy sickle cell disease skin reactions to acrylic adhesives (On-Body Injector only) an unusual or allergic reaction to pegfilgrastim, filgrastim, other medicines, foods, dyes, or preservatives pregnant or trying to get pregnant breast-feeding How should I use this medication? This medicine is for injection under the skin. If you get this medicine at home, you will be taught how to prepare and give the pre-filled syringe or how to use the On-body Injector. Refer to the patient Instructions for Use for detailed instructions. Use exactly as directed. Tell your healthcare provider immediately if you suspect that the On-body Injector may not have performed as intended or if you suspect the use of the On-body Injector resulted in a missed or partial dose. It is important that you put your used needles and syringes in a special sharps container. Do not put them in a trash can. If you do not have a sharps container, call your pharmacist or healthcare provider to get one. Talk to your pediatrician regarding the use of this medicine in children. While this drug may be prescribed for selected conditions, precautions do apply. Overdosage: If you think you have taken too much of this medicine contact a poison control center or emergency room at once. NOTE: This medicine is only for you. Do not share this medicine with others. What if I miss a dose? It is important not to miss your dose. Call your doctor or health care professional if you miss your dose. If you miss a dose due to an On-body Injector failure or leakage, a new dose should be administered as soon as possible using a single prefilled syringe for manual use. What may interact with this medication? Interactions have not been  studied. This list may not describe all possible interactions. Give your health care provider a list of all the medicines, herbs, non-prescription drugs, or dietary supplements you use. Also tell them if you smoke, drink alcohol, or use illegal drugs. Some items may interact with your medicine. What should I watch for while using this medication? Your condition will be monitored carefully while you are receiving this medicine. You may need blood work done while you are taking this medicine. Talk to your health care provider about your risk of cancer. You may be more at risk for certain types of cancer if you take this medicine. If you are going to need a MRI, CT scan, or other procedure, tell your doctor that you are using this medicine (On-Body Injector only). What side effects may I notice from receiving this medication? Side effects that you should report to your doctor or health care professional as soon as possible: allergic reactions (skin rash, itching or hives, swelling of the face, lips, or tongue) back pain dizziness fever pain, redness, or irritation at site where injected pinpoint red spots on the skin red or dark-brown urine shortness of breath or breathing problems stomach or side pain, or pain at the shoulder swelling tiredness trouble passing urine or change in the amount of urine unusual bruising or bleeding   Side effects that usually do not require medical attention (report to your doctor or health care professional if they continue or are bothersome): bone pain muscle pain This list may not describe all possible side effects. Call your doctor for medical advice about side effects. You may report side effects to FDA at 1-800-FDA-1088. Where should I keep my medication? Keep out of the reach of children. If you are using this medicine at home, you will be instructed on how to store it. Throw away any unused medicine after the expiration date on the label. NOTE: This sheet  is a summary. It may not cover all possible information. If you have questions about this medicine, talk to your doctor, pharmacist, or health care provider.  2022 Elsevier/Gold Standard (2020-03-21 11:54:14)

## 2020-12-29 ENCOUNTER — Telehealth: Payer: Self-pay

## 2020-12-29 NOTE — Telephone Encounter (Signed)
Return call following up on Access Nurse call from the weekend. Spoke with Pt's brother who called about Pt having abdominal pain, diarrhea, and generalized weakness. Pt's brother stated Pt was doing better after they pushed fluids and drinking ensure. Informed Pt's brother to give a return call if anything should change.

## 2020-12-30 ENCOUNTER — Other Ambulatory Visit: Payer: Self-pay | Admitting: Oncology

## 2020-12-31 ENCOUNTER — Other Ambulatory Visit: Payer: Self-pay

## 2020-12-31 ENCOUNTER — Other Ambulatory Visit: Payer: Self-pay | Admitting: Nurse Practitioner

## 2020-12-31 DIAGNOSIS — C189 Malignant neoplasm of colon, unspecified: Secondary | ICD-10-CM | POA: Diagnosis not present

## 2020-12-31 DIAGNOSIS — E1159 Type 2 diabetes mellitus with other circulatory complications: Secondary | ICD-10-CM | POA: Diagnosis not present

## 2020-12-31 DIAGNOSIS — C20 Malignant neoplasm of rectum: Secondary | ICD-10-CM

## 2020-12-31 DIAGNOSIS — N183 Chronic kidney disease, stage 3 unspecified: Secondary | ICD-10-CM | POA: Diagnosis not present

## 2020-12-31 DIAGNOSIS — I10 Essential (primary) hypertension: Secondary | ICD-10-CM | POA: Diagnosis not present

## 2020-12-31 DIAGNOSIS — G43009 Migraine without aura, not intractable, without status migrainosus: Secondary | ICD-10-CM | POA: Diagnosis not present

## 2020-12-31 DIAGNOSIS — K219 Gastro-esophageal reflux disease without esophagitis: Secondary | ICD-10-CM | POA: Diagnosis not present

## 2020-12-31 DIAGNOSIS — E785 Hyperlipidemia, unspecified: Secondary | ICD-10-CM | POA: Diagnosis not present

## 2020-12-31 MED ORDER — HYDROCODONE-ACETAMINOPHEN 5-325 MG PO TABS: 0.5000 | ORAL_TABLET | Freq: Two times a day (BID) | ORAL | 0 refills | Status: DC | PRN

## 2021-01-04 ENCOUNTER — Other Ambulatory Visit: Payer: Self-pay | Admitting: Oncology

## 2021-01-07 ENCOUNTER — Other Ambulatory Visit: Payer: Self-pay

## 2021-01-07 ENCOUNTER — Inpatient Hospital Stay: Payer: Medicare Other

## 2021-01-07 ENCOUNTER — Encounter: Payer: Self-pay | Admitting: Nurse Practitioner

## 2021-01-07 ENCOUNTER — Inpatient Hospital Stay: Payer: Medicare Other | Admitting: Nurse Practitioner

## 2021-01-07 VITALS — BP 99/63 | HR 76 | Temp 97.8°F | Resp 18 | Ht 72.0 in | Wt 225.6 lb

## 2021-01-07 DIAGNOSIS — E1122 Type 2 diabetes mellitus with diabetic chronic kidney disease: Secondary | ICD-10-CM | POA: Insufficient documentation

## 2021-01-07 DIAGNOSIS — C20 Malignant neoplasm of rectum: Secondary | ICD-10-CM

## 2021-01-07 DIAGNOSIS — G2581 Restless legs syndrome: Secondary | ICD-10-CM | POA: Insufficient documentation

## 2021-01-07 DIAGNOSIS — G629 Polyneuropathy, unspecified: Secondary | ICD-10-CM | POA: Insufficient documentation

## 2021-01-07 DIAGNOSIS — I129 Hypertensive chronic kidney disease with stage 1 through stage 4 chronic kidney disease, or unspecified chronic kidney disease: Secondary | ICD-10-CM | POA: Insufficient documentation

## 2021-01-07 DIAGNOSIS — F319 Bipolar disorder, unspecified: Secondary | ICD-10-CM | POA: Insufficient documentation

## 2021-01-07 DIAGNOSIS — Z9221 Personal history of antineoplastic chemotherapy: Secondary | ICD-10-CM | POA: Insufficient documentation

## 2021-01-07 DIAGNOSIS — K635 Polyp of colon: Secondary | ICD-10-CM | POA: Insufficient documentation

## 2021-01-07 DIAGNOSIS — N189 Chronic kidney disease, unspecified: Secondary | ICD-10-CM | POA: Insufficient documentation

## 2021-01-07 DIAGNOSIS — R110 Nausea: Secondary | ICD-10-CM | POA: Insufficient documentation

## 2021-01-07 DIAGNOSIS — Z510 Encounter for antineoplastic radiation therapy: Secondary | ICD-10-CM | POA: Insufficient documentation

## 2021-01-07 DIAGNOSIS — Z5111 Encounter for antineoplastic chemotherapy: Secondary | ICD-10-CM | POA: Insufficient documentation

## 2021-01-07 DIAGNOSIS — E785 Hyperlipidemia, unspecified: Secondary | ICD-10-CM | POA: Insufficient documentation

## 2021-01-07 LAB — CMP (CANCER CENTER ONLY)
ALT: 31 U/L (ref 0–44)
AST: 31 U/L (ref 15–41)
Albumin: 3.5 g/dL (ref 3.5–5.0)
Alkaline Phosphatase: 175 U/L — ABNORMAL HIGH (ref 38–126)
Anion gap: 7 (ref 5–15)
BUN: 12 mg/dL (ref 8–23)
CO2: 30 mmol/L (ref 22–32)
Calcium: 8.6 mg/dL — ABNORMAL LOW (ref 8.9–10.3)
Chloride: 102 mmol/L (ref 98–111)
Creatinine: 1.1 mg/dL (ref 0.61–1.24)
GFR, Estimated: >60 mL/min (ref >60)
Glucose, Bld: 117 mg/dL — ABNORMAL HIGH (ref 70–99)
Potassium: 3.6 mmol/L (ref 3.5–5.1)
Sodium: 139 mmol/L (ref 135–145)
Total Bilirubin: 0.5 mg/dL (ref 0.3–1.2)
Total Protein: 6.3 g/dL — ABNORMAL LOW (ref 6.5–8.1)

## 2021-01-07 LAB — CBC WITH DIFFERENTIAL (CANCER CENTER ONLY)
Abs Immature Granulocytes: 0.13 10*3/uL — ABNORMAL HIGH (ref 0.00–0.07)
Basophils Absolute: 0.1 10*3/uL (ref 0.0–0.1)
Basophils Relative: 0 %
Eosinophils Absolute: 0.1 10*3/uL (ref 0.0–0.5)
Eosinophils Relative: 1 %
HCT: 38.3 % — ABNORMAL LOW (ref 39.0–52.0)
Hemoglobin: 11.8 g/dL — ABNORMAL LOW (ref 13.0–17.0)
Immature Granulocytes: 1 %
Lymphocytes Relative: 27 %
Lymphs Abs: 3.4 10*3/uL (ref 0.7–4.0)
MCH: 28 pg (ref 26.0–34.0)
MCHC: 30.8 g/dL (ref 30.0–36.0)
MCV: 91 fL (ref 80.0–100.0)
Monocytes Absolute: 1 10*3/uL (ref 0.1–1.0)
Monocytes Relative: 7 %
Neutro Abs: 8.2 10*3/uL — ABNORMAL HIGH (ref 1.7–7.7)
Neutrophils Relative %: 64 %
Platelet Count: 111 10*3/uL — ABNORMAL LOW (ref 150–400)
RBC: 4.21 MIL/uL — ABNORMAL LOW (ref 4.22–5.81)
RDW: 19.8 % — ABNORMAL HIGH (ref 11.5–15.5)
WBC Count: 12.8 10*3/uL — ABNORMAL HIGH (ref 4.0–10.5)
nRBC: 0 % (ref 0.0–0.2)

## 2021-01-07 LAB — CEA (ACCESS): CEA (CHCC): 8.8 ng/mL — ABNORMAL HIGH (ref 0.00–5.00)

## 2021-01-07 MED ORDER — SODIUM CHLORIDE 0.9 % IV SOLN
5000.0000 mg | INTRAVENOUS | Status: DC
  Administered 2021-01-07: 5000 mg via INTRAVENOUS
  Filled 2021-01-07: qty 100

## 2021-01-07 NOTE — Progress Notes (Signed)
Patient presents for treatment. RN assessment completed along with the following:  Labs/vitals reviewed - Yes, and within treatment parameters.   Weight within 10% of previous measurement - Yes Oncology Treatment Attestation completed for current therapy- Yes, on date 09/29/2020 Informed consent completed and reflects current therapy/intent - Yes, on date 10/08/2020             Provider progress note reviewed - Yes, today's provider note was reviewed. Treatment/Antibody/Supportive plan reviewed - Yes, Hold Oxaliplatin and Leucovorin. S&H and other orders reviewed - Yes, and there are no additional orders identified. Previous treatment date reviewed - Yes, and the appropriate amount of time has elapsed between treatments. Clinic Hand Off Received from - Yes, Lattie Haw, NP  Patient to proceed with treatment.

## 2021-01-07 NOTE — Progress Notes (Signed)
[facility] Good OFFICE PROGRESS NOTE   Diagnosis: Rectal cancer  INTERVAL HISTORY:   William Good returns as scheduled.  He completed cycle 6 neoadjuvant FOLFOX 12/24/2020.  He had mild nausea.  No vomiting.  No mouth sores.  He developed diarrhea the day after the pump was discontinued.  He took 3 Imodium tablets a day for 3 days with effective control.  He has developed numbness/tingling in the hands and feet.  No hand or foot pain or redness.  He struck the inner portion of the left great toe/foot on an object.  He notes "throbbing" discomfort.  Objective:  Vital signs in last 24 hours:  Blood pressure 99/63, pulse 76, temperature 97.8 F (36.6 C), temperature source Oral, resp. rate 18, height 6' (1.829 m), weight 225 lb 9.6 oz (102.3 kg), SpO2 98 %.    HEENT: No thrush or ulcers. Resp: Lungs clear bilaterally. Cardio: Regular rate and rhythm. GI: Abdomen soft and nontender.  No hepatomegaly. Vascular: No leg edema. Neuro: Vibratory sense moderately decreased over the fingertips per tuning fork exam. Musculoskeletal: Mild erythema/tenderness medial MTP, no edema. Skin: Palms without erythema. Port-A-Cath without erythema.   Lab Results:  Lab Results  Component Value Date   WBC 12.8 (H) 01/07/2021   HGB 11.8 (L) 01/07/2021   HCT 38.3 (L) 01/07/2021   MCV 91.0 01/07/2021   PLT 111 (L) 01/07/2021   NEUTROABS 8.2 (H) 01/07/2021    Imaging:  No results found.  Medications: I have reviewed the patient's current medications.  Assessment/Plan: Rectal cancer Mass at 6 cm on digital exam/anoscopy 08/25/2020 Colonoscopy 09/05/2020-mid rectal mass-biopsy, adenocarcinoma, no loss of mismatch repair protein expression CT abdomen/pelvis 08/29/2020-small amount of free fluid in the pelvis, mildly enlarged left external iliac node, cystic changes of the left kidney with hydronephrosis and regions of cortical thinning with transition at the left UPJ suggesting chronic  left UPJ obstruction MRI pelvis 09/24/2020-tumor 6 cm from the anal verge, 1.8 cm from the internal anal sphincter, T2, 7 mm lymph node in the left mesorectal sheath, N1 Cycle 1 FOLFOX 10/08/2020 Cycle 2 FOLFOX 10/22/2020 Cycle 3 FOLFOX 11/12/2020 (oxaliplatin dose reduced, white cell growth factor support on day of pump discontinuation) Cycle 4 FOLFOX 11/26/2020, 5-FU bolus and infusion dose reduced secondary to diarrhea, Ziextenzo Cycle 5 FOLFOX 12/10/2020, 5-FU bolus held due to diarrhea, Ziextenzo Cycle 6 FOLFOX 12/24/2020, 5-FU bolus held, Ziextenzo Cycle 7 FOLFOX 01/07/2021, oxaliplatin held due to neuropathy, 5-FU bolus held, Ziextenzo held     Diabetes Chronic kidney disease Bipolar disorder Hypertension Hyperlipidemia Restless leg syndrome Multiple colon polyps removed on the colonoscopy 09/05/2020-tubular adenomas including a rectal polyp with high-grade dysplasia-polypectomy margin negative for dysplasia Portacath placement, Dr Zenia Resides, 10/02/2020  Disposition: William Good appears stable.  He has completed 6 cycles of FOLFOX.  Plan to proceed with cycle 7 today as scheduled he has developed progressive neuropathy symptoms.  We are holding oxaliplatin today.  He will not receive Ziextenzo with this cycle.  Continue to hold 5-FU bolus.  He will contact the office with poorly controlled diarrhea.  CBC and chemistry panel reviewed.  Labs adequate to proceed with treatment.  Blood pressure is lower than typical baseline.  He has lost weight over the course of treatment.  He will place Lotensin on hold.  He will return for lab, follow-up, cycle 8 FOLFOX in 2 weeks.  He will contact the office in the interim as outlined above or with any other problems.    William Good  ANP/GNP-BC   01/07/2021  10:22 AM

## 2021-01-07 NOTE — Patient Instructions (Signed)
[facility]   Discharge Instructions: Thank you for choosing [facility] to provide your oncology and hematology care.   If you have a lab appointment with the [facility], please go directly to the [facility] and check in at the registration area.   Wear comfortable clothing and clothing appropriate for easy access to any Portacath or PICC line.   We strive to give you quality time with your provider. You may need to reschedule your appointment if you arrive late (15 or more minutes).  Arriving late affects you and other patients whose appointments are after yours.  Also, if you miss three or more appointments without notifying the office, you may be dismissed from the clinic at the provider's discretion.      For prescription refill requests, have your pharmacy contact our office and allow 72 hours for refills to be completed.    Today you received the following chemotherapy and/or immunotherapy agents Flourouracil (ADRUCIL).      To help prevent nausea and vomiting after your treatment, we encourage you to take your nausea medication as directed.  BELOW ARE SYMPTOMS THAT SHOULD BE REPORTED IMMEDIATELY: *FEVER GREATER THAN 100.4 F (38 C) OR HIGHER *CHILLS OR SWEATING *NAUSEA AND VOMITING THAT IS NOT CONTROLLED WITH YOUR NAUSEA MEDICATION *UNUSUAL SHORTNESS OF BREATH *UNUSUAL BRUISING OR BLEEDING *URINARY PROBLEMS (pain or burning when urinating, or frequent urination) *BOWEL PROBLEMS (unusual diarrhea, constipation, pain near the anus) TENDERNESS IN MOUTH AND THROAT WITH OR WITHOUT PRESENCE OF ULCERS (sore throat, sores in mouth, or a toothache) UNUSUAL RASH, SWELLING OR PAIN  UNUSUAL VAGINAL DISCHARGE OR ITCHING   Items with * indicate a potential emergency and should be followed up as soon as possible or go to the Emergency Department if any problems should occur.  Please show the CHEMOTHERAPY ALERT CARD or IMMUNOTHERAPY ALERT CARD at  check-in to the Emergency Department and triage nurse.  Should you have questions after your visit or need to cancel or reschedule your appointment, please contact [facility]  Dept: [phone]  and follow the prompts.  Office hours are 8:00 a.m. to 4:30 p.m. Monday - Friday. Please note that voicemails left after 4:00 p.m. may not be returned until the following business day.  We are closed weekends and major holidays. You have access to a nurse at all times for urgent questions. Please call the main number to the clinic Dept: [phone] and follow the prompts.   For any non-urgent questions, you may also contact your provider using MyChart. We now offer e-Visits for anyone 62 and older to request care online for non-urgent symptoms. For details visit [email or web address].   Also download the MyChart app! Go to the app store, search "MyChart", open the app, select [facility], and log in with your MyChart username and password.  Due to Covid, a mask is required upon entering the hospital/clinic. If you do not have a mask, one will be given to you upon arrival. For doctor visits, patients may have 1 support person aged 65 or older with them. For treatment visits, patients cannot have anyone with them due to current Covid guidelines and our immunocompromised population.   Fluorouracil, 5-FU injection What is this medication? FLUOROURACIL, 5-FU (flure oh YOOR a sil) is a chemotherapy drug. It slows the growth of cancer cells. This medicine is used to treat many types of cancer like breast cancer, colon or rectal cancer, pancreatic cancer, and stomach cancer.  This medicine may be used for other purposes; ask your health care provider or pharmacist if you have questions. COMMON BRAND NAME(S): Adrucil What should I tell my care team before I take this medication? They need to know if you have any of these conditions: blood disorders dihydropyrimidine dehydrogenase  (DPD) deficiency infection (especially a virus infection such as chickenpox, cold sores, or herpes) kidney disease liver disease malnourished, poor nutrition recent or ongoing radiation therapy an unusual or allergic reaction to fluorouracil, other chemotherapy, other medicines, foods, dyes, or preservatives pregnant or trying to get pregnant breast-feeding How should I use this medication? This drug is given as an infusion or injection into a vein. It is administered in a hospital or clinic by a specially trained health care professional. Talk to your pediatrician regarding the use of this medicine in children. Special care may be needed. Overdosage: If you think you have taken too much of this medicine contact a poison control center or emergency room at once. NOTE: This medicine is only for you. Do not share this medicine with others. What if I miss a dose? It is important not to miss your dose. Call your doctor or health care professional if you are unable to keep an appointment. What may interact with this medication? Do not take this medicine with any of the following medications: live virus vaccines This medicine may also interact with the following medications: medicines that treat or prevent blood clots like warfarin, enoxaparin, and dalteparin This list may not describe all possible interactions. Give your health care provider a list of all the medicines, herbs, non-prescription drugs, or dietary supplements you use. Also tell them if you smoke, drink alcohol, or use illegal drugs. Some items may interact with your medicine. What should I watch for while using this medication? Visit your doctor for checks on your progress. This drug may make you feel generally unwell. This is not uncommon, as chemotherapy can affect healthy cells as well as cancer cells. Report any side effects. Continue your course of treatment even though you feel ill unless your doctor tells you to stop. In some  cases, you may be given additional medicines to help with side effects. Follow all directions for their use. Call your doctor or health care professional for advice if you get a fever, chills or sore throat, or other symptoms of a cold or flu. Do not treat yourself. This drug decreases your body's ability to fight infections. Try to avoid being around people who are sick. This medicine may increase your risk to bruise or bleed. Call your doctor or health care professional if you notice any unusual bleeding. Be careful brushing and flossing your teeth or using a toothpick because you may get an infection or bleed more easily. If you have any dental work done, tell your dentist you are receiving this medicine. Avoid taking products that contain aspirin, acetaminophen, ibuprofen, naproxen, or ketoprofen unless instructed by your doctor. These medicines may hide a fever. Do not become pregnant while taking this medicine. Women should inform their doctor if they wish to become pregnant or think they might be pregnant. There is a potential for serious side effects to an unborn child. Talk to your health care professional or pharmacist for more information. Do not breast-feed an infant while taking this medicine. Men should inform their doctor if they wish to father a child. This medicine may lower sperm counts. Do not treat diarrhea with over the counter products. Contact your doctor if  you have diarrhea that lasts more than 2 days or if it is severe and watery. This medicine can make you more sensitive to the sun. Keep out of the sun. If you cannot avoid being in the sun, wear protective clothing and use sunscreen. Do not use sun lamps or tanning beds/booths. What side effects may I notice from receiving this medication? Side effects that you should report to your doctor or health care professional as soon as possible: allergic reactions like skin rash, itching or hives, swelling of the face, lips, or  tongue low blood counts - this medicine may decrease the number of white blood cells, red blood cells and platelets. You may be at increased risk for infections and bleeding. signs of infection - fever or chills, cough, sore throat, pain or difficulty passing urine signs of decreased platelets or bleeding - bruising, pinpoint red spots on the skin, black, tarry stools, blood in the urine signs of decreased red blood cells - unusually weak or tired, fainting spells, lightheadedness breathing problems changes in vision chest pain mouth sores nausea and vomiting pain, swelling, redness at site where injected pain, tingling, numbness in the hands or feet redness, swelling, or sores on hands or feet stomach pain unusual bleeding Side effects that usually do not require medical attention (report to your doctor or health care professional if they continue or are bothersome): changes in finger or toe nails diarrhea dry or itchy skin hair loss headache loss of appetite sensitivity of eyes to the light stomach upset unusually teary eyes This list may not describe all possible side effects. Call your doctor for medical advice about side effects. You may report side effects to FDA at 1-800-FDA-1088. Where should I keep my medication? This drug is given in a hospital or clinic and will not be stored at home. NOTE: This sheet is a summary. It may not cover all possible information. If you have questions about this medicine, talk to your doctor, pharmacist, or health care provider.  2022 Elsevier/Gold Standard (2019-01-23 15:00:03)  The chemotherapy medication bag should finish at 46 hours, 96 hours, or 7 days. For example, if your pump is scheduled for 46 hours and it was put on at 4:00 p.m., it should finish at 2:00 p.m. the day it is scheduled to come off regardless of your appointment time.     Estimated time to finish at 10:30 a.m. on Friday 01/09/2021.   If the display on your pump reads  "Low Volume" and it is beeping, take the batteries out of the pump and come to the cancer center for it to be taken off.   If the pump alarms go off prior to the pump reading "Low Volume" then call [phone] and someone can assist you.  If the plunger comes out and the chemotherapy medication is leaking out, please use your home chemo spill kit to clean up the spill. Do NOT use paper towels or other household products.  If you have problems or questions regarding your pump, please call either [phone] (24 hours a day) or the cancer center Monday-Friday 8:00 a.m.- 4:30 p.m. at the clinic number and we will assist you. If you are unable to get assistance, then go to the nearest Emergency Department and ask the staff to contact the IV team for assistance.

## 2021-01-08 DIAGNOSIS — C20 Malignant neoplasm of rectum: Secondary | ICD-10-CM | POA: Diagnosis not present

## 2021-01-09 ENCOUNTER — Inpatient Hospital Stay: Payer: Medicare Other

## 2021-01-09 ENCOUNTER — Other Ambulatory Visit: Payer: Self-pay

## 2021-01-09 VITALS — BP 100/81 | HR 73 | Temp 98.5°F | Resp 18

## 2021-01-09 DIAGNOSIS — C20 Malignant neoplasm of rectum: Secondary | ICD-10-CM | POA: Diagnosis not present

## 2021-01-09 DIAGNOSIS — Z9221 Personal history of antineoplastic chemotherapy: Secondary | ICD-10-CM | POA: Diagnosis not present

## 2021-01-09 DIAGNOSIS — Z510 Encounter for antineoplastic radiation therapy: Secondary | ICD-10-CM | POA: Diagnosis not present

## 2021-01-09 DIAGNOSIS — G629 Polyneuropathy, unspecified: Secondary | ICD-10-CM | POA: Diagnosis not present

## 2021-01-09 MED ORDER — SODIUM CHLORIDE 0.9% FLUSH
10.0000 mL | INTRAVENOUS | Status: DC | PRN
  Administered 2021-01-09: 10 mL

## 2021-01-09 MED ORDER — HEPARIN SOD (PORK) LOCK FLUSH 100 UNIT/ML IV SOLN
500.0000 [IU] | Freq: Once | INTRAVENOUS | Status: AC | PRN
  Administered 2021-01-09: 500 [IU]

## 2021-01-15 ENCOUNTER — Ambulatory Visit: Payer: Medicare Other

## 2021-01-15 ENCOUNTER — Ambulatory Visit: Payer: Medicare Other | Admitting: Radiation Oncology

## 2021-01-18 ENCOUNTER — Other Ambulatory Visit: Payer: Self-pay | Admitting: Oncology

## 2021-01-21 ENCOUNTER — Other Ambulatory Visit: Payer: Self-pay

## 2021-01-21 ENCOUNTER — Inpatient Hospital Stay: Payer: Medicare Other

## 2021-01-21 ENCOUNTER — Encounter: Payer: Self-pay | Admitting: Nurse Practitioner

## 2021-01-21 ENCOUNTER — Inpatient Hospital Stay: Payer: Medicare Other | Admitting: Nurse Practitioner

## 2021-01-21 ENCOUNTER — Inpatient Hospital Stay: Payer: Medicare Other | Admitting: Nutrition

## 2021-01-21 VITALS — BP 98/69 | HR 78 | Temp 97.8°F | Resp 18 | Ht 72.0 in | Wt 224.6 lb

## 2021-01-21 DIAGNOSIS — Z510 Encounter for antineoplastic radiation therapy: Secondary | ICD-10-CM | POA: Diagnosis not present

## 2021-01-21 DIAGNOSIS — C20 Malignant neoplasm of rectum: Secondary | ICD-10-CM

## 2021-01-21 DIAGNOSIS — Z9221 Personal history of antineoplastic chemotherapy: Secondary | ICD-10-CM | POA: Diagnosis not present

## 2021-01-21 DIAGNOSIS — G629 Polyneuropathy, unspecified: Secondary | ICD-10-CM | POA: Diagnosis not present

## 2021-01-21 LAB — CMP (CANCER CENTER ONLY)
ALT: 12 U/L (ref 0–44)
AST: 20 U/L (ref 15–41)
Albumin: 3.6 g/dL (ref 3.5–5.0)
Alkaline Phosphatase: 116 U/L (ref 38–126)
Anion gap: 6 (ref 5–15)
BUN: 9 mg/dL (ref 8–23)
CO2: 32 mmol/L (ref 22–32)
Calcium: 9 mg/dL (ref 8.9–10.3)
Chloride: 103 mmol/L (ref 98–111)
Creatinine: 1.02 mg/dL (ref 0.61–1.24)
GFR, Estimated: >60 mL/min (ref >60)
Glucose, Bld: 142 mg/dL — ABNORMAL HIGH (ref 70–99)
Potassium: 3.6 mmol/L (ref 3.5–5.1)
Sodium: 141 mmol/L (ref 135–145)
Total Bilirubin: 0.5 mg/dL (ref 0.3–1.2)
Total Protein: 6.3 g/dL — ABNORMAL LOW (ref 6.5–8.1)

## 2021-01-21 LAB — CBC WITH DIFFERENTIAL (CANCER CENTER ONLY)
Abs Immature Granulocytes: 0.02 10*3/uL (ref 0.00–0.07)
Basophils Absolute: 0 10*3/uL (ref 0.0–0.1)
Basophils Relative: 0 %
Eosinophils Absolute: 0.1 10*3/uL (ref 0.0–0.5)
Eosinophils Relative: 1 %
HCT: 39.8 % (ref 39.0–52.0)
Hemoglobin: 12.3 g/dL — ABNORMAL LOW (ref 13.0–17.0)
Immature Granulocytes: 0 %
Lymphocytes Relative: 25 %
Lymphs Abs: 2.8 10*3/uL (ref 0.7–4.0)
MCH: 28.7 pg (ref 26.0–34.0)
MCHC: 30.9 g/dL (ref 30.0–36.0)
MCV: 93 fL (ref 80.0–100.0)
Monocytes Absolute: 1.1 10*3/uL — ABNORMAL HIGH (ref 0.1–1.0)
Monocytes Relative: 10 %
Neutro Abs: 7.3 10*3/uL (ref 1.7–7.7)
Neutrophils Relative %: 64 %
Platelet Count: 163 10*3/uL (ref 150–400)
RBC: 4.28 MIL/uL (ref 4.22–5.81)
RDW: 20.4 % — ABNORMAL HIGH (ref 11.5–15.5)
WBC Count: 11.4 10*3/uL — ABNORMAL HIGH (ref 4.0–10.5)
nRBC: 0 % (ref 0.0–0.2)

## 2021-01-21 MED ORDER — SODIUM CHLORIDE 0.9 % IV SOLN
5000.0000 mg | INTRAVENOUS | Status: DC
  Administered 2021-01-21: 5000 mg via INTRAVENOUS
  Filled 2021-01-21: qty 100

## 2021-01-21 NOTE — Patient Instructions (Signed)
You have been given two hand outs (1 for you and 2nd for your brother) regarding the chemotherapy pill capecitabine that you will take twice daily on Monday through Friday (days of radiation). Prior to beginning, our pharmacist will call you to go over dosing and side effects.

## 2021-01-21 NOTE — Patient Instructions (Signed)
The chemotherapy medication bag should finish at 46 hours, 96 hours, or 7 days. For example, if your pump is scheduled for 46 hours and it was put on at 4:00 p.m., it should finish at 2:00 p.m. the day it is scheduled to come off regardless of your appointment time.     Estimated time to finish at Friday, November 18 at 9:30 am.   If the display on your pump reads "Low Volume" and it is beeping, take the batteries out of the pump and come to the cancer center for it to be taken off.   If the pump alarms go off prior to the pump reading "Low Volume" then call [phone] and someone can assist you.  If the plunger comes out and the chemotherapy medication is leaking out, please use your home chemo spill kit to clean up the spill. Do NOT use paper towels or other household products.  If you have problems or questions regarding your pump, please call either [phone] (24 hours a day) or the cancer center Monday-Friday 8:00 a.m.- 4:30 p.m. at the clinic number and we will assist you. If you are unable to get assistance, then go to the nearest Emergency Department and ask the staff to contact the IV team for assistance.  [facility]   Discharge Instructions: Thank you for choosing [facility] to provide your oncology and hematology care.   If you have a lab appointment with the [facility], please go directly to the [facility] and check in at the registration area.   Wear comfortable clothing and clothing appropriate for easy access to any Portacath or PICC line.   We strive to give you quality time with your provider. You may need to reschedule your appointment if you arrive late (15 or more minutes).  Arriving late affects you and other patients whose appointments are after yours.  Also, if you miss three or more appointments without notifying the office, you may be dismissed from the clinic at the provider's discretion.      For prescription  refill requests, have your pharmacy contact our office and allow 72 hours for refills to be completed.    Today you received the following chemotherapy and/or immunotherapy agents fluorouracil      To help prevent nausea and vomiting after your treatment, we encourage you to take your nausea medication as directed.  BELOW ARE SYMPTOMS THAT SHOULD BE REPORTED IMMEDIATELY: *FEVER GREATER THAN 100.4 F (38 C) OR HIGHER *CHILLS OR SWEATING *NAUSEA AND VOMITING THAT IS NOT CONTROLLED WITH YOUR NAUSEA MEDICATION *UNUSUAL SHORTNESS OF BREATH *UNUSUAL BRUISING OR BLEEDING *URINARY PROBLEMS (pain or burning when urinating, or frequent urination) *BOWEL PROBLEMS (unusual diarrhea, constipation, pain near the anus) TENDERNESS IN MOUTH AND THROAT WITH OR WITHOUT PRESENCE OF ULCERS (sore throat, sores in mouth, or a toothache) UNUSUAL RASH, SWELLING OR PAIN  UNUSUAL VAGINAL DISCHARGE OR ITCHING   Items with * indicate a potential emergency and should be followed up as soon as possible or go to the Emergency Department if any problems should occur.  Please show the CHEMOTHERAPY ALERT CARD or IMMUNOTHERAPY ALERT CARD at check-in to the Emergency Department and triage nurse.  Should you have questions after your visit or need to cancel or reschedule your appointment, please contact [facility]  Dept: [phone]  and follow the prompts.  Office hours are 8:00 a.m. to 4:30 p.m. Monday - Friday. Please note that voicemails left  after 4:00 p.m. may not be returned until the following business day.  We are closed weekends and major holidays. You have access to a nurse at all times for urgent questions. Please call the main number to the clinic Dept: [phone] and follow the prompts.   For any non-urgent questions, you may also contact your provider using MyChart. We now offer e-Visits for anyone 38 and older to request care online for non-urgent symptoms. For details visit  [email or web address].   Also download the MyChart app! Go to the app store, search "MyChart", open the app, select , and log in with your MyChart username and password.  Due to Covid, a mask is required upon entering the hospital/clinic. If you do not have a mask, one will be given to you upon arrival. For doctor visits, patients may have 1 support person aged 66 or older with them. For treatment visits, patients cannot have anyone with them due to current Covid guidelines and our immunocompromised population.   Fluorouracil, 5-FU injection What is this medication? FLUOROURACIL, 5-FU (flure oh YOOR a sil) is a chemotherapy drug. It slows the growth of cancer cells. This medicine is used to treat many types of cancer like breast cancer, colon or rectal cancer, pancreatic cancer, and stomach cancer. This medicine may be used for other purposes; ask your health care provider or pharmacist if you have questions. COMMON BRAND NAME(S): Adrucil What should I tell my care team before I take this medication? They need to know if you have any of these conditions: blood disorders dihydropyrimidine dehydrogenase (DPD) deficiency infection (especially a virus infection such as chickenpox, cold sores, or herpes) kidney disease liver disease malnourished, poor nutrition recent or ongoing radiation therapy an unusual or allergic reaction to fluorouracil, other chemotherapy, other medicines, foods, dyes, or preservatives pregnant or trying to get pregnant breast-feeding How should I use this medication? This drug is given as an infusion or injection into a vein. It is administered in a hospital or clinic by a specially trained health care professional. Talk to your pediatrician regarding the use of this medicine in children. Special care may be needed. Overdosage: If you think you have taken too much of this medicine contact a poison control center or emergency room at once. NOTE: This  medicine is only for you. Do not share this medicine with others. What if I miss a dose? It is important not to miss your dose. Call your doctor or health care professional if you are unable to keep an appointment. What may interact with this medication? Do not take this medicine with any of the following medications: live virus vaccines This medicine may also interact with the following medications: medicines that treat or prevent blood clots like warfarin, enoxaparin, and dalteparin This list may not describe all possible interactions. Give your health care provider a list of all the medicines, herbs, non-prescription drugs, or dietary supplements you use. Also tell them if you smoke, drink alcohol, or use illegal drugs. Some items may interact with your medicine. What should I watch for while using this medication? Visit your doctor for checks on your progress. This drug may make you feel generally unwell. This is not uncommon, as chemotherapy can affect healthy cells as well as cancer cells. Report any side effects. Continue your course of treatment even though you feel ill unless your doctor tells you to stop. In some cases, you may be given additional medicines to help with side effects. Follow all directions for their  use. Call your doctor or health care professional for advice if you get a fever, chills or sore throat, or other symptoms of a cold or flu. Do not treat yourself. This drug decreases your body's ability to fight infections. Try to avoid being around people who are sick. This medicine may increase your risk to bruise or bleed. Call your doctor or health care professional if you notice any unusual bleeding. Be careful brushing and flossing your teeth or using a toothpick because you may get an infection or bleed more easily. If you have any dental work done, tell your dentist you are receiving this medicine. Avoid taking products that contain aspirin, acetaminophen, ibuprofen,  naproxen, or ketoprofen unless instructed by your doctor. These medicines may hide a fever. Do not become pregnant while taking this medicine. Women should inform their doctor if they wish to become pregnant or think they might be pregnant. There is a potential for serious side effects to an unborn child. Talk to your health care professional or pharmacist for more information. Do not breast-feed an infant while taking this medicine. Men should inform their doctor if they wish to father a child. This medicine may lower sperm counts. Do not treat diarrhea with over the counter products. Contact your doctor if you have diarrhea that lasts more than 2 days or if it is severe and watery. This medicine can make you more sensitive to the sun. Keep out of the sun. If you cannot avoid being in the sun, wear protective clothing and use sunscreen. Do not use sun lamps or tanning beds/booths. What side effects may I notice from receiving this medication? Side effects that you should report to your doctor or health care professional as soon as possible: allergic reactions like skin rash, itching or hives, swelling of the face, lips, or tongue low blood counts - this medicine may decrease the number of white blood cells, red blood cells and platelets. You may be at increased risk for infections and bleeding. signs of infection - fever or chills, cough, sore throat, pain or difficulty passing urine signs of decreased platelets or bleeding - bruising, pinpoint red spots on the skin, black, tarry stools, blood in the urine signs of decreased red blood cells - unusually weak or tired, fainting spells, lightheadedness breathing problems changes in vision chest pain mouth sores nausea and vomiting pain, swelling, redness at site where injected pain, tingling, numbness in the hands or feet redness, swelling, or sores on hands or feet stomach pain unusual bleeding Side effects that usually do not require medical  attention (report to your doctor or health care professional if they continue or are bothersome): changes in finger or toe nails diarrhea dry or itchy skin hair loss headache loss of appetite sensitivity of eyes to the light stomach upset unusually teary eyes This list may not describe all possible side effects. Call your doctor for medical advice about side effects. You may report side effects to FDA at 1-800-FDA-1088. Where should I keep my medication? This drug is given in a hospital or clinic and will not be stored at home. NOTE: This sheet is a summary. It may not cover all possible information. If you have questions about this medicine, talk to your doctor, pharmacist, or health care provider.  2022 Elsevier/Gold Standard (2020-11-11 00:00:00)

## 2021-01-21 NOTE — Progress Notes (Signed)
[facility] OFFICE PROGRESS NOTE   Diagnosis:  Rectal cancer  INTERVAL HISTORY:   William Good returns as scheduled.  He completed cycle 7 FOLFOX 01/07/2021.  He has mild intermittent nausea.  No mouth sores.  He had less diarrhea.  He continues to have neuropathy symptoms in the hands and feet.  He [place] the steps at church recently.  Objective:  Vital signs in last 24 hours:  Blood pressure 98/69, pulse 78, temperature 97.8 F (36.6 C), temperature source Oral, resp. rate 18, height 6' (1.829 m), weight 224 lb 9.6 oz (101.9 kg), SpO2 98 %.    HEENT: No thrush or ulcers. Resp: Lungs clear bilaterally. Cardio: Regular rate and rhythm. GI: Abdomen soft and nontender.  No hepatomegaly. Vascular: No leg edema. Neuro: Vibratory sense moderately decreased over the fingertips per tuning fork exam. Skin: Palms without erythema. Port-A-Cath without erythema.   Lab Results:  Lab Results  Component Value Date   WBC 11.4 (H) 01/21/2021   HGB 12.3 (L) 01/21/2021   HCT 39.8 01/21/2021   MCV 93.0 01/21/2021   PLT 163 01/21/2021   NEUTROABS 7.3 01/21/2021    Imaging:  No results found.  Medications: I have reviewed the patient's current medications.  Assessment/Plan: Rectal cancer Mass at 6 cm on digital exam/anoscopy 08/25/2020 Colonoscopy 09/05/2020-mid rectal mass-biopsy, adenocarcinoma, no loss of mismatch repair protein expression CT abdomen/pelvis 08/29/2020-small amount of free fluid in the pelvis, mildly enlarged left external iliac node, cystic changes of the left kidney with hydronephrosis and regions of cortical thinning with transition at the left UPJ suggesting chronic left UPJ obstruction MRI pelvis 09/24/2020-tumor 6 cm from the anal verge, 1.8 cm from the internal anal sphincter, T2, 7 mm lymph node in the left mesorectal sheath, N1 Cycle 1 FOLFOX 10/08/2020 Cycle 2 FOLFOX 10/22/2020 Cycle 3 FOLFOX 11/12/2020 (oxaliplatin dose reduced, white cell growth  factor support on day of pump discontinuation) Cycle 4 FOLFOX 11/26/2020, 5-FU bolus and infusion dose reduced secondary to diarrhea, Ziextenzo Cycle 5 FOLFOX 12/10/2020, 5-FU bolus held due to diarrhea, Ziextenzo Cycle 6 FOLFOX 12/24/2020, 5-FU bolus held, Ziextenzo Cycle 7 FOLFOX 01/07/2021, oxaliplatin held due to neuropathy, 5-FU bolus held, Ziextenzo held Cycle 8 FOLFOX 01/21/2021, oxaliplatin held due to neuropathy, 5-FU bolus held, Ziextenzo held     Diabetes Chronic kidney disease Bipolar disorder Hypertension Hyperlipidemia Restless leg syndrome Multiple colon polyps removed on the colonoscopy 09/05/2020-tubular adenomas including a rectal polyp with high-grade dysplasia-polypectomy margin negative for dysplasia Portacath placement, Dr Zenia Resides, 10/02/2020    Disposition: William Good appears stable.  He has completed 7 cycles of neoadjuvant FOLFOX.  Plan to proceed with the eighth and final cycle today as scheduled.  Oxaliplatin will be held due to neuropathy.  CBC and chemistry panel reviewed.  Labs adequate to proceed as above.  He has an appointment with radiation oncology tomorrow.  We anticipate he will begin the course of radiation 02/02/2021.  Dr. Benay Spice discussed concurrent Xeloda with him.  We reviewed potential toxicities including bone marrow toxicity, nausea, mouth sores, hair loss, diarrhea, hand-foot syndrome, increased sensitivity to sun, skin rash, skin hyperpigmentation.  He agrees to proceed.  He will return for lab and follow-up on 02/02/2021.  We are available to see him sooner if needed.  Patient seen with Dr. Benay Spice.    Ned Card ANP/GNP-BC   01/21/2021  10:29 AM  This was a shared visit with Ned Card.  William Good will complete a final cycle of neoadjuvant chemotherapy today.  He will begin concurrent capecitabine and radiation on 01/25/2021.  We reviewed potential toxicities associated with capecitabine.  He agrees to proceed.  An order was  placed for capecitabine today.  I was present for greater than 50% of today's visit.  I performed medical decision making.  Julieanne Manson, MD

## 2021-01-21 NOTE — Progress Notes (Signed)
Patient presents for treatment. RN assessment completed along with the following:  Labs/vitals reviewed - Yes, and within treatment parameters.   Weight within 10% of previous measurement - Yes Oncology Treatment Attestation completed for current therapy- Yes, on date 09/29/20 Informed consent completed and reflects current therapy/intent - Yes, on date 10/08/20             Provider progress note reviewed - Yes, today's provider note was reviewed. Treatment/Antibody/Supportive plan reviewed - Yes, and per Leander Rams, NP pump only S&H and other orders reviewed - Yes, and there are no additional orders identified. Previous treatment date reviewed - Yes, and the appropriate amount of time has elapsed between treatments. Clinic Hand Off Received from - Cristy Friedlander, RN   Patient to proceed with treatment. .pre

## 2021-01-21 NOTE — Progress Notes (Signed)
Patient seen by Ned Card NP today  Vitals are within treatment parameters.  Labs reviewed by Ned Card NP and are within treatment parameters.  Per physician team, patient is ready for treatment. Please note that modifications are being made to the treatment plan including Will only receive 5FU pump

## 2021-01-22 ENCOUNTER — Ambulatory Visit: Payer: Medicare Other | Admitting: Radiation Oncology

## 2021-01-22 ENCOUNTER — Encounter: Payer: Self-pay | Admitting: General Practice

## 2021-01-22 ENCOUNTER — Ambulatory Visit
Admission: RE | Admit: 2021-01-22 | Discharge: 2021-01-22 | Disposition: A | Discharge status: Home / Self Care | Payer: Medicare Other | Source: Ambulatory Visit | Attending: Radiation Oncology | Admitting: Radiation Oncology

## 2021-01-22 ENCOUNTER — Telehealth: Payer: Self-pay | Admitting: Pharmacy Technician

## 2021-01-22 ENCOUNTER — Telehealth: Payer: Self-pay | Admitting: Pharmacist

## 2021-01-22 ENCOUNTER — Encounter: Payer: Self-pay | Admitting: Radiation Oncology

## 2021-01-22 ENCOUNTER — Encounter: Payer: Self-pay | Admitting: Oncology

## 2021-01-22 ENCOUNTER — Other Ambulatory Visit (HOSPITAL_COMMUNITY): Payer: Self-pay

## 2021-01-22 VITALS — BP 102/63 | HR 71 | Temp 97.9°F | Resp 24 | Ht 72.0 in | Wt 231.2 lb

## 2021-01-22 DIAGNOSIS — F17210 Nicotine dependence, cigarettes, uncomplicated: Secondary | ICD-10-CM | POA: Diagnosis not present

## 2021-01-22 DIAGNOSIS — Z7982 Long term (current) use of aspirin: Secondary | ICD-10-CM | POA: Diagnosis not present

## 2021-01-22 DIAGNOSIS — C20 Malignant neoplasm of rectum: Secondary | ICD-10-CM | POA: Insufficient documentation

## 2021-01-22 DIAGNOSIS — Z510 Encounter for antineoplastic radiation therapy: Secondary | ICD-10-CM | POA: Diagnosis not present

## 2021-01-22 DIAGNOSIS — Z9221 Personal history of antineoplastic chemotherapy: Secondary | ICD-10-CM | POA: Diagnosis not present

## 2021-01-22 DIAGNOSIS — Z79899 Other long term (current) drug therapy: Secondary | ICD-10-CM | POA: Insufficient documentation

## 2021-01-22 DIAGNOSIS — I10 Essential (primary) hypertension: Secondary | ICD-10-CM | POA: Diagnosis not present

## 2021-01-22 DIAGNOSIS — G629 Polyneuropathy, unspecified: Secondary | ICD-10-CM | POA: Diagnosis not present

## 2021-01-22 MED ORDER — CAPECITABINE 500 MG PO TABS
ORAL_TABLET | ORAL | 0 refills | Status: DC
Start: 2021-02-02 — End: 2021-01-22
  Filled 2021-01-22: qty 196, fill #0

## 2021-01-22 MED ORDER — CAPECITABINE 500 MG PO TABS
ORAL_TABLET | ORAL | 0 refills | Status: DC
  Filled 2021-01-22: qty 196, fill #0
  Filled 2021-01-23: qty 196, 38d supply, fill #0

## 2021-01-22 NOTE — Progress Notes (Signed)
Patient reports overall weakness, fatigue, dizziness, insomnia, and a reduced appetite. No other symptoms reported at this time.  Meaningful use complete.  BP 102/63 (BP Location: Right Arm, Patient Position: Sitting, Cuff Size: Large)   Pulse 71   Temp 97.9 F (36.6 C)   Resp (!) 24   Ht 6' (1.829 m)   Wt 231 lb 3.2 oz (104.9 kg)   SpO2 97%   BMI 31.36 kg/m

## 2021-01-22 NOTE — Telephone Encounter (Signed)
Oral Oncology Pharmacist Encounter  Received new prescription for Xeloda (capecitabine) for the treatment of rectal cancer in conjunction with XRT, planned duration until the end of XRT. Planned start 02/02/21.  CMP from 01/21/21 assessed, no relevant lab abnormalities. Prescription dose and frequency assessed.   Current medication list in Epic reviewed,  a few DDIs with capecitabine identified: Omeprazole: Proton Pump Inhibitors (PPI) may diminish the therapeutic effect of capecitabine, varying information on the clinical impact. Recommend evaluating the need for a PPI/acid suppression. If acid suppression is needed, attempt switching to a H2 antagonist (eg, famotidine) if possible.  Risperidone: QT-prolonging Antipsychotics (Moderate Risk) may enhance the QTc-prolonging effect of Fluorouracil Products. Patient is on low dose of risperidone. Most recent ECG shows a QTcB of 420msec. Considering Risperidone low dose, the adjuvant use of capecitabine, and patient's baseline QTc no follow-up EGC needed unless patient changes clinically.   Evaluated chart and no patient barriers to medication adherence identified.   Prescription has been e-scribed to the [facility] for benefits analysis and approval.  Oral Oncology Clinic will continue to follow for insurance authorization, copayment issues, initial counseling and start date.  Patient agreed to treatment on 01/21/21 per MD documentation.  Darl Pikes, PharmD, BCPS, BCOP, CPP Hematology/Oncology Clinical Pharmacist Practitioner [facility] [phone]  01/22/2021 8:55 AM

## 2021-01-22 NOTE — Telephone Encounter (Signed)
Oral Oncology Patient Advocate Encounter  After completing a benefits investigation, prior authorization for Xeloda is not required at this time through [facility]  Patient's copay is $76.23 for #196 tablets for 38 day supply. (Full radiation treatment)  [place] Patient [place] Phone [phone] Fax [phone] 01/22/2021 8:45 AM

## 2021-01-22 NOTE — Progress Notes (Signed)
Radiation Oncology         ([phone]) [phone] ________________________________  Name: William Good        MRN: [id number]  Date of Service: 01/22/2021 DOB: 21-Aug-1955  CC:Aura Dials, MD  Michael Boston, MD     REFERRING PHYSICIAN: Michael Boston, MD   DIAGNOSIS: The encounter diagnosis was Rectal cancer [facility]).   HISTORY OF PRESENT ILLNESS: William Good is a 65 y.o. male originally seen at the request of Dr. Johney Maine in July 2022 for a new diagnosis of rectal cancer.  He did have rectal bleeding in 2021 and underwent banding of hemorrhoids with Dr. Johney Maine.  An increase in bleeding and constipation occurred  in June 2022. Dr. Johney Maine performed digital exam and a mass was palpable.  He was referred for colonoscopy and underwent this procedure on 09/05/2020 which identified multiple polyps throughout the colon as well as a large mass in the rectum, multiple biopsies were taken of the polyps and these were consistent with tubular adenomas however the rectal mass was consistent with invasive moderately differentiated adenocarcinoma. He underwent CT staging, and the images of the abdomen and pelvis on 08/29/2020 that he presented prior to his procedure with Dr. Rosalie Gums showed mildly enlarged left external iliac lymph node a small amount of free fluid posteriorly in the pelvis, cystic changes in the right kidney as well as in the left kidney and atherosclerotic disease.  He did not have any metastatic disease but MRI scan of the pelvis on 09/24/2020 showed T2 N1 disease with his tumor 1.8 cm from the internal anal sphincter.  Given this he was recommended to proceed with total neoadjuvant chemotherapy followed by chemoradiation and subsequent surgery.  Since his last visit he began total neoadjuvant chemotherapy with FOLFOX on 10/08/20. His last dose was yesterday on 01/21/21. He's seen today to discuss coordination of chemoRT.    PREVIOUS RADIATION THERAPY: No   PAST MEDICAL HISTORY:  Past  Medical History:  Diagnosis Date   Back pain    Bipolar disorder ([place])    bipolar 1   Hypertension    Knee pain    Migraine    Pre-diabetes    Rectal cancer ([place])        PAST SURGICAL HISTORY: Past Surgical History:  Procedure Laterality Date   BACK SURGERY     JOINT REPLACEMENT     PORTACATH PLACEMENT N/A 10/02/2020   Procedure: PORT-A-CATH PLACEMENT;  Surgeon: Dwan Bolt, MD;  Location: [facility] ORS;  Service: General;  Laterality: N/A;     FAMILY HISTORY:  Family History  Problem Relation Age of Onset   ALS Mother    Heart failure Father      SOCIAL HISTORY:  reports that he has been smoking cigarettes. He has been smoking an average of .5 packs per day. He has never used smokeless tobacco. He reports that he does not drink alcohol and does not use drugs.  The patient is divorced and lives in [place].  He is retired from working for a [employer].   ALLERGIES: Patient has no known allergies.   MEDICATIONS:  Current Outpatient Medications  Medication Sig Dispense Refill   acetaminophen (TYLENOL) 325 MG tablet Take 650 mg every 4 (four) hours as needed by mouth for mild pain.     albuterol (VENTOLIN HFA) 108 (90 Base) MCG/ACT inhaler Inhale 2 puffs into the lungs every 6 (six) hours as needed for wheezing or shortness of breath. 8 g 2   ALPRAZolam (XANAX) 1  MG tablet Take 1 mg by mouth 2 (two) times daily.     aspirin EC 81 MG tablet Take 81 mg by mouth daily. Swallow whole.     azithromycin (ZITHROMAX) 250 MG tablet TAKE 2 TABLETS BY MOUTH ON DAY 1, THEN TAKE 1 TABLET DAILY ON DAYS 2-5     benztropine (COGENTIN) 0.5 MG tablet Take 0.5 mg by mouth at bedtime.     [START ON 02/02/2021] capecitabine (XELODA) 500 MG tablet Take 2000 mg a.m., take 1500 mg p.m., total dose 3500 mg daily, take on days of radiation only 196 tablet 0   Cholecalciferol (VITAMIN D3) 50 MCG (2000 UT) capsule Take 2,000 Units by mouth daily.     Docusate Sodium (COLACE PO) Take 1 capsule by  mouth daily as needed.     HYDROcodone-acetaminophen (NORCO/VICODIN) 5-325 MG tablet Take 0.5-1 tablets by mouth every 12 (twelve) hours as needed for moderate pain. 30 tablet 0   lidocaine-prilocaine (EMLA) cream Apply 1 application topically as directed. Apply 1/2 tablespoon to port-a-cath site 2 hours prior to stick and cover with plastic wrap to numb site 30 g 0   loperamide (IMODIUM) 2 MG capsule Take 2 mg by mouth as needed for diarrhea or loose stools.     LOTENSIN HCT 20-12.5 MG per tablet TAKE (2) TABLETS DAILY (Patient taking differently: Take 1 tablet by mouth daily.) 60 each 0   mirtazapine (REMERON) 45 MG tablet Take 45 mg by mouth at bedtime.     omeprazole (PRILOSEC) 20 MG capsule Take 1 capsule (20 mg total) 2 (two) times daily by mouth. 60 capsule 0   ondansetron (ZOFRAN) 8 MG tablet Take 1 tablet (8 mg total) by mouth every 8 (eight) hours as needed for nausea or vomiting. 30 tablet 1   Polyethylene Glycol 3350 (MIRALAX PO) Take 17 g by mouth daily.     potassium chloride SA (KLOR-CON) 20 MEQ tablet TAKE ONE TABLET ONCE DAILY 30 tablet 1   pravastatin (PRAVACHOL) 40 MG tablet Take 40 mg by mouth daily.     predniSONE (DELTASONE) 10 MG tablet Take by mouth.     prochlorperazine (COMPAZINE) 10 MG tablet Take 1 tablet (10 mg total) by mouth every 6 (six) hours as needed for nausea. 60 tablet 1   propranolol ER (INDERAL LA) 160 MG SR capsule Take 160 mg daily by mouth.     risperiDONE (RISPERDAL) 0.25 MG tablet Take 0.25 mg by mouth at bedtime.     SUMAtriptan (IMITREX) 100 MG tablet Take 100 mg by mouth 2 (two) times daily as needed for migraine.     No current facility-administered medications for this encounter.     REVIEW OF SYSTEMS: On review of systems, the patient reports that he has been very fatigued and tired from his chemotherapy as well as nauseated but has taking antiemetics with some relief. He has taken imodium as well for diarrhea but only has 1-2 stools per day  whether he takes this or not. His rectal bleeding and discomfort have subsided. No other complaints are noted.      PHYSICAL EXAM:  Wt Readings from Last 3 Encounters:  01/22/21 231 lb 3.2 oz (104.9 kg)  01/21/21 224 lb 9.6 oz (101.9 kg)  01/07/21 225 lb 9.6 oz (102.3 kg)   Temp Readings from Last 3 Encounters:  01/22/21 97.9 F (36.6 C)  01/21/21 97.8 F (36.6 C) (Oral)  01/09/21 98.5 F (36.9 C) (Oral)   BP Readings from Last 3 Encounters:  01/22/21 102/63  01/21/21 98/69  01/09/21 100/81   Pulse Readings from Last 3 Encounters:  01/22/21 71  01/21/21 78  01/09/21 73   Pain Assessment Pain Score: 0-No pain/10  In general this is a well appearing caucasian male in no acute distress. He's alert and oriented x4 and appropriate throughout the examination. Cardiopulmonary assessment is negative for acute distress and he exhibits normal effort.     ECOG = 1  0 - Asymptomatic (Fully active, able to carry on all predisease activities without restriction)  1 - Symptomatic but completely ambulatory (Restricted in physically strenuous activity but ambulatory and able to carry out work of a light or sedentary nature. For example, light housework, office work)  2 - Symptomatic, <50% in bed during the day (Ambulatory and capable of all self care but unable to carry out any work activities. Up and about more than 50% of waking hours)  3 - Symptomatic, >50% in bed, but not bedbound (Capable of only limited self-care, confined to bed or chair 50% or more of waking hours)  4 - Bedbound (Completely disabled. Cannot carry on any self-care. Totally confined to bed or chair)  5 - Death   Eustace Pen MM, Creech RH, Tormey DC, et al. [phone]). "Toxicity and response criteria of the [facility]". [place] Oncol. 5 (6): 649-55    LABORATORY DATA:  Lab Results  Component Value Date   WBC 11.4 (H) 01/21/2021   HGB 12.3 (L) 01/21/2021   HCT 39.8 01/21/2021   MCV 93.0  01/21/2021   PLT 163 01/21/2021   Lab Results  Component Value Date   NA 141 01/21/2021   K 3.6 01/21/2021   CL 103 01/21/2021   CO2 32 01/21/2021   Lab Results  Component Value Date   ALT 12 01/21/2021   AST 20 01/21/2021   ALKPHOS 116 01/21/2021   BILITOT 0.5 01/21/2021      RADIOGRAPHY: No results found.     IMPRESSION/PLAN: 1. Stage IIIA, cT2N1M0 adenocarcinom of the distal rectum. Dr. Lisbeth Renshaw discusses the pathology findings and reviews the nature of rectal disease and his course to date. He finishes chemotherapy this week and will be ready to start chemoRT. We discussed the risks, benefits, short, and long term effects of radiotherapy, as well as the curative intent, and the patient is interested in proceeding at the appropriate time and pathway. Dr. Lisbeth Renshaw discusses the delivery and logistics of radiotherapy and anticipates a course of 5 1/2 weeks of radiotherapy. Written consent is obtained and placed in the chart, a copy was provided to the patient. He will simulate today and start treatment on 02/02/21.   In a visit lasting 60 minutes, greater than 50% of the time was spent face to face discussing the patient's condition, in preparation for the discussion, and coordinating the patient's care.    The above documentation reflects my direct findings during this shared patient visit. Please see the separate note by Dr. Lisbeth Renshaw on this date for the remainder of the patient's plan of care.    Carola Rhine, [facility]   **Disclaimer: This note was dictated with voice recognition software. Similar sounding words can inadvertently be transcribed and this note may contain transcription errors which may not have been corrected upon publication of note.**

## 2021-01-22 NOTE — Progress Notes (Signed)
[place] Psychosocial Distress Screening Clinical Social Work  Clinical Social Work was referred by distress screening protocol.  The patient scored a 8 on the Psychosocial Distress Thermometer which indicates moderate distress. Clinical Social Worker contacted patient by phone to assess for distress and other psychosocial needs. Number in [place] is his sister and brother in law.  Patient lives by himself but family checks in on him daily.  Brother in Sports coach provides transportation.    ON[facility] DISTRESS SCREENING 01/22/2021  Screening Type   Distress experienced in past week (1-10) 8  Practical problem type   Emotional problem type Nervousness/Anxiety;Adjusting to illness;Feeling hopeless  Physical Problem type   Physician notified of physical symptoms   Referral to clinical psychology   Referral to clinical social work   Referral to dietition   Referral to financial advocate   Referral to support programs   Referral to palliative care     Clinical Social Worker follow up needed: No.  If yes, follow up plan:  Beverely Pace, [place], LCSW Clinical Social Worker Phone:  [phone]

## 2021-01-23 ENCOUNTER — Other Ambulatory Visit: Payer: Self-pay

## 2021-01-23 ENCOUNTER — Other Ambulatory Visit (HOSPITAL_COMMUNITY): Payer: Self-pay

## 2021-01-23 ENCOUNTER — Inpatient Hospital Stay: Payer: Medicare Other

## 2021-01-23 VITALS — BP 118/83 | HR 75 | Temp 98.5°F | Resp 20

## 2021-01-23 DIAGNOSIS — G629 Polyneuropathy, unspecified: Secondary | ICD-10-CM | POA: Diagnosis not present

## 2021-01-23 DIAGNOSIS — C20 Malignant neoplasm of rectum: Secondary | ICD-10-CM

## 2021-01-23 DIAGNOSIS — Z510 Encounter for antineoplastic radiation therapy: Secondary | ICD-10-CM | POA: Diagnosis not present

## 2021-01-23 DIAGNOSIS — Z9221 Personal history of antineoplastic chemotherapy: Secondary | ICD-10-CM | POA: Diagnosis not present

## 2021-01-23 MED ORDER — HEPARIN SOD (PORK) LOCK FLUSH 100 UNIT/ML IV SOLN
500.0000 [IU] | Freq: Once | INTRAVENOUS | Status: AC | PRN
  Administered 2021-01-23: 500 [IU]

## 2021-01-23 MED ORDER — SODIUM CHLORIDE 0.9% FLUSH
10.0000 mL | INTRAVENOUS | Status: DC | PRN
  Administered 2021-01-23: 10 mL

## 2021-01-23 NOTE — Telephone Encounter (Signed)
Oral Chemotherapy Pharmacist Encounter  William Good will deliver Xeloda to patient on Tuesday 01/27/21. Patient's brother William Good knows William Good it not suppose to start until his first day of radiation treatment.  Patient Education I spoke with patient's brother William Good who manages his medications for overview of new oral chemotherapy medication: Xeloda (capecitabine) for the treatment of rectal cancer in conjunction with XRT, planned duration until the end of XRT. Planned start 02/02/21  Counseled William Good on administration, dosing, side effects, monitoring, drug-food interactions, safe handling, storage, and disposal. Patient will take 4 tablets (2000mg ) by mouth in AM and 3 tablets (1500mg ) in PM. Take with food. Take Monday-Friday. Take only on days of radiation.  Side effects include but not limited to: diarrhea, hand-foot syndrome, mouth sores, edema, decreased wbc, fatigue, N/V.   Diarrhea: per William Good patient has had issues with diarrhea from his prior chemotherapy and they have experience using loperamide. They will continue to use it as needed. William Good knows they should call if William Good is having 4 or more loose stools per day Nausea: They report still having antiemetics at home to use as needed Mouth sores: they will call to report mouth sores to obtain magic mouth wash Hand-foot syndrome: William Good knows they should report any skin changes. Suggested the use of [place] Smooth Extra Care 20 during Xeloda treatment.  Reviewed with William Good importance of keeping a medication schedule and plan for any missed doses.  Reviewed PPI DDI with William Good, they will begin weaning William Good off of his omeprazole while on capecitabine, he knows that famotidine can be used as an alternative.   After discussion with William Good no patient barriers to medication adherence identified.   William Good voiced understanding and appreciation. All questions answered. Medication handout provided.  Provided William Good with Oral Chemotherapy Navigation Clinic phone  number. William Good knows to call the office with questions or concerns. Oral Chemotherapy Navigation Clinic will continue to follow.  William Good, PharmD, BCPS, BCOP, CPP Hematology/Oncology Clinical Pharmacist Practitioner [facility] [facility] [phone]  01/23/2021 12:45 PM

## 2021-01-26 ENCOUNTER — Other Ambulatory Visit (HOSPITAL_COMMUNITY): Payer: Self-pay

## 2021-01-26 DIAGNOSIS — Z9221 Personal history of antineoplastic chemotherapy: Secondary | ICD-10-CM | POA: Diagnosis not present

## 2021-01-26 DIAGNOSIS — C20 Malignant neoplasm of rectum: Secondary | ICD-10-CM | POA: Diagnosis not present

## 2021-01-26 DIAGNOSIS — G629 Polyneuropathy, unspecified: Secondary | ICD-10-CM | POA: Diagnosis not present

## 2021-01-26 DIAGNOSIS — Z510 Encounter for antineoplastic radiation therapy: Secondary | ICD-10-CM | POA: Diagnosis not present

## 2021-01-26 NOTE — Telephone Encounter (Signed)
Oral Oncology Patient Advocate Encounter  I spoke with Mr Wanke family to set up delivery of Xeloda.  Address verified for shipment to [facility].  Xeloda will be filled through [facility] and mailed 01/26/21 for delivery 01/27/21.    Viola will call 7-10 days before next refill is due to complete adherence call and set up delivery of medication.     [place] Patient [place] Phone [phone] Fax [phone] 01/26/2021 2:20 PM

## 2021-01-28 ENCOUNTER — Other Ambulatory Visit: Payer: Self-pay | Admitting: Nurse Practitioner

## 2021-01-28 DIAGNOSIS — C20 Malignant neoplasm of rectum: Secondary | ICD-10-CM

## 2021-02-02 ENCOUNTER — Inpatient Hospital Stay: Payer: Medicare Other | Admitting: Nurse Practitioner

## 2021-02-02 ENCOUNTER — Ambulatory Visit
Admission: RE | Admit: 2021-02-02 | Discharge: 2021-02-02 | Disposition: A | Discharge status: Home / Self Care | Payer: Medicare Other | Source: Ambulatory Visit | Attending: Radiation Oncology | Admitting: Radiation Oncology

## 2021-02-02 ENCOUNTER — Encounter: Payer: Self-pay | Admitting: Nurse Practitioner

## 2021-02-02 ENCOUNTER — Other Ambulatory Visit: Payer: Self-pay

## 2021-02-02 ENCOUNTER — Inpatient Hospital Stay: Payer: Medicare Other

## 2021-02-02 VITALS — BP 122/80 | HR 73 | Temp 97.9°F | Resp 16 | Wt 227.8 lb

## 2021-02-02 DIAGNOSIS — Z95828 Presence of other vascular implants and grafts: Secondary | ICD-10-CM

## 2021-02-02 DIAGNOSIS — G629 Polyneuropathy, unspecified: Secondary | ICD-10-CM | POA: Diagnosis not present

## 2021-02-02 DIAGNOSIS — Z9221 Personal history of antineoplastic chemotherapy: Secondary | ICD-10-CM | POA: Diagnosis not present

## 2021-02-02 DIAGNOSIS — C20 Malignant neoplasm of rectum: Secondary | ICD-10-CM

## 2021-02-02 DIAGNOSIS — Z510 Encounter for antineoplastic radiation therapy: Secondary | ICD-10-CM | POA: Diagnosis not present

## 2021-02-02 LAB — CBC WITH DIFFERENTIAL (CANCER CENTER ONLY)
Abs Immature Granulocytes: 0.02 10*3/uL (ref 0.00–0.07)
Basophils Absolute: 0 10*3/uL (ref 0.0–0.1)
Basophils Relative: 0 %
Eosinophils Absolute: 0.2 10*3/uL (ref 0.0–0.5)
Eosinophils Relative: 2 %
HCT: 40 % (ref 39.0–52.0)
Hemoglobin: 12.1 g/dL — ABNORMAL LOW (ref 13.0–17.0)
Immature Granulocytes: 0 %
Lymphocytes Relative: 25 %
Lymphs Abs: 2.4 10*3/uL (ref 0.7–4.0)
MCH: 28.1 pg (ref 26.0–34.0)
MCHC: 30.3 g/dL (ref 30.0–36.0)
MCV: 92.8 fL (ref 80.0–100.0)
Monocytes Absolute: 0.8 10*3/uL (ref 0.1–1.0)
Monocytes Relative: 9 %
Neutro Abs: 6.1 10*3/uL (ref 1.7–7.7)
Neutrophils Relative %: 64 %
Platelet Count: 143 10*3/uL — ABNORMAL LOW (ref 150–400)
RBC: 4.31 MIL/uL (ref 4.22–5.81)
RDW: 19.9 % — ABNORMAL HIGH (ref 11.5–15.5)
WBC Count: 9.6 10*3/uL (ref 4.0–10.5)
nRBC: 0 % (ref 0.0–0.2)

## 2021-02-02 LAB — CMP (CANCER CENTER ONLY)
ALT: 10 U/L (ref 0–44)
AST: 19 U/L (ref 15–41)
Albumin: 3.8 g/dL (ref 3.5–5.0)
Alkaline Phosphatase: 105 U/L (ref 38–126)
Anion gap: 7 (ref 5–15)
BUN: 6 mg/dL — ABNORMAL LOW (ref 8–23)
CO2: 30 mmol/L (ref 22–32)
Calcium: 9.6 mg/dL (ref 8.9–10.3)
Chloride: 102 mmol/L (ref 98–111)
Creatinine: 0.99 mg/dL (ref 0.61–1.24)
GFR, Estimated: >60 mL/min (ref >60)
Glucose, Bld: 81 mg/dL (ref 70–99)
Potassium: 4 mmol/L (ref 3.5–5.1)
Sodium: 139 mmol/L (ref 135–145)
Total Bilirubin: 0.7 mg/dL (ref 0.3–1.2)
Total Protein: 6.1 g/dL — ABNORMAL LOW (ref 6.5–8.1)

## 2021-02-02 MED ORDER — SODIUM CHLORIDE 0.9% FLUSH
10.0000 mL | Freq: Once | INTRAVENOUS | Status: AC
  Administered 2021-02-02: 13:00:00 10 mL via INTRAVENOUS

## 2021-02-02 MED ORDER — HEPARIN SOD (PORK) LOCK FLUSH 100 UNIT/ML IV SOLN
500.0000 [IU] | Freq: Once | INTRAVENOUS | Status: AC
  Administered 2021-02-02: 13:00:00 500 [IU] via INTRAVENOUS

## 2021-02-02 NOTE — Progress Notes (Signed)
  [facility] OFFICE PROGRESS NOTE   Diagnosis: Rectal cancer  INTERVAL HISTORY:   William Good returns as scheduled.  He completed cycle 8 FOLFOX 01/23/2021.  Oxaliplatin was held due to neuropathy.  He began radiation earlier today.  Overall he feels well.  He has mild intermittent nausea.  No mouth sores.  Loose stools at baseline.  He takes Imodium with good results.  He denies neuropathy symptoms.  Objective:  Vital signs in last 24 hours:  Blood pressure 122/80, pulse 73, temperature 97.9 F (36.6 C), temperature source Temporal, resp. rate 16, weight 227 lb 12.8 oz (103.3 kg), SpO2 94 %.    HEENT: No thrush or ulcers. Resp: Lungs clear bilaterally. Cardio: Regular rate and rhythm. GI: Abdomen soft and nontender.  No hepatosplenomegaly. Vascular: No leg edema. Skin: Palms without erythema. Port-A-Cath without erythema.   Lab Results:  Lab Results  Component Value Date   WBC 9.6 02/02/2021   HGB 12.1 (L) 02/02/2021   HCT 40.0 02/02/2021   MCV 92.8 02/02/2021   PLT 143 (L) 02/02/2021   NEUTROABS 6.1 02/02/2021    Imaging:  No results found.  Medications: I have reviewed the patient's current medications.  Assessment/Plan: Rectal cancer Mass at 6 cm on digital exam/anoscopy 08/25/2020 Colonoscopy 09/05/2020-mid rectal mass-biopsy, adenocarcinoma, no loss of mismatch repair protein expression CT abdomen/pelvis 08/29/2020-small amount of free fluid in the pelvis, mildly enlarged left external iliac node, cystic changes of the left kidney with hydronephrosis and regions of cortical thinning with transition at the left UPJ suggesting chronic left UPJ obstruction MRI pelvis 09/24/2020-tumor 6 cm from the anal verge, 1.8 cm from the internal anal sphincter, T2, 7 mm lymph node in the left mesorectal sheath, N1 Cycle 1 FOLFOX 10/08/2020 Cycle 2 FOLFOX 10/22/2020 Cycle 3 FOLFOX 11/12/2020 (oxaliplatin dose reduced, white cell growth factor support on day of pump  discontinuation) Cycle 4 FOLFOX 11/26/2020, 5-FU bolus and infusion dose reduced secondary to diarrhea, Ziextenzo Cycle 5 FOLFOX 12/10/2020, 5-FU bolus held due to diarrhea, Ziextenzo Cycle 6 FOLFOX 12/24/2020, 5-FU bolus held, Ziextenzo Cycle 7 FOLFOX 01/07/2021, oxaliplatin held due to neuropathy, 5-FU bolus held, Ziextenzo held Cycle 8 FOLFOX 01/21/2021, oxaliplatin held due to neuropathy, 5-FU bolus held, Ziextenzo held Radiation/Xeloda 02/02/2021     Diabetes Chronic kidney disease Bipolar disorder Hypertension Hyperlipidemia Restless leg syndrome Multiple colon polyps removed on the colonoscopy 09/05/2020-tubular adenomas including a rectal polyp with high-grade dysplasia-polypectomy margin negative for dysplasia Portacath placement, Dr Zenia Resides, 10/02/2020  Disposition: William Good appears stable.  He began the course of radiation/Xeloda earlier today.  We again discussed potential toxicities associated with Xeloda.  He understands to contact the office should he develop any of these.    CBC from today reviewed.  Labs adequate to continue with Xeloda.  He will return for lab and follow-up in 2 weeks.  We are available to see him sooner if needed.   Ned Card ANP/GNP-BC   02/02/2021  1:43 PM

## 2021-02-03 ENCOUNTER — Ambulatory Visit
Admission: RE | Admit: 2021-02-03 | Discharge: 2021-02-03 | Disposition: A | Discharge status: Home / Self Care | Payer: Medicare Other | Source: Ambulatory Visit | Attending: Radiation Oncology | Admitting: Radiation Oncology

## 2021-02-03 ENCOUNTER — Other Ambulatory Visit: Payer: Self-pay

## 2021-02-03 DIAGNOSIS — G629 Polyneuropathy, unspecified: Secondary | ICD-10-CM | POA: Diagnosis not present

## 2021-02-03 DIAGNOSIS — Z9221 Personal history of antineoplastic chemotherapy: Secondary | ICD-10-CM | POA: Diagnosis not present

## 2021-02-03 DIAGNOSIS — Z510 Encounter for antineoplastic radiation therapy: Secondary | ICD-10-CM | POA: Diagnosis not present

## 2021-02-03 DIAGNOSIS — C20 Malignant neoplasm of rectum: Secondary | ICD-10-CM | POA: Diagnosis not present

## 2021-02-04 ENCOUNTER — Other Ambulatory Visit: Payer: Self-pay

## 2021-02-04 ENCOUNTER — Ambulatory Visit
Admission: RE | Admit: 2021-02-04 | Discharge: 2021-02-04 | Disposition: A | Discharge status: Home / Self Care | Payer: Medicare Other | Source: Ambulatory Visit | Attending: Radiation Oncology | Admitting: Radiation Oncology

## 2021-02-04 DIAGNOSIS — G629 Polyneuropathy, unspecified: Secondary | ICD-10-CM | POA: Diagnosis not present

## 2021-02-04 DIAGNOSIS — Z9221 Personal history of antineoplastic chemotherapy: Secondary | ICD-10-CM | POA: Diagnosis not present

## 2021-02-04 DIAGNOSIS — Z510 Encounter for antineoplastic radiation therapy: Secondary | ICD-10-CM | POA: Diagnosis not present

## 2021-02-04 DIAGNOSIS — C20 Malignant neoplasm of rectum: Secondary | ICD-10-CM | POA: Diagnosis not present

## 2021-02-05 ENCOUNTER — Ambulatory Visit: Payer: Medicare Other

## 2021-02-06 ENCOUNTER — Ambulatory Visit
Admission: RE | Admit: 2021-02-06 | Discharge: 2021-02-06 | Disposition: A | Discharge status: Home / Self Care | Payer: Medicare Other | Source: Ambulatory Visit | Attending: Radiation Oncology | Admitting: Radiation Oncology

## 2021-02-06 ENCOUNTER — Other Ambulatory Visit: Payer: Self-pay

## 2021-02-06 DIAGNOSIS — C20 Malignant neoplasm of rectum: Secondary | ICD-10-CM | POA: Insufficient documentation

## 2021-02-06 DIAGNOSIS — G629 Polyneuropathy, unspecified: Secondary | ICD-10-CM | POA: Insufficient documentation

## 2021-02-06 DIAGNOSIS — I129 Hypertensive chronic kidney disease with stage 1 through stage 4 chronic kidney disease, or unspecified chronic kidney disease: Secondary | ICD-10-CM | POA: Diagnosis not present

## 2021-02-06 DIAGNOSIS — R197 Diarrhea, unspecified: Secondary | ICD-10-CM | POA: Diagnosis not present

## 2021-02-06 DIAGNOSIS — E1140 Type 2 diabetes mellitus with diabetic neuropathy, unspecified: Secondary | ICD-10-CM | POA: Diagnosis not present

## 2021-02-06 DIAGNOSIS — E785 Hyperlipidemia, unspecified: Secondary | ICD-10-CM | POA: Diagnosis not present

## 2021-02-06 DIAGNOSIS — G2581 Restless legs syndrome: Secondary | ICD-10-CM | POA: Diagnosis not present

## 2021-02-06 DIAGNOSIS — Z510 Encounter for antineoplastic radiation therapy: Secondary | ICD-10-CM | POA: Insufficient documentation

## 2021-02-06 DIAGNOSIS — Z9221 Personal history of antineoplastic chemotherapy: Secondary | ICD-10-CM | POA: Insufficient documentation

## 2021-02-06 DIAGNOSIS — F319 Bipolar disorder, unspecified: Secondary | ICD-10-CM | POA: Diagnosis not present

## 2021-02-06 DIAGNOSIS — E1122 Type 2 diabetes mellitus with diabetic chronic kidney disease: Secondary | ICD-10-CM | POA: Diagnosis not present

## 2021-02-06 DIAGNOSIS — N189 Chronic kidney disease, unspecified: Secondary | ICD-10-CM | POA: Diagnosis not present

## 2021-02-06 DIAGNOSIS — R110 Nausea: Secondary | ICD-10-CM | POA: Diagnosis not present

## 2021-02-06 NOTE — Progress Notes (Signed)
Pt here for patient teaching.  Pt given Radiation and You booklet.  Reviewed areas of pertinence such as diarrhea, fatigue, hair loss, sexual and fertility changes, skin changes, and urinary and bladder changes . Pt able to give teach back of to pat skin, use unscented/gentle soap, use baby wipes, have Imodium on hand, drink plenty of water, and sitz bath,avoid applying anything to skin within 4 hours of treatment. Pt verbalizes understanding of information given and will contact nursing with any questions or concerns.

## 2021-02-09 ENCOUNTER — Ambulatory Visit
Admission: RE | Admit: 2021-02-09 | Discharge: 2021-02-09 | Disposition: A | Discharge status: Home / Self Care | Payer: Medicare Other | Source: Ambulatory Visit | Attending: Radiation Oncology | Admitting: Radiation Oncology

## 2021-02-09 ENCOUNTER — Other Ambulatory Visit: Payer: Self-pay

## 2021-02-09 DIAGNOSIS — E785 Hyperlipidemia, unspecified: Secondary | ICD-10-CM | POA: Diagnosis not present

## 2021-02-09 DIAGNOSIS — G2581 Restless legs syndrome: Secondary | ICD-10-CM | POA: Diagnosis not present

## 2021-02-09 DIAGNOSIS — R110 Nausea: Secondary | ICD-10-CM | POA: Diagnosis not present

## 2021-02-09 DIAGNOSIS — E1122 Type 2 diabetes mellitus with diabetic chronic kidney disease: Secondary | ICD-10-CM | POA: Diagnosis not present

## 2021-02-09 DIAGNOSIS — C20 Malignant neoplasm of rectum: Secondary | ICD-10-CM | POA: Diagnosis not present

## 2021-02-09 DIAGNOSIS — N189 Chronic kidney disease, unspecified: Secondary | ICD-10-CM | POA: Diagnosis not present

## 2021-02-09 DIAGNOSIS — I129 Hypertensive chronic kidney disease with stage 1 through stage 4 chronic kidney disease, or unspecified chronic kidney disease: Secondary | ICD-10-CM | POA: Diagnosis not present

## 2021-02-09 DIAGNOSIS — R197 Diarrhea, unspecified: Secondary | ICD-10-CM | POA: Diagnosis not present

## 2021-02-09 DIAGNOSIS — G629 Polyneuropathy, unspecified: Secondary | ICD-10-CM | POA: Diagnosis not present

## 2021-02-09 DIAGNOSIS — Z510 Encounter for antineoplastic radiation therapy: Secondary | ICD-10-CM | POA: Diagnosis not present

## 2021-02-09 DIAGNOSIS — E1140 Type 2 diabetes mellitus with diabetic neuropathy, unspecified: Secondary | ICD-10-CM | POA: Diagnosis not present

## 2021-02-09 DIAGNOSIS — Z9221 Personal history of antineoplastic chemotherapy: Secondary | ICD-10-CM | POA: Diagnosis not present

## 2021-02-10 ENCOUNTER — Ambulatory Visit
Admission: RE | Admit: 2021-02-10 | Discharge: 2021-02-10 | Disposition: A | Discharge status: Home / Self Care | Payer: Medicare Other | Source: Ambulatory Visit | Attending: Radiation Oncology | Admitting: Radiation Oncology

## 2021-02-10 DIAGNOSIS — G629 Polyneuropathy, unspecified: Secondary | ICD-10-CM | POA: Diagnosis not present

## 2021-02-10 DIAGNOSIS — G2581 Restless legs syndrome: Secondary | ICD-10-CM | POA: Diagnosis not present

## 2021-02-10 DIAGNOSIS — N189 Chronic kidney disease, unspecified: Secondary | ICD-10-CM | POA: Diagnosis not present

## 2021-02-10 DIAGNOSIS — C20 Malignant neoplasm of rectum: Secondary | ICD-10-CM | POA: Diagnosis not present

## 2021-02-10 DIAGNOSIS — E785 Hyperlipidemia, unspecified: Secondary | ICD-10-CM | POA: Diagnosis not present

## 2021-02-10 DIAGNOSIS — E1122 Type 2 diabetes mellitus with diabetic chronic kidney disease: Secondary | ICD-10-CM | POA: Diagnosis not present

## 2021-02-10 DIAGNOSIS — E1140 Type 2 diabetes mellitus with diabetic neuropathy, unspecified: Secondary | ICD-10-CM | POA: Diagnosis not present

## 2021-02-10 DIAGNOSIS — R110 Nausea: Secondary | ICD-10-CM | POA: Diagnosis not present

## 2021-02-10 DIAGNOSIS — I129 Hypertensive chronic kidney disease with stage 1 through stage 4 chronic kidney disease, or unspecified chronic kidney disease: Secondary | ICD-10-CM | POA: Diagnosis not present

## 2021-02-10 DIAGNOSIS — Z510 Encounter for antineoplastic radiation therapy: Secondary | ICD-10-CM | POA: Diagnosis not present

## 2021-02-10 DIAGNOSIS — Z9221 Personal history of antineoplastic chemotherapy: Secondary | ICD-10-CM | POA: Diagnosis not present

## 2021-02-10 DIAGNOSIS — R197 Diarrhea, unspecified: Secondary | ICD-10-CM | POA: Diagnosis not present

## 2021-02-11 ENCOUNTER — Other Ambulatory Visit: Payer: Self-pay

## 2021-02-11 ENCOUNTER — Ambulatory Visit
Admission: RE | Admit: 2021-02-11 | Discharge: 2021-02-11 | Disposition: A | Discharge status: Home / Self Care | Payer: Medicare Other | Source: Ambulatory Visit | Attending: Radiation Oncology | Admitting: Radiation Oncology

## 2021-02-11 ENCOUNTER — Other Ambulatory Visit (HOSPITAL_COMMUNITY): Payer: Self-pay

## 2021-02-11 DIAGNOSIS — Z9221 Personal history of antineoplastic chemotherapy: Secondary | ICD-10-CM | POA: Diagnosis not present

## 2021-02-11 DIAGNOSIS — G2581 Restless legs syndrome: Secondary | ICD-10-CM | POA: Diagnosis not present

## 2021-02-11 DIAGNOSIS — E1122 Type 2 diabetes mellitus with diabetic chronic kidney disease: Secondary | ICD-10-CM | POA: Diagnosis not present

## 2021-02-11 DIAGNOSIS — G629 Polyneuropathy, unspecified: Secondary | ICD-10-CM | POA: Diagnosis not present

## 2021-02-11 DIAGNOSIS — Z510 Encounter for antineoplastic radiation therapy: Secondary | ICD-10-CM | POA: Diagnosis not present

## 2021-02-11 DIAGNOSIS — I129 Hypertensive chronic kidney disease with stage 1 through stage 4 chronic kidney disease, or unspecified chronic kidney disease: Secondary | ICD-10-CM | POA: Diagnosis not present

## 2021-02-11 DIAGNOSIS — E785 Hyperlipidemia, unspecified: Secondary | ICD-10-CM | POA: Diagnosis not present

## 2021-02-11 DIAGNOSIS — N189 Chronic kidney disease, unspecified: Secondary | ICD-10-CM | POA: Diagnosis not present

## 2021-02-11 DIAGNOSIS — R197 Diarrhea, unspecified: Secondary | ICD-10-CM | POA: Diagnosis not present

## 2021-02-11 DIAGNOSIS — R110 Nausea: Secondary | ICD-10-CM | POA: Diagnosis not present

## 2021-02-11 DIAGNOSIS — E1140 Type 2 diabetes mellitus with diabetic neuropathy, unspecified: Secondary | ICD-10-CM | POA: Diagnosis not present

## 2021-02-11 DIAGNOSIS — C20 Malignant neoplasm of rectum: Secondary | ICD-10-CM | POA: Diagnosis not present

## 2021-02-12 ENCOUNTER — Ambulatory Visit
Admission: RE | Admit: 2021-02-12 | Discharge: 2021-02-12 | Disposition: A | Discharge status: Home / Self Care | Payer: Medicare Other | Source: Ambulatory Visit | Attending: Radiation Oncology | Admitting: Radiation Oncology

## 2021-02-12 DIAGNOSIS — G629 Polyneuropathy, unspecified: Secondary | ICD-10-CM | POA: Diagnosis not present

## 2021-02-12 DIAGNOSIS — R197 Diarrhea, unspecified: Secondary | ICD-10-CM | POA: Diagnosis not present

## 2021-02-12 DIAGNOSIS — R110 Nausea: Secondary | ICD-10-CM | POA: Diagnosis not present

## 2021-02-12 DIAGNOSIS — C20 Malignant neoplasm of rectum: Secondary | ICD-10-CM | POA: Diagnosis not present

## 2021-02-12 DIAGNOSIS — Z510 Encounter for antineoplastic radiation therapy: Secondary | ICD-10-CM | POA: Diagnosis not present

## 2021-02-12 DIAGNOSIS — Z9221 Personal history of antineoplastic chemotherapy: Secondary | ICD-10-CM | POA: Diagnosis not present

## 2021-02-12 DIAGNOSIS — E1140 Type 2 diabetes mellitus with diabetic neuropathy, unspecified: Secondary | ICD-10-CM | POA: Diagnosis not present

## 2021-02-12 DIAGNOSIS — G2581 Restless legs syndrome: Secondary | ICD-10-CM | POA: Diagnosis not present

## 2021-02-12 DIAGNOSIS — E1122 Type 2 diabetes mellitus with diabetic chronic kidney disease: Secondary | ICD-10-CM | POA: Diagnosis not present

## 2021-02-12 DIAGNOSIS — N189 Chronic kidney disease, unspecified: Secondary | ICD-10-CM | POA: Diagnosis not present

## 2021-02-12 DIAGNOSIS — E785 Hyperlipidemia, unspecified: Secondary | ICD-10-CM | POA: Diagnosis not present

## 2021-02-12 DIAGNOSIS — I129 Hypertensive chronic kidney disease with stage 1 through stage 4 chronic kidney disease, or unspecified chronic kidney disease: Secondary | ICD-10-CM | POA: Diagnosis not present

## 2021-02-13 ENCOUNTER — Other Ambulatory Visit: Payer: Self-pay

## 2021-02-13 ENCOUNTER — Ambulatory Visit
Admission: RE | Admit: 2021-02-13 | Discharge: 2021-02-13 | Disposition: A | Discharge status: Home / Self Care | Payer: Medicare Other | Source: Ambulatory Visit | Attending: Radiation Oncology | Admitting: Radiation Oncology

## 2021-02-13 DIAGNOSIS — N189 Chronic kidney disease, unspecified: Secondary | ICD-10-CM | POA: Diagnosis not present

## 2021-02-13 DIAGNOSIS — E1122 Type 2 diabetes mellitus with diabetic chronic kidney disease: Secondary | ICD-10-CM | POA: Diagnosis not present

## 2021-02-13 DIAGNOSIS — E785 Hyperlipidemia, unspecified: Secondary | ICD-10-CM | POA: Diagnosis not present

## 2021-02-13 DIAGNOSIS — R197 Diarrhea, unspecified: Secondary | ICD-10-CM | POA: Diagnosis not present

## 2021-02-13 DIAGNOSIS — E1140 Type 2 diabetes mellitus with diabetic neuropathy, unspecified: Secondary | ICD-10-CM | POA: Diagnosis not present

## 2021-02-13 DIAGNOSIS — G629 Polyneuropathy, unspecified: Secondary | ICD-10-CM | POA: Diagnosis not present

## 2021-02-13 DIAGNOSIS — R110 Nausea: Secondary | ICD-10-CM | POA: Diagnosis not present

## 2021-02-13 DIAGNOSIS — C20 Malignant neoplasm of rectum: Secondary | ICD-10-CM | POA: Diagnosis not present

## 2021-02-13 DIAGNOSIS — I129 Hypertensive chronic kidney disease with stage 1 through stage 4 chronic kidney disease, or unspecified chronic kidney disease: Secondary | ICD-10-CM | POA: Diagnosis not present

## 2021-02-13 DIAGNOSIS — Z9221 Personal history of antineoplastic chemotherapy: Secondary | ICD-10-CM | POA: Diagnosis not present

## 2021-02-13 DIAGNOSIS — Z510 Encounter for antineoplastic radiation therapy: Secondary | ICD-10-CM | POA: Diagnosis not present

## 2021-02-13 DIAGNOSIS — G2581 Restless legs syndrome: Secondary | ICD-10-CM | POA: Diagnosis not present

## 2021-02-16 ENCOUNTER — Inpatient Hospital Stay: Payer: Medicare Other | Attending: Oncology | Admitting: Oncology

## 2021-02-16 ENCOUNTER — Inpatient Hospital Stay: Payer: Medicare Other

## 2021-02-16 ENCOUNTER — Ambulatory Visit
Admission: RE | Admit: 2021-02-16 | Discharge: 2021-02-16 | Disposition: A | Discharge status: Home / Self Care | Payer: Medicare Other | Source: Ambulatory Visit | Attending: Radiation Oncology | Admitting: Radiation Oncology

## 2021-02-16 ENCOUNTER — Other Ambulatory Visit: Payer: Self-pay

## 2021-02-16 VITALS — BP 103/68 | HR 75 | Temp 97.8°F | Resp 18 | Ht 72.0 in | Wt 226.0 lb

## 2021-02-16 DIAGNOSIS — F319 Bipolar disorder, unspecified: Secondary | ICD-10-CM | POA: Insufficient documentation

## 2021-02-16 DIAGNOSIS — Z510 Encounter for antineoplastic radiation therapy: Secondary | ICD-10-CM | POA: Insufficient documentation

## 2021-02-16 DIAGNOSIS — N189 Chronic kidney disease, unspecified: Secondary | ICD-10-CM | POA: Insufficient documentation

## 2021-02-16 DIAGNOSIS — C20 Malignant neoplasm of rectum: Secondary | ICD-10-CM | POA: Diagnosis not present

## 2021-02-16 DIAGNOSIS — Z9221 Personal history of antineoplastic chemotherapy: Secondary | ICD-10-CM | POA: Diagnosis not present

## 2021-02-16 DIAGNOSIS — R110 Nausea: Secondary | ICD-10-CM | POA: Diagnosis not present

## 2021-02-16 DIAGNOSIS — G2581 Restless legs syndrome: Secondary | ICD-10-CM | POA: Insufficient documentation

## 2021-02-16 DIAGNOSIS — R197 Diarrhea, unspecified: Secondary | ICD-10-CM | POA: Insufficient documentation

## 2021-02-16 DIAGNOSIS — E1122 Type 2 diabetes mellitus with diabetic chronic kidney disease: Secondary | ICD-10-CM | POA: Diagnosis not present

## 2021-02-16 DIAGNOSIS — E1140 Type 2 diabetes mellitus with diabetic neuropathy, unspecified: Secondary | ICD-10-CM | POA: Insufficient documentation

## 2021-02-16 DIAGNOSIS — G629 Polyneuropathy, unspecified: Secondary | ICD-10-CM | POA: Insufficient documentation

## 2021-02-16 DIAGNOSIS — E785 Hyperlipidemia, unspecified: Secondary | ICD-10-CM | POA: Diagnosis not present

## 2021-02-16 DIAGNOSIS — Z95828 Presence of other vascular implants and grafts: Secondary | ICD-10-CM

## 2021-02-16 DIAGNOSIS — I129 Hypertensive chronic kidney disease with stage 1 through stage 4 chronic kidney disease, or unspecified chronic kidney disease: Secondary | ICD-10-CM | POA: Insufficient documentation

## 2021-02-16 LAB — CBC WITH DIFFERENTIAL (CANCER CENTER ONLY)
Abs Immature Granulocytes: 0.05 10*3/uL (ref 0.00–0.07)
Basophils Absolute: 0 10*3/uL (ref 0.0–0.1)
Basophils Relative: 0 %
Eosinophils Absolute: 0.3 10*3/uL (ref 0.0–0.5)
Eosinophils Relative: 4 %
HCT: 37.7 % — ABNORMAL LOW (ref 39.0–52.0)
Hemoglobin: 11.8 g/dL — ABNORMAL LOW (ref 13.0–17.0)
Immature Granulocytes: 1 %
Lymphocytes Relative: 20 %
Lymphs Abs: 1.2 10*3/uL (ref 0.7–4.0)
MCH: 29.9 pg (ref 26.0–34.0)
MCHC: 31.3 g/dL (ref 30.0–36.0)
MCV: 95.4 fL (ref 80.0–100.0)
Monocytes Absolute: 0.4 10*3/uL (ref 0.1–1.0)
Monocytes Relative: 7 %
Neutro Abs: 4 10*3/uL (ref 1.7–7.7)
Neutrophils Relative %: 68 %
Platelet Count: 101 10*3/uL — ABNORMAL LOW (ref 150–400)
RBC: 3.95 MIL/uL — ABNORMAL LOW (ref 4.22–5.81)
RDW: 21.1 % — ABNORMAL HIGH (ref 11.5–15.5)
WBC Count: 5.9 10*3/uL (ref 4.0–10.5)
nRBC: 0 % (ref 0.0–0.2)

## 2021-02-16 LAB — CMP (CANCER CENTER ONLY)
ALT: 11 U/L (ref 0–44)
AST: 20 U/L (ref 15–41)
Albumin: 3.6 g/dL (ref 3.5–5.0)
Alkaline Phosphatase: 82 U/L (ref 38–126)
Anion gap: 4 — ABNORMAL LOW (ref 5–15)
BUN: 6 mg/dL — ABNORMAL LOW (ref 8–23)
CO2: 33 mmol/L — ABNORMAL HIGH (ref 22–32)
Calcium: 8.9 mg/dL (ref 8.9–10.3)
Chloride: 103 mmol/L (ref 98–111)
Creatinine: 1.06 mg/dL (ref 0.61–1.24)
GFR, Estimated: >60 mL/min (ref >60)
Glucose, Bld: 161 mg/dL — ABNORMAL HIGH (ref 70–99)
Potassium: 3.8 mmol/L (ref 3.5–5.1)
Sodium: 140 mmol/L (ref 135–145)
Total Bilirubin: 0.5 mg/dL (ref 0.3–1.2)
Total Protein: 6 g/dL — ABNORMAL LOW (ref 6.5–8.1)

## 2021-02-16 MED ORDER — HEPARIN SOD (PORK) LOCK FLUSH 100 UNIT/ML IV SOLN
500.0000 [IU] | Freq: Once | INTRAVENOUS | Status: AC
  Administered 2021-02-16: 500 [IU] via INTRAVENOUS

## 2021-02-16 MED ORDER — SODIUM CHLORIDE 0.9% FLUSH
10.0000 mL | Freq: Once | INTRAVENOUS | Status: AC
  Administered 2021-02-16: 10 mL via INTRAVENOUS

## 2021-02-16 NOTE — Patient Instructions (Signed)

## 2021-02-16 NOTE — Progress Notes (Signed)
[facility] OFFICE PROGRESS NOTE   Diagnosis: Rectal cancer  INTERVAL HISTORY:   William Good returns as scheduled.  He continues Xeloda and radiation.  Neuropathy symptoms have resolved.  No nausea or hand/foot pain.  He has intermittent diarrhea, up to 2 or more times per day.  Imodium helps.  He has abdominal discomfort after eating large meals.  Objective:  Vital signs in last 24 hours:  Blood pressure 103/68, pulse 75, temperature 97.8 F (36.6 C), temperature source Oral, resp. rate 18, height 6' (1.829 m), weight 226 lb (102.5 kg), SpO2 100 %.    HEENT: No thrush or ulcers Resp: Distant breath sounds with scattered inspiratory rhonchi, no respiratory distress Cardio: Distant heart sounds, regular rhythm GI: No hepatosplenomegaly, nontender Vascular: No leg edema  Skin: Dryness and mild hyperpigmentation of the soles  Portacath/PICC-without erythema  Lab Results:  Lab Results  Component Value Date   WBC 5.9 02/16/2021   HGB 11.8 (L) 02/16/2021   HCT 37.7 (L) 02/16/2021   MCV 95.4 02/16/2021   PLT 101 (L) 02/16/2021   NEUTROABS 4.0 02/16/2021    CMP  Lab Results  Component Value Date   NA 140 02/16/2021   K 3.8 02/16/2021   CL 103 02/16/2021   CO2 33 (H) 02/16/2021   GLUCOSE 161 (H) 02/16/2021   BUN 6 (L) 02/16/2021   CREATININE 1.06 02/16/2021   CALCIUM 8.9 02/16/2021   PROT 6.0 (L) 02/16/2021   ALBUMIN 3.6 02/16/2021   AST 20 02/16/2021   ALT 11 02/16/2021   ALKPHOS 82 02/16/2021   BILITOT 0.5 02/16/2021   GFRNONAA >60 02/16/2021   GFRAA >60 01/17/2017    Lab Results  Component Value Date   CEA 8.80 (H) 01/07/2021   Medications: I have reviewed the patient's current medications.   Assessment/Plan: Rectal cancer Mass at 6 cm on digital exam/anoscopy 08/25/2020 Colonoscopy 09/05/2020-mid rectal mass-biopsy, adenocarcinoma, no loss of mismatch repair protein expression CT abdomen/pelvis 08/29/2020-small amount of free fluid in the  pelvis, mildly enlarged left external iliac node, cystic changes of the left kidney with hydronephrosis and regions of cortical thinning with transition at the left UPJ suggesting chronic left UPJ obstruction MRI pelvis 09/24/2020-tumor 6 cm from the anal verge, 1.8 cm from the internal anal sphincter, T2, 7 mm lymph node in the left mesorectal sheath, N1 Cycle 1 FOLFOX 10/08/2020 Cycle 2 FOLFOX 10/22/2020 Cycle 3 FOLFOX 11/12/2020 (oxaliplatin dose reduced, white cell growth factor support on day of pump discontinuation) Cycle 4 FOLFOX 11/26/2020, 5-FU bolus and infusion dose reduced secondary to diarrhea, Ziextenzo Cycle 5 FOLFOX 12/10/2020, 5-FU bolus held due to diarrhea, Ziextenzo Cycle 6 FOLFOX 12/24/2020, 5-FU bolus held, Ziextenzo Cycle 7 FOLFOX 01/07/2021, oxaliplatin held due to neuropathy, 5-FU bolus held, Ziextenzo held Cycle 8 FOLFOX 01/21/2021, oxaliplatin held due to neuropathy, 5-FU bolus held, Ziextenzo held Radiation/Xeloda 02/02/2021     Diabetes Chronic kidney disease Bipolar disorder Hypertension Hyperlipidemia Restless leg syndrome Multiple colon polyps removed on the colonoscopy 09/05/2020-tubular adenomas including a rectal polyp with high-grade dysplasia-polypectomy margin negative for dysplasia Portacath placement, Dr Freida Busman, 10/02/2020    Disposition: William Good appears stable.  He is tolerating Xeloda and radiation well.  He will continue Imodium as needed for diarrhea.  I encouraged him to continue the potassium supplement.  He will eat frequent small meals.  We will ask radiation oncology to administer an influenza vaccine.  He will return for an office and lab visit on 02/27/2021.  Thornton Papas, MD  02/16/2021  3:09 PM

## 2021-02-17 ENCOUNTER — Other Ambulatory Visit: Payer: Self-pay

## 2021-02-17 ENCOUNTER — Ambulatory Visit
Admission: RE | Admit: 2021-02-17 | Discharge: 2021-02-17 | Disposition: A | Discharge status: Home / Self Care | Payer: Medicare Other | Source: Ambulatory Visit | Attending: Radiation Oncology | Admitting: Radiation Oncology

## 2021-02-17 DIAGNOSIS — I129 Hypertensive chronic kidney disease with stage 1 through stage 4 chronic kidney disease, or unspecified chronic kidney disease: Secondary | ICD-10-CM | POA: Diagnosis not present

## 2021-02-17 DIAGNOSIS — E1140 Type 2 diabetes mellitus with diabetic neuropathy, unspecified: Secondary | ICD-10-CM | POA: Diagnosis not present

## 2021-02-17 DIAGNOSIS — R197 Diarrhea, unspecified: Secondary | ICD-10-CM | POA: Diagnosis not present

## 2021-02-17 DIAGNOSIS — G2581 Restless legs syndrome: Secondary | ICD-10-CM | POA: Diagnosis not present

## 2021-02-17 DIAGNOSIS — N189 Chronic kidney disease, unspecified: Secondary | ICD-10-CM | POA: Diagnosis not present

## 2021-02-17 DIAGNOSIS — R110 Nausea: Secondary | ICD-10-CM | POA: Diagnosis not present

## 2021-02-17 DIAGNOSIS — Z510 Encounter for antineoplastic radiation therapy: Secondary | ICD-10-CM | POA: Diagnosis not present

## 2021-02-17 DIAGNOSIS — Z9221 Personal history of antineoplastic chemotherapy: Secondary | ICD-10-CM | POA: Diagnosis not present

## 2021-02-17 DIAGNOSIS — E1122 Type 2 diabetes mellitus with diabetic chronic kidney disease: Secondary | ICD-10-CM | POA: Diagnosis not present

## 2021-02-17 DIAGNOSIS — G629 Polyneuropathy, unspecified: Secondary | ICD-10-CM | POA: Diagnosis not present

## 2021-02-17 DIAGNOSIS — E785 Hyperlipidemia, unspecified: Secondary | ICD-10-CM | POA: Diagnosis not present

## 2021-02-17 DIAGNOSIS — C20 Malignant neoplasm of rectum: Secondary | ICD-10-CM | POA: Diagnosis not present

## 2021-02-18 ENCOUNTER — Ambulatory Visit
Admission: RE | Admit: 2021-02-18 | Discharge: 2021-02-18 | Disposition: A | Discharge status: Home / Self Care | Payer: Medicare Other | Source: Ambulatory Visit | Attending: Radiation Oncology | Admitting: Radiation Oncology

## 2021-02-18 DIAGNOSIS — I129 Hypertensive chronic kidney disease with stage 1 through stage 4 chronic kidney disease, or unspecified chronic kidney disease: Secondary | ICD-10-CM | POA: Diagnosis not present

## 2021-02-18 DIAGNOSIS — R110 Nausea: Secondary | ICD-10-CM | POA: Diagnosis not present

## 2021-02-18 DIAGNOSIS — C20 Malignant neoplasm of rectum: Secondary | ICD-10-CM | POA: Diagnosis not present

## 2021-02-18 DIAGNOSIS — G629 Polyneuropathy, unspecified: Secondary | ICD-10-CM | POA: Diagnosis not present

## 2021-02-18 DIAGNOSIS — E1140 Type 2 diabetes mellitus with diabetic neuropathy, unspecified: Secondary | ICD-10-CM | POA: Diagnosis not present

## 2021-02-18 DIAGNOSIS — E785 Hyperlipidemia, unspecified: Secondary | ICD-10-CM | POA: Diagnosis not present

## 2021-02-18 DIAGNOSIS — N189 Chronic kidney disease, unspecified: Secondary | ICD-10-CM | POA: Diagnosis not present

## 2021-02-18 DIAGNOSIS — Z510 Encounter for antineoplastic radiation therapy: Secondary | ICD-10-CM | POA: Diagnosis not present

## 2021-02-18 DIAGNOSIS — E1122 Type 2 diabetes mellitus with diabetic chronic kidney disease: Secondary | ICD-10-CM | POA: Diagnosis not present

## 2021-02-18 DIAGNOSIS — G2581 Restless legs syndrome: Secondary | ICD-10-CM | POA: Diagnosis not present

## 2021-02-18 DIAGNOSIS — Z9221 Personal history of antineoplastic chemotherapy: Secondary | ICD-10-CM | POA: Diagnosis not present

## 2021-02-18 DIAGNOSIS — R197 Diarrhea, unspecified: Secondary | ICD-10-CM | POA: Diagnosis not present

## 2021-02-19 ENCOUNTER — Other Ambulatory Visit: Payer: Self-pay

## 2021-02-19 ENCOUNTER — Ambulatory Visit
Admission: RE | Admit: 2021-02-19 | Discharge: 2021-02-19 | Disposition: A | Discharge status: Home / Self Care | Payer: Medicare Other | Source: Ambulatory Visit | Attending: Radiation Oncology | Admitting: Radiation Oncology

## 2021-02-19 DIAGNOSIS — G2581 Restless legs syndrome: Secondary | ICD-10-CM | POA: Diagnosis not present

## 2021-02-19 DIAGNOSIS — N189 Chronic kidney disease, unspecified: Secondary | ICD-10-CM | POA: Diagnosis not present

## 2021-02-19 DIAGNOSIS — I129 Hypertensive chronic kidney disease with stage 1 through stage 4 chronic kidney disease, or unspecified chronic kidney disease: Secondary | ICD-10-CM | POA: Diagnosis not present

## 2021-02-19 DIAGNOSIS — E785 Hyperlipidemia, unspecified: Secondary | ICD-10-CM | POA: Diagnosis not present

## 2021-02-19 DIAGNOSIS — E1122 Type 2 diabetes mellitus with diabetic chronic kidney disease: Secondary | ICD-10-CM | POA: Diagnosis not present

## 2021-02-19 DIAGNOSIS — Z9221 Personal history of antineoplastic chemotherapy: Secondary | ICD-10-CM | POA: Diagnosis not present

## 2021-02-19 DIAGNOSIS — R197 Diarrhea, unspecified: Secondary | ICD-10-CM | POA: Diagnosis not present

## 2021-02-19 DIAGNOSIS — G629 Polyneuropathy, unspecified: Secondary | ICD-10-CM | POA: Diagnosis not present

## 2021-02-19 DIAGNOSIS — C20 Malignant neoplasm of rectum: Secondary | ICD-10-CM | POA: Diagnosis not present

## 2021-02-19 DIAGNOSIS — E1140 Type 2 diabetes mellitus with diabetic neuropathy, unspecified: Secondary | ICD-10-CM | POA: Diagnosis not present

## 2021-02-19 DIAGNOSIS — Z510 Encounter for antineoplastic radiation therapy: Secondary | ICD-10-CM | POA: Diagnosis not present

## 2021-02-19 DIAGNOSIS — R110 Nausea: Secondary | ICD-10-CM | POA: Diagnosis not present

## 2021-02-20 ENCOUNTER — Ambulatory Visit
Admission: RE | Admit: 2021-02-20 | Discharge: 2021-02-20 | Disposition: A | Discharge status: Home / Self Care | Payer: Medicare Other | Source: Ambulatory Visit | Attending: Radiation Oncology | Admitting: Radiation Oncology

## 2021-02-20 DIAGNOSIS — E1140 Type 2 diabetes mellitus with diabetic neuropathy, unspecified: Secondary | ICD-10-CM | POA: Diagnosis not present

## 2021-02-20 DIAGNOSIS — Z9221 Personal history of antineoplastic chemotherapy: Secondary | ICD-10-CM | POA: Diagnosis not present

## 2021-02-20 DIAGNOSIS — E785 Hyperlipidemia, unspecified: Secondary | ICD-10-CM | POA: Diagnosis not present

## 2021-02-20 DIAGNOSIS — R197 Diarrhea, unspecified: Secondary | ICD-10-CM | POA: Diagnosis not present

## 2021-02-20 DIAGNOSIS — G2581 Restless legs syndrome: Secondary | ICD-10-CM | POA: Diagnosis not present

## 2021-02-20 DIAGNOSIS — C20 Malignant neoplasm of rectum: Secondary | ICD-10-CM | POA: Diagnosis not present

## 2021-02-20 DIAGNOSIS — G629 Polyneuropathy, unspecified: Secondary | ICD-10-CM | POA: Diagnosis not present

## 2021-02-20 DIAGNOSIS — E1122 Type 2 diabetes mellitus with diabetic chronic kidney disease: Secondary | ICD-10-CM | POA: Diagnosis not present

## 2021-02-20 DIAGNOSIS — Z510 Encounter for antineoplastic radiation therapy: Secondary | ICD-10-CM | POA: Diagnosis not present

## 2021-02-20 DIAGNOSIS — N189 Chronic kidney disease, unspecified: Secondary | ICD-10-CM | POA: Diagnosis not present

## 2021-02-20 DIAGNOSIS — I129 Hypertensive chronic kidney disease with stage 1 through stage 4 chronic kidney disease, or unspecified chronic kidney disease: Secondary | ICD-10-CM | POA: Diagnosis not present

## 2021-02-20 DIAGNOSIS — R110 Nausea: Secondary | ICD-10-CM | POA: Diagnosis not present

## 2021-02-23 ENCOUNTER — Ambulatory Visit
Admission: RE | Admit: 2021-02-23 | Discharge: 2021-02-23 | Disposition: A | Discharge status: Home / Self Care | Payer: Medicare Other | Source: Ambulatory Visit | Attending: Radiation Oncology | Admitting: Radiation Oncology

## 2021-02-23 ENCOUNTER — Other Ambulatory Visit: Payer: Self-pay

## 2021-02-23 DIAGNOSIS — Z9221 Personal history of antineoplastic chemotherapy: Secondary | ICD-10-CM | POA: Diagnosis not present

## 2021-02-23 DIAGNOSIS — E1122 Type 2 diabetes mellitus with diabetic chronic kidney disease: Secondary | ICD-10-CM | POA: Diagnosis not present

## 2021-02-23 DIAGNOSIS — R110 Nausea: Secondary | ICD-10-CM | POA: Diagnosis not present

## 2021-02-23 DIAGNOSIS — R197 Diarrhea, unspecified: Secondary | ICD-10-CM | POA: Diagnosis not present

## 2021-02-23 DIAGNOSIS — G629 Polyneuropathy, unspecified: Secondary | ICD-10-CM | POA: Diagnosis not present

## 2021-02-23 DIAGNOSIS — Z510 Encounter for antineoplastic radiation therapy: Secondary | ICD-10-CM | POA: Diagnosis not present

## 2021-02-23 DIAGNOSIS — C20 Malignant neoplasm of rectum: Secondary | ICD-10-CM | POA: Diagnosis not present

## 2021-02-23 DIAGNOSIS — G2581 Restless legs syndrome: Secondary | ICD-10-CM | POA: Diagnosis not present

## 2021-02-23 DIAGNOSIS — I129 Hypertensive chronic kidney disease with stage 1 through stage 4 chronic kidney disease, or unspecified chronic kidney disease: Secondary | ICD-10-CM | POA: Diagnosis not present

## 2021-02-23 DIAGNOSIS — N189 Chronic kidney disease, unspecified: Secondary | ICD-10-CM | POA: Diagnosis not present

## 2021-02-23 DIAGNOSIS — E1140 Type 2 diabetes mellitus with diabetic neuropathy, unspecified: Secondary | ICD-10-CM | POA: Diagnosis not present

## 2021-02-23 DIAGNOSIS — E785 Hyperlipidemia, unspecified: Secondary | ICD-10-CM | POA: Diagnosis not present

## 2021-02-24 ENCOUNTER — Ambulatory Visit
Admission: RE | Admit: 2021-02-24 | Discharge: 2021-02-24 | Disposition: A | Discharge status: Home / Self Care | Payer: Medicare Other | Source: Ambulatory Visit | Attending: Radiation Oncology | Admitting: Radiation Oncology

## 2021-02-24 DIAGNOSIS — Z510 Encounter for antineoplastic radiation therapy: Secondary | ICD-10-CM | POA: Diagnosis not present

## 2021-02-24 DIAGNOSIS — C20 Malignant neoplasm of rectum: Secondary | ICD-10-CM | POA: Diagnosis not present

## 2021-02-24 DIAGNOSIS — E785 Hyperlipidemia, unspecified: Secondary | ICD-10-CM | POA: Diagnosis not present

## 2021-02-24 DIAGNOSIS — G2581 Restless legs syndrome: Secondary | ICD-10-CM | POA: Diagnosis not present

## 2021-02-24 DIAGNOSIS — R110 Nausea: Secondary | ICD-10-CM | POA: Diagnosis not present

## 2021-02-24 DIAGNOSIS — N189 Chronic kidney disease, unspecified: Secondary | ICD-10-CM | POA: Diagnosis not present

## 2021-02-24 DIAGNOSIS — Z9221 Personal history of antineoplastic chemotherapy: Secondary | ICD-10-CM | POA: Diagnosis not present

## 2021-02-24 DIAGNOSIS — R197 Diarrhea, unspecified: Secondary | ICD-10-CM | POA: Diagnosis not present

## 2021-02-24 DIAGNOSIS — G629 Polyneuropathy, unspecified: Secondary | ICD-10-CM | POA: Diagnosis not present

## 2021-02-24 DIAGNOSIS — E1140 Type 2 diabetes mellitus with diabetic neuropathy, unspecified: Secondary | ICD-10-CM | POA: Diagnosis not present

## 2021-02-24 DIAGNOSIS — I129 Hypertensive chronic kidney disease with stage 1 through stage 4 chronic kidney disease, or unspecified chronic kidney disease: Secondary | ICD-10-CM | POA: Diagnosis not present

## 2021-02-24 DIAGNOSIS — E1122 Type 2 diabetes mellitus with diabetic chronic kidney disease: Secondary | ICD-10-CM | POA: Diagnosis not present

## 2021-02-25 ENCOUNTER — Other Ambulatory Visit: Payer: Self-pay

## 2021-02-25 ENCOUNTER — Ambulatory Visit
Admission: RE | Admit: 2021-02-25 | Discharge: 2021-02-25 | Disposition: A | Discharge status: Home / Self Care | Payer: Medicare Other | Source: Ambulatory Visit | Attending: Radiation Oncology | Admitting: Radiation Oncology

## 2021-02-25 DIAGNOSIS — G629 Polyneuropathy, unspecified: Secondary | ICD-10-CM | POA: Diagnosis not present

## 2021-02-25 DIAGNOSIS — R110 Nausea: Secondary | ICD-10-CM | POA: Diagnosis not present

## 2021-02-25 DIAGNOSIS — Z510 Encounter for antineoplastic radiation therapy: Secondary | ICD-10-CM | POA: Diagnosis not present

## 2021-02-25 DIAGNOSIS — E1122 Type 2 diabetes mellitus with diabetic chronic kidney disease: Secondary | ICD-10-CM | POA: Diagnosis not present

## 2021-02-25 DIAGNOSIS — E785 Hyperlipidemia, unspecified: Secondary | ICD-10-CM | POA: Diagnosis not present

## 2021-02-25 DIAGNOSIS — C20 Malignant neoplasm of rectum: Secondary | ICD-10-CM | POA: Diagnosis not present

## 2021-02-25 DIAGNOSIS — E1140 Type 2 diabetes mellitus with diabetic neuropathy, unspecified: Secondary | ICD-10-CM | POA: Diagnosis not present

## 2021-02-25 DIAGNOSIS — I129 Hypertensive chronic kidney disease with stage 1 through stage 4 chronic kidney disease, or unspecified chronic kidney disease: Secondary | ICD-10-CM | POA: Diagnosis not present

## 2021-02-25 DIAGNOSIS — G2581 Restless legs syndrome: Secondary | ICD-10-CM | POA: Diagnosis not present

## 2021-02-25 DIAGNOSIS — N189 Chronic kidney disease, unspecified: Secondary | ICD-10-CM | POA: Diagnosis not present

## 2021-02-25 DIAGNOSIS — R197 Diarrhea, unspecified: Secondary | ICD-10-CM | POA: Diagnosis not present

## 2021-02-25 DIAGNOSIS — Z9221 Personal history of antineoplastic chemotherapy: Secondary | ICD-10-CM | POA: Diagnosis not present

## 2021-02-26 ENCOUNTER — Other Ambulatory Visit: Payer: Self-pay

## 2021-02-26 ENCOUNTER — Ambulatory Visit
Admission: RE | Admit: 2021-02-26 | Discharge: 2021-02-26 | Disposition: A | Discharge status: Home / Self Care | Payer: Medicare Other | Source: Ambulatory Visit | Attending: Radiation Oncology | Admitting: Radiation Oncology

## 2021-02-26 DIAGNOSIS — E785 Hyperlipidemia, unspecified: Secondary | ICD-10-CM | POA: Diagnosis not present

## 2021-02-26 DIAGNOSIS — G629 Polyneuropathy, unspecified: Secondary | ICD-10-CM | POA: Diagnosis not present

## 2021-02-26 DIAGNOSIS — E1140 Type 2 diabetes mellitus with diabetic neuropathy, unspecified: Secondary | ICD-10-CM | POA: Diagnosis not present

## 2021-02-26 DIAGNOSIS — G2581 Restless legs syndrome: Secondary | ICD-10-CM | POA: Diagnosis not present

## 2021-02-26 DIAGNOSIS — R110 Nausea: Secondary | ICD-10-CM | POA: Diagnosis not present

## 2021-02-26 DIAGNOSIS — N189 Chronic kidney disease, unspecified: Secondary | ICD-10-CM | POA: Diagnosis not present

## 2021-02-26 DIAGNOSIS — C20 Malignant neoplasm of rectum: Secondary | ICD-10-CM | POA: Diagnosis not present

## 2021-02-26 DIAGNOSIS — I129 Hypertensive chronic kidney disease with stage 1 through stage 4 chronic kidney disease, or unspecified chronic kidney disease: Secondary | ICD-10-CM | POA: Diagnosis not present

## 2021-02-26 DIAGNOSIS — R197 Diarrhea, unspecified: Secondary | ICD-10-CM | POA: Diagnosis not present

## 2021-02-26 DIAGNOSIS — Z9221 Personal history of antineoplastic chemotherapy: Secondary | ICD-10-CM | POA: Diagnosis not present

## 2021-02-26 DIAGNOSIS — E1122 Type 2 diabetes mellitus with diabetic chronic kidney disease: Secondary | ICD-10-CM | POA: Diagnosis not present

## 2021-02-26 DIAGNOSIS — Z510 Encounter for antineoplastic radiation therapy: Secondary | ICD-10-CM | POA: Diagnosis not present

## 2021-02-27 ENCOUNTER — Ambulatory Visit
Admission: RE | Admit: 2021-02-27 | Discharge: 2021-02-27 | Disposition: A | Discharge status: Home / Self Care | Payer: Medicare Other | Source: Ambulatory Visit | Attending: Radiation Oncology | Admitting: Radiation Oncology

## 2021-02-27 ENCOUNTER — Inpatient Hospital Stay: Payer: Medicare Other

## 2021-02-27 ENCOUNTER — Encounter: Payer: Self-pay | Admitting: Nurse Practitioner

## 2021-02-27 ENCOUNTER — Inpatient Hospital Stay: Payer: Medicare Other | Admitting: Nurse Practitioner

## 2021-02-27 VITALS — BP 114/76 | HR 76 | Temp 98.1°F | Resp 18 | Ht 72.0 in | Wt 227.4 lb

## 2021-02-27 DIAGNOSIS — C20 Malignant neoplasm of rectum: Secondary | ICD-10-CM

## 2021-02-27 DIAGNOSIS — R197 Diarrhea, unspecified: Secondary | ICD-10-CM | POA: Diagnosis not present

## 2021-02-27 DIAGNOSIS — E1122 Type 2 diabetes mellitus with diabetic chronic kidney disease: Secondary | ICD-10-CM | POA: Diagnosis not present

## 2021-02-27 DIAGNOSIS — E785 Hyperlipidemia, unspecified: Secondary | ICD-10-CM | POA: Diagnosis not present

## 2021-02-27 DIAGNOSIS — Z95828 Presence of other vascular implants and grafts: Secondary | ICD-10-CM

## 2021-02-27 DIAGNOSIS — G2581 Restless legs syndrome: Secondary | ICD-10-CM | POA: Diagnosis not present

## 2021-02-27 DIAGNOSIS — I129 Hypertensive chronic kidney disease with stage 1 through stage 4 chronic kidney disease, or unspecified chronic kidney disease: Secondary | ICD-10-CM | POA: Diagnosis not present

## 2021-02-27 DIAGNOSIS — Z9221 Personal history of antineoplastic chemotherapy: Secondary | ICD-10-CM | POA: Diagnosis not present

## 2021-02-27 DIAGNOSIS — E1140 Type 2 diabetes mellitus with diabetic neuropathy, unspecified: Secondary | ICD-10-CM | POA: Diagnosis not present

## 2021-02-27 DIAGNOSIS — N189 Chronic kidney disease, unspecified: Secondary | ICD-10-CM | POA: Diagnosis not present

## 2021-02-27 DIAGNOSIS — Z510 Encounter for antineoplastic radiation therapy: Secondary | ICD-10-CM | POA: Diagnosis not present

## 2021-02-27 DIAGNOSIS — G629 Polyneuropathy, unspecified: Secondary | ICD-10-CM | POA: Diagnosis not present

## 2021-02-27 DIAGNOSIS — R110 Nausea: Secondary | ICD-10-CM | POA: Diagnosis not present

## 2021-02-27 LAB — CBC WITH DIFFERENTIAL (CANCER CENTER ONLY)
Abs Immature Granulocytes: 0.02 10*3/uL (ref 0.00–0.07)
Basophils Absolute: 0 10*3/uL (ref 0.0–0.1)
Basophils Relative: 0 %
Eosinophils Absolute: 0.2 10*3/uL (ref 0.0–0.5)
Eosinophils Relative: 4 %
HCT: 38.3 % — ABNORMAL LOW (ref 39.0–52.0)
Hemoglobin: 12.2 g/dL — ABNORMAL LOW (ref 13.0–17.0)
Immature Granulocytes: 0 %
Lymphocytes Relative: 15 %
Lymphs Abs: 0.7 10*3/uL (ref 0.7–4.0)
MCH: 31.1 pg (ref 26.0–34.0)
MCHC: 31.9 g/dL (ref 30.0–36.0)
MCV: 97.7 fL (ref 80.0–100.0)
Monocytes Absolute: 0.4 10*3/uL (ref 0.1–1.0)
Monocytes Relative: 8 %
Neutro Abs: 3.6 10*3/uL (ref 1.7–7.7)
Neutrophils Relative %: 73 %
Platelet Count: 111 10*3/uL — ABNORMAL LOW (ref 150–400)
RBC: 3.92 MIL/uL — ABNORMAL LOW (ref 4.22–5.81)
RDW: 22.8 % — ABNORMAL HIGH (ref 11.5–15.5)
WBC Count: 5 10*3/uL (ref 4.0–10.5)
nRBC: 0 % (ref 0.0–0.2)

## 2021-02-27 LAB — CMP (CANCER CENTER ONLY)
ALT: 9 U/L (ref 0–44)
AST: 21 U/L (ref 15–41)
Albumin: 3.7 g/dL (ref 3.5–5.0)
Alkaline Phosphatase: 84 U/L (ref 38–126)
Anion gap: 5 (ref 5–15)
BUN: 8 mg/dL (ref 8–23)
CO2: 30 mmol/L (ref 22–32)
Calcium: 8.8 mg/dL — ABNORMAL LOW (ref 8.9–10.3)
Chloride: 104 mmol/L (ref 98–111)
Creatinine: 1 mg/dL (ref 0.61–1.24)
GFR, Estimated: >60 mL/min (ref >60)
Glucose, Bld: 152 mg/dL — ABNORMAL HIGH (ref 70–99)
Potassium: 4 mmol/L (ref 3.5–5.1)
Sodium: 139 mmol/L (ref 135–145)
Total Bilirubin: 0.6 mg/dL (ref 0.3–1.2)
Total Protein: 5.9 g/dL — ABNORMAL LOW (ref 6.5–8.1)

## 2021-02-27 LAB — MAGNESIUM: Magnesium: 1.7 mg/dL (ref 1.7–2.4)

## 2021-02-27 MED ORDER — HEPARIN SOD (PORK) LOCK FLUSH 100 UNIT/ML IV SOLN
500.0000 [IU] | Freq: Once | INTRAVENOUS | Status: AC
  Administered 2021-02-27: 14:00:00 500 [IU] via INTRAVENOUS

## 2021-02-27 MED ORDER — SODIUM CHLORIDE 0.9% FLUSH
10.0000 mL | INTRAVENOUS | Status: DC | PRN
  Administered 2021-02-27: 14:00:00 10 mL via INTRAVENOUS

## 2021-02-27 NOTE — Patient Instructions (Signed)

## 2021-02-27 NOTE — Progress Notes (Signed)
[facility] OFFICE PROGRESS NOTE   Diagnosis: Rectal cancer  INTERVAL HISTORY:   [name] returns as scheduled.  He continues Xeloda and radiation.  He has mild intermittent nausea.  No mouth sores.  No diarrhea for the past 2 weeks.  No hand or foot pain or redness.  No neuropathy symptoms.  Objective:  Vital signs in last 24 hours:  Blood pressure 114/76, pulse 76, temperature 98.1 F (36.7 C), resp. rate 18, height 6' (1.829 m), weight 227 lb 6.4 oz (103.1 kg), SpO2 98 %.    HEENT: No thrush or ulcers. Resp: Lungs clear bilaterally. Cardio: Regular rate and rhythm. GI: Abdomen soft and nontender.  No hepatomegaly. Vascular: No leg edema. Skin: Palms without erythema. Port-A-Cath without erythema.   Lab Results:  Lab Results  Component Value Date   WBC 5.9 02/16/2021   HGB 11.8 (L) 02/16/2021   HCT 37.7 (L) 02/16/2021   MCV 95.4 02/16/2021   PLT 101 (L) 02/16/2021   NEUTROABS 4.0 02/16/2021    Imaging:  No results found.  Medications: I have reviewed the patient's current medications.  Assessment/Plan: Rectal cancer Mass at 6 cm on digital exam/anoscopy 08/25/2020 Colonoscopy 09/05/2020-mid rectal mass-biopsy, adenocarcinoma, no loss of mismatch repair protein expression CT abdomen/pelvis 08/29/2020-small amount of free fluid in the pelvis, mildly enlarged left external iliac node, cystic changes of the left kidney with hydronephrosis and regions of cortical thinning with transition at the left UPJ suggesting chronic left UPJ obstruction MRI pelvis 09/24/2020-tumor 6 cm from the anal verge, 1.8 cm from the internal anal sphincter, T2, 7 mm lymph node in the left mesorectal sheath, N1 Cycle 1 FOLFOX 10/08/2020 Cycle 2 FOLFOX 10/22/2020 Cycle 3 FOLFOX 11/12/2020 (oxaliplatin dose reduced, white cell growth factor support on day of pump discontinuation) Cycle 4 FOLFOX 11/26/2020, 5-FU bolus and infusion dose reduced secondary to diarrhea,  Ziextenzo Cycle 5 FOLFOX 12/10/2020, 5-FU bolus held due to diarrhea, Ziextenzo Cycle 6 FOLFOX 12/24/2020, 5-FU bolus held, Ziextenzo Cycle 7 FOLFOX 01/07/2021, oxaliplatin held due to neuropathy, 5-FU bolus held, Ziextenzo held Cycle 8 FOLFOX 01/21/2021, oxaliplatin held due to neuropathy, 5-FU bolus held, Ziextenzo held Radiation/Xeloda 02/02/2021     Diabetes Chronic kidney disease Bipolar disorder Hypertension Hyperlipidemia Restless leg syndrome Multiple colon polyps removed on the colonoscopy 09/05/2020-tubular adenomas including a rectal polyp with high-grade dysplasia-polypectomy margin negative for dysplasia Portacath placement, Dr Zenia Resides, 10/02/2020    Disposition: [name] appears stable.  He continues radiation/Xeloda.  He is scheduled to complete radiation 03/16/2020.  CBC and chemistry panel reviewed.  Labs adequate to continue with Xeloda.  We will see him in follow-up on 03/16/2020.    Ned Card ANP/GNP-BC   02/27/2021  1:42 PM

## 2021-03-02 DIAGNOSIS — R197 Diarrhea, unspecified: Secondary | ICD-10-CM | POA: Diagnosis not present

## 2021-03-02 DIAGNOSIS — C20 Malignant neoplasm of rectum: Secondary | ICD-10-CM | POA: Diagnosis not present

## 2021-03-02 DIAGNOSIS — E785 Hyperlipidemia, unspecified: Secondary | ICD-10-CM | POA: Diagnosis not present

## 2021-03-02 DIAGNOSIS — G2581 Restless legs syndrome: Secondary | ICD-10-CM | POA: Diagnosis not present

## 2021-03-02 DIAGNOSIS — E1122 Type 2 diabetes mellitus with diabetic chronic kidney disease: Secondary | ICD-10-CM | POA: Diagnosis not present

## 2021-03-02 DIAGNOSIS — E1140 Type 2 diabetes mellitus with diabetic neuropathy, unspecified: Secondary | ICD-10-CM | POA: Diagnosis not present

## 2021-03-02 DIAGNOSIS — N189 Chronic kidney disease, unspecified: Secondary | ICD-10-CM | POA: Diagnosis not present

## 2021-03-02 DIAGNOSIS — G629 Polyneuropathy, unspecified: Secondary | ICD-10-CM | POA: Diagnosis not present

## 2021-03-02 DIAGNOSIS — Z9221 Personal history of antineoplastic chemotherapy: Secondary | ICD-10-CM | POA: Diagnosis not present

## 2021-03-02 DIAGNOSIS — Z510 Encounter for antineoplastic radiation therapy: Secondary | ICD-10-CM | POA: Diagnosis not present

## 2021-03-02 DIAGNOSIS — I129 Hypertensive chronic kidney disease with stage 1 through stage 4 chronic kidney disease, or unspecified chronic kidney disease: Secondary | ICD-10-CM | POA: Diagnosis not present

## 2021-03-02 DIAGNOSIS — R110 Nausea: Secondary | ICD-10-CM | POA: Diagnosis not present

## 2021-03-03 ENCOUNTER — Encounter: Payer: Self-pay | Admitting: *Deleted

## 2021-03-03 ENCOUNTER — Other Ambulatory Visit: Payer: Self-pay

## 2021-03-03 ENCOUNTER — Ambulatory Visit
Admission: RE | Admit: 2021-03-03 | Discharge: 2021-03-03 | Disposition: A | Discharge status: Home / Self Care | Payer: Medicare Other | Source: Ambulatory Visit | Attending: Radiation Oncology | Admitting: Radiation Oncology

## 2021-03-03 DIAGNOSIS — R110 Nausea: Secondary | ICD-10-CM | POA: Diagnosis not present

## 2021-03-03 DIAGNOSIS — G629 Polyneuropathy, unspecified: Secondary | ICD-10-CM | POA: Diagnosis not present

## 2021-03-03 DIAGNOSIS — R197 Diarrhea, unspecified: Secondary | ICD-10-CM | POA: Diagnosis not present

## 2021-03-03 DIAGNOSIS — E1140 Type 2 diabetes mellitus with diabetic neuropathy, unspecified: Secondary | ICD-10-CM | POA: Diagnosis not present

## 2021-03-03 DIAGNOSIS — C20 Malignant neoplasm of rectum: Secondary | ICD-10-CM | POA: Diagnosis not present

## 2021-03-03 DIAGNOSIS — Z510 Encounter for antineoplastic radiation therapy: Secondary | ICD-10-CM | POA: Diagnosis not present

## 2021-03-03 DIAGNOSIS — Z9221 Personal history of antineoplastic chemotherapy: Secondary | ICD-10-CM | POA: Diagnosis not present

## 2021-03-03 DIAGNOSIS — G2581 Restless legs syndrome: Secondary | ICD-10-CM | POA: Diagnosis not present

## 2021-03-03 DIAGNOSIS — E1122 Type 2 diabetes mellitus with diabetic chronic kidney disease: Secondary | ICD-10-CM | POA: Diagnosis not present

## 2021-03-03 DIAGNOSIS — E785 Hyperlipidemia, unspecified: Secondary | ICD-10-CM | POA: Diagnosis not present

## 2021-03-03 DIAGNOSIS — N189 Chronic kidney disease, unspecified: Secondary | ICD-10-CM | POA: Diagnosis not present

## 2021-03-03 DIAGNOSIS — I129 Hypertensive chronic kidney disease with stage 1 through stage 4 chronic kidney disease, or unspecified chronic kidney disease: Secondary | ICD-10-CM | POA: Diagnosis not present

## 2021-03-03 NOTE — Progress Notes (Signed)
Faxed referral order requesting f/u appointment for Dr. Johney Maine at [facility]. Completes all neoadjuvant therapy on 03/16/21.

## 2021-03-04 ENCOUNTER — Ambulatory Visit
Admission: RE | Admit: 2021-03-04 | Discharge: 2021-03-04 | Disposition: A | Discharge status: Home / Self Care | Payer: Medicare Other | Source: Ambulatory Visit | Attending: Radiation Oncology | Admitting: Radiation Oncology

## 2021-03-04 DIAGNOSIS — Z510 Encounter for antineoplastic radiation therapy: Secondary | ICD-10-CM | POA: Diagnosis not present

## 2021-03-04 DIAGNOSIS — G629 Polyneuropathy, unspecified: Secondary | ICD-10-CM | POA: Diagnosis not present

## 2021-03-04 DIAGNOSIS — N189 Chronic kidney disease, unspecified: Secondary | ICD-10-CM | POA: Diagnosis not present

## 2021-03-04 DIAGNOSIS — G2581 Restless legs syndrome: Secondary | ICD-10-CM | POA: Diagnosis not present

## 2021-03-04 DIAGNOSIS — R197 Diarrhea, unspecified: Secondary | ICD-10-CM | POA: Diagnosis not present

## 2021-03-04 DIAGNOSIS — E785 Hyperlipidemia, unspecified: Secondary | ICD-10-CM | POA: Diagnosis not present

## 2021-03-04 DIAGNOSIS — E1122 Type 2 diabetes mellitus with diabetic chronic kidney disease: Secondary | ICD-10-CM | POA: Diagnosis not present

## 2021-03-04 DIAGNOSIS — E1140 Type 2 diabetes mellitus with diabetic neuropathy, unspecified: Secondary | ICD-10-CM | POA: Diagnosis not present

## 2021-03-04 DIAGNOSIS — C20 Malignant neoplasm of rectum: Secondary | ICD-10-CM | POA: Diagnosis not present

## 2021-03-04 DIAGNOSIS — Z9221 Personal history of antineoplastic chemotherapy: Secondary | ICD-10-CM | POA: Diagnosis not present

## 2021-03-04 DIAGNOSIS — I129 Hypertensive chronic kidney disease with stage 1 through stage 4 chronic kidney disease, or unspecified chronic kidney disease: Secondary | ICD-10-CM | POA: Diagnosis not present

## 2021-03-04 DIAGNOSIS — R110 Nausea: Secondary | ICD-10-CM | POA: Diagnosis not present

## 2021-03-05 ENCOUNTER — Ambulatory Visit
Admission: RE | Admit: 2021-03-05 | Discharge: 2021-03-05 | Disposition: A | Discharge status: Home / Self Care | Payer: Medicare Other | Source: Ambulatory Visit | Attending: Radiation Oncology | Admitting: Radiation Oncology

## 2021-03-05 ENCOUNTER — Other Ambulatory Visit: Payer: Self-pay

## 2021-03-05 DIAGNOSIS — E1140 Type 2 diabetes mellitus with diabetic neuropathy, unspecified: Secondary | ICD-10-CM | POA: Diagnosis not present

## 2021-03-05 DIAGNOSIS — G2581 Restless legs syndrome: Secondary | ICD-10-CM | POA: Diagnosis not present

## 2021-03-05 DIAGNOSIS — I129 Hypertensive chronic kidney disease with stage 1 through stage 4 chronic kidney disease, or unspecified chronic kidney disease: Secondary | ICD-10-CM | POA: Diagnosis not present

## 2021-03-05 DIAGNOSIS — K219 Gastro-esophageal reflux disease without esophagitis: Secondary | ICD-10-CM | POA: Diagnosis not present

## 2021-03-05 DIAGNOSIS — C20 Malignant neoplasm of rectum: Secondary | ICD-10-CM | POA: Diagnosis not present

## 2021-03-05 DIAGNOSIS — E1159 Type 2 diabetes mellitus with other circulatory complications: Secondary | ICD-10-CM | POA: Diagnosis not present

## 2021-03-05 DIAGNOSIS — N183 Chronic kidney disease, stage 3 unspecified: Secondary | ICD-10-CM | POA: Diagnosis not present

## 2021-03-05 DIAGNOSIS — R197 Diarrhea, unspecified: Secondary | ICD-10-CM | POA: Diagnosis not present

## 2021-03-05 DIAGNOSIS — R110 Nausea: Secondary | ICD-10-CM | POA: Diagnosis not present

## 2021-03-05 DIAGNOSIS — E785 Hyperlipidemia, unspecified: Secondary | ICD-10-CM | POA: Diagnosis not present

## 2021-03-05 DIAGNOSIS — F17200 Nicotine dependence, unspecified, uncomplicated: Secondary | ICD-10-CM | POA: Diagnosis not present

## 2021-03-05 DIAGNOSIS — Z9221 Personal history of antineoplastic chemotherapy: Secondary | ICD-10-CM | POA: Diagnosis not present

## 2021-03-05 DIAGNOSIS — N189 Chronic kidney disease, unspecified: Secondary | ICD-10-CM | POA: Diagnosis not present

## 2021-03-05 DIAGNOSIS — Z510 Encounter for antineoplastic radiation therapy: Secondary | ICD-10-CM | POA: Diagnosis not present

## 2021-03-05 DIAGNOSIS — G43009 Migraine without aura, not intractable, without status migrainosus: Secondary | ICD-10-CM | POA: Diagnosis not present

## 2021-03-05 DIAGNOSIS — G629 Polyneuropathy, unspecified: Secondary | ICD-10-CM | POA: Diagnosis not present

## 2021-03-05 DIAGNOSIS — I10 Essential (primary) hypertension: Secondary | ICD-10-CM | POA: Diagnosis not present

## 2021-03-05 DIAGNOSIS — E1122 Type 2 diabetes mellitus with diabetic chronic kidney disease: Secondary | ICD-10-CM | POA: Diagnosis not present

## 2021-03-05 DIAGNOSIS — Z23 Encounter for immunization: Secondary | ICD-10-CM | POA: Diagnosis not present

## 2021-03-06 ENCOUNTER — Ambulatory Visit
Admission: RE | Admit: 2021-03-06 | Discharge: 2021-03-06 | Disposition: A | Discharge status: Home / Self Care | Payer: Medicare Other | Source: Ambulatory Visit | Attending: Radiation Oncology | Admitting: Radiation Oncology

## 2021-03-06 DIAGNOSIS — C20 Malignant neoplasm of rectum: Secondary | ICD-10-CM | POA: Diagnosis not present

## 2021-03-06 DIAGNOSIS — I129 Hypertensive chronic kidney disease with stage 1 through stage 4 chronic kidney disease, or unspecified chronic kidney disease: Secondary | ICD-10-CM | POA: Diagnosis not present

## 2021-03-06 DIAGNOSIS — E1122 Type 2 diabetes mellitus with diabetic chronic kidney disease: Secondary | ICD-10-CM | POA: Diagnosis not present

## 2021-03-06 DIAGNOSIS — Z510 Encounter for antineoplastic radiation therapy: Secondary | ICD-10-CM | POA: Diagnosis not present

## 2021-03-06 DIAGNOSIS — R110 Nausea: Secondary | ICD-10-CM | POA: Diagnosis not present

## 2021-03-06 DIAGNOSIS — E1140 Type 2 diabetes mellitus with diabetic neuropathy, unspecified: Secondary | ICD-10-CM | POA: Diagnosis not present

## 2021-03-06 DIAGNOSIS — Z9221 Personal history of antineoplastic chemotherapy: Secondary | ICD-10-CM | POA: Diagnosis not present

## 2021-03-06 DIAGNOSIS — N189 Chronic kidney disease, unspecified: Secondary | ICD-10-CM | POA: Diagnosis not present

## 2021-03-06 DIAGNOSIS — G629 Polyneuropathy, unspecified: Secondary | ICD-10-CM | POA: Diagnosis not present

## 2021-03-06 DIAGNOSIS — G2581 Restless legs syndrome: Secondary | ICD-10-CM | POA: Diagnosis not present

## 2021-03-06 DIAGNOSIS — R197 Diarrhea, unspecified: Secondary | ICD-10-CM | POA: Diagnosis not present

## 2021-03-06 DIAGNOSIS — E785 Hyperlipidemia, unspecified: Secondary | ICD-10-CM | POA: Diagnosis not present

## 2021-03-10 ENCOUNTER — Other Ambulatory Visit: Payer: Self-pay

## 2021-03-10 ENCOUNTER — Ambulatory Visit
Admission: RE | Admit: 2021-03-10 | Discharge: 2021-03-10 | Disposition: A | Discharge status: Home / Self Care | Payer: Medicare Other | Source: Ambulatory Visit | Attending: Radiation Oncology | Admitting: Radiation Oncology

## 2021-03-10 DIAGNOSIS — C20 Malignant neoplasm of rectum: Secondary | ICD-10-CM | POA: Insufficient documentation

## 2021-03-10 DIAGNOSIS — Z510 Encounter for antineoplastic radiation therapy: Secondary | ICD-10-CM | POA: Insufficient documentation

## 2021-03-10 DIAGNOSIS — G629 Polyneuropathy, unspecified: Secondary | ICD-10-CM | POA: Insufficient documentation

## 2021-03-10 DIAGNOSIS — Z9221 Personal history of antineoplastic chemotherapy: Secondary | ICD-10-CM | POA: Diagnosis not present

## 2021-03-11 ENCOUNTER — Ambulatory Visit
Admission: RE | Admit: 2021-03-11 | Discharge: 2021-03-11 | Disposition: A | Discharge status: Home / Self Care | Payer: Medicare Other | Source: Ambulatory Visit | Attending: Radiation Oncology | Admitting: Radiation Oncology

## 2021-03-11 ENCOUNTER — Ambulatory Visit: Payer: Medicare Other

## 2021-03-11 DIAGNOSIS — Z510 Encounter for antineoplastic radiation therapy: Secondary | ICD-10-CM | POA: Diagnosis not present

## 2021-03-11 DIAGNOSIS — G629 Polyneuropathy, unspecified: Secondary | ICD-10-CM | POA: Diagnosis not present

## 2021-03-11 DIAGNOSIS — C20 Malignant neoplasm of rectum: Secondary | ICD-10-CM | POA: Diagnosis not present

## 2021-03-11 DIAGNOSIS — Z9221 Personal history of antineoplastic chemotherapy: Secondary | ICD-10-CM | POA: Diagnosis not present

## 2021-03-12 ENCOUNTER — Ambulatory Visit: Payer: Medicare Other

## 2021-03-12 ENCOUNTER — Ambulatory Visit
Admission: RE | Admit: 2021-03-12 | Discharge: 2021-03-12 | Disposition: A | Discharge status: Home / Self Care | Payer: Medicare Other | Source: Ambulatory Visit | Attending: Radiation Oncology | Admitting: Radiation Oncology

## 2021-03-12 ENCOUNTER — Other Ambulatory Visit: Payer: Self-pay

## 2021-03-12 DIAGNOSIS — Z510 Encounter for antineoplastic radiation therapy: Secondary | ICD-10-CM | POA: Diagnosis not present

## 2021-03-12 DIAGNOSIS — G629 Polyneuropathy, unspecified: Secondary | ICD-10-CM | POA: Diagnosis not present

## 2021-03-12 DIAGNOSIS — C20 Malignant neoplasm of rectum: Secondary | ICD-10-CM | POA: Diagnosis not present

## 2021-03-12 DIAGNOSIS — Z9221 Personal history of antineoplastic chemotherapy: Secondary | ICD-10-CM | POA: Diagnosis not present

## 2021-03-13 ENCOUNTER — Ambulatory Visit
Admission: RE | Admit: 2021-03-13 | Discharge: 2021-03-13 | Disposition: A | Discharge status: Home / Self Care | Payer: Medicare Other | Source: Ambulatory Visit | Attending: Radiation Oncology | Admitting: Radiation Oncology

## 2021-03-13 ENCOUNTER — Ambulatory Visit: Payer: Medicare Other

## 2021-03-13 DIAGNOSIS — C20 Malignant neoplasm of rectum: Secondary | ICD-10-CM | POA: Diagnosis not present

## 2021-03-13 DIAGNOSIS — G629 Polyneuropathy, unspecified: Secondary | ICD-10-CM | POA: Diagnosis not present

## 2021-03-13 DIAGNOSIS — Z510 Encounter for antineoplastic radiation therapy: Secondary | ICD-10-CM | POA: Diagnosis not present

## 2021-03-13 DIAGNOSIS — Z9221 Personal history of antineoplastic chemotherapy: Secondary | ICD-10-CM | POA: Diagnosis not present

## 2021-03-16 ENCOUNTER — Encounter: Payer: Self-pay | Admitting: Nurse Practitioner

## 2021-03-16 ENCOUNTER — Inpatient Hospital Stay: Payer: Medicare Other | Admitting: Nurse Practitioner

## 2021-03-16 ENCOUNTER — Encounter: Payer: Self-pay | Admitting: Radiation Oncology

## 2021-03-16 ENCOUNTER — Ambulatory Visit
Admission: RE | Admit: 2021-03-16 | Discharge: 2021-03-16 | Disposition: A | Discharge status: Home / Self Care | Payer: Medicare Other | Source: Ambulatory Visit | Attending: Radiation Oncology | Admitting: Radiation Oncology

## 2021-03-16 ENCOUNTER — Inpatient Hospital Stay: Payer: Medicare Other

## 2021-03-16 ENCOUNTER — Other Ambulatory Visit: Payer: Self-pay

## 2021-03-16 VITALS — BP 108/69 | HR 76 | Temp 97.8°F | Resp 20 | Ht 72.0 in | Wt 224.8 lb

## 2021-03-16 DIAGNOSIS — F319 Bipolar disorder, unspecified: Secondary | ICD-10-CM | POA: Insufficient documentation

## 2021-03-16 DIAGNOSIS — N189 Chronic kidney disease, unspecified: Secondary | ICD-10-CM | POA: Insufficient documentation

## 2021-03-16 DIAGNOSIS — G2581 Restless legs syndrome: Secondary | ICD-10-CM | POA: Insufficient documentation

## 2021-03-16 DIAGNOSIS — E1122 Type 2 diabetes mellitus with diabetic chronic kidney disease: Secondary | ICD-10-CM | POA: Insufficient documentation

## 2021-03-16 DIAGNOSIS — Z95828 Presence of other vascular implants and grafts: Secondary | ICD-10-CM

## 2021-03-16 DIAGNOSIS — C20 Malignant neoplasm of rectum: Secondary | ICD-10-CM | POA: Insufficient documentation

## 2021-03-16 DIAGNOSIS — Z510 Encounter for antineoplastic radiation therapy: Secondary | ICD-10-CM | POA: Diagnosis not present

## 2021-03-16 DIAGNOSIS — I129 Hypertensive chronic kidney disease with stage 1 through stage 4 chronic kidney disease, or unspecified chronic kidney disease: Secondary | ICD-10-CM | POA: Insufficient documentation

## 2021-03-16 DIAGNOSIS — Z9221 Personal history of antineoplastic chemotherapy: Secondary | ICD-10-CM | POA: Diagnosis not present

## 2021-03-16 DIAGNOSIS — E785 Hyperlipidemia, unspecified: Secondary | ICD-10-CM | POA: Insufficient documentation

## 2021-03-16 DIAGNOSIS — G629 Polyneuropathy, unspecified: Secondary | ICD-10-CM | POA: Diagnosis not present

## 2021-03-16 LAB — CBC WITH DIFFERENTIAL (CANCER CENTER ONLY)
Abs Immature Granulocytes: 0.02 10*3/uL (ref 0.00–0.07)
Basophils Absolute: 0 10*3/uL (ref 0.0–0.1)
Basophils Relative: 0 %
Eosinophils Absolute: 0.1 10*3/uL (ref 0.0–0.5)
Eosinophils Relative: 3 %
HCT: 36.9 % — ABNORMAL LOW (ref 39.0–52.0)
Hemoglobin: 12.2 g/dL — ABNORMAL LOW (ref 13.0–17.0)
Immature Granulocytes: 1 %
Lymphocytes Relative: 16 %
Lymphs Abs: 0.7 10*3/uL (ref 0.7–4.0)
MCH: 33 pg (ref 26.0–34.0)
MCHC: 33.1 g/dL (ref 30.0–36.0)
MCV: 99.7 fL (ref 80.0–100.0)
Monocytes Absolute: 0.3 10*3/uL (ref 0.1–1.0)
Monocytes Relative: 7 %
Neutro Abs: 3.2 10*3/uL (ref 1.7–7.7)
Neutrophils Relative %: 73 %
Platelet Count: 100 10*3/uL — ABNORMAL LOW (ref 150–400)
RBC: 3.7 MIL/uL — ABNORMAL LOW (ref 4.22–5.81)
RDW: 22.2 % — ABNORMAL HIGH (ref 11.5–15.5)
WBC Count: 4.3 10*3/uL (ref 4.0–10.5)
nRBC: 0 % (ref 0.0–0.2)

## 2021-03-16 LAB — CMP (CANCER CENTER ONLY)
ALT: 7 U/L (ref 0–44)
AST: 16 U/L (ref 15–41)
Albumin: 3.5 g/dL (ref 3.5–5.0)
Alkaline Phosphatase: 77 U/L (ref 38–126)
Anion gap: 6 (ref 5–15)
BUN: 5 mg/dL — ABNORMAL LOW (ref 8–23)
CO2: 30 mmol/L (ref 22–32)
Calcium: 8.5 mg/dL — ABNORMAL LOW (ref 8.9–10.3)
Chloride: 104 mmol/L (ref 98–111)
Creatinine: 0.98 mg/dL (ref 0.61–1.24)
GFR, Estimated: >60 mL/min (ref >60)
Glucose, Bld: 175 mg/dL — ABNORMAL HIGH (ref 70–99)
Potassium: 3.7 mmol/L (ref 3.5–5.1)
Sodium: 140 mmol/L (ref 135–145)
Total Bilirubin: 0.4 mg/dL (ref 0.3–1.2)
Total Protein: 5.9 g/dL — ABNORMAL LOW (ref 6.5–8.1)

## 2021-03-16 LAB — MAGNESIUM: Magnesium: 1.6 mg/dL — ABNORMAL LOW (ref 1.7–2.4)

## 2021-03-16 MED ORDER — HEPARIN SOD (PORK) LOCK FLUSH 100 UNIT/ML IV SOLN
500.0000 [IU] | Freq: Once | INTRAVENOUS | Status: AC
  Administered 2021-03-16: 500 [IU] via INTRAVENOUS

## 2021-03-16 MED ORDER — SODIUM CHLORIDE 0.9% FLUSH
10.0000 mL | Freq: Once | INTRAVENOUS | Status: AC
  Administered 2021-03-16: 10 mL via INTRAVENOUS

## 2021-03-16 NOTE — Patient Instructions (Signed)

## 2021-03-16 NOTE — Progress Notes (Signed)
[facility] OFFICE PROGRESS NOTE   Diagnosis: Rectal cancer  INTERVAL HISTORY:   William Good returns as scheduled.  He continues Xeloda and radiation.  Last radiation treatment is later today.  He has intermittent nausea.  No vomiting.  No mouth sores.  He estimates 2-3 loose stools a day.  No hand or foot pain or redness.  No neuropathy symptoms.  Objective:  Vital signs in last 24 hours:  Blood pressure 108/69, pulse 76, temperature 97.8 F (36.6 C), temperature source Oral, resp. rate 20, height 6' (1.829 m), weight 224 lb 12.8 oz (102 kg), SpO2 98 %.    HEENT: No thrush or ulcers. Resp: Distant breath sounds.  Lungs clear bilaterally. Cardio: Regular rate and rhythm. GI: Abdomen soft and nontender.  No hepatomegaly. Vascular: No leg edema. Skin: Palms without erythema. Port-A-Cath without erythema.   Lab Results:  Lab Results  Component Value Date   WBC 5.0 02/27/2021   HGB 12.2 (L) 02/27/2021   HCT 38.3 (L) 02/27/2021   MCV 97.7 02/27/2021   PLT 111 (L) 02/27/2021   NEUTROABS 3.6 02/27/2021    Imaging:  No results found.  Medications: I have reviewed the patient's current medications.  Assessment/Plan: Rectal cancer Mass at 6 cm on digital exam/anoscopy 08/25/2020 Colonoscopy 09/05/2020-mid rectal mass-biopsy, adenocarcinoma, no loss of mismatch repair protein expression CT abdomen/pelvis 08/29/2020-small amount of free fluid in the pelvis, mildly enlarged left external iliac node, cystic changes of the left kidney with hydronephrosis and regions of cortical thinning with transition at the left UPJ suggesting chronic left UPJ obstruction MRI pelvis 09/24/2020-tumor 6 cm from the anal verge, 1.8 cm from the internal anal sphincter, T2, 7 mm lymph node in the left mesorectal sheath, N1 Cycle 1 FOLFOX 10/08/2020 Cycle 2 FOLFOX 10/22/2020 Cycle 3 FOLFOX 11/12/2020 (oxaliplatin dose reduced, white cell growth factor support on day of pump  discontinuation) Cycle 4 FOLFOX 11/26/2020, 5-FU bolus and infusion dose reduced secondary to diarrhea, Ziextenzo Cycle 5 FOLFOX 12/10/2020, 5-FU bolus held due to diarrhea, Ziextenzo Cycle 6 FOLFOX 12/24/2020, 5-FU bolus held, Ziextenzo Cycle 7 FOLFOX 01/07/2021, oxaliplatin held due to neuropathy, 5-FU bolus held, Ziextenzo held Cycle 8 FOLFOX 01/21/2021, oxaliplatin held due to neuropathy, 5-FU bolus held, Ziextenzo held Radiation/Xeloda 02/02/2021-03/16/2021     Diabetes Chronic kidney disease Bipolar disorder Hypertension Hyperlipidemia Restless leg syndrome Multiple colon polyps removed on the colonoscopy 09/05/2020-tubular adenomas including a rectal polyp with high-grade dysplasia-polypectomy margin negative for dysplasia Portacath placement, Dr Zenia Resides, 10/02/2020  Disposition: William Good appears stable.  He will complete the course of radiation/Xeloda today.  He has an appointment with Dr. Johney Maine 03/17/2021.  He will return for lab/flush/office visit in approximately 4 weeks.  He will contact the office in the interim with any problems.    Ned Card ANP/GNP-BC   03/16/2021  1:12 PM

## 2021-03-17 ENCOUNTER — Other Ambulatory Visit: Payer: Self-pay | Admitting: Nurse Practitioner

## 2021-03-17 ENCOUNTER — Ambulatory Visit: Payer: Self-pay | Admitting: Surgery

## 2021-03-17 ENCOUNTER — Telehealth: Payer: Self-pay | Admitting: *Deleted

## 2021-03-17 DIAGNOSIS — F333 Major depressive disorder, recurrent, severe with psychotic symptoms: Secondary | ICD-10-CM | POA: Insufficient documentation

## 2021-03-17 DIAGNOSIS — K219 Gastro-esophageal reflux disease without esophagitis: Secondary | ICD-10-CM | POA: Insufficient documentation

## 2021-03-17 DIAGNOSIS — R739 Hyperglycemia, unspecified: Secondary | ICD-10-CM

## 2021-03-17 DIAGNOSIS — G2581 Restless legs syndrome: Secondary | ICD-10-CM | POA: Insufficient documentation

## 2021-03-17 DIAGNOSIS — E785 Hyperlipidemia, unspecified: Secondary | ICD-10-CM | POA: Insufficient documentation

## 2021-03-17 DIAGNOSIS — Z720 Tobacco use: Secondary | ICD-10-CM | POA: Diagnosis not present

## 2021-03-17 DIAGNOSIS — R0602 Shortness of breath: Secondary | ICD-10-CM | POA: Diagnosis not present

## 2021-03-17 DIAGNOSIS — C20 Malignant neoplasm of rectum: Secondary | ICD-10-CM

## 2021-03-17 DIAGNOSIS — N401 Enlarged prostate with lower urinary tract symptoms: Secondary | ICD-10-CM | POA: Insufficient documentation

## 2021-03-17 DIAGNOSIS — E1159 Type 2 diabetes mellitus with other circulatory complications: Secondary | ICD-10-CM | POA: Diagnosis not present

## 2021-03-17 DIAGNOSIS — R9431 Abnormal electrocardiogram [ECG] [EKG]: Secondary | ICD-10-CM | POA: Diagnosis not present

## 2021-03-17 DIAGNOSIS — G43009 Migraine without aura, not intractable, without status migrainosus: Secondary | ICD-10-CM | POA: Insufficient documentation

## 2021-03-17 DIAGNOSIS — E669 Obesity, unspecified: Secondary | ICD-10-CM | POA: Insufficient documentation

## 2021-03-17 DIAGNOSIS — N183 Chronic kidney disease, stage 3 unspecified: Secondary | ICD-10-CM | POA: Diagnosis not present

## 2021-03-17 DIAGNOSIS — E559 Vitamin D deficiency, unspecified: Secondary | ICD-10-CM | POA: Insufficient documentation

## 2021-03-17 NOTE — Telephone Encounter (Signed)
Our scheduling team is in process of working on an [facility] PT APPT; per referral faxed to our office today. I have handed notes over to Guinevere Ferrari, [place] in our chart prep team. Staff message sent to Guinevere Ferrari, CMA,Chart prep, Lysbeth Galas and Imagene Gurney who are helping in getting pt an appt. I did s/w the surgeons office for clarification of procedure. I did alert them that w are working very hard to get pt scheduled. Per Dr. Johney Maine he has a limited window for the surgery.

## 2021-03-17 NOTE — Telephone Encounter (Signed)
Primary Cardiologist:None  Chart reviewed as part of pre-operative protocol coverage. Because of Arcenio Mullaly Robinette's past medical history and time since last visit, he/she will require a follow-up visit in order to better assess preoperative cardiovascular risk.  Pre-op covering staff: - Please schedule appointment and call patient to inform them. - Please contact requesting surgeon's office via preferred method (i.e, phone, fax) to inform them of need for appointment prior to surgery.  If applicable, this message will also be routed to pharmacy pool and/or primary cardiologist for input on holding anticoagulant/antiplatelet agent as requested below so that this information is available at time of patient's appointment.   Deberah Pelton, NP  03/17/2021, 4:37 PM

## 2021-03-17 NOTE — Telephone Encounter (Signed)
° °  Pre-operative Risk Assessment    Patient Name: William Good  DOB: 1956/02/22 MRN: [id number]     [place] [facility]; PER DR. GROSS HE HAS A LIMITED WINDOW FOR THIS SURGERY; OUR OFFICE IS IN THE PROCESS OF SCHEDULING AN URGENT NEW PT APPT  Request for Surgical Clearance    Procedure:   ROBOTIC LOW INTERIOR RECTO-SIGMOID RESECTION WITH POSSIBLE OSTOMY   Date of Surgery:  Clearance TBD URGENT                                Surgeon:  DR. Michael Boston Surgeon's Group or Practice Name:  [facility] Phone number:  [phone] Fax number:  [phone]   Type of Clearance Requested:   - Medical  - Pharmacy:  Hold Aspirin     Type of Anesthesia:  General    Additional requests/questions:    Jiles Prows   03/17/2021, 4:10 PM

## 2021-03-18 ENCOUNTER — Telehealth: Payer: Self-pay

## 2021-03-18 NOTE — Progress Notes (Signed)
° °                                                                                                                                                          °  Patient Name: William Good MRN: [id number] DOB: [birth year] Referring Physician: Aura Dials (Profile Not Attached) Date of Service: 03/16/2021 [facility], [place]                                                        End Of Treatment Note  Diagnoses: C20-Malignant neoplasm of rectum  Cancer Staging: Stage IIIA, cT2N1M0 adenocarcinom of the distal rectum.   Intent: Curative  Radiation Treatment Dates: 02/02/2021 through 03/16/2021 Site Technique Total Dose (Gy) Dose per Fx (Gy) Completed Fx Beam Energies  Rectum: Rectum 3D 45/45 1.8 25/25 15X  Rectum: Rectum_Bst 3D 5.4/5.4 1.8 3/3 15X   Narrative: The patient tolerated radiation therapy relatively well. He developed nausea relieved by antiemetics, and loose stools relieved with imodium. He did have fatigue associated with treatment as well.  Plan: The patient will receive a call in about one month from the radiation oncology department. He will continue follow up with Dr. Benay Spice as well as Dr. Johney Maine in colorectal surgery.   ________________________________________________    Carola Rhine, PAC

## 2021-03-18 NOTE — Telephone Encounter (Signed)
Pt has URGENT NEW PT Appt with Dr. Percival Spanish 03/20/21 for pre op clearance. Will forward notes to MD for upcoming appt. Will send FYI to surgeon's office pt has appt 03/20/21.

## 2021-03-18 NOTE — Telephone Encounter (Signed)
NOTES SCANNED TO REFERRRAL 

## 2021-03-19 DIAGNOSIS — E118 Type 2 diabetes mellitus with unspecified complications: Secondary | ICD-10-CM

## 2021-03-19 DIAGNOSIS — Z01818 Encounter for other preprocedural examination: Secondary | ICD-10-CM | POA: Insufficient documentation

## 2021-03-19 HISTORY — DX: Type 2 diabetes mellitus with unspecified complications: E11.8

## 2021-03-19 NOTE — Progress Notes (Signed)
Cardiology Office Note   Date:  03/20/2021   ID:  William Good, William Good 06-08-1955, MRN [id number]  PCP:  Aura Dials, MD  Cardiologist:   None Referring:  Michael Boston, MD  Chief Complaint  Patient presents with   Abnormal ECG      History of Present Illness: William Good is a 66 y.o. male who presents for preop clearance.  He saw Dr. Aundra Dubin years ago chest pain.  He had a negative perfusion study.  He has rectal cancer and is in need of resection.  He has had treatment with FOLFOX and radiation.  [name]  He was sent to [place] because of his significant risk factors.  He is to have surgical resection of this.     The patient has a markedly abnormal EKG.  Went back and looked at 2011 and this was noted there as well.  This was the indication for the perfusion study.  At that time he did have an echocardiogram I note that he had moderate left ventricular hypertrophy with possibly asymmetric septal hypertrophy and a well-preserved ejection fraction.  There were no significant valvular abnormalities.  The patient has a no history of chest pain or SOB.   He is able to walk 125 yards routinely to the mailbox.  He lives by himself and his brother lives across the street and he was to see him.  He does not have any chest pressure, neck or arm discomfort.  He denies any shortness of breath, PND or orthopnea.  He has no palpitations, presyncope or syncope.  He is actually had significant weight loss of about 25 pounds since starting therapy.   Past Medical History:  Diagnosis Date   Back pain    Bipolar disorder ([place])    bipolar 1   Hypertension    Knee pain    Migraine    Pre-diabetes    Rectal cancer ([place])     Past Surgical History:  Procedure Laterality Date   BACK SURGERY     JOINT REPLACEMENT     PORTACATH PLACEMENT N/A 10/02/2020   Procedure: PORT-A-CATH PLACEMENT;  Surgeon: Dwan Bolt, MD;  Location: [facility];  Service: General;  Laterality: N/A;     Current  Outpatient Medications  Medication Sig Dispense Refill   acetaminophen (TYLENOL) 325 MG tablet Take 650 mg every 4 (four) hours as needed by mouth for mild pain.     albuterol (VENTOLIN HFA) 108 (90 Base) MCG/ACT inhaler Inhale 2 puffs into the lungs every 6 (six) hours as needed for wheezing or shortness of breath. 8 g 2   ALPRAZolam (XANAX) 1 MG tablet Take 1 mg by mouth 2 (two) times daily.     aspirin EC 81 MG tablet Take 81 mg by mouth daily. Swallow whole.     capecitabine (XELODA) 500 MG tablet Take 4 tablets (2000mg ) by mouth in AM and 3 tablets (1500mg ) in PM. Take with food. Take Monday-Friday. Take only on days of radiation. 196 tablet 0   Cholecalciferol (VITAMIN D3) 50 MCG (2000 UT) capsule Take 2,000 Units by mouth daily.     Docusate Sodium (COLACE PO) Take 1 capsule by mouth daily as needed.     HYDROcodone-acetaminophen (NORCO/VICODIN) 5-325 MG tablet Take 0.5-1 tablets by mouth every 12 (twelve) hours as needed for moderate pain. 30 tablet 0   lidocaine-prilocaine (EMLA) cream Apply 1 application topically as directed. Apply 1/2 tablespoon to port-a-cath site 2 hours prior to stick and cover  with plastic wrap to numb site 30 g 0   loperamide (IMODIUM) 2 MG capsule Take 2 mg by mouth as needed for diarrhea or loose stools.     LOTENSIN HCT 20-12.5 MG per tablet TAKE (2) TABLETS DAILY (Patient taking differently: Take 1 tablet by mouth daily.) 60 each 0   mirtazapine (REMERON) 45 MG tablet Take 45 mg by mouth at bedtime.     omeprazole (PRILOSEC) 20 MG capsule Take 1 capsule (20 mg total) 2 (two) times daily by mouth. 60 capsule 0   ondansetron (ZOFRAN) 8 MG tablet Take 1 tablet (8 mg total) by mouth every 8 (eight) hours as needed for nausea or vomiting. 30 tablet 1   Polyethylene Glycol 3350 (MIRALAX PO) Take 17 g by mouth daily.     potassium chloride SA (KLOR-CON) 20 MEQ tablet TAKE ONE TABLET ONCE DAILY 30 tablet 1   pravastatin (PRAVACHOL) 40 MG tablet Take 40 mg by mouth  daily.     predniSONE (DELTASONE) 10 MG tablet Take by mouth.     prochlorperazine (COMPAZINE) 10 MG tablet Take 1 tablet (10 mg total) by mouth every 6 (six) hours as needed for nausea. 60 tablet 1   propranolol ER (INDERAL LA) 160 MG SR capsule Take 160 mg daily by mouth.     risperiDONE (RISPERDAL) 0.25 MG tablet Take 0.25 mg by mouth at bedtime.     SUMAtriptan (IMITREX) 100 MG tablet Take 100 mg by mouth 2 (two) times daily as needed for migraine.     benztropine (COGENTIN) 0.5 MG tablet Take 0.5 mg by mouth at bedtime. (Patient not taking: Reported on 03/20/2021)     No current facility-administered medications for this visit.    Allergies:   Patient has no known allergies.    Social History:  The patient  reports that he has been smoking cigarettes. He has been smoking an average of .5 packs per day. He has never used smokeless tobacco. He reports that he does not drink alcohol and does not use drugs.   Family History:  The patient's family history includes ALS in his mother; Heart failure in his father.    ROS:  Please see the history of present illness.   Otherwise, review of systems are positive for none.   All other systems are reviewed and negative.    PHYSICAL EXAM: VS:  BP 96/64    Pulse 70    Ht 6' (1.829 m)    Wt 225 lb 9.6 oz (102.3 kg)    SpO2 96%    BMI 30.60 kg/m  , BMI Body mass index is 30.6 kg/m. GENERAL:  Well appearing HEENT:  Pupils equal round and reactive, fundi not visualized, oral mucosa unremarkable NECK:  No jugular venous distention, waveform within normal limits, carotid upstroke brisk and symmetric, no bruits, no thyromegaly LYMPHATICS:  No cervical, inguinal adenopathy LUNGS:  Clear to auscultation bilaterally BACK:  No CVA tenderness CHEST:  Unremarkable HEART:  PMI not displaced or sustained,S1 and S2 within normal limits, no S3, no S4, no clicks, no rubs, no murmurs.  He has a very soft systolic murmur ABD:  Flat, positive bowel sounds normal in  frequency in pitch, no bruits, no rebound, no guarding, no midline pulsatile mass, no hepatomegaly, no splenomegaly EXT:  2 plus pulses throughout, no edema, no cyanosis no clubbing SKIN:  No rashes no nodules NEURO:  Cranial nerves II through XII grossly intact, motor grossly intact throughout PSYCH:  Cognitively intact, oriented to  person place and time    EKG:  EKG is ordered today. The ekg ordered today demonstrates sinus rhythm, rate 78, axis within normal limits, QTC borderline, left ventricular hypertrophy with significant repolarization changes but no change from previous.   Recent Labs: 03/16/2021: ALT 7; BUN 5; Creatinine 0.98; Hemoglobin 12.2; Magnesium 1.6; Platelet Count 100; Potassium 3.7; Sodium 140    Lipid Panel No results found for: CHOL, TRIG, HDL, CHOLHDL, VLDL, LDLCALC, LDLDIRECT    Wt Readings from Last 3 Encounters:  03/20/21 225 lb 9.6 oz (102.3 kg)  03/16/21 224 lb 12.8 oz (102 kg)  02/27/21 227 lb 6.4 oz (103.1 kg)      Other studies Reviewed: Additional studies/ records that were reviewed today include: Previous cardiac records, surgical records.. Review of the above records demonstrates:  Please see elsewhere in the note.     ASSESSMENT AND PLAN:  PREOP CLEARANCE: The patient has no high risk symptoms suggestive of obstructive coronary artery disease.  He has a reasonable functional level.  I do not think further preoperative testing is indicated other than the echo as below.  I will send a message to Dr. Johney Maine because the patient will need to be watched carefully for volume management with his ventricular hypertrophy.  Have to be monitored for arrhythmias.  It may be prudent to do his surgery [facility] if possible.  HTN: His blood pressure is well controlled.  No change in therapy.  DM: A1c is only 5.8.  ABNORMAL EKG: He has a longstanding markedly abnormal EKG.  He likely has septal hypertrophy and hypertrophic cardiomyopathy.  I am going to repeat  an echocardiogram.  Eventually he will need an MRI and other risk stratification.  Ultimately he may need screening for his children.  He has 2 sons.  We began this conversation.   Current medicines are reviewed at length with the patient today.  The patient does not have concerns regarding medicines.  The following changes have been made:  no change  Labs/ tests ordered today include:   Orders Placed This Encounter  Procedures   ECHOCARDIOGRAM COMPLETE     Disposition:   FU with me after his abdominal surgery.  I will schedule him for about made.   Signed, Minus Breeding, MD  03/20/2021 3:23 PM    [facility]

## 2021-03-20 ENCOUNTER — Other Ambulatory Visit: Payer: Self-pay

## 2021-03-20 ENCOUNTER — Encounter: Payer: Self-pay | Admitting: Cardiology

## 2021-03-20 ENCOUNTER — Ambulatory Visit: Payer: Medicare Other | Admitting: Cardiology

## 2021-03-20 VITALS — BP 96/64 | HR 70 | Ht 72.0 in | Wt 225.6 lb

## 2021-03-20 DIAGNOSIS — I10 Essential (primary) hypertension: Secondary | ICD-10-CM

## 2021-03-20 DIAGNOSIS — E118 Type 2 diabetes mellitus with unspecified complications: Secondary | ICD-10-CM | POA: Diagnosis not present

## 2021-03-20 DIAGNOSIS — Z01818 Encounter for other preprocedural examination: Secondary | ICD-10-CM | POA: Diagnosis not present

## 2021-03-20 NOTE — Patient Instructions (Signed)
Medication Instructions:  Continue same medications *If you need a refill on your cardiac medications before your next appointment, please call your pharmacy*   Lab Work: None ordered   Testing/Procedures: Schedule Echo   Follow-Up: At [facility], you and your health needs are our priority.  As part of our continuing mission to provide you with exceptional heart care, we have created designated Provider Care Teams.  These Care Teams include your primary Cardiologist (physician) and Advanced Practice Providers (APPs -  Physician Assistants and Nurse Practitioners) who all work together to provide you with the care you need, when you need it.  We recommend signing up for the patient portal called "MyChart".  Sign up information is provided on this After Visit Summary.  MyChart is used to connect with patients for Virtual Visits (Telemedicine).  Patients are able to view lab/test results, encounter notes, upcoming appointments, etc.  Non-urgent messages can be sent to your provider as well.   To learn more about what you can do with MyChart, go to [email or web address].      Your next appointment: 07/2021    The format for your next appointment: Office    Provider:  Dr.Hochrein

## 2021-03-23 ENCOUNTER — Ambulatory Visit: Payer: Self-pay | Admitting: Surgery

## 2021-03-23 DIAGNOSIS — I10 Essential (primary) hypertension: Secondary | ICD-10-CM | POA: Diagnosis not present

## 2021-03-23 DIAGNOSIS — G43009 Migraine without aura, not intractable, without status migrainosus: Secondary | ICD-10-CM | POA: Diagnosis not present

## 2021-03-23 DIAGNOSIS — N183 Chronic kidney disease, stage 3 unspecified: Secondary | ICD-10-CM | POA: Diagnosis not present

## 2021-03-23 DIAGNOSIS — K219 Gastro-esophageal reflux disease without esophagitis: Secondary | ICD-10-CM | POA: Diagnosis not present

## 2021-03-23 DIAGNOSIS — E785 Hyperlipidemia, unspecified: Secondary | ICD-10-CM | POA: Diagnosis not present

## 2021-03-23 DIAGNOSIS — E1159 Type 2 diabetes mellitus with other circulatory complications: Secondary | ICD-10-CM | POA: Diagnosis not present

## 2021-03-27 ENCOUNTER — Other Ambulatory Visit: Payer: Self-pay | Admitting: Oncology

## 2021-03-27 ENCOUNTER — Other Ambulatory Visit: Payer: Self-pay | Admitting: Urology

## 2021-03-27 DIAGNOSIS — C20 Malignant neoplasm of rectum: Secondary | ICD-10-CM

## 2021-03-30 NOTE — Addendum Note (Signed)
Addended by: Wonda Horner on: 03/30/2021 05:01 PM   Modules accepted: Orders

## 2021-04-01 ENCOUNTER — Other Ambulatory Visit: Payer: Self-pay

## 2021-04-01 ENCOUNTER — Ambulatory Visit (HOSPITAL_COMMUNITY): Payer: Medicare Other | Attending: Cardiovascular Disease

## 2021-04-01 ENCOUNTER — Other Ambulatory Visit: Payer: Self-pay | Admitting: Nurse Practitioner

## 2021-04-01 DIAGNOSIS — R9431 Abnormal electrocardiogram [ECG] [EKG]: Secondary | ICD-10-CM | POA: Diagnosis not present

## 2021-04-01 DIAGNOSIS — I10 Essential (primary) hypertension: Secondary | ICD-10-CM | POA: Insufficient documentation

## 2021-04-01 DIAGNOSIS — Z01818 Encounter for other preprocedural examination: Secondary | ICD-10-CM | POA: Diagnosis not present

## 2021-04-01 DIAGNOSIS — E118 Type 2 diabetes mellitus with unspecified complications: Secondary | ICD-10-CM | POA: Insufficient documentation

## 2021-04-01 DIAGNOSIS — C20 Malignant neoplasm of rectum: Secondary | ICD-10-CM

## 2021-04-01 LAB — ECHOCARDIOGRAM COMPLETE
Area-P 1/2: 3.77 cm2
S' Lateral: 2.6 cm

## 2021-04-01 MED ORDER — HYDROCODONE-ACETAMINOPHEN 5-325 MG PO TABS: 0.5000 | ORAL_TABLET | Freq: Two times a day (BID) | ORAL | 0 refills | Status: DC | PRN

## 2021-04-01 MED ORDER — PERFLUTREN LIPID MICROSPHERE
1.0000 mL | INTRAVENOUS | Status: AC | PRN
  Administered 2021-04-01: 14:00:00 3 mL via INTRAVENOUS

## 2021-04-06 ENCOUNTER — Ambulatory Visit
Admission: RE | Admit: 2021-04-06 | Discharge: 2021-04-06 | Disposition: A | Discharge status: Home / Self Care | Payer: Medicare Other | Source: Ambulatory Visit | Attending: Radiation Oncology | Admitting: Radiation Oncology

## 2021-04-06 DIAGNOSIS — C20 Malignant neoplasm of rectum: Secondary | ICD-10-CM

## 2021-04-06 NOTE — Progress Notes (Signed)
°  Radiation Oncology         [phone]) [phone] ________________________________  Name: MUHAMAD SERANO MRN: [id number]  Date of Service: 04/06/2021  DOB: 1955-05-27  Post Treatment Telephone Note  Diagnosis:   Stage IIIA, cT2N1M0 adenocarcinom of the distal rectum.   Intent: Curative  Radiation Treatment Dates: 02/02/2021 through 03/16/2021 Site Technique Total Dose (Gy) Dose per Fx (Gy) Completed Fx Beam Energies  Rectum: Rectum 3D 45/45 1.8 25/25 15X  Rectum: Rectum_Bst 3D 5.4/5.4 1.8 3/3 15X   Narrative: The patient tolerated radiation therapy relatively well. He developed nausea relieved by antiemetics, and loose stools relieved with imodium. He did have fatigue associated with treatment as well.   Impression/Plan: 1. Stage IIIA, cT2N1M0 adenocarcinom of the distal rectum. I could not reach the patient by phone or leave a voicemail. However, we would be happy to continue to follow him as needed, but he will also continue to follow up with Dr. Dr. Benay Spice as well as Dr. Johney Maine in colorectal surgery.       Carola Rhine, PAC

## 2021-04-14 ENCOUNTER — Inpatient Hospital Stay: Payer: Medicare Other | Attending: Oncology

## 2021-04-14 ENCOUNTER — Inpatient Hospital Stay: Payer: Medicare Other

## 2021-04-14 ENCOUNTER — Other Ambulatory Visit: Payer: Self-pay

## 2021-04-14 ENCOUNTER — Inpatient Hospital Stay: Payer: Medicare Other | Admitting: Oncology

## 2021-04-14 VITALS — BP 114/71 | HR 68 | Temp 97.0°F | Resp 16 | Wt 234.6 lb

## 2021-04-14 DIAGNOSIS — C20 Malignant neoplasm of rectum: Secondary | ICD-10-CM

## 2021-04-14 DIAGNOSIS — E785 Hyperlipidemia, unspecified: Secondary | ICD-10-CM | POA: Diagnosis not present

## 2021-04-14 DIAGNOSIS — G620 Drug-induced polyneuropathy: Secondary | ICD-10-CM | POA: Insufficient documentation

## 2021-04-14 DIAGNOSIS — Z95828 Presence of other vascular implants and grafts: Secondary | ICD-10-CM

## 2021-04-14 DIAGNOSIS — F319 Bipolar disorder, unspecified: Secondary | ICD-10-CM | POA: Diagnosis not present

## 2021-04-14 DIAGNOSIS — E1122 Type 2 diabetes mellitus with diabetic chronic kidney disease: Secondary | ICD-10-CM | POA: Insufficient documentation

## 2021-04-14 DIAGNOSIS — I129 Hypertensive chronic kidney disease with stage 1 through stage 4 chronic kidney disease, or unspecified chronic kidney disease: Secondary | ICD-10-CM | POA: Insufficient documentation

## 2021-04-14 DIAGNOSIS — K635 Polyp of colon: Secondary | ICD-10-CM | POA: Diagnosis not present

## 2021-04-14 DIAGNOSIS — N189 Chronic kidney disease, unspecified: Secondary | ICD-10-CM | POA: Insufficient documentation

## 2021-04-14 DIAGNOSIS — G2581 Restless legs syndrome: Secondary | ICD-10-CM | POA: Insufficient documentation

## 2021-04-14 LAB — CMP (CANCER CENTER ONLY)
ALT: 24 U/L (ref 0–44)
AST: 28 U/L (ref 15–41)
Albumin: 3.8 g/dL (ref 3.5–5.0)
Alkaline Phosphatase: 70 U/L (ref 38–126)
Anion gap: 8 (ref 5–15)
BUN: 10 mg/dL (ref 8–23)
CO2: 28 mmol/L (ref 22–32)
Calcium: 8.8 mg/dL — ABNORMAL LOW (ref 8.9–10.3)
Chloride: 103 mmol/L (ref 98–111)
Creatinine: 1.07 mg/dL (ref 0.61–1.24)
GFR, Estimated: >60 mL/min (ref >60)
Glucose, Bld: 223 mg/dL — ABNORMAL HIGH (ref 70–99)
Potassium: 3.5 mmol/L (ref 3.5–5.1)
Sodium: 139 mmol/L (ref 135–145)
Total Bilirubin: 0.4 mg/dL (ref 0.3–1.2)
Total Protein: 6.2 g/dL — ABNORMAL LOW (ref 6.5–8.1)

## 2021-04-14 LAB — CBC WITH DIFFERENTIAL (CANCER CENTER ONLY)
Abs Immature Granulocytes: 0.03 10*3/uL (ref 0.00–0.07)
Basophils Absolute: 0 10*3/uL (ref 0.0–0.1)
Basophils Relative: 0 %
Eosinophils Absolute: 0.1 10*3/uL (ref 0.0–0.5)
Eosinophils Relative: 3 %
HCT: 38.5 % — ABNORMAL LOW (ref 39.0–52.0)
Hemoglobin: 12.8 g/dL — ABNORMAL LOW (ref 13.0–17.0)
Immature Granulocytes: 1 %
Lymphocytes Relative: 20 %
Lymphs Abs: 0.9 10*3/uL (ref 0.7–4.0)
MCH: 33.2 pg (ref 26.0–34.0)
MCHC: 33.2 g/dL (ref 30.0–36.0)
MCV: 100 fL (ref 80.0–100.0)
Monocytes Absolute: 0.4 10*3/uL (ref 0.1–1.0)
Monocytes Relative: 8 %
Neutro Abs: 3.1 10*3/uL (ref 1.7–7.7)
Neutrophils Relative %: 68 %
Platelet Count: 114 10*3/uL — ABNORMAL LOW (ref 150–400)
RBC: 3.85 MIL/uL — ABNORMAL LOW (ref 4.22–5.81)
RDW: 18 % — ABNORMAL HIGH (ref 11.5–15.5)
WBC Count: 4.5 10*3/uL (ref 4.0–10.5)
nRBC: 0 % (ref 0.0–0.2)

## 2021-04-14 LAB — CEA (ACCESS): CEA (CHCC): 3.54 ng/mL (ref 0.00–5.00)

## 2021-04-14 LAB — MAGNESIUM: Magnesium: 1.5 mg/dL — ABNORMAL LOW (ref 1.7–2.4)

## 2021-04-14 MED ORDER — HEPARIN SOD (PORK) LOCK FLUSH 100 UNIT/ML IV SOLN
500.0000 [IU] | Freq: Once | INTRAVENOUS | Status: AC
  Administered 2021-04-14: 500 [IU] via INTRAVENOUS

## 2021-04-14 MED ORDER — SODIUM CHLORIDE 0.9% FLUSH
10.0000 mL | Freq: Once | INTRAVENOUS | Status: AC
  Administered 2021-04-14: 10 mL via INTRAVENOUS

## 2021-04-14 MED ORDER — MAGNESIUM OXIDE -MG SUPPLEMENT 400 (240 MG) MG PO TABS: 400.0000 mg | ORAL_TABLET | Freq: Two times a day (BID) | ORAL | 0 refills | Status: DC

## 2021-04-14 NOTE — Addendum Note (Signed)
Addended by: Betsy Coder B on: 04/14/2021 10:48 AM   Modules accepted: Orders

## 2021-04-14 NOTE — Progress Notes (Signed)
[place] OFFICE PROGRESS NOTE   Diagnosis: Rectal cancer  INTERVAL HISTORY:   Mr Verbrugge returns as scheduled.  No difficulty with bowel function.  He has numbness and tingling in the hands and feet.  This does not interfere with activity.  He saw Dr. Johney Maine on 03/17/2021.  A rectal mass was palpated.  He is being scheduled for surgery in March.  Objective:  Vital signs in last 24 hours:  Blood pressure 114/71, pulse 68, temperature (!) 97 F (36.1 C), temperature source Tympanic, resp. rate 16, weight 234 lb 9.6 oz (106.4 kg), SpO2 96 %.    Lymphatics: No cervical, supraclavicular, axillary, or inguinal nodes Resp: Lungs clear bilaterally Cardio: Distant heart sounds, regular rhythm GI: No hepatosplenomegaly, no mass, nontender Vascular: No leg edema  Portacath/PICC-without erythema  Lab Results:  Lab Results  Component Value Date   WBC 4.5 04/14/2021   HGB 12.8 (L) 04/14/2021   HCT 38.5 (L) 04/14/2021   MCV 100.0 04/14/2021   PLT 114 (L) 04/14/2021   NEUTROABS 3.1 04/14/2021    CMP  Lab Results  Component Value Date   NA 140 03/16/2021   K 3.7 03/16/2021   CL 104 03/16/2021   CO2 30 03/16/2021   GLUCOSE 175 (H) 03/16/2021   BUN 5 (L) 03/16/2021   CREATININE 0.98 03/16/2021   CALCIUM 8.5 (L) 03/16/2021   PROT 5.9 (L) 03/16/2021   ALBUMIN 3.5 03/16/2021   AST 16 03/16/2021   ALT 7 03/16/2021   ALKPHOS 77 03/16/2021   BILITOT 0.4 03/16/2021   GFRNONAA >60 03/16/2021   GFRAA >60 01/17/2017    Lab Results  Component Value Date   CEA 8.80 (H) 01/07/2021    Medications: I have reviewed the patient's current medications.   Assessment/Plan:  Rectal cancer Mass at 6 cm on digital exam/anoscopy 08/25/2020 Colonoscopy 09/05/2020-mid rectal mass-biopsy, adenocarcinoma, no loss of mismatch repair protein expression CT abdomen/pelvis 08/29/2020-small amount of free fluid in the pelvis, mildly enlarged left external iliac node, cystic changes  of the left kidney with hydronephrosis and regions of cortical thinning with transition at the left UPJ suggesting chronic left UPJ obstruction MRI pelvis 09/24/2020-tumor 6 cm from the anal verge, 1.8 cm from the internal anal sphincter, T2, 7 mm lymph node in the left mesorectal sheath, N1 Cycle 1 FOLFOX 10/08/2020 Cycle 2 FOLFOX 10/22/2020 Cycle 3 FOLFOX 11/12/2020 (oxaliplatin dose reduced, white cell growth factor support on day of pump discontinuation) Cycle 4 FOLFOX 11/26/2020, 5-FU bolus and infusion dose reduced secondary to diarrhea, Ziextenzo Cycle 5 FOLFOX 12/10/2020, 5-FU bolus held due to diarrhea, Ziextenzo Cycle 6 FOLFOX 12/24/2020, 5-FU bolus held, Ziextenzo Cycle 7 FOLFOX 01/07/2021, oxaliplatin held due to neuropathy, 5-FU bolus held, Ziextenzo held Cycle 8 FOLFOX 01/21/2021, oxaliplatin held due to neuropathy, 5-FU bolus held, Ziextenzo held Radiation/Xeloda 02/02/2021-03/16/2021     Diabetes Chronic kidney disease Bipolar disorder Hypertension Hyperlipidemia Restless leg syndrome Multiple colon polyps removed on the colonoscopy 09/05/2020-tubular adenomas including a rectal polyp with high-grade dysplasia-polypectomy margin negative for dysplasia Portacath placement, Dr Zenia Resides, 10/02/2020 Oxaliplatin neuropathy   Disposition: Mr. Bisig has completed neoadjuvant therapy for rectal cancer.  He has mild oxaliplatin neuropathy symptoms.  Hopefully the symptoms will improve over the next several months. He will begin a magnesium supplement.  He will continue potassium.  I will contact Dr. Johney Maine regarding the indication for restaging CTs prior to surgery.  Mr. Kleve is scheduled for surgery in March.  He will return for an office visit  2 to 3 weeks following surgery.  The Port-A-Cath will remain in place until after the ileostomy reversal.  Betsy Coder, MD  04/14/2021  10:10 AM

## 2021-05-07 DIAGNOSIS — E119 Type 2 diabetes mellitus without complications: Secondary | ICD-10-CM | POA: Diagnosis not present

## 2021-05-07 DIAGNOSIS — H2513 Age-related nuclear cataract, bilateral: Secondary | ICD-10-CM | POA: Diagnosis not present

## 2021-05-07 DIAGNOSIS — H40033 Anatomical narrow angle, bilateral: Secondary | ICD-10-CM | POA: Diagnosis not present

## 2021-05-11 ENCOUNTER — Other Ambulatory Visit: Payer: Self-pay | Admitting: Oncology

## 2021-05-13 NOTE — Patient Instructions (Addendum)
DUE TO COVID-19 ONLY ONE VISITOR  (aged 67 and older)  IS ALLOWED TO COME WITH YOU AND STAY IN THE WAITING ROOM ONLY DURING PRE OP AND PROCEDURE.   **NO VISITORS ARE ALLOWED IN THE SHORT STAY AREA OR RECOVERY ROOM!!**  IF YOU WILL BE ADMITTED INTO THE HOSPITAL YOU ARE ALLOWED ONLY TWO SUPPORT PEOPLE DURING VISITATION HOURS ONLY (7 AM -8PM)   The support person(s) must pass our screening, gel in and out, and wear a mask at all times, including in the patients room. Patients must also wear a mask when staff or their support person are in the room. Visitors GUEST BADGE MUST BE WORN VISIBLY  One adult visitor may remain with you overnight and MUST be in the room by 8 P.M.      COVID SWAB TESTING MUST BE COMPLETED ON:  05/25/21 at 12:15 PM    (*ARRIVE AT YOUR APPOINTMENT TIME STAFF IS NOT HERE BEFORE 8AM!!!*)    Site: [facility] [address] [place] Enter: Main Entrance have a seat in the waiting area to the right of main entrance (DO NOT [place]!!!!!) Dial: [phone] to alert staff you have arrived  You are not required to quarantine, however you are required to wear a well-fitted mask when you are out and around people not in your household.  Hand Hygiene often Do NOT share personal items Notify your provider if you are in close contact with someone who has COVID or you develop fever 100.4 or greater, new onset of sneezing, cough, sore throat, shortness of breath or body aches.  [place], [address], must go inside of the hospital, NOT A DRIVE THRU!  (Must self quarantine after testing. Follow instructions on handout.)         Your procedure is scheduled on: 05/27/21   Report to [facility] Main Entrance    Report to admitting at 6:15 AM   Call this number if you have problems the morning of surgery [phone]   Follow all the instructions from the Dr's  office  Drink plenty of liquids on the prep day to prevent dehydration   Do not eat food :After Midnight.   After Midnight you may have the following liquids until _5:30 _____ AM DAY OF SURGERY  Water Black Coffee (sugar ok, NO MILK/CREAM OR CREAMERS)  Tea (sugar ok, NO MILK/CREAM OR CREAMERS) regular and decaf                             Plain Jell-O (NO RED)                                           Fruit ices (not with fruit pulp, NO RED)                                     Popsicles (NO RED)  Juice: apple, WHITE grape, WHITE cranberry Sports drinks like Gatorade (NO RED) Clear broth(vegetable,chicken,beef)               Drink 2 G2 drinks AT 10:00 PM the night before surgery.        The day of surgery:  Drink ONE (1) G2 at 5:15 AM the morning of surgery. Drink in one sitting. Do not sip.  This drink was given to you during your hospital  pre-op appointment visit. Nothing else to drink after completing the   G2.by 5:30 AM          If you have questions, please contact your surgeons office.   FOLLOW BOWEL PREP AND ANY ADDITIONAL PRE OP INSTRUCTIONS YOU RECEIVED FROM YOUR SURGEON'S OFFICE!!!     Oral Hygiene is also important to reduce your risk of infection.                                    Remember - BRUSH YOUR TEETH THE MORNING OF SURGERY WITH YOUR REGULAR TOOTHPASTE   Do NOT smoke after Midnight   Take these medicines the morning of surgery with A SIP OF WATER: Propranolol, Omeprazole, Use your inhalers and bring them with you.  Stop taking _ASA 81 mg__________on __________as instructed by _____________.  Stop taking ____________as directed by your Surgeon/Cardiologist.  Contact your Surgeon/Cardiologist for instructions on Anticoagulant Therapy prior to surgery.    Bring CPAP mask and tubing day of surgery.                              You may not have any metal on your body including hair  pins, jewelry, and body piercing             Do not wear make-up, lotions, powders, perfumes/cologne, or deodorant                Men may shave face and neck.   Do not bring valuables to the hospital. [place].   Contacts, dentures or bridgework may not be worn into surgery.   Bring small overnight bag day of surgery.    Patients discharged on the day of surgery will not be allowed to drive home.  Someone NEEDS to stay with you for the first 24 hours after anesthesia.   Special Instructions: Bring a copy of your healthcare power of attorney and living will documents the day of surgery if you haven't scanned them before.              Please read over the following fact sheets you were given: IF YOU HAVE QUESTIONS ABOUT YOUR PRE-OP INSTRUCTIONS PLEASE CALL [phone]     [facility] - Preparing for Surgery Before surgery, you can play an important role.  Because skin is not sterile, your skin needs to be as free of germs as possible.  You can reduce the number of germs on your skin by washing with CHG (chlorahexidine gluconate) soap before surgery.  CHG is an antiseptic cleaner which kills germs and bonds with the skin to continue killing germs even after washing. Please DO NOT use if you have an allergy to CHG or antibacterial soaps.  If your skin becomes reddened/irritated stop using the CHG and inform your nurse  when you arrive at Short Stay. You may shave your face/neck. Please follow these instructions carefully:  1.  Shower with CHG Soap the night before surgery and the  morning of Surgery.  2.  If you choose to wash your hair, wash your hair first as usual with your  normal  shampoo.  3.  After you shampoo, rinse your hair and body thoroughly to remove the  shampoo.                            4.  Use CHG as you would any other liquid soap.  You can apply chg directly  to the skin and wash                       Gently with a  scrungie or clean washcloth.  5.  Apply the CHG Soap to your body ONLY FROM THE NECK DOWN.   Do not use on face/ open                           Wound or open sores. Avoid contact with eyes, ears mouth and genitals (private parts).                       Wash face,  Genitals (private parts) with your normal soap.             6.  Wash thoroughly, paying special attention to the area where your surgery  will be performed.  7.  Thoroughly rinse your body with warm water from the neck down.  8.  DO NOT shower/wash with your normal soap after using and rinsing off  the CHG Soap.                9.  Pat yourself dry with a clean towel.            10.  Wear clean pajamas.            11.  Place clean sheets on your bed the night of your first shower and do not  sleep with pets. Day of Surgery : Do not apply any lotions/deodorants the morning of surgery.  Please wear clean clothes to the hospital/surgery center.  FAILURE TO FOLLOW THESE INSTRUCTIONS MAY RESULT IN THE CANCELLATION OF YOUR SURGERY PATIENT SIGNATURE_________________________________  NURSE SIGNATURE__________________________________  ________________________________________________________________________   William Good  An incentive spirometer is a tool that can help keep your lungs clear and active. This tool measures how well you are filling your lungs with each breath. Taking long deep breaths may help reverse or decrease the chance of developing breathing (pulmonary) problems (especially infection) following: A long period of time when you are unable to move or be active. BEFORE THE PROCEDURE  If the spirometer includes an indicator to show your best effort, your nurse or respiratory therapist will set it to a desired goal. If possible, sit up straight or lean slightly forward. Try not to slouch. Hold the incentive spirometer in an upright position. INSTRUCTIONS FOR USE  Sit on the edge of your bed if possible, or sit up as  far as you can in bed or on a chair. Hold the incentive spirometer in an upright position. Breathe out normally. Place the mouthpiece in your mouth and seal your lips tightly around it. Breathe in slowly and as deeply as possible, raising the  piston or the ball toward the top of the column. Hold your breath for 3-5 seconds or for as long as possible. Allow the piston or ball to fall to the bottom of the column. Remove the mouthpiece from your mouth and breathe out normally. Rest for a few seconds and repeat Steps 1 through 7 at least 10 times every 1-2 hours when you are awake. Take your time and take a few normal breaths between deep breaths. The spirometer may include an indicator to show your best effort. Use the indicator as a goal to work toward during each repetition. After each set of 10 deep breaths, practice coughing to be sure your lungs are clear. If you have an incision (the cut made at the time of surgery), support your incision when coughing by placing a pillow or rolled up towels firmly against it. Once you are able to get out of bed, walk around indoors and cough well. You may stop using the incentive spirometer when instructed by your caregiver.  RISKS AND COMPLICATIONS Take your time so you do not get dizzy or light-headed. If you are in pain, you may need to take or ask for pain medication before doing incentive spirometry. It is harder to take a deep breath if you are having pain. AFTER USE Rest and breathe slowly and easily. It can be helpful to keep track of a log of your progress. Your caregiver can provide you with a simple table to help with this. If you are using the spirometer at home, follow these instructions: [place] IF:  You are having difficultly using the spirometer. You have trouble using the spirometer as often as instructed. Your pain medication is not giving enough relief while using the spirometer. You develop fever of 100.5 F (38.1 C) or  higher. SEEK IMMEDIATE MEDICAL CARE IF:  You cough up bloody sputum that had not been present before. You develop fever of 102 F (38.9 C) or greater. You develop worsening pain at or near the incision site. MAKE SURE YOU:  Understand these instructions. Will watch your condition. Will get help right away if you are not doing well or get worse. Document Released: 07/05/2006 Document Revised: 05/17/2011 Document Reviewed: 09/05/2006 [facility] Patient Information 2014 [place], [place].   ________________________________________________________________________

## 2021-05-14 ENCOUNTER — Other Ambulatory Visit: Payer: Self-pay

## 2021-05-14 ENCOUNTER — Encounter (HOSPITAL_COMMUNITY): Payer: Self-pay

## 2021-05-14 ENCOUNTER — Encounter (HOSPITAL_COMMUNITY)
Admission: RE | Admit: 2021-05-14 | Discharge: 2021-05-14 | Disposition: A | Discharge status: Home / Self Care | Payer: Medicare Other | Source: Ambulatory Visit | Attending: Surgery | Admitting: Surgery

## 2021-05-14 VITALS — BP 111/78 | HR 69 | Temp 98.2°F | Resp 16 | Ht 72.0 in | Wt 240.0 lb

## 2021-05-14 DIAGNOSIS — I1310 Hypertensive heart and chronic kidney disease without heart failure, with stage 1 through stage 4 chronic kidney disease, or unspecified chronic kidney disease: Secondary | ICD-10-CM | POA: Diagnosis not present

## 2021-05-14 DIAGNOSIS — Z01812 Encounter for preprocedural laboratory examination: Secondary | ICD-10-CM | POA: Diagnosis not present

## 2021-05-14 DIAGNOSIS — Z01818 Encounter for other preprocedural examination: Secondary | ICD-10-CM

## 2021-05-14 DIAGNOSIS — I422 Other hypertrophic cardiomyopathy: Secondary | ICD-10-CM | POA: Insufficient documentation

## 2021-05-14 DIAGNOSIS — Z87891 Personal history of nicotine dependence: Secondary | ICD-10-CM | POA: Diagnosis not present

## 2021-05-14 DIAGNOSIS — C20 Malignant neoplasm of rectum: Secondary | ICD-10-CM | POA: Insufficient documentation

## 2021-05-14 DIAGNOSIS — N183 Chronic kidney disease, stage 3 unspecified: Secondary | ICD-10-CM | POA: Diagnosis not present

## 2021-05-14 DIAGNOSIS — E118 Type 2 diabetes mellitus with unspecified complications: Secondary | ICD-10-CM

## 2021-05-14 DIAGNOSIS — E1165 Type 2 diabetes mellitus with hyperglycemia: Secondary | ICD-10-CM | POA: Insufficient documentation

## 2021-05-14 DIAGNOSIS — E1122 Type 2 diabetes mellitus with diabetic chronic kidney disease: Secondary | ICD-10-CM | POA: Insufficient documentation

## 2021-05-14 DIAGNOSIS — R739 Hyperglycemia, unspecified: Secondary | ICD-10-CM

## 2021-05-14 LAB — CBC
HCT: 42.5 % (ref 39.0–52.0)
Hemoglobin: 14.2 g/dL (ref 13.0–17.0)
MCH: 32.9 pg (ref 26.0–34.0)
MCHC: 33.4 g/dL (ref 30.0–36.0)
MCV: 98.6 fL (ref 80.0–100.0)
Platelets: 131 10*3/uL — ABNORMAL LOW (ref 150–400)
RBC: 4.31 MIL/uL (ref 4.22–5.81)
RDW: 14.6 % (ref 11.5–15.5)
WBC: 6.4 10*3/uL (ref 4.0–10.5)
nRBC: 0 % (ref 0.0–0.2)

## 2021-05-14 LAB — BASIC METABOLIC PANEL
Anion gap: 9 (ref 5–15)
BUN: 17 mg/dL (ref 8–23)
CO2: 28 mmol/L (ref 22–32)
Calcium: 8.8 mg/dL — ABNORMAL LOW (ref 8.9–10.3)
Chloride: 100 mmol/L (ref 98–111)
Creatinine, Ser: 1.25 mg/dL — ABNORMAL HIGH (ref 0.61–1.24)
GFR, Estimated: >60 mL/min (ref >60)
Glucose, Bld: 310 mg/dL — ABNORMAL HIGH (ref 70–99)
Potassium: 4.3 mmol/L (ref 3.5–5.1)
Sodium: 137 mmol/L (ref 135–145)

## 2021-05-14 LAB — TYPE AND SCREEN
ABO/RH(D): O POS
Antibody Screen: NEGATIVE

## 2021-05-14 LAB — HEMOGLOBIN A1C
Hgb A1c MFr Bld: 7.3 % — ABNORMAL HIGH (ref 4.8–5.6)
Mean Plasma Glucose: 162.81 mg/dL

## 2021-05-14 LAB — GLUCOSE, CAPILLARY: Glucose-Capillary: 275 mg/dL — ABNORMAL HIGH (ref 70–99)

## 2021-05-14 NOTE — Progress Notes (Signed)
COVID test-05/25/21 at 12:15pm ? ? ?Bowel prep reminder:yes- reviewed with Pt and family member I gave them a copy of the instructions I had and told them to call the Dr's office. . I called [facility] myself and talked to the triage RN where the Pt was getting confused. She said she will reach out to the Pt and family member about the prep. ? ?PCP - Dr. Charlynne Cousins ?Cardiologist - Dr. Vita Barley ? ?Chest x-ray - 10/24/20-epic ?EKG - 03/20/21-epic ?Stress Test - no ?ECHO - 04/01/21-epic ?Cardiac Cath - no ?Pacemaker/ICD device last checked:NA ? ?Sleep Study - no ?CPAP -  ? ?Fasting Blood Sugar - Pt is pre diabetic and doesn't test ?Checks Blood Sugar _____ times a day ? ?Blood Thinner Instructions:asa 81/ Dr. Sheryn Bison ?Aspirin Instructions:Pt's brother will have the Pt stop 5 days prior to DOS ?Last Dose:05/22/21 ? ?Anesthesia review: yes ? ?Patient denies shortness of breath, fever, cough and chest pain at PAT appointment ?Pt reports some SOB with exertion but it isn't new for him. ? ?Patient verbalized understanding of instructions that were given to them at the PAT appointment. Patient was also instructed that they will need to review over the PAT instructions again at home before surgery. Yes. Instructions were given to the Pt and his brother. ?

## 2021-05-14 NOTE — Consult Note (Addendum)
[place] Nurse requested for preoperative stoma site marking ? ?Discussed surgical procedure and stoma creation with patient and family.  Explained role of the [place] nurse team.  Provided the patient with educational booklet and provided samples of pouching options.  Answered patient and family questions. Brother-in-law present for marking.  ? ?Examined patient sitting, and standing in order to place the marking in the patient's visual field, away from any creases or abdominal contour issues and within the rectus muscle.  Attempted to mark below the patient's belt line, wears pants very low.  ? ?Marked for colostomy in the LLQ  __7.2__ cm to the left of the umbilicus and ___5.1_WC above/below the umbilicus. ? ?Marked for ileostomy in the RLQ  _7.7___cm to the right of the umbilicus and  _5___ cm above/below the umbilicus. ? ?Patient's abdomen cleansed with CHG wipes at site markings, allowed to air dry prior to marking. Covered mark with thin film transparent dressing to preserve mark until date of surgery. Pen given to patient to keep marks on the abdomen until DOS. ? ?[place] Nurse team will follow up with patient after surgery for continue ostomy care and teaching.  ? ?Cathlean Marseilles Tamala Julian, MSN, RN, CMSRN, AGCNS, WTA ?Wound Treatment Associate ?Pager [phone]  ?  ?

## 2021-05-15 ENCOUNTER — Encounter: Payer: Self-pay | Admitting: Oncology

## 2021-05-15 DIAGNOSIS — H40033 Anatomical narrow angle, bilateral: Secondary | ICD-10-CM | POA: Diagnosis not present

## 2021-05-15 DIAGNOSIS — H2513 Age-related nuclear cataract, bilateral: Secondary | ICD-10-CM | POA: Diagnosis not present

## 2021-05-15 NOTE — Progress Notes (Addendum)
Anesthesia Chart Review ? ? Case: [id number] Date/Time: 05/27/21 0815  ? Procedures:  ?    ROBOTIC LOW ANTERIOR RECTOSIGMOID RESECTION ?    POSSIBLE OSTOMY ?    RIGID PROCTOSCOPY ?    CYSTOSCOPY with FIREFLY INJECTION  ? Anesthesia type: General  ? Pre-op diagnosis: RECTAL CANCER  ? Location: [facility] ROOM 02 / [facility]  ? Surgeons: Michael Boston, MD; Vira Agar, MD  ? ?  ? ? ?DISCUSSION:66 y.o. former smoker with h/o HTN, DM II diet controlled, CKD Stage III, hypertrophic cardiomyopathy, rectal cancer scheduled for above procedure 05/27/21 with Dr. Michael Boston and Dr. Terrilee Files.  ? ?Pt seen by cardiology 03/20/2021. Per OV note, "PREOP CLEARANCE: The patient has no high risk symptoms suggestive of obstructive coronary artery disease.  He has a reasonable functional level.  I do not think further preoperative testing is indicated other than the echo as below.  I will send a message to Dr. Johney [place] because the patient will need to be watched carefully for volume management with his ventricular hypertrophy.  Have to be monitored for arrhythmias.  It may be prudent to do his surgery [facility] if possible." ? ?Echo 04/01/2021 with EF 55-60%, hypertrophic cardiomyopathy, no valvular problems.  ? ?Diet controlled diabetes, elevated blood glucose at PAT visit (non fasting). Evaluate DOS.  A1C forwarded to PCP. ? ?Anticipate pt can proceed with planned procedure barring acute status change.   ?VS: BP 111/78   Pulse 69   Temp 36.8 ?C (Oral)   Resp 16   Ht 6' (1.829 m)   Wt 108.9 kg   SpO2 97%   BMI 32.55 kg/m?  ? ?PROVIDERS: ?Aura Dials, MD is PCP  ? ?Minus Breeding, MD is Cardiologist  ?LABS: Labs reviewed: Acceptable for surgery. ?(all labs ordered are listed, but only abnormal results are displayed) ? ?Labs Reviewed  ?BASIC METABOLIC PANEL - Abnormal; Notable for the following components:  ?    Result Value  ? Glucose, Bld 310 (*)   ? Creatinine, Ser 1.25 (*)   ? Calcium 8.8 (*)   ? All other components within  normal limits  ?CBC - Abnormal; Notable for the following components:  ? Platelets 131 (*)   ? All other components within normal limits  ?HEMOGLOBIN A1C - Abnormal; Notable for the following components:  ? Hgb A1c MFr Bld 7.3 (*)   ? All other components within normal limits  ?GLUCOSE, CAPILLARY - Abnormal; Notable for the following components:  ? Glucose-Capillary 275 (*)   ? All other components within normal limits  ?TYPE AND SCREEN  ? ? ? ?IMAGES: ? ? ?EKG: ?03/20/2021 ?Rate 70 bpm  ?Sinus rhythm with 1st degree AV block  ?Marked ST abnormality, possible anterior subendocardial injury ? ?CV: ?Echo 04/01/2021 ? 1. There is severe asymmetric hypertrophy of the apical segments (up to  ?18 mm) consistent with apical variant hypertrophic cardiomyopathy. There  ?is concern for an apical aneurysm on this study, however this was a  ?difficult study. Would recommend cardiac  ? MRI for better characterization. Left ventricular ejection fraction, by  ?estimation, is 55 to 60%. The left ventricle has normal function. The left  ?ventricle has no regional wall motion abnormalities. There is severe  ?asymmetric left ventricular  ?hypertrophy of the apical segment. Left ventricular diastolic parameters  ?are consistent with Grade I diastolic dysfunction (impaired relaxation).  ? 2. Right ventricular systolic function is normal. The right ventricular  ?size is normal. Tricuspid regurgitation signal  is inadequate for assessing  ?PA pressure.  ? 3. The mitral valve is grossly normal. Trivial mitral valve  ?regurgitation. No evidence of mitral stenosis.  ? 4. The aortic valve is grossly normal. Aortic valve regurgitation is not  ?visualized. No aortic stenosis is present.  ? 5. The inferior vena cava is normal in size with greater than 50%  ?respiratory variability, suggesting right atrial pressure of 3 mmHg.  ?Past Medical History:  ?Diagnosis Date  ? Back pain   ? Bipolar disorder ([place])   ? bipolar 1  ? Hypertension   ? Knee pain    ? Migraine   ? Pre-diabetes   ? Rectal cancer ([place])   ? ? ?Past Surgical History:  ?Procedure Laterality Date  ? BACK SURGERY    ? JOINT REPLACEMENT Right   ? knee  ? PORTACATH PLACEMENT N/A 10/02/2020  ? Procedure: PORT-A-CATH PLACEMENT;  Surgeon: Dwan Bolt, MD;  Location: [facility];  Service: General;  Laterality: N/A;  ? ? ?MEDICATIONS: ? acetaminophen (TYLENOL) 325 MG tablet  ? albuterol (VENTOLIN HFA) 108 (90 Base) MCG/ACT inhaler  ? ALPRAZolam (XANAX) 1 MG tablet  ? aspirin EC 81 MG tablet  ? capecitabine (XELODA) 500 MG tablet  ? Cholecalciferol (VITAMIN D3) 50 MCG (2000 UT) capsule  ? docusate sodium (COLACE) 100 MG capsule  ? HYDROcodone-acetaminophen (NORCO/VICODIN) 5-325 MG tablet  ? lidocaine-prilocaine (EMLA) cream  ? loperamide (IMODIUM) 2 MG capsule  ? LOTENSIN HCT 20-12.5 MG per tablet  ? magnesium oxide (MAG-OX) 400 (240 Mg) MG tablet  ? mirtazapine (REMERON) 45 MG tablet  ? omeprazole (PRILOSEC) 20 MG capsule  ? ondansetron (ZOFRAN) 8 MG tablet  ? polyethylene glycol powder (GLYCOLAX/MIRALAX) 17 GM/SCOOP powder  ? potassium chloride SA (KLOR-CON M) 20 MEQ tablet  ? pravastatin (PRAVACHOL) 40 MG tablet  ? prochlorperazine (COMPAZINE) 10 MG tablet  ? propranolol ER (INDERAL LA) 160 MG SR capsule  ? risperiDONE (RISPERDAL) 0.25 MG tablet  ? SUMAtriptan (IMITREX) 100 MG tablet  ? ?No current facility-administered medications for this encounter.  ? ? ?Konrad Felix Ward, PA-C ?[facility]-Surgical [facility] ?[phone] ? ? ? ? ? ? ?

## 2021-05-15 NOTE — Anesthesia Preprocedure Evaluation (Signed)
Anesthesia Evaluation    Airway        Dental   Pulmonary Patient abstained from smoking., former smoker,           Cardiovascular hypertension,      Neuro/Psych    GI/Hepatic   Endo/Other    Renal/GU      Musculoskeletal   Abdominal   Peds  Hematology   Anesthesia Other Findings   Reproductive/Obstetrics                             Anesthesia Physical Anesthesia Plan  ASA:   Anesthesia Plan:    Post-op Pain Management:    Induction:   PONV Risk Score and Plan:   Airway Management Planned:   Additional Equipment:   Intra-op Plan:   Post-operative Plan:   Informed Consent:   Plan Discussed with:   Anesthesia Plan Comments: (See PAT note 05/14/2021, Konrad Felix Ward, PA-C)        Anesthesia Quick Evaluation

## 2021-05-25 ENCOUNTER — Encounter (HOSPITAL_COMMUNITY): Payer: Medicare Other

## 2021-05-26 ENCOUNTER — Other Ambulatory Visit: Payer: Self-pay | Admitting: Oncology

## 2021-05-26 DIAGNOSIS — C20 Malignant neoplasm of rectum: Secondary | ICD-10-CM

## 2021-05-27 ENCOUNTER — Inpatient Hospital Stay (HOSPITAL_COMMUNITY)
Admission: RE | Admit: 2021-05-27 | Discharge: 2021-06-05 | DRG: 330 | Disposition: A | Discharge status: Skilled Nursing Facility | Payer: Medicare Other | Source: Ambulatory Visit | Attending: Surgery | Admitting: Surgery

## 2021-05-27 ENCOUNTER — Inpatient Hospital Stay (HOSPITAL_COMMUNITY): Payer: Medicare Other | Admitting: Physician Assistant

## 2021-05-27 ENCOUNTER — Encounter (HOSPITAL_COMMUNITY)
Admission: RE | Disposition: A | Discharge status: Skilled Nursing Facility | Payer: Self-pay | Source: Ambulatory Visit | Attending: Surgery

## 2021-05-27 ENCOUNTER — Other Ambulatory Visit: Payer: Self-pay

## 2021-05-27 ENCOUNTER — Encounter (HOSPITAL_COMMUNITY): Payer: Self-pay | Admitting: Surgery

## 2021-05-27 ENCOUNTER — Inpatient Hospital Stay (HOSPITAL_COMMUNITY): Payer: Medicare Other | Admitting: Certified Registered"

## 2021-05-27 DIAGNOSIS — E118 Type 2 diabetes mellitus with unspecified complications: Secondary | ICD-10-CM | POA: Diagnosis not present

## 2021-05-27 DIAGNOSIS — G43909 Migraine, unspecified, not intractable, without status migrainosus: Secondary | ICD-10-CM | POA: Diagnosis present

## 2021-05-27 DIAGNOSIS — I700 Atherosclerosis of aorta: Secondary | ICD-10-CM | POA: Diagnosis not present

## 2021-05-27 DIAGNOSIS — R0602 Shortness of breath: Secondary | ICD-10-CM | POA: Diagnosis not present

## 2021-05-27 DIAGNOSIS — N1831 Chronic kidney disease, stage 3a: Secondary | ICD-10-CM | POA: Diagnosis not present

## 2021-05-27 DIAGNOSIS — Z720 Tobacco use: Secondary | ICD-10-CM | POA: Insufficient documentation

## 2021-05-27 DIAGNOSIS — Z8249 Family history of ischemic heart disease and other diseases of the circulatory system: Secondary | ICD-10-CM

## 2021-05-27 DIAGNOSIS — N182 Chronic kidney disease, stage 2 (mild): Secondary | ICD-10-CM | POA: Diagnosis not present

## 2021-05-27 DIAGNOSIS — N179 Acute kidney failure, unspecified: Secondary | ICD-10-CM | POA: Diagnosis present

## 2021-05-27 DIAGNOSIS — M549 Dorsalgia, unspecified: Secondary | ICD-10-CM | POA: Diagnosis present

## 2021-05-27 DIAGNOSIS — F17210 Nicotine dependence, cigarettes, uncomplicated: Secondary | ICD-10-CM | POA: Diagnosis not present

## 2021-05-27 DIAGNOSIS — E1122 Type 2 diabetes mellitus with diabetic chronic kidney disease: Secondary | ICD-10-CM | POA: Diagnosis present

## 2021-05-27 DIAGNOSIS — I10 Essential (primary) hypertension: Secondary | ICD-10-CM | POA: Diagnosis not present

## 2021-05-27 DIAGNOSIS — G8929 Other chronic pain: Secondary | ICD-10-CM | POA: Diagnosis not present

## 2021-05-27 DIAGNOSIS — I2510 Atherosclerotic heart disease of native coronary artery without angina pectoris: Secondary | ICD-10-CM | POA: Diagnosis present

## 2021-05-27 DIAGNOSIS — R279 Unspecified lack of coordination: Secondary | ICD-10-CM | POA: Diagnosis not present

## 2021-05-27 DIAGNOSIS — Z7982 Long term (current) use of aspirin: Secondary | ICD-10-CM | POA: Diagnosis not present

## 2021-05-27 DIAGNOSIS — F319 Bipolar disorder, unspecified: Secondary | ICD-10-CM | POA: Diagnosis present

## 2021-05-27 DIAGNOSIS — G2581 Restless legs syndrome: Secondary | ICD-10-CM | POA: Diagnosis not present

## 2021-05-27 DIAGNOSIS — Z820 Family history of epilepsy and other diseases of the nervous system: Secondary | ICD-10-CM | POA: Diagnosis not present

## 2021-05-27 DIAGNOSIS — E1165 Type 2 diabetes mellitus with hyperglycemia: Secondary | ICD-10-CM

## 2021-05-27 DIAGNOSIS — Z923 Personal history of irradiation: Secondary | ICD-10-CM

## 2021-05-27 DIAGNOSIS — E559 Vitamin D deficiency, unspecified: Secondary | ICD-10-CM | POA: Diagnosis not present

## 2021-05-27 DIAGNOSIS — C20 Malignant neoplasm of rectum: Secondary | ICD-10-CM

## 2021-05-27 DIAGNOSIS — Z6832 Body mass index (BMI) 32.0-32.9, adult: Secondary | ICD-10-CM | POA: Diagnosis not present

## 2021-05-27 DIAGNOSIS — E1159 Type 2 diabetes mellitus with other circulatory complications: Secondary | ICD-10-CM | POA: Insufficient documentation

## 2021-05-27 DIAGNOSIS — E860 Dehydration: Secondary | ICD-10-CM | POA: Diagnosis not present

## 2021-05-27 DIAGNOSIS — Z741 Need for assistance with personal care: Secondary | ICD-10-CM | POA: Diagnosis not present

## 2021-05-27 DIAGNOSIS — E785 Hyperlipidemia, unspecified: Secondary | ICD-10-CM | POA: Diagnosis not present

## 2021-05-27 DIAGNOSIS — M6281 Muscle weakness (generalized): Secondary | ICD-10-CM | POA: Diagnosis not present

## 2021-05-27 DIAGNOSIS — K219 Gastro-esophageal reflux disease without esophagitis: Secondary | ICD-10-CM | POA: Diagnosis present

## 2021-05-27 DIAGNOSIS — Z978 Presence of other specified devices: Secondary | ICD-10-CM | POA: Diagnosis not present

## 2021-05-27 DIAGNOSIS — E1129 Type 2 diabetes mellitus with other diabetic kidney complication: Secondary | ICD-10-CM | POA: Diagnosis not present

## 2021-05-27 DIAGNOSIS — R339 Retention of urine, unspecified: Secondary | ICD-10-CM | POA: Diagnosis not present

## 2021-05-27 DIAGNOSIS — R198 Other specified symptoms and signs involving the digestive system and abdomen: Secondary | ICD-10-CM | POA: Diagnosis not present

## 2021-05-27 DIAGNOSIS — Z9181 History of falling: Secondary | ICD-10-CM | POA: Diagnosis not present

## 2021-05-27 DIAGNOSIS — K649 Unspecified hemorrhoids: Secondary | ICD-10-CM | POA: Diagnosis present

## 2021-05-27 DIAGNOSIS — Z432 Encounter for attention to ileostomy: Secondary | ICD-10-CM | POA: Diagnosis not present

## 2021-05-27 DIAGNOSIS — Z932 Ileostomy status: Secondary | ICD-10-CM | POA: Diagnosis not present

## 2021-05-27 DIAGNOSIS — Q613 Polycystic kidney, unspecified: Secondary | ICD-10-CM | POA: Diagnosis not present

## 2021-05-27 DIAGNOSIS — E669 Obesity, unspecified: Secondary | ICD-10-CM | POA: Diagnosis present

## 2021-05-27 DIAGNOSIS — Z7952 Long term (current) use of systemic steroids: Secondary | ICD-10-CM | POA: Diagnosis not present

## 2021-05-27 DIAGNOSIS — G43009 Migraine without aura, not intractable, without status migrainosus: Secondary | ICD-10-CM | POA: Diagnosis not present

## 2021-05-27 DIAGNOSIS — F419 Anxiety disorder, unspecified: Secondary | ICD-10-CM | POA: Diagnosis present

## 2021-05-27 DIAGNOSIS — R262 Difficulty in walking, not elsewhere classified: Secondary | ICD-10-CM | POA: Diagnosis not present

## 2021-05-27 DIAGNOSIS — Z9221 Personal history of antineoplastic chemotherapy: Secondary | ICD-10-CM

## 2021-05-27 DIAGNOSIS — N183 Chronic kidney disease, stage 3 unspecified: Secondary | ICD-10-CM | POA: Diagnosis not present

## 2021-05-27 DIAGNOSIS — Z7401 Bed confinement status: Secondary | ICD-10-CM | POA: Diagnosis not present

## 2021-05-27 DIAGNOSIS — R1312 Dysphagia, oropharyngeal phase: Secondary | ICD-10-CM | POA: Diagnosis not present

## 2021-05-27 DIAGNOSIS — I129 Hypertensive chronic kidney disease with stage 1 through stage 4 chronic kidney disease, or unspecified chronic kidney disease: Secondary | ICD-10-CM | POA: Diagnosis not present

## 2021-05-27 DIAGNOSIS — Z79899 Other long term (current) drug therapy: Secondary | ICD-10-CM

## 2021-05-27 DIAGNOSIS — K6389 Other specified diseases of intestine: Secondary | ICD-10-CM | POA: Diagnosis not present

## 2021-05-27 DIAGNOSIS — R2681 Unsteadiness on feet: Secondary | ICD-10-CM | POA: Diagnosis not present

## 2021-05-27 DIAGNOSIS — Z743 Need for continuous supervision: Secondary | ICD-10-CM | POA: Diagnosis not present

## 2021-05-27 DIAGNOSIS — R404 Transient alteration of awareness: Secondary | ICD-10-CM | POA: Diagnosis not present

## 2021-05-27 HISTORY — PX: PROCTOSCOPY: SHX2266

## 2021-05-27 HISTORY — PX: XI ROBOTIC ASSISTED LOWER ANTERIOR RESECTION: SHX6558

## 2021-05-27 HISTORY — PX: OSTOMY: SHX5997

## 2021-05-27 LAB — GLUCOSE, CAPILLARY
Glucose-Capillary: 257 mg/dL — ABNORMAL HIGH (ref 70–99)
Glucose-Capillary: 299 mg/dL — ABNORMAL HIGH (ref 70–99)
Glucose-Capillary: 255 mg/dL — ABNORMAL HIGH (ref 70–99)

## 2021-05-27 LAB — ABO/RH: ABO/RH(D): O POS

## 2021-05-27 SURGERY — RESECTION, RECTUM, LOW ANTERIOR, ROBOT-ASSISTED
Anesthesia: General

## 2021-05-27 MED ORDER — ONDANSETRON HCL 4 MG PO TABS
4.0000 mg | ORAL_TABLET | Freq: Four times a day (QID) | ORAL | Status: DC | PRN
  Administered 2021-05-29 (×2): 4 mg via ORAL
  Filled 2021-05-27 (×2): qty 1

## 2021-05-27 MED ORDER — VITAMIN D3 25 MCG (1000 UNIT) PO TABS
2000.0000 [IU] | ORAL_TABLET | Freq: Every day | ORAL | Status: DC
  Administered 2021-05-28 – 2021-06-05 (×9): 2000 [IU] via ORAL
  Filled 2021-05-27 (×9): qty 2

## 2021-05-27 MED ORDER — PROPOFOL 10 MG/ML IV BOLUS
INTRAVENOUS | Status: AC
  Filled 2021-05-27: qty 20

## 2021-05-27 MED ORDER — BUPIVACAINE LIPOSOME 1.3 % IJ SUSP
INTRAMUSCULAR | Status: DC | PRN
  Administered 2021-05-27: 20 mL

## 2021-05-27 MED ORDER — LACTATED RINGERS IV BOLUS
1000.0000 mL | Freq: Three times a day (TID) | INTRAVENOUS | Status: AC | PRN
Start: 2021-05-27 — End: 2021-05-29

## 2021-05-27 MED ORDER — INSULIN ASPART 100 UNIT/ML IJ SOLN
0.0000 [IU] | Freq: Every day | INTRAMUSCULAR | Status: DC
  Administered 2021-05-27: 3 [IU] via SUBCUTANEOUS
  Administered 2021-05-30: 2 [IU] via SUBCUTANEOUS

## 2021-05-27 MED ORDER — AMISULPRIDE (ANTIEMETIC) 5 MG/2ML IV SOLN: 10.0000 mg | Freq: Once | INTRAVENOUS | Status: DC | PRN

## 2021-05-27 MED ORDER — MIRTAZAPINE 15 MG PO TABS
45.0000 mg | ORAL_TABLET | Freq: Every day | ORAL | Status: DC
  Administered 2021-05-27 – 2021-05-29 (×3): 45 mg via ORAL
  Filled 2021-05-27 (×3): qty 3

## 2021-05-27 MED ORDER — ALUM & MAG HYDROXIDE-SIMETH 200-200-20 MG/5ML PO SUSP: 30.0000 mL | Freq: Four times a day (QID) | ORAL | Status: DC | PRN

## 2021-05-27 MED ORDER — PROPRANOLOL HCL ER 80 MG PO CP24
160.0000 mg | ORAL_CAPSULE | Freq: Every day | ORAL | Status: DC
  Administered 2021-05-28: 160 mg via ORAL
  Administered 2021-05-28: 80 mg via ORAL
  Administered 2021-05-29 – 2021-06-02 (×5): 160 mg via ORAL
  Filled 2021-05-27 (×4): qty 2
  Filled 2021-05-27: qty 1
  Filled 2021-05-27 (×3): qty 2

## 2021-05-27 MED ORDER — PHENYLEPHRINE 40 MCG/ML (10ML) SYRINGE FOR IV PUSH (FOR BLOOD PRESSURE SUPPORT)
PREFILLED_SYRINGE | INTRAVENOUS | Status: DC | PRN
  Administered 2021-05-27: 120 ug via INTRAVENOUS
  Administered 2021-05-27 (×2): 80 ug via INTRAVENOUS

## 2021-05-27 MED ORDER — ALPRAZOLAM 1 MG PO TABS
1.0000 mg | ORAL_TABLET | Freq: Two times a day (BID) | ORAL | Status: DC
  Administered 2021-05-27 – 2021-06-01 (×10): 1 mg via ORAL
  Filled 2021-05-27 (×10): qty 1

## 2021-05-27 MED ORDER — PROPOFOL 10 MG/ML IV BOLUS
INTRAVENOUS | Status: DC | PRN
Start: 2021-05-27 — End: 2021-05-27
  Administered 2021-05-27: 150 mg via INTRAVENOUS

## 2021-05-27 MED ORDER — ONDANSETRON HCL 4 MG/2ML IJ SOLN
INTRAMUSCULAR | Status: DC | PRN
  Administered 2021-05-27: 4 mg via INTRAVENOUS

## 2021-05-27 MED ORDER — ACETAMINOPHEN 500 MG PO TABS
1000.0000 mg | ORAL_TABLET | ORAL | Status: AC
  Administered 2021-05-27: 1000 mg via ORAL
  Filled 2021-05-27: qty 2

## 2021-05-27 MED ORDER — GLYCOPYRROLATE 0.2 MG/ML IJ SOLN
INTRAMUSCULAR | Status: AC
  Filled 2021-05-27: qty 1

## 2021-05-27 MED ORDER — INDOCYANINE GREEN 25 MG IV SOLR
INTRAVENOUS | Status: DC | PRN
  Administered 2021-05-27: 6.25 mg via INTRAVENOUS

## 2021-05-27 MED ORDER — PROMETHAZINE HCL 25 MG/ML IJ SOLN: 6.2500 mg | INTRAMUSCULAR | Status: DC | PRN

## 2021-05-27 MED ORDER — ROCURONIUM BROMIDE 10 MG/ML (PF) SYRINGE
PREFILLED_SYRINGE | INTRAVENOUS | Status: AC
  Filled 2021-05-27: qty 10

## 2021-05-27 MED ORDER — DEXAMETHASONE SODIUM PHOSPHATE 10 MG/ML IJ SOLN
INTRAMUSCULAR | Status: DC | PRN
  Administered 2021-05-27: 10 mg via INTRAVENOUS

## 2021-05-27 MED ORDER — ONDANSETRON HCL 4 MG/2ML IJ SOLN
4.0000 mg | Freq: Four times a day (QID) | INTRAMUSCULAR | Status: DC | PRN
Start: 2021-05-27 — End: 2021-06-05
  Administered 2021-05-28 – 2021-05-30 (×4): 4 mg via INTRAVENOUS
  Filled 2021-05-27 (×4): qty 2

## 2021-05-27 MED ORDER — ONDANSETRON HCL 4 MG/2ML IJ SOLN
INTRAMUSCULAR | Status: AC
  Filled 2021-05-27: qty 2

## 2021-05-27 MED ORDER — ENOXAPARIN SODIUM 40 MG/0.4ML IJ SOSY
40.0000 mg | PREFILLED_SYRINGE | Freq: Once | INTRAMUSCULAR | Status: AC
  Administered 2021-05-27: 40 mg via SUBCUTANEOUS
  Filled 2021-05-27: qty 0.4

## 2021-05-27 MED ORDER — PANTOPRAZOLE SODIUM 40 MG PO TBEC
40.0000 mg | DELAYED_RELEASE_TABLET | Freq: Every day | ORAL | Status: DC
  Administered 2021-05-27 – 2021-06-05 (×10): 40 mg via ORAL
  Filled 2021-05-27 (×10): qty 1

## 2021-05-27 MED ORDER — MAGIC MOUTHWASH
15.0000 mL | Freq: Four times a day (QID) | ORAL | Status: DC | PRN
Start: 2021-05-27 — End: 2021-06-05
  Filled 2021-05-27: qty 15

## 2021-05-27 MED ORDER — OXYCODONE HCL 5 MG/5ML PO SOLN: 5.0000 mg | Freq: Once | ORAL | Status: DC | PRN

## 2021-05-27 MED ORDER — INSULIN ASPART 100 UNIT/ML IJ SOLN
INTRAMUSCULAR | Status: AC
  Filled 2021-05-27: qty 1

## 2021-05-27 MED ORDER — ENSURE PRE-SURGERY PO LIQD: 296.0000 mL | Freq: Once | ORAL | Status: DC

## 2021-05-27 MED ORDER — ORAL CARE MOUTH RINSE: 15.0000 mL | Freq: Once | OROMUCOSAL | Status: AC

## 2021-05-27 MED ORDER — SUMATRIPTAN SUCCINATE 50 MG PO TABS
100.0000 mg | ORAL_TABLET | Freq: Two times a day (BID) | ORAL | Status: DC | PRN
  Filled 2021-05-27: qty 2

## 2021-05-27 MED ORDER — LACTATED RINGERS IV SOLN: INTRAVENOUS | Status: DC

## 2021-05-27 MED ORDER — ENALAPRILAT 1.25 MG/ML IV SOLN
0.6250 mg | Freq: Four times a day (QID) | INTRAVENOUS | Status: DC | PRN
  Filled 2021-05-27: qty 1

## 2021-05-27 MED ORDER — EPHEDRINE SULFATE-NACL 50-0.9 MG/10ML-% IV SOSY
PREFILLED_SYRINGE | INTRAVENOUS | Status: DC | PRN
  Administered 2021-05-27 (×3): 5 mg via INTRAVENOUS

## 2021-05-27 MED ORDER — SUGAMMADEX SODIUM 200 MG/2ML IV SOLN
INTRAVENOUS | Status: DC | PRN
Start: 2021-05-27 — End: 2021-05-27
  Administered 2021-05-27: 200 mg via INTRAVENOUS

## 2021-05-27 MED ORDER — HYDROMORPHONE HCL 1 MG/ML IJ SOLN
INTRAMUSCULAR | Status: AC
  Filled 2021-05-27: qty 1

## 2021-05-27 MED ORDER — HYDROMORPHONE HCL 1 MG/ML IJ SOLN
0.5000 mg | INTRAMUSCULAR | Status: DC | PRN
  Administered 2021-05-29 – 2021-05-30 (×5): 1 mg via INTRAVENOUS
  Filled 2021-05-27 (×3): qty 1
  Filled 2021-05-27: qty 2
  Filled 2021-05-27: qty 1

## 2021-05-27 MED ORDER — FENTANYL CITRATE (PF) 250 MCG/5ML IJ SOLN
INTRAMUSCULAR | Status: DC | PRN
  Administered 2021-05-27: 100 ug via INTRAVENOUS
  Administered 2021-05-27 (×3): 50 ug via INTRAVENOUS

## 2021-05-27 MED ORDER — CHLORHEXIDINE GLUCONATE CLOTH 2 % EX PADS: 6.0000 | MEDICATED_PAD | Freq: Once | CUTANEOUS | Status: DC

## 2021-05-27 MED ORDER — ALVIMOPAN 12 MG PO CAPS
12.0000 mg | ORAL_CAPSULE | Freq: Two times a day (BID) | ORAL | Status: DC
  Administered 2021-05-28 – 2021-05-30 (×4): 12 mg via ORAL
  Filled 2021-05-27 (×5): qty 1

## 2021-05-27 MED ORDER — LIDOCAINE 2% (20 MG/ML) 5 ML SYRINGE
INTRAMUSCULAR | Status: DC | PRN
  Administered 2021-05-27: 60 mg via INTRAVENOUS

## 2021-05-27 MED ORDER — LIP MEDEX EX OINT
1.0000 "application " | TOPICAL_OINTMENT | Freq: Two times a day (BID) | CUTANEOUS | Status: DC
  Administered 2021-05-27 – 2021-06-05 (×18): 1 via TOPICAL
  Filled 2021-05-27 (×2): qty 7

## 2021-05-27 MED ORDER — FENTANYL CITRATE (PF) 250 MCG/5ML IJ SOLN
INTRAMUSCULAR | Status: AC
  Filled 2021-05-27: qty 5

## 2021-05-27 MED ORDER — MELATONIN 3 MG PO TABS
3.0000 mg | ORAL_TABLET | Freq: Every evening | ORAL | Status: DC | PRN
Start: 2021-05-27 — End: 2021-06-05

## 2021-05-27 MED ORDER — METHOCARBAMOL 1000 MG/10ML IJ SOLN
1000.0000 mg | Freq: Four times a day (QID) | INTRAVENOUS | Status: DC | PRN
  Filled 2021-05-27: qty 10

## 2021-05-27 MED ORDER — EPHEDRINE 5 MG/ML INJ
INTRAVENOUS | Status: AC
  Filled 2021-05-27: qty 5

## 2021-05-27 MED ORDER — LACTATED RINGERS IR SOLN
Status: DC | PRN
  Administered 2021-05-27: 1000 mL

## 2021-05-27 MED ORDER — PHENYLEPHRINE 40 MCG/ML (10ML) SYRINGE FOR IV PUSH (FOR BLOOD PRESSURE SUPPORT)
PREFILLED_SYRINGE | INTRAVENOUS | Status: AC
  Filled 2021-05-27: qty 10

## 2021-05-27 MED ORDER — ENSURE PRE-SURGERY PO LIQD: 592.0000 mL | Freq: Once | ORAL | Status: DC

## 2021-05-27 MED ORDER — ACETAMINOPHEN 500 MG PO TABS
1000.0000 mg | ORAL_TABLET | Freq: Four times a day (QID) | ORAL | Status: DC
  Administered 2021-05-27 – 2021-06-05 (×32): 1000 mg via ORAL
  Filled 2021-05-27 (×34): qty 2

## 2021-05-27 MED ORDER — BUPIVACAINE-EPINEPHRINE (PF) 0.25% -1:200000 IJ SOLN
INTRAMUSCULAR | Status: AC
  Filled 2021-05-27: qty 60

## 2021-05-27 MED ORDER — ALVIMOPAN 12 MG PO CAPS
12.0000 mg | ORAL_CAPSULE | ORAL | Status: AC
  Administered 2021-05-27: 12 mg via ORAL
  Filled 2021-05-27: qty 1

## 2021-05-27 MED ORDER — ROCURONIUM BROMIDE 10 MG/ML (PF) SYRINGE
PREFILLED_SYRINGE | INTRAVENOUS | Status: DC | PRN
  Administered 2021-05-27: 20 mg via INTRAVENOUS
  Administered 2021-05-27: 100 mg via INTRAVENOUS
  Administered 2021-05-27 (×2): 20 mg via INTRAVENOUS
  Administered 2021-05-27: 10 mg via INTRAVENOUS
  Administered 2021-05-27: 20 mg via INTRAVENOUS

## 2021-05-27 MED ORDER — STERILE WATER FOR INJECTION IJ SOLN
INTRAMUSCULAR | Status: AC
  Filled 2021-05-27: qty 10

## 2021-05-27 MED ORDER — CHLORHEXIDINE GLUCONATE CLOTH 2 % EX PADS
6.0000 | MEDICATED_PAD | Freq: Once | CUTANEOUS | Status: AC
  Administered 2021-05-31: 6 via TOPICAL

## 2021-05-27 MED ORDER — BUPIVACAINE-EPINEPHRINE (PF) 0.25% -1:200000 IJ SOLN
INTRAMUSCULAR | Status: DC | PRN
  Administered 2021-05-27: 60 mL via PERINEURAL

## 2021-05-27 MED ORDER — ASPIRIN EC 81 MG PO TBEC
81.0000 mg | DELAYED_RELEASE_TABLET | Freq: Every day | ORAL | Status: DC
  Administered 2021-05-28 – 2021-06-05 (×9): 81 mg via ORAL
  Filled 2021-05-27 (×9): qty 1

## 2021-05-27 MED ORDER — DEXAMETHASONE SODIUM PHOSPHATE 10 MG/ML IJ SOLN
INTRAMUSCULAR | Status: AC
  Filled 2021-05-27: qty 1

## 2021-05-27 MED ORDER — KETAMINE HCL 50 MG/5ML IJ SOSY
PREFILLED_SYRINGE | INTRAMUSCULAR | Status: AC
  Filled 2021-05-27: qty 5

## 2021-05-27 MED ORDER — KETAMINE HCL 10 MG/ML IJ SOLN
INTRAMUSCULAR | Status: DC | PRN
Start: 2021-05-27 — End: 2021-05-27
  Administered 2021-05-27: 5 mg via INTRAVENOUS
  Administered 2021-05-27: 10 mg via INTRAVENOUS
  Administered 2021-05-27: 15 mg via INTRAVENOUS
  Administered 2021-05-27 (×2): 10 mg via INTRAVENOUS

## 2021-05-27 MED ORDER — LIDOCAINE 20MG/ML (2%) 15 ML SYRINGE OPTIME
INTRAMUSCULAR | Status: DC | PRN
  Administered 2021-05-27: 1.5 mg/kg/h via INTRAVENOUS

## 2021-05-27 MED ORDER — OXYCODONE HCL 5 MG PO TABS: 5.0000 mg | ORAL_TABLET | Freq: Once | ORAL | Status: DC | PRN

## 2021-05-27 MED ORDER — LACTATED RINGERS IV SOLN: INTRAVENOUS | Status: AC

## 2021-05-27 MED ORDER — EPINEPHRINE 1 MG/10ML IJ SOSY
PREFILLED_SYRINGE | INTRAMUSCULAR | Status: AC
  Filled 2021-05-27: qty 10

## 2021-05-27 MED ORDER — PROCHLORPERAZINE EDISYLATE 10 MG/2ML IJ SOLN
5.0000 mg | Freq: Four times a day (QID) | INTRAMUSCULAR | Status: DC | PRN
  Filled 2021-05-27: qty 2

## 2021-05-27 MED ORDER — POLYETHYLENE GLYCOL 3350 17 GM/SCOOP PO POWD: 1.0000 | Freq: Once | ORAL | Status: DC

## 2021-05-27 MED ORDER — ALBUTEROL SULFATE HFA 108 (90 BASE) MCG/ACT IN AERS
2.0000 | INHALATION_SPRAY | Freq: Four times a day (QID) | RESPIRATORY_TRACT | Status: DC | PRN
  Administered 2021-06-03: 2 via RESPIRATORY_TRACT
  Filled 2021-05-27 (×2): qty 6.7

## 2021-05-27 MED ORDER — BISACODYL 5 MG PO TBEC: 20.0000 mg | DELAYED_RELEASE_TABLET | Freq: Once | ORAL | Status: DC

## 2021-05-27 MED ORDER — METHOCARBAMOL 500 MG PO TABS
1000.0000 mg | ORAL_TABLET | Freq: Four times a day (QID) | ORAL | Status: DC | PRN
Start: 2021-05-27 — End: 2021-05-30
  Administered 2021-05-28 – 2021-05-30 (×3): 1000 mg via ORAL
  Filled 2021-05-27 (×3): qty 2

## 2021-05-27 MED ORDER — CHLORHEXIDINE GLUCONATE 0.12 % MT SOLN
15.0000 mL | Freq: Once | OROMUCOSAL | Status: AC
  Administered 2021-05-27: 15 mL via OROMUCOSAL

## 2021-05-27 MED ORDER — METOPROLOL TARTRATE 5 MG/5ML IV SOLN: 5.0000 mg | Freq: Four times a day (QID) | INTRAVENOUS | Status: DC | PRN

## 2021-05-27 MED ORDER — BUPIVACAINE LIPOSOME 1.3 % IJ SUSP
20.0000 mL | Freq: Once | INTRAMUSCULAR | Status: DC
Start: 2021-05-27 — End: 2021-05-27

## 2021-05-27 MED ORDER — NEOMYCIN SULFATE 500 MG PO TABS: 1000.0000 mg | ORAL_TABLET | ORAL | Status: DC

## 2021-05-27 MED ORDER — STERILE WATER FOR INJECTION IJ SOLN
INTRAMUSCULAR | Status: DC | PRN
  Administered 2021-05-27: 15 mL via INTRAVESICAL

## 2021-05-27 MED ORDER — CALCIUM POLYCARBOPHIL 625 MG PO TABS
625.0000 mg | ORAL_TABLET | Freq: Two times a day (BID) | ORAL | Status: DC
  Administered 2021-05-27 – 2021-06-05 (×18): 625 mg via ORAL
  Filled 2021-05-27 (×18): qty 1

## 2021-05-27 MED ORDER — INSULIN ASPART 100 UNIT/ML IJ SOLN
0.0000 [IU] | Freq: Three times a day (TID) | INTRAMUSCULAR | Status: DC
  Administered 2021-05-27 (×2): 8 [IU] via SUBCUTANEOUS
  Administered 2021-05-28 – 2021-05-29 (×5): 3 [IU] via SUBCUTANEOUS
  Administered 2021-05-29: 5 [IU] via SUBCUTANEOUS
  Administered 2021-05-30 – 2021-05-31 (×5): 3 [IU] via SUBCUTANEOUS
  Administered 2021-05-31 – 2021-06-01 (×3): 2 [IU] via SUBCUTANEOUS
  Administered 2021-06-01 – 2021-06-02 (×2): 3 [IU] via SUBCUTANEOUS
  Administered 2021-06-02: 2 [IU] via SUBCUTANEOUS
  Administered 2021-06-03 – 2021-06-05 (×7): 3 [IU] via SUBCUTANEOUS

## 2021-05-27 MED ORDER — TRAMADOL HCL 50 MG PO TABS
50.0000 mg | ORAL_TABLET | Freq: Four times a day (QID) | ORAL | Status: DC | PRN
  Administered 2021-05-27 – 2021-05-30 (×6): 100 mg via ORAL
  Filled 2021-05-27 (×6): qty 2

## 2021-05-27 MED ORDER — ENOXAPARIN SODIUM 40 MG/0.4ML IJ SOSY
40.0000 mg | PREFILLED_SYRINGE | INTRAMUSCULAR | Status: DC
  Administered 2021-05-28 – 2021-06-05 (×9): 40 mg via SUBCUTANEOUS
  Filled 2021-05-27 (×8): qty 0.4

## 2021-05-27 MED ORDER — RISPERIDONE 0.25 MG PO TABS
0.2500 mg | ORAL_TABLET | Freq: Every day | ORAL | Status: DC
  Administered 2021-05-27 – 2021-06-04 (×9): 0.25 mg via ORAL
  Filled 2021-05-27 (×9): qty 1

## 2021-05-27 MED ORDER — PRAVASTATIN SODIUM 20 MG PO TABS
40.0000 mg | ORAL_TABLET | Freq: Every day | ORAL | Status: DC
  Administered 2021-05-27 – 2021-05-28 (×2): 40 mg via ORAL
  Administered 2021-05-29: 20 mg via ORAL
  Administered 2021-05-30 – 2021-06-05 (×7): 40 mg via ORAL
  Filled 2021-05-27 (×10): qty 2

## 2021-05-27 MED ORDER — SODIUM CHLORIDE 0.9 % IV SOLN
2.0000 g | INTRAVENOUS | Status: AC
  Administered 2021-05-27: 2 g via INTRAVENOUS
  Filled 2021-05-27: qty 2

## 2021-05-27 MED ORDER — BUPIVACAINE LIPOSOME 1.3 % IJ SUSP
INTRAMUSCULAR | Status: AC
  Filled 2021-05-27: qty 20

## 2021-05-27 MED ORDER — GLUCERNA SHAKE PO LIQD
237.0000 mL | Freq: Two times a day (BID) | ORAL | Status: DC
  Administered 2021-05-28 – 2021-06-05 (×13): 237 mL via ORAL
  Filled 2021-05-27 (×18): qty 237

## 2021-05-27 MED ORDER — DIPHENHYDRAMINE HCL 12.5 MG/5ML PO ELIX: 12.5000 mg | ORAL_SOLUTION | Freq: Four times a day (QID) | ORAL | Status: DC | PRN

## 2021-05-27 MED ORDER — PHENYLEPHRINE HCL (PRESSORS) 10 MG/ML IV SOLN
INTRAVENOUS | Status: AC
  Filled 2021-05-27: qty 1

## 2021-05-27 MED ORDER — HYDROMORPHONE HCL 1 MG/ML IJ SOLN
0.2500 mg | INTRAMUSCULAR | Status: DC | PRN
  Administered 2021-05-27 (×2): 0.5 mg via INTRAVENOUS

## 2021-05-27 MED ORDER — METRONIDAZOLE 500 MG PO TABS
1000.0000 mg | ORAL_TABLET | ORAL | Status: DC
Start: 2021-05-27 — End: 2021-05-27

## 2021-05-27 MED ORDER — DIPHENHYDRAMINE HCL 50 MG/ML IJ SOLN: 12.5000 mg | Freq: Four times a day (QID) | INTRAMUSCULAR | Status: DC | PRN

## 2021-05-27 MED ORDER — MIDAZOLAM HCL 2 MG/2ML IJ SOLN
INTRAMUSCULAR | Status: AC
  Filled 2021-05-27: qty 2

## 2021-05-27 MED ORDER — POTASSIUM CHLORIDE CRYS ER 20 MEQ PO TBCR
20.0000 meq | EXTENDED_RELEASE_TABLET | Freq: Every day | ORAL | Status: DC
  Administered 2021-05-27 – 2021-06-05 (×10): 20 meq via ORAL
  Filled 2021-05-27 (×10): qty 1

## 2021-05-27 MED ORDER — SODIUM CHLORIDE 0.9 % IV SOLN
2.0000 g | Freq: Two times a day (BID) | INTRAVENOUS | Status: AC
  Administered 2021-05-27: 2 g via INTRAVENOUS
  Filled 2021-05-27: qty 2

## 2021-05-27 MED ORDER — GABAPENTIN 300 MG PO CAPS
300.0000 mg | ORAL_CAPSULE | ORAL | Status: AC
  Administered 2021-05-27: 300 mg via ORAL
  Filled 2021-05-27: qty 1

## 2021-05-27 MED ORDER — PROCHLORPERAZINE MALEATE 10 MG PO TABS
10.0000 mg | ORAL_TABLET | Freq: Four times a day (QID) | ORAL | Status: DC | PRN
  Filled 2021-05-27: qty 1

## 2021-05-27 MED ORDER — SIMETHICONE 80 MG PO CHEW
40.0000 mg | CHEWABLE_TABLET | Freq: Four times a day (QID) | ORAL | Status: DC | PRN
  Administered 2021-05-29: 40 mg via ORAL
  Filled 2021-05-27: qty 1

## 2021-05-27 MED ORDER — LIDOCAINE HCL 2 % IJ SOLN
INTRAMUSCULAR | Status: AC
  Filled 2021-05-27: qty 20

## 2021-05-27 MED ORDER — MIDAZOLAM HCL 5 MG/5ML IJ SOLN
INTRAMUSCULAR | Status: DC | PRN
  Administered 2021-05-27: 2 mg via INTRAVENOUS

## 2021-05-27 SURGICAL SUPPLY — 130 items
ADAPTER GOLDBERG URETERAL (ADAPTER) IMPLANT
ADPR CATH 15X14FR FL DRN BG (ADAPTER)
APL PRP STRL LF DISP 70% ISPRP (MISCELLANEOUS)
APPLIER CLIP 5 13 M/L LIGAMAX5 (MISCELLANEOUS)
APPLIER CLIP ROT 10 11.4 M/L (STAPLE)
APR CLP MED LRG 11.4X10 (STAPLE)
APR CLP MED LRG 5 ANG JAW (MISCELLANEOUS)
BAG COUNTER SPONGE SURGICOUNT (BAG) ×2 IMPLANT
BAG SPNG CNTER NS LX DISP (BAG) ×1
BAG URO CATCHER STRL LF (MISCELLANEOUS) ×2 IMPLANT
BLADE EXTENDED COATED 6.5IN (ELECTRODE) IMPLANT
CANNULA REDUC XI 12-8 STAPL (CANNULA)
CANNULA REDUCER 12-8 DVNC XI (CANNULA) IMPLANT
CATH URETL OPEN 5X70 (CATHETERS) IMPLANT
CELLS DAT CNTRL 66122 CELL SVR (MISCELLANEOUS) IMPLANT
CHLORAPREP W/TINT 26 (MISCELLANEOUS) IMPLANT
CLIP APPLIE 5 13 M/L LIGAMAX5 (MISCELLANEOUS) IMPLANT
CLIP APPLIE ROT 10 11.4 M/L (STAPLE) IMPLANT
CLOTH BEACON ORANGE TIMEOUT ST (SAFETY) ×2 IMPLANT
COVER SURGICAL LIGHT HANDLE (MISCELLANEOUS) ×4 IMPLANT
COVER TIP SHEARS 8 DVNC (MISCELLANEOUS) ×1 IMPLANT
COVER TIP SHEARS 8MM DA VINCI (MISCELLANEOUS) ×2
DEFOGGER SCOPE WARMER CLEARIFY (MISCELLANEOUS) ×1 IMPLANT
DEVICE TROCAR PUNCTURE CLOSURE (ENDOMECHANICALS) IMPLANT
DRAIN CHANNEL 19F RND (DRAIN) ×1 IMPLANT
DRAPE ARM DVNC X/XI (DISPOSABLE) ×4 IMPLANT
DRAPE COLUMN DVNC XI (DISPOSABLE) ×1 IMPLANT
DRAPE DA VINCI XI ARM (DISPOSABLE) ×8
DRAPE DA VINCI XI COLUMN (DISPOSABLE) ×2
DRAPE SURG IRRIG POUCH 19X23 (DRAPES) ×2 IMPLANT
DRSG OPSITE POSTOP 3X4 (GAUZE/BANDAGES/DRESSINGS) ×1 IMPLANT
DRSG OPSITE POSTOP 4X10 (GAUZE/BANDAGES/DRESSINGS) IMPLANT
DRSG OPSITE POSTOP 4X6 (GAUZE/BANDAGES/DRESSINGS) IMPLANT
DRSG OPSITE POSTOP 4X8 (GAUZE/BANDAGES/DRESSINGS) IMPLANT
DRSG TEGADERM 2-3/8X2-3/4 SM (GAUZE/BANDAGES/DRESSINGS) ×10 IMPLANT
DRSG TEGADERM 4X4.75 (GAUZE/BANDAGES/DRESSINGS) IMPLANT
ELECT PENCIL ROCKER SW 15FT (MISCELLANEOUS) ×2 IMPLANT
ELECT REM PT RETURN 15FT ADLT (MISCELLANEOUS) ×2 IMPLANT
ENDOLOOP SUT PDS II  0 18 (SUTURE)
ENDOLOOP SUT PDS II 0 18 (SUTURE) IMPLANT
EVACUATOR SILICONE 100CC (DRAIN) IMPLANT
GAUZE SPONGE 2X2 8PLY STRL LF (GAUZE/BANDAGES/DRESSINGS) ×1 IMPLANT
GLOVE SURG ENC TEXT LTX SZ7.5 (GLOVE) ×2 IMPLANT
GLOVE SURG NEOPR MICRO LF SZ8 (GLOVE) ×6 IMPLANT
GLOVE SURG UNDER LTX SZ8 (GLOVE) ×6 IMPLANT
GOWN STRL REUS W/ TWL XL LVL3 (GOWN DISPOSABLE) ×4 IMPLANT
GOWN STRL REUS W/TWL XL LVL3 (GOWN DISPOSABLE) ×8
GRASPER SUT TROCAR 14GX15 (MISCELLANEOUS) IMPLANT
GUIDEWIRE ANG ZIPWIRE 038X150 (WIRE) IMPLANT
GUIDEWIRE STR DUAL SENSOR (WIRE) IMPLANT
HOLDER FOLEY CATH W/STRAP (MISCELLANEOUS) ×2 IMPLANT
IRRIG SUCT STRYKERFLOW 2 WTIP (MISCELLANEOUS) ×2
IRRIGATION SUCT STRKRFLW 2 WTP (MISCELLANEOUS) ×1 IMPLANT
KIT PROCEDURE DA VINCI SI (MISCELLANEOUS) ×2
KIT PROCEDURE DVNC SI (MISCELLANEOUS) ×1 IMPLANT
KIT SIGMOIDOSCOPE (SET/KITS/TRAYS/PACK) ×1 IMPLANT
KIT TURNOVER KIT A (KITS) IMPLANT
MANIFOLD NEPTUNE II (INSTRUMENTS) ×2 IMPLANT
NDL INSUFFLATION 14GA 120MM (NEEDLE) ×1 IMPLANT
NEEDLE INSUFFLATION 14GA 120MM (NEEDLE) ×2 IMPLANT
PACK CARDIOVASCULAR III (CUSTOM PROCEDURE TRAY) ×2 IMPLANT
PACK COLON (CUSTOM PROCEDURE TRAY) ×2 IMPLANT
PACK CYSTO (CUSTOM PROCEDURE TRAY) ×2 IMPLANT
PAD POSITIONING PINK XL (MISCELLANEOUS) ×2 IMPLANT
PROTECTOR NERVE ULNAR (MISCELLANEOUS) ×4 IMPLANT
RELOAD STAPLE 45 3.5 BLU DVNC (STAPLE) IMPLANT
RELOAD STAPLE 45 4.3 GRN DVNC (STAPLE) IMPLANT
RELOAD STAPLE 60 3.5 BLU DVNC (STAPLE) IMPLANT
RELOAD STAPLE 60 4.3 GRN DVNC (STAPLE) IMPLANT
RELOAD STAPLER 3.5X45 BLU DVNC (STAPLE) IMPLANT
RELOAD STAPLER 3.5X60 BLU DVNC (STAPLE) IMPLANT
RELOAD STAPLER 4.3X45 GRN DVNC (STAPLE) ×5 IMPLANT
RELOAD STAPLER 4.3X60 GRN DVNC (STAPLE) IMPLANT
RETRACTOR WND ALEXIS 18 MED (MISCELLANEOUS) IMPLANT
RTRCTR WOUND ALEXIS 18CM MED (MISCELLANEOUS)
SCISSORS ENDO CVD 5DCS (MISCELLANEOUS) ×1 IMPLANT
SCISSORS LAP 5X35 DISP (ENDOMECHANICALS) ×2 IMPLANT
SEAL CANN UNIV 5-8 DVNC XI (MISCELLANEOUS) ×3 IMPLANT
SEAL XI 5MM-8MM UNIVERSAL (MISCELLANEOUS) ×6
SEALER VESSEL DA VINCI XI (MISCELLANEOUS) ×2
SEALER VESSEL EXT DVNC XI (MISCELLANEOUS) ×1 IMPLANT
SOLUTION ELECTROLUBE (MISCELLANEOUS) ×2 IMPLANT
SPIKE FLUID TRANSFER (MISCELLANEOUS) ×2 IMPLANT
SPONGE GAUZE 2X2 STER 10/PKG (GAUZE/BANDAGES/DRESSINGS) ×1
STAPLER 45 DA VINCI SURE FORM (STAPLE) ×2
STAPLER 45 SUREFORM DVNC (STAPLE) IMPLANT
STAPLER 60 DA VINCI SURE FORM (STAPLE)
STAPLER 60 SUREFORM DVNC (STAPLE) IMPLANT
STAPLER CANNULA SEAL DVNC XI (STAPLE) ×1 IMPLANT
STAPLER CANNULA SEAL XI (STAPLE) ×2
STAPLER ECHELON POWER CIR 29 (STAPLE) IMPLANT
STAPLER ECHELON POWER CIR 31 (STAPLE) ×1 IMPLANT
STAPLER RELOAD 3.5X45 BLU DVNC (STAPLE)
STAPLER RELOAD 3.5X45 BLUE (STAPLE)
STAPLER RELOAD 3.5X60 BLU DVNC (STAPLE)
STAPLER RELOAD 3.5X60 BLUE (STAPLE)
STAPLER RELOAD 4.3X45 GREEN (STAPLE) ×10
STAPLER RELOAD 4.3X45 GRN DVNC (STAPLE) ×5
STAPLER RELOAD 4.3X60 GREEN (STAPLE)
STAPLER RELOAD 4.3X60 GRN DVNC (STAPLE)
STOPCOCK 4 WAY LG BORE MALE ST (IV SETS) ×4 IMPLANT
SURGILUBE 2OZ TUBE FLIPTOP (MISCELLANEOUS) IMPLANT
SUT MNCRL AB 4-0 PS2 18 (SUTURE) ×2 IMPLANT
SUT PDS AB 1 CT1 27 (SUTURE) ×4 IMPLANT
SUT PROLENE 0 CT 2 (SUTURE) IMPLANT
SUT PROLENE 2 0 KS (SUTURE) IMPLANT
SUT PROLENE 2 0 SH DA (SUTURE) ×1 IMPLANT
SUT SILK 2 0 (SUTURE)
SUT SILK 2 0 SH CR/8 (SUTURE) IMPLANT
SUT SILK 2-0 18XBRD TIE 12 (SUTURE) IMPLANT
SUT SILK 3 0 (SUTURE)
SUT SILK 3 0 SH CR/8 (SUTURE) ×2 IMPLANT
SUT SILK 3-0 18XBRD TIE 12 (SUTURE) IMPLANT
SUT V-LOC BARB 180 2/0GR6 GS22 (SUTURE)
SUT VIC AB 3-0 SH 18 (SUTURE) IMPLANT
SUT VIC AB 3-0 SH 27 (SUTURE)
SUT VIC AB 3-0 SH 27XBRD (SUTURE) IMPLANT
SUT VICRYL 0 UR6 27IN ABS (SUTURE) ×2 IMPLANT
SUTURE V-LC BRB 180 2/0GR6GS22 (SUTURE) IMPLANT
SYR 10ML ECCENTRIC (SYRINGE) ×2 IMPLANT
SYS LAPSCP GELPORT 120MM (MISCELLANEOUS)
SYS WOUND ALEXIS 18CM MED (MISCELLANEOUS) ×2
SYSTEM LAPSCP GELPORT 120MM (MISCELLANEOUS) IMPLANT
SYSTEM WOUND ALEXIS 18CM MED (MISCELLANEOUS) ×1 IMPLANT
TOWEL OR NON WOVEN STRL DISP B (DISPOSABLE) ×2 IMPLANT
TRAY FOLEY MTR SLVR 16FR STAT (SET/KITS/TRAYS/PACK) ×2 IMPLANT
TROCAR ADV FIXATION 5X100MM (TROCAR) ×2 IMPLANT
TUBING CONNECTING 10 (TUBING) ×6 IMPLANT
TUBING INSUFFLATION 10FT LAP (TUBING) ×2 IMPLANT
TUBING UROLOGY SET (TUBING) IMPLANT

## 2021-05-27 NOTE — Transfer of Care (Signed)
Immediate Anesthesia Transfer of Care Note ? ?Patient: William Good ? ?Procedure(s) Performed: ROBOTIC LOW ANTERIOR COLON RESECTION, VERY LOW COLOANAL ANASTOMOSIS, DIVERTING ILEOSTOMY, BILATERAL TAP BLOCK,  FIRFLY INJECTION ?POSSIBLE OSTOMY ?RIGID PROCTOSCOPY ?CYSTOSCOPY with FIREFLY INJECTION ? ?Patient Location: PACU ? ?Anesthesia Type:General ? ?Level of Consciousness: awake, alert  and oriented ? ?Airway & Oxygen Therapy: Patient Spontanous Breathing and Patient connected to face mask oxygen ? ?Post-op Assessment: Report given to RN and Post -op Vital signs reviewed and stable ? ?Post vital signs: Reviewed and stable ? ?Last Vitals:  ?Vitals Value Taken Time  ?BP 146/95 05/27/21 1416  ?Temp    ?Pulse 75 05/27/21 1417  ?Resp 12 05/27/21 1417  ?SpO2 96 % 05/27/21 1417  ?Vitals shown include unvalidated device data. ? ?Last Pain:  ?Vitals:  ? 05/27/21 0659  ?TempSrc:   ?PainSc: 0-No pain  ?   ? ?  ? ?Complications: No notable events documented. ?

## 2021-05-27 NOTE — Consult Note (Signed)
[place] Nurse ostomy consult note ?Patient consult received for preoperative stoma site selection. Patient marked on 05/14/21. ? ?[place] will follow to see if a stoma is created intraoperatively. ? ? ?Thanks, ?Maudie Flakes, MSN, RN, [place], [place], CWON-AP, [place]  ?Pager# [phone]  ?

## 2021-05-27 NOTE — Anesthesia Procedure Notes (Signed)
Procedure Name: Intubation ?Date/Time: 05/27/2021 8:31 AM ?Performed by: Darroll Bredeson D, CRNA ?Pre-anesthesia Checklist: Patient identified, Emergency Drugs available, Suction available and Patient being monitored ?Patient Re-evaluated:Patient Re-evaluated prior to induction ?Oxygen Delivery Method: Circle system utilized ?Preoxygenation: Pre-oxygenation with 100% oxygen ?Induction Type: IV induction ?Ventilation: Mask ventilation without difficulty ?Grade View: Grade I ?Tube type: Oral ?Tube size: 7.5 mm ?Number of attempts: 1 ?Airway Equipment and Method: Stylet ?Placement Confirmation: ETT inserted through vocal cords under direct vision, positive ETCO2 and breath sounds checked- equal and bilateral ?Secured at: 22 cm ?Tube secured with: Tape ?Dental Injury: Teeth and Oropharynx as per pre-operative assessment  ? ? ? ? ?

## 2021-05-27 NOTE — H&P (Signed)
?05/27/2021 ? ? ? ? ?REFERRING PHYSICIAN: Electa Sniff,* ? ?Patient Care Team: ?Oren Beckmann, MD as PCP - General (Family Medicine) ?Ules Marsala, Adrian Saran, MD as Consulting Provider (General Surgery) ?Otis Brace, MD (Gastroenterology) ?Electa Sniff, MD (Medical Oncology) ?Marye Round, MD (Radiation Oncology) ?Miguel Dibble, MD as Consulting Provider (General Surgery) ?Larey Dresser, MD (Cardiovascular Disease) ?Santa Genera, MD (Urology) ? ?PROVIDER: Hollace Kinnier, MD ? ?[facility] MRN: [id number] ?DOB: 1956/02/26 ?DATE OF ENCOUNTER: 03/17/2021 ? ?SUBJECTIVE  ? ?Chief Complaint: Rectal Cancer ? ? ?History of Present Illness: ?William Good is a 66 y.o. male who is seen today  ?as an office consultation at the request of Dr. Benay Spice  ?for evaluation of Rectal Cancer ?.  ? ?Patient returns. Gentleman history with hemorrhoids and rectal bleeding. I did banding many years ago. He came in in the summer and I palpate an obvious mass. Biopsy consistent with cancer. Discussed at GI tumor board. Recommendation made for total neoadjuvant chemotherapy. He was on FOLFOX August-November. Switch to Leeroy Bock XRT treatment starting around Thanksgiving. Finished last dose 03/16/2021. Cancer history as below. ? ?1. Rectal cancer ?? Mass at 6 cm on digital exam/anoscopy 08/25/2020 ?? Colonoscopy 09/05/2020-mid rectal mass-biopsy, adenocarcinoma, no loss of mismatch repair protein expression ?? CT abdomen/pelvis 08/29/2020-small amount of free fluid in the pelvis, mildly enlarged left external iliac node, cystic changes of the left kidney with hydronephrosis and regions of cortical thinning with transition at the left UPJ suggesting chronic left UPJ obstruction ?? MRI pelvis 09/24/2020-tumor 6 cm from the anal verge, 1.8 cm from the internal anal sphincter, T2, 7 mm lymph node in the left mesorectal sheath, N1 ?? Cycle 1 FOLFOX 10/08/2020 ?? Cycle 2 FOLFOX 10/22/2020 ?? Cycle  3 FOLFOX 11/12/2020 (oxaliplatin dose reduced, white cell growth factor support on day of pump discontinuation) ?? Cycle 4 FOLFOX 11/26/2020, 5-FU bolus and infusion dose reduced secondary to diarrhea, Ziextenzo ?? Cycle 5 FOLFOX 12/10/2020, 5-FU bolus held due to diarrhea, Ziextenzo ?? Cycle 6 FOLFOX 12/24/2020, 5-FU bolus held, Ziextenzo ?? Cycle 7 FOLFOX 01/07/2021, oxaliplatin held due to neuropathy, 5-FU bolus held, Ziextenzo held ?? Cycle 8 FOLFOX 01/21/2021, oxaliplatin held due to neuropathy, 5-FU bolus held, Ziextenzo held ?? Radiation/Xeloda 02/02/2021 ? ?Patient comes today with his brother. He is moving his bowels about 3 or 4 times a day occasionally loose. However no more rectal bleeding. No incontinence. He still smokes but is cut back somewhat. He can walk about 10 minutes before he has to stop feeling short of breath. No exertional chest pain. No problems with urination or defecation. Past history as noted in prior note that I copied from below ? ?Cleared by Cardiology - ready for surgery ? ? ? ?Medical History: ? ?Past Medical History:  ?Diagnosis Date  ? Anxiety  ? GERD (gastroesophageal reflux disease)  ? History of cancer  ? Hypertension  ? ?Patient Active Problem List  ?Diagnosis  ? Unspecified essential hypertension  ? Shortness of breath  ? Nonspecific abnormal electrocardiogram (ECG) (EKG)  ? Chest pain  ? Rectal cancer (CMS-HCC)  ? ?History reviewed. No pertinent surgical history.  ? ?No Known Allergies ? ?Current Outpatient Medications on File Prior to Visit  ?Medication Sig Dispense Refill  ? acetaminophen (TYLENOL) 325 MG tablet Take by mouth  ? albuterol 90 mcg/actuation inhaler Inhale into the lungs  ? ALPRAZolam (XANAX) 1 MG tablet Take 1 mg by mouth 2 (two) times daily  ? aspirin 81  MG EC tablet Take by mouth  ? azithromycin (ZITHROMAX) 250 MG tablet TAKE 2 TABLETS BY MOUTH ON DAY 1, THEN TAKE 1 TABLET DAILY ON DAYS 2-5  ? benztropine (COGENTIN) 0.5 MG tablet Take 0.5 mg by mouth at  bedtime  ? capecitabine (XELODA) 500 MG tablet Take 4 tablets ($RemoveBe'2000mg'ubNHngakG$ ) by mouth in AM and 3 tablets ($RemoveBe'1500mg'xZktynqEz$ ) in PM. Take with food. Take Monday-Friday. Take only on days of radiation.  ? cholecalciferol (VITAMIN D3) 2,000 unit capsule Take by mouth  ? HYDROcodone-acetaminophen (NORCO) 5-325 mg tablet Take by mouth  ? lidocaine-prilocaine (EMLA) cream Apply topically  ? loperamide (IMODIUM) 2 mg capsule Take by mouth  ? mirtazapine (REMERON) 45 MG tablet Take by mouth  ? omeprazole (PRILOSEC) 20 MG DR capsule Take 20 mg by mouth 2 (two) times daily  ? ondansetron (ZOFRAN) 8 MG tablet Take 8 mg by mouth every 8 (eight) hours as needed  ? potassium chloride (KLOR-CON) 20 MEQ ER tablet Take 1 tablet by mouth once daily  ? pravastatin (PRAVACHOL) 40 MG tablet Take 40 mg by mouth once daily  ? predniSONE (DELTASONE) 10 MG tablet Take by mouth  ? prochlorperazine (COMPAZINE) 10 MG tablet Take 10 mg by mouth every 6 (six) hours as needed  ? propranoloL (INDERAL LA) 160 mg LA capsule Take 160 mg by mouth once daily  ? risperiDONE (RISPERDAL) 0.25 MG tablet Take by mouth  ? SUMAtriptan (IMITREX) 100 MG tablet Take 1/2 to 1 tablet at onset of migraine up to twice a day as needed  ? ?No current facility-administered medications on file prior to visit.  ? ?Family History  ?Problem Relation Age of Onset  ? Coronary Artery Disease (Blocked arteries around heart) Father  ? ? ?Social History  ? ?Tobacco Use  ?Smoking Status Every Day  ? Types: Cigarettes  ?Smokeless Tobacco Not on file  ? ? ?Social History  ? ?Socioeconomic History  ? Marital status: Single  ?Tobacco Use  ? Smoking status: Every Day  ?Types: Cigarettes  ?Substance and Sexual Activity  ? Alcohol use: Not Currently  ? Drug use: Not Currently  ? ?############################################################ ? ?Review of Systems: ?A complete review of systems (ROS) was obtained from the patient. I have reviewed this information and discussed as appropriate with the  patient. See HPI as well for other pertinent ROS. ? ?Constitutional: No fevers, chills, sweats. Weight stable ?Eyes: No vision changes, No discharge ?HENT: No sore throats, nasal drainage ?Lymph: No neck swelling, No bruising easily ?Pulmonary: No cough, productive sputum ?CV: No orthopnea, PND Patient walks 10 minutes. No exertional chest/neck/shoulder/arm pain. ? ?GI: No personal nor family history of inflammatory bowel disease, irritable bowel syndrome, allergy such as Celiac Sprue, dietary/dairy problems, colitis, ulcers nor gastritis. No recent sick contacts/gastroenteritis. No travel outside the country. No changes in diet. ? ?Renal: No UTIs, No hematuria ?Genital: No drainage, bleeding, masses ?Musculoskeletal: No severe joint pain. Good ROM major joints ?Skin: No sores or lesions ?Heme/Lymph: No easy bleeding. No swollen lymph nodes ? ?OBJECTIVE  ? ?Vitals:  ?03/17/21 1115  ?BP: 110/70  ?Pulse: 73  ?Weight: (!) 101.9 kg (224 lb 9.6 oz)  ?Height: 182.9 cm (6')  ?Body mass index is 30.46 kg/m?. ? ?BP (!) 143/93   Pulse 73   Temp 98.1 ?F (36.7 ?C) (Oral)   Resp 18   Wt 108.9 kg   SpO2 99%   BMI 32.55 kg/m?  ?05/27/2021 ? ? ?PHYSICAL EXAM: ? ?Constitutional: Not cachectic. Hygeine adequate. Vitals  signs as above.  ?Eyes: Pupils reactive, normal extraocular movements. Sclera nonicteric ?Neuro: CN II-XII intact. No major focal sensory defects. No major motor deficits. ?Lymph: No head/neck/groin lymphadenopathy ?Psych: No severe agitation. No severe anxiety. Judgment & insight Adequate, Oriented x4, ?HENT: Normocephalic, Mucus membranes moist. No thrush.  ?Neck: Supple, No tracheal deviation. No obvious thyromegaly ?Chest: No pain to chest wall compression. Good respiratory excursion. No audible wheezing ?CV: Pulses intact. Regular rhythm. No major extremity edema ?Ext: No obvious deformity or contracture. Edema: Not present. No cyanosis ?Skin: No major subcutaneous nodules. Warm and dry ?Musculoskeletal:  Severe joint rigidity not present. No obvious clubbing. No digital petechiae. Mobility: no assist device moving easily without restrictions ? ?Abdomen:  ?Obese  ?Soft. Nondistended. Nontender. Hernia: Not present. Diast

## 2021-05-27 NOTE — Op Note (Signed)
05/27/2021 ? ?2:18 PM ? ?PATIENT:  William Good  66 y.o. male ? ?Patient Care Team: ?Aura Dials, MD as PCP - General (Family Medicine) ?Michael Boston, MD as Consulting Physician (General Surgery) ?Otis Brace, MD as Consulting Physician (Gastroenterology) ?Ladell Pier, MD as Consulting Physician (Oncology) ?Kyung Rudd, MD as Consulting Physician (Radiation Oncology) ?Earl Gala, Deliah Goody, RN as Equities trader (Oncology) ?Larey Dresser, MD as Consulting Physician (Cardiology) ?Lucas Mallow, MD as Consulting Physician (Urology) ? ?PRE-OPERATIVE DIAGNOSIS:  DISTAL RECTAL CANCER ? ?POST-OPERATIVE DIAGNOSIS:  DISTAL RECTAL CANCER ? ?PROCEDURE:   ?-ROBOTIC LOW ANTERIOR RECTOSIGMOID RESECTION ?-VERY LOW COLOANAL ANASTOMOSIS ?-DIVERTING LOOP ILEOSTOMY ?-INTRAOPERATIVE ASSESSMENT OF TISSUE VASCULAR PERFUSION USING ICG (indocyanine green) IMMUNOFLUORESCENCE ?-TRANSVERSUS ABDOMINIS PLANE (TAP) BLOCK - BILATERAL ?-RIGID PROCTOSCOPY ? ?SURGEON:  Adin Hector, MD ? ?ASSISTANT: Carlena Hurl, PA-C ?An experienced assistant was required given the standard of surgical care given the complexity of the case.  This assistant was needed for exposure, dissection, suction, tissue approximation, retraction, perception, etc. ? ?ANESTHESIA:    ? ?General ? ?Regional TRANSVERSUS ABDOMINIS PLANE (TAP) nerve block for perioperative & postoperative pain control provided with liposomal bupivacaine (Experel) mixed with 0.25% bupivacaine as a Bilateral TAP block x 78m each side at the level of the transverse abdominis & preperitoneal spaces along the flank at the anterior axillary line, from subcostal ridge to iliac crest under laparoscopic guidance  ? ?Local field block at port sites & extraction wound ? ?EBL:  Total I/O ?In: 2000 [I.V.:2000] ?Out: 450 [Urine:350; Blood:100] ? ?Delay start of Pharmacological VTE agent (>24hrs) due to surgical blood loss or risk of bleeding:  no ? ?DRAINS: 19 Fr Blake drain goes to the  pelvis ? ?SPECIMEN:   ?RECTOSIGMOID COLON (open end proximal) ?DISTAL ANASTOMOTIC RING (final distal margin) ? ?DISPOSITION OF SPECIMEN:  PATHOLOGY ? ?COUNTS:  YES ? ?PLAN OF CARE: Admit to inpatient  ? ?PATIENT DISPOSITION:  PACU - hemodynamically stable. ? ?INDICATION:   ? ?Patient found to have bulky mid to distal rectal cancer.  Felt to benefit from intervention.  Underwent total IV chemotherapy in the neoadjuvant chemoradiation therapy.  He is 10 weeks from chemoradiation therapy.  I recommended segmental resection: ? ?The anatomy & physiology of the digestive tract was discussed.  The pathophysiology of the rectal pathology was discussed.  Natural history risks without surgery was discussed.   I worked to give an overview of the disease and the frequent need to have multispecialty involvement.  I feel the risks of no intervention will lead to serious problems that outweigh the operative risks; therefore, I recommended a partial proctocolectomy to remove the pathology.  Minimally Invasive (Robotic/Laparoscopic) & open techniques were discussed.  We will work to preserve anal & pelvic floor function without sacrificing cure. ? ?Risks such as bleeding, infection, abscess, leak, reoperation, possible temporary or permanent ostomy, hernia, stroke, heart attack, death, and other risks were discussed.  I noted a good likelihood this will help address the problem.   Goals of post-operative recovery were discussed as well.  We will work to minimize complications.  Educational information was available as well.  Questions were answered.  The patient expresses understanding & wishes to proceed with surgery.  ? ?OR FINDINGS:  ? ?Patient had fibrous scarring in the distal rectum 4-7 cm from the anal verge.  Some thin stricturing.  Consistent with pretty good pathological response compared to the very bulky tumor at first.  Very fibrotic and narrow pelvis with bulky  mesorectum. ? ?No obvious metastatic disease on visceral  parietal peritoneum or liver. ? ?The anastomosis rests 2 cm from the anal verge by rigid proctoscopy.  There is a 31 EEA mid descending colon to anal rectal margin stump ? ?CASE DATA: ? ?Type of patient?: Elective [facility] Private Case ? ?Status of Case? Elective Scheduled ? ?Infection Present At Time Of Surgery (PATOS)?  NO ? ?DESCRIPTION:  ? ?Informed consent was confirmed.  The patient underwent general anaesthesia without difficulty.  The patient was positioned appropriately.  VTE prevention in place.  The patient was clipped, prepped, & draped in a sterile fashion.  Surgical timeout confirmed our plan. ? ?The patient was positioned in reverse Trendelenburg.  Abdominal entry was gained using Varess technique at the left subcostal ridge on the anterior abdominal wall.  No elevated EtCO2 noted.  Port placed.  Camera inspection revealed no injury.  Extra ports were carefully placed under direct laparoscopic visualization.  I reflected the greater omentum and the upper abdomen the small bowel in the upper abdomen.  The patient was carefully positioned.  The Intuitive daVinci robot was docked with camera & instruments carefully placed. ? ?The patient had bulky thickened rectosigmoid colon consistent with radiation effect.  I mobilized the rectosigmoid colon & elevated it to put the main pedicle on tension.  I scored the base of peritoneum of the medial side of the mesentery of the elevated left colon from the ligament of Treitz to the mid rectum.   I elevated the sigmoid mesentery and entered into the retro-mesenteric plane. We were able to identify the left ureter and gonadal vessels. We kept those posterior within the retroperitoneum and elevated the left colon mesentery off that. I did isolate the inferior mesenteric artery (IMA) pedicle but did not ligate it yet.  I continued distally and got into the avascular plane posterior to the mesorectum, sparing the nervi ergentes.. This allowed me to help mobilize the rectum  as well by freeing the mesorectum off the sacrum.  I stayed away from the right and left ureters.  I kept the lateral vascular pedicles to the rectum intact. ? ? I skeletonized the lymph nodes off the inferior mesenteric artery pedicle.  I went down to its takeoff from the aorta.  Did tube earnings.  Had delayed bleeding at one corner.  Oversewed it with 2-0 Prolene figure-of-eight suture to good result.  I isolated the inferior mesenteric vein off of the ligament of Treitz just cephalad to that as well.  After confirming the left ureter was out of the way, I went ahead and ligated the inferior mesenteric artery pedicle just near its takeoff from the aorta.  I did ligate the inferior mesenteric vein in a similar fashion.  We ensured hemostasis.  I continued medial to lateral dissection to free the left colon mesentery off the retroperitoneum going up towards the splenic flexure to allow good mobility and protect the colon mesentery.  I mobilized the left colon in a lateral to medial fashion off the retroperitoneum and sidewall attachments along the line of Toldt up towards the splenic flexure to ensure good mobilization of the remaining left colon to reach into the pelvis.  ? ?We then focused on mesorectal dissection.  Freed the mesorectum off the presacral plane until I was distal to the concerning region.  Had to come down to the pelvic floor.  He had a very bulky mesorectum that was fibrotic and edematous and a narrow male pelvis.  Took some time  to have safe windows to do this carefully came through the anterior peritoneal reflection which was rather low.  Freed the rectum off the seminal vesicles and prostate.  Did digital exam to confirm that I was actually distal to the most distal scarring of the rectal cancer.  Very close to the pelvic floor.  Freed off peritoneum on the lateral sidewalls as well and transected the mesentery of the lateral pedicles to get distal to the area of concern.  Came around  anteriorly such that I had good circumferential mesorectal excision and a good margin distal to the area of concern.   ? ?If not mobilize the colon rather well it would not come down very easily to the very low

## 2021-05-27 NOTE — Anesthesia Postprocedure Evaluation (Signed)
Anesthesia Post Note ? ?Patient: William Good ? ?Procedure(s) Performed: ROBOTIC LOW ANTERIOR COLON RESECTION, VERY LOW COLOANAL ANASTOMOSIS, DIVERTING ILEOSTOMY, BILATERAL TAP BLOCK,  FIRFLY INJECTION ?POSSIBLE OSTOMY ?RIGID PROCTOSCOPY ?CYSTOSCOPY with FIREFLY INJECTION ? ?  ? ?Patient location during evaluation: PACU ?Anesthesia Type: General ?Level of consciousness: awake ?Pain management: pain level controlled ?Vital Signs Assessment: post-procedure vital signs reviewed and stable ?Respiratory status: spontaneous breathing, nonlabored ventilation, respiratory function stable and patient connected to nasal cannula oxygen ?Cardiovascular status: blood pressure returned to baseline and stable ?Postop Assessment: no apparent nausea or vomiting ?Anesthetic complications: no ? ? ?No notable events documented. ? ?Last Vitals:  ?Vitals:  ? 05/27/21 1644 05/27/21 1738  ?BP: (!) 146/95 (!) 147/90  ?Pulse: 84 85  ?Resp: 14 14  ?Temp:  36.7 ?C  ?SpO2: 96% 96%  ?  ?Last Pain:  ?Vitals:  ? 05/27/21 1745  ?TempSrc:   ?PainSc: 7   ? ? ?  ?  ?  ?  ?  ?  ? ?Brendaliz Kuk P Ronan Duecker ? ? ? ? ?

## 2021-05-27 NOTE — Discharge Instructions (Signed)
SURGERY: POST OP INSTRUCTIONS ?(Surgery for small bowel obstruction, colon resection, etc) ? ? ?###################################################################### ? ?EAT ?Gradually transition to a high fiber diet with a fiber supplement over the next few days after discharge ? ?WALK ?Walk an hour a day.  Control your pain to do that.   ? ?CONTROL PAIN ?Control pain so that you can walk, sleep, tolerate sneezing/coughing, go up/down stairs. ? ?HAVE A BOWEL MOVEMENT DAILY ?Keep your bowels regular to avoid problems.  OK to try a laxative to override constipation.  OK to use an antidairrheal to slow down diarrhea.  Call if not better after 2 tries ? ?CALL IF YOU HAVE PROBLEMS/CONCERNS ?Call if you are still struggling despite following these instructions. ?Call if you have concerns not answered by these instructions ? ?###################################################################### ? ? ?DIET ?Follow a light diet the first few days at home.  Start with a bland diet such as soups, liquids, starchy foods, low fat foods, etc.  If you feel full, bloated, or constipated, stay on a ful liquid or pureed/blenderized diet for a few days until you feel better and no longer constipated. ?Be sure to drink plenty of fluids every day to avoid getting dehydrated (feeling dizzy, not urinating, etc.). ?Gradually add a fiber supplement to your diet over the next week.  Gradually get back to a regular solid diet.  Avoid fast food or heavy meals the first week as you are more likely to get nauseated. ?It is expected for your digestive tract to need a few months to get back to normal.  It is common for your bowel movements and stools to be irregular.  You will have occasional bloating and cramping that should eventually fade away.  Until you are eating solid food normally, off all pain medications, and back to regular activities; your bowels will not be normal. ?Focus on eating a low-fat, high fiber diet the rest of your life  (See Getting to [place], below). ? ?CARE of your INCISION or WOUND ?It is good for closed incision and even open wounds to be washed every day.  Shower every day.  Short baths are fine.  Wash the incisions and wounds clean with soap & water.    ? ?If you have a closed incision(s), wash the incision with soap & water every day.  You may leave closed incisions open to air if it is dry.   You may cover the incision with clean gauze & replace it after your daily shower for comfort. ? ?It is good for closed incisions and even open wounds to be washed every day.  Shower every day.  Short baths are fine.  Wash the incisions and wounds clean with soap & water.    ?You may leave closed incisions open to air if it is dry.   You may cover the incision with clean gauze & replace it after your daily shower for comfort. ? ?TEGADERM:  You have clear gauze band-aid dressings over your closed incision(s).  Remove the dressings 3 days after surgery. ? ? ?If you have an open wound with a wound vac, see wound vac care instructions. ? ? ? ? ?ACTIVITIES as tolerated ?Start light daily activities --- self-care, walking, climbing stairs-- beginning the day after surgery.  Gradually increase activities as tolerated.  Control your pain to be active.  Stop when you are tired.  Ideally, walk several times a day, eventually an hour a day.   ?Most people are back to most day-to-day activities in  a few weeks.  It takes 4-8 weeks to get back to unrestricted, intense activity. ?If you can walk 30 minutes without difficulty, it is safe to try more intense activity such as jogging, treadmill, bicycling, low-impact aerobics, swimming, etc. ?Save the most intensive and strenuous activity for last (Usually 4-8 weeks after surgery) such as sit-ups, heavy lifting, contact sports, etc.  Refrain from any intense heavy lifting or straining until you are off narcotics for pain control.  You will have off days, but things should improve  week-by-week. ?DO NOT PUSH THROUGH PAIN.  Let pain be your guide: If it hurts to do something, don't do it.  Pain is your body warning you to avoid that activity for another week until the pain goes down. ?You may drive when you are no longer taking narcotic prescription pain medication, you can comfortably wear a seatbelt, and you can safely make sudden turns/stops to protect yourself without hesitating due to pain. ?You may have sexual intercourse when it is comfortable. If it hurts to do something, stop. ? ?MEDICATIONS ?Take your usually prescribed home medications unless otherwise directed.   ?Blood thinners:  ?Usually you can restart any strong blood thinners after the second postoperative day.  It is OK to take aspirin right away.    ? If you are on strong blood thinners (warfarin/Coumadin, Plavix, Xerelto, Eliquis, Pradaxa, etc), discuss with your surgeon, medicine PCP, and/or cardiologist for instructions on when to restart the blood thinner & if blood monitoring is needed (PT/INR blood check, etc).   ? ? ?PAIN CONTROL ?Pain after surgery or related to activity is often due to strain/injury to muscle, tendon, nerves and/or incisions.  This pain is usually short-term and will improve in a few months.  ?To help speed the process of healing and to get back to regular activity more quickly, DO THE FOLLOWING THINGS TOGETHER: ?Increase activity gradually.  DO NOT PUSH THROUGH PAIN ?Use Ice and/or Heat ?Try Gentle Massage and/or Stretching ?Take over the counter pain medication ?Take Narcotic prescription pain medication for more severe pain ? ?Good pain control = faster recovery.  It is better to take more medicine to be more active than to stay in bed all day to avoid medications. ? Increase activity gradually ?Avoid heavy lifting at first, then increase to lifting as tolerated over the next 6 weeks. ?Do not ?push through? the pain.  Listen to your body and avoid positions and maneuvers than reproduce the pain.   Wait a few days before trying something more intense ?Walking an hour a day is encouraged to help your body recover faster and more safely.  Start slowly and stop when getting sore.  If you can walk 30 minutes without stopping or pain, you can try more intense activity (running, jogging, aerobics, cycling, swimming, treadmill, sex, sports, weightlifting, etc.) ?Remember: If it hurts to do it, then don?t do it! ?Use Ice and/or Heat ?You will have swelling and bruising around the incisions.  This will take several weeks to resolve. ?Ice packs or heating pads (6-8 times a day, 30-60 minutes at a time) will help sooth soreness & bruising. ?Some people prefer to use ice alone, heat alone, or alternate between ice & heat.  Experiment and see what works best for you.  Consider trying ice for the first few days to help decrease swelling and bruising; then, switch to heat to help relax sore spots and speed recovery. ?Shower every day.  Short baths are fine.  It feels good!  Keep the incisions and wounds clean with soap & water.   ?Try Gentle Massage and/or Stretching ?Massage at the area of pain many times a day ?Stop if you feel pain - do not overdo it ?Take over the counter pain medication ?This helps the muscle and nerve tissues become less irritable and calm down faster ?Choose ONE of the following over-the-counter anti-inflammatory medications: ?Acetaminophen '500mg'$  tabs (Tylenol) 1-2 pills with every meal and just before bedtime (avoid if you have liver problems or if you have acetaminophen in you narcotic prescription) ?Naproxen '220mg'$  tabs (ex. Aleve, Naprosyn) 1-2 pills twice a day (avoid if you have kidney, stomach, IBD, or bleeding problems) ?Ibuprofen '200mg'$  tabs (ex. Advil, Motrin) 3-4 pills with every meal and just before bedtime (avoid if you have kidney, stomach, IBD, or bleeding problems) ?Take with food/snack several times a day as directed for at least 2 weeks to help keep pain / soreness down & more  manageable. ?Take Narcotic prescription pain medication for more severe pain ?A prescription for strong pain control is often given to you upon discharge (for example: oxycodone/Percocet, hydrocodone/Norco/Vicodin, or tramad

## 2021-05-27 NOTE — Op Note (Signed)
Preoperative diagnosis:  ?1. Ureteral identification for pelvic surgery ? ?Postoperative diagnosis: ?1. same ? ?Procedure(s): ?1. Cystoscopy with bilateral ureteral catheterization for firefly injection ? ?Surgeon: Dr. Donald Pore ? ?Anesthesia: general ? ?Complications: none ? ?EBL: <1 cc ? ?Intraoperative findings: s/p bilateral firefly injection. Cysto unremarkable.  ? ?Indication: identification of ureters for abdominal/pelvic surgery ? ?Description of procedure: ? ? ?With appropriate consent having been obtained, the patient was brought to the operative suite. Anesthesia was induced and the patient was prepped and draped in the usual sterile fashion. A timeout was performed ? ?Thereafter the rigid cystoscope was carefully inserted into the bladder. The bladder was inspected thoroughly and no abnormalities were identified. The patient had a short prostate without evidence of obstruction. ? ?The right ureteral orifice was identified and cannulated with a 6 fr open ended ureteral catheter. It was easily advanced to the level of the collecting system. The ureteral catheter was then slowly withdrawn while simultaneously injecting the firefly solution over the length of the ureter. Approximately 8 cc total were injected. ? ?The procedure was then repeated in an identical fashion on the left.  ? ?A 16 fr foley was then placed in a sterile fashion with immediate return of clear blue/green urine, and the balloon was inflated with 10 cc sterile water.  ? ?On imaging from last June, it appears William Good has long standing obstruction at the left UPJ. His kidney function is good based on recent creatinines, and he is asymptomatic. Additionally, he recently completed a course of chemo. Once recovered from his operation today it could be prudent to f/u with urology to further evaluate.  ? ?Donald Pore MD ?05/27/2021, 9:03 AM  ?[facility]  ?Pager: [phone] ? ? ? ? ? ? ? ?

## 2021-05-28 ENCOUNTER — Encounter (HOSPITAL_COMMUNITY): Payer: Self-pay | Admitting: Surgery

## 2021-05-28 DIAGNOSIS — I2510 Atherosclerotic heart disease of native coronary artery without angina pectoris: Secondary | ICD-10-CM | POA: Diagnosis present

## 2021-05-28 DIAGNOSIS — N179 Acute kidney failure, unspecified: Secondary | ICD-10-CM | POA: Diagnosis present

## 2021-05-28 DIAGNOSIS — C20 Malignant neoplasm of rectum: Secondary | ICD-10-CM | POA: Diagnosis not present

## 2021-05-28 LAB — BASIC METABOLIC PANEL
Anion gap: 7 (ref 5–15)
BUN: 28 mg/dL — ABNORMAL HIGH (ref 8–23)
CO2: 23 mmol/L (ref 22–32)
Calcium: 7.8 mg/dL — ABNORMAL LOW (ref 8.9–10.3)
Chloride: 104 mmol/L (ref 98–111)
Creatinine, Ser: 1.74 mg/dL — ABNORMAL HIGH (ref 0.61–1.24)
GFR, Estimated: 43 mL/min — ABNORMAL LOW (ref >60)
Glucose, Bld: 193 mg/dL — ABNORMAL HIGH (ref 70–99)
Potassium: 4.4 mmol/L (ref 3.5–5.1)
Sodium: 134 mmol/L — ABNORMAL LOW (ref 135–145)

## 2021-05-28 LAB — HEPATIC FUNCTION PANEL
ALT: 39 U/L (ref 0–44)
AST: 43 U/L — ABNORMAL HIGH (ref 15–41)
Albumin: 3.1 g/dL — ABNORMAL LOW (ref 3.5–5.0)
Alkaline Phosphatase: 69 U/L (ref 38–126)
Bilirubin, Direct: 0.1 mg/dL (ref 0.0–0.2)
Indirect Bilirubin: 0.3 mg/dL (ref 0.3–0.9)
Total Bilirubin: 0.4 mg/dL (ref 0.3–1.2)
Total Protein: 5.8 g/dL — ABNORMAL LOW (ref 6.5–8.1)

## 2021-05-28 LAB — CBC
HCT: 38.8 % — ABNORMAL LOW (ref 39.0–52.0)
Hemoglobin: 13.5 g/dL (ref 13.0–17.0)
MCH: 33.9 pg (ref 26.0–34.0)
MCHC: 34.8 g/dL (ref 30.0–36.0)
MCV: 97.5 fL (ref 80.0–100.0)
Platelets: 124 10*3/uL — ABNORMAL LOW (ref 150–400)
RBC: 3.98 MIL/uL — ABNORMAL LOW (ref 4.22–5.81)
RDW: 13.7 % (ref 11.5–15.5)
WBC: 9.7 10*3/uL (ref 4.0–10.5)
nRBC: 0 % (ref 0.0–0.2)

## 2021-05-28 LAB — MAGNESIUM: Magnesium: 1.5 mg/dL — ABNORMAL LOW (ref 1.7–2.4)

## 2021-05-28 LAB — GLUCOSE, CAPILLARY
Glucose-Capillary: 152 mg/dL — ABNORMAL HIGH (ref 70–99)
Glucose-Capillary: 185 mg/dL — ABNORMAL HIGH (ref 70–99)
Glucose-Capillary: 179 mg/dL — ABNORMAL HIGH (ref 70–99)
Glucose-Capillary: 202 mg/dL — ABNORMAL HIGH (ref 70–99)
Glucose-Capillary: 195 mg/dL — ABNORMAL HIGH (ref 70–99)

## 2021-05-28 MED ORDER — LACTATED RINGERS IV SOLN: INTRAVENOUS | Status: DC

## 2021-05-28 MED ORDER — MAGNESIUM SULFATE 4 GM/100ML IV SOLN
4.0000 g | Freq: Once | INTRAVENOUS | Status: AC
  Administered 2021-05-28: 4 g via INTRAVENOUS
  Filled 2021-05-28: qty 100

## 2021-05-28 MED ORDER — SODIUM CHLORIDE 0.9% FLUSH
10.0000 mL | Freq: Two times a day (BID) | INTRAVENOUS | Status: DC
  Administered 2021-05-28 – 2021-06-05 (×9): 10 mL

## 2021-05-28 MED ORDER — LACTATED RINGERS IV BOLUS
1000.0000 mL | Freq: Once | INTRAVENOUS | Status: AC
Start: 2021-05-28 — End: 2021-05-28
  Administered 2021-05-28: 1000 mL via INTRAVENOUS

## 2021-05-28 MED ORDER — SODIUM CHLORIDE 0.9% FLUSH
10.0000 mL | INTRAVENOUS | Status: DC | PRN
  Administered 2021-06-03 – 2021-06-05 (×2): 10 mL

## 2021-05-28 NOTE — Progress Notes (Addendum)
? ?William Good ?[id number] ?[birth year] ? ?CARE TEAM: ? ?PCP: Aura Dials, MD ? ?Outpatient Care Team: Patient Care Team: ?Aura Dials, MD as PCP - General (Family Medicine) ?Michael Boston, MD as Consulting Physician (General Surgery) ?Otis Brace, MD as Consulting Physician (Gastroenterology) ?Ladell Pier, MD as Consulting Physician (Oncology) ?Kyung Rudd, MD as Consulting Physician (Radiation Oncology) ?Earl Gala, Deliah Goody, RN as Equities trader (Oncology) ?Larey Dresser, MD as Consulting Physician (Cardiology) ?Lucas Mallow, MD as Consulting Physician (Urology) ? ?Inpatient Treatment Team: Treatment Team: Attending Provider: Michael Boston, MD; [place] Nurse: Illene Regulus, RN; Technician: Samara Snide, NT; Technician: Leda Quail, NT; Consulting Physician: Ladell Pier, MD; Occupational Therapist: Lenward Chancellor, OT; Physical Therapist: Mathis Fare, PT; Utilization Review: Fortino Sic, RN; Pharmacist: Lenis Noon, [facility]; Registered Nurse: Tanda Rockers, RN ? ? ?Problem List:  ? ?Principal Problem: ?  Rectal cancer ([place]) ?Active Problems: ?  Essential hypertension ?  Migraine ?  Chronic kidney disease, stage 3 unspecified ([place]) ?  Type 2 diabetes mellitus with complication, without long-term current use of insulin ([place]) ?  Bipolar disorder ([place]) ?  Polycystic kidney ?  Rectal adenocarcinoma ([place]) ? ? ?1 Day Post-Op  05/27/2021 ? ?POST-OPERATIVE DIAGNOSIS:  DISTAL RECTAL CANCER ?  ?PROCEDURE:   ?-ROBOTIC LOW ANTERIOR RECTOSIGMOID RESECTION ?-VERY LOW COLOANAL ANASTOMOSIS ?-DIVERTING LOOP ILEOSTOMY ?-INTRAOPERATIVE ASSESSMENT OF TISSUE VASCULAR PERFUSION USING ICG (indocyanine green) IMMUNOFLUORESCENCE ?-TRANSVERSUS ABDOMINIS PLANE (TAP) BLOCK - BILATERAL ?-RIGID PROCTOSCOPY ?  ?SURGEON:  Adin Hector, MD ? ?OR FINDINGS:  ?  ?Patient had fibrous scarring in the distal rectum 4-7 cm from the anal verge.  Some thin stricturing.  Consistent  with pretty good pathological response compared to the very bulky tumor at first.  Very fibrotic and narrow pelvis with bulky mesorectum. ?  ?No obvious metastatic disease on visceral parietal peritoneum or liver. ?  ?The anastomosis rests 2 cm from the anal verge by rigid proctoscopy.  There is a 31 EEA mid descending colon to anal rectal margin stump ? ?###################################### ? ?Preoperative diagnosis:  ?1. Ureteral identification for pelvic surgery ?  ?Postoperative diagnosis: ?1. same ?  ?Procedure(s): ?1. Cystoscopy with bilateral ureteral catheterization for firefly injection ?  ?Surgeon: Dr. Donald Pore ?  ? ? ? ?Assessment ? ?Recovering ? ?(Hospital Stay = 1 days) ? ?Plan: ? ?ERAS enhance recovery pathway ? ?Diabetic control.  Sliding scale insulin started .  We will ask medicine to have input to help start regimen since he is not on anything.  Called [facility] - Dr Olevia Bowens most likely the one to help see & offer recs  ? ?Follow-up on pathology. ? ?Patient has small anastomotic leak controlled with drain and diversion.  Should seal up over time.  Follow.  Keep drain for several weeks. ? ?Wean off IV fluids with as needed backup. ? ?Foley out per protocol. ? ?Ostomy care and training. ? ?Have involvement with physical, occupational therapies.  Also case management since patient apparently lives at home but is borderline functioning/independent and brother most likely does not feel comfortable managing patient by himself.  Suspect may need SNF.  Perhaps just home health.  We will see. ? ?-VTE prophylaxis- SCDs, etc ? ?-mobilize as tolerated to help recovery ? ?Disposition:  ?Disposition:  ?The patient is from: Home ? ?Anticipate discharge to:  [facility] (SNF) ? ?Anticipated Date of Discharge is:  March 27,2023 ?  ? ?Barriers to discharge:  Pending Clinical  improvement (more likely than not) ? ?Patient currently is NOT MEDICALLY STABLE for discharge from the hospital from a surgery  standpoint. ? ? ? ? ? ?I reviewed nursing notes, last 24 h vitals and pain scores, last 48 h intake and output, last 24 h labs and trends, and last 24 h imaging results. I have reviewed this patient's available data, including medical history, events of note, test results, etc as part of my evaluation.  A significant portion of that time was spent in counseling.  Care during the described time interval was provided by me. ? ?This care required moderate level of medical decision making.  05/28/2021 ? ? ? ?Subjective: ?(Chief complaint) ? ?Patient denies pain. ? ?Feeling well. ? ?Tolerated liquids. ? ?Objective: ? ?Vital signs: ? ?Vitals:  ? 05/27/21 1850 05/27/21 2148 05/28/21 0155 05/28/21 0537  ?BP: (!) 144/92 113/75 109/74 98/67  ?Pulse: 87 89 78 73  ?Resp: '14 18 16 16  '$ ?Temp:  97.7 ?F (36.5 ?C) 98.2 ?F (36.8 ?C) (!) 97.4 ?F (36.3 ?C)  ?TempSrc:  Oral Oral Oral  ?SpO2: 95% 95% 96% 96%  ?Weight:    106.1 kg  ?Height:      ? ? ?Last BM Date : 05/26/21 ? ?Intake/Output  ? ?Yesterday: ? 03/22 0701 - 03/23 0700 ?In: 3375.7 [I.V.:3275.7; IV Piggyback:100] ?Out: 0347 [Urine:950; Drains:440; Stool:100; Blood:100] ?This shift: ? No intake/output data recorded. ? ?Bowel function: ? Flatus: No ? BM:  No ? Drain: Mostly serous but slight brown tinge ? ? ?Physical Exam: ? ?General: Pt awake/alert in no acute distress ?Eyes: PERRL, normal EOM.  Sclera clear.  No icterus ?Neuro: CN II-XII intact w/o focal sensory/motor deficits. ?Lymph: No head/neck/groin lymphadenopathy ?Psych:  No delerium/psychosis/paranoia.  Oriented x 4 ?HENT: Normocephalic, Mucus membranes moist.  No thrush ?Neck: Supple, No tracheal deviation.  No obvious thyromegaly ?Chest: No pain to chest wall compression.  Good respiratory excursion.  No audible wheezing ?CV:  Pulses intact.  Regular rhythm.  No major extremity edema ?MS: Normal AROM mjr joints.  No obvious deformity ? ?Abdomen: Soft.  Mildy distended.  Mildly tender at incisions only.  No evidence of  peritonitis.  No incarcerated hernias. ?Ileostomy right abdomen pink and edematous.  No gas nor succus and stool ? ?Ext:   No deformity.  No mjr edema.  No cyanosis ?Skin: No petechiae / purpurea.  No major sores.  Warm and dry ? ? ? ?Results:  ? ?Cultures: ?No results found for this or any previous visit (from the past 720 hour(s)). ? ?Labs: ?Results for orders placed or performed during the hospital encounter of 05/27/21 (from the past 48 hour(s))  ?ABO/Rh     Status: None  ? Collection Time: 05/27/21  7:00 AM  ?Result Value Ref Range  ? ABO/RH(D)    ?  O POS ?Performed at [facility], [place] [address] [place], [place] [address] ?  ?Glucose, capillary     Status: Abnormal  ? Collection Time: 05/27/21  2:18 PM  ?Result Value Ref Range  ? Glucose-Capillary 257 (H) 70 - 99 mg/dL  ?  Comment: Glucose reference range applies only to samples taken after fasting for at least 8 hours.  ?Glucose, capillary     Status: Abnormal  ? Collection Time: 05/27/21  5:35 PM  ?Result Value Ref Range  ? Glucose-Capillary 299 (H) 70 - 99 mg/dL  ?  Comment: Glucose reference range applies only to samples taken after fasting for at least 8 hours.  ?  Glucose, capillary     Status: Abnormal  ? Collection Time: 05/27/21  9:50 PM  ?Result Value Ref Range  ? Glucose-Capillary 255 (H) 70 - 99 mg/dL  ?  Comment: Glucose reference range applies only to samples taken after fasting for at least 8 hours.  ?Basic metabolic panel     Status: Abnormal  ? Collection Time: 05/28/21  4:00 AM  ?Result Value Ref Range  ? Sodium 134 (L) 135 - 145 mmol/L  ? Potassium 4.4 3.5 - 5.1 mmol/L  ? Chloride 104 98 - 111 mmol/L  ? CO2 23 22 - 32 mmol/L  ? Glucose, Bld 193 (H) 70 - 99 mg/dL  ?  Comment: Glucose reference range applies only to samples taken after fasting for at least 8 hours.  ? BUN 28 (H) 8 - 23 mg/dL  ? Creatinine, Ser 1.74 (H) 0.61 - 1.24 mg/dL  ? Calcium 7.8 (L) 8.9 - 10.3 mg/dL  ? GFR, Estimated 43 (L) >60 mL/min  ?  Comment:  (NOTE) ?Calculated using the CKD-EPI Creatinine Equation (2021) ?  ? Anion gap 7 5 - 15  ?  Comment: Performed at [facility], [place] [address], [place], [place] [address]  ?CBC     Status: Ab

## 2021-05-28 NOTE — Consult Note (Signed)
Initial Consultation Note ? ? ?Patient: William Good TOI:[id number] DOB: 1955-03-27 PCP: Aura Dials, MD ?DOA: 05/27/2021 ?DOS: the patient was seen and examined on 05/28/2021 ?Primary service: Michael Boston, MD ? ?Referring physician: Michael Boston, MD ?Reason for consult: Type 2 diabetes and CKD.  ? ? ?Assessment and Plan: ?Principal Problem: ?  Rectal cancer ([place]) ?Just underwent surgery yesterday. ?Continue postop management per Dr. Johney Maine. ? ?Active Problems: ?  AKI (acute kidney injury) ([place]) ?Superimposed on: ?  Polycystic kidney ?  Chronic kidney disease, stage 2 ?Discontinue Vasotec. ?Begin IV fluids. ?Continue strict I&O. ?Check urinalysis ?Avoid hypotension. ?Avoid nephrotoxins. ?Follow renal function and electrolytes in AM. ? ?  Essential hypertension ?Continue propranolol ER 160 mg p.o. bedtime. ?Continue holding Lotensin/HCT 20/12.5 mg daily. ?Discontinue IV enalapril. ?Continue IV metoprolol as needed. ?Monitor BP, renal function electrolytes. ? ?  Migraine ?On propranolol. ?Sumatriptan as needed. ?Analgesics as needed. ? ?  Type 2 diabetes mellitus with complication,  ?   without long-term current use of insulin ([place]) ?Recent hemoglobin A1c was 7.3%. ?Advance diet per surgical team. ?Continue CBG monitoring with RI SS. ? ?  Bipolar disorder ([place]) ?Continue mirtazapine 45 mg p.o. at bedtime. ?Continue risperidone 250 mcg at bedtime. ?Continue alprazolam 1 mg p.o. twice daily. ? ?  Coronary artery calcification ?Continue aspirin, statin and beta-blocker. ?Recent echo with grade 1 diastolic dysfunction. ?Follow-up with cardiology as an outpatient. ? ? ?TRH will continue to follow the patient. ? ?HPI: William Good is a 66 y.o. male with past medical history of chronic back pain, bipolar 1 disorder, stage II CKD, hypertension, knee pain, migraine headaches, type 2 diabetes, history of rectal cancer who underwent a robotic low anterior rectosigmoid resection with a very low coloanal  anastomosis and diverting loop ileostomy who we are evaluating due to uncontrolled diabetes and chronic kidney disease. ? ?Review of Systems: As mentioned in the history of present illness. All other systems reviewed and are negative. ?Past Medical History:  ?Diagnosis Date  ? Back pain   ? Bipolar disorder ([place])   ? bipolar 1  ? CKD (chronic kidney disease) stage 2, GFR 60-89 ml/min 09/24/2020  ? Hypertension   ? Knee pain   ? Migraine   ? Rectal cancer ([place])   ? Type 2 diabetes mellitus with complication, without long-term current use of insulin ([place]) 03/19/2021  ? ?Past Surgical History:  ?Procedure Laterality Date  ? BACK SURGERY    ? JOINT REPLACEMENT Right   ? knee  ? OSTOMY N/A 05/27/2021  ? Procedure: POSSIBLE OSTOMY;  Surgeon: Michael Boston, MD;  Location: [facility];  Service: General;  Laterality: N/A;  ? PORTACATH PLACEMENT N/A 10/02/2020  ? Procedure: PORT-A-CATH PLACEMENT;  Surgeon: Dwan Bolt, MD;  Location: [facility];  Service: General;  Laterality: N/A;  ? PROCTOSCOPY N/A 05/27/2021  ? Procedure: RIGID PROCTOSCOPY;  Surgeon: Michael Boston, MD;  Location: [facility];  Service: General;  Laterality: N/A;  ? XI ROBOTIC ASSISTED LOWER ANTERIOR RESECTION N/A 05/27/2021  ? Procedure: ROBOTIC LOW ANTERIOR COLON RESECTION, VERY LOW COLOANAL ANASTOMOSIS, DIVERTING ILEOSTOMY, BILATERAL TAP BLOCK,  FIRFLY INJECTION;  Surgeon: Michael Boston, MD;  Location: [facility];  Service: General;  Laterality: N/A;  ? ?Social History:  reports that he quit smoking about 5 weeks ago. His smoking use included cigarettes. He has a 22.50 pack-year smoking history. He has never used smokeless tobacco. He reports that he does not drink alcohol and does not use drugs. ? ?No Known Allergies ? ?  Family History  ?Problem Relation Age of Onset  ? ALS Mother   ? Heart failure Father   ? ? ?Prior to Admission medications   ?Medication Sig Start Date End Date Taking? Authorizing Provider  ?acetaminophen (TYLENOL) 325 MG tablet Take 650 mg every 4  (four) hours as needed by mouth for mild pain.   Yes [provider]  ?albuterol (VENTOLIN HFA) 108 (90 Base) MCG/ACT inhaler Inhale 2 puffs into the lungs every 6 (six) hours as needed for wheezing or shortness of breath. 10/24/20  Yes Owens Shark, NP  ?ALPRAZolam (XANAX) 1 MG tablet Take 1 mg by mouth 2 (two) times daily. 01/08/17  Yes [provider]  ?aspirin EC 81 MG tablet Take 81 mg by mouth daily. Swallow whole.   Yes [provider]  ?Cholecalciferol (VITAMIN D3) 50 MCG (2000 UT) capsule Take 2,000 Units by mouth daily. 03/08/20  Yes [provider]  ?HYDROcodone-acetaminophen (NORCO/VICODIN) 5-325 MG tablet Take 0.5-1 tablets by mouth every 12 (twelve) hours as needed for moderate pain. 04/01/21  Yes Owens Shark, NP  ?loperamide (IMODIUM) 2 MG capsule Take 2 mg by mouth as needed for diarrhea or loose stools.   Yes [provider]  ?LOTENSIN HCT 20-12.5 MG per tablet TAKE (2) TABLETS DAILY ?Patient taking differently: Take 0.5 tablets by mouth daily. 03/11/11  Yes Larey Dresser, MD  ?magnesium oxide (MAG-OX) 400 (240 Mg) MG tablet TAKE ONE TABLET TWICE DAILY 05/15/21  Yes Ladell Pier, MD  ?mirtazapine (REMERON) 45 MG tablet Take 45 mg by mouth at bedtime.   Yes [provider]  ?omeprazole (PRILOSEC) 20 MG capsule Take 1 capsule (20 mg total) 2 (two) times daily by mouth. 01/17/17  Yes Tanna Furry, MD  ?polyethylene glycol powder (GLYCOLAX/MIRALAX) 17 GM/SCOOP powder Take 17 g by mouth daily as needed for moderate constipation.   Yes [provider]  ?potassium chloride SA (KLOR-CON M) 20 MEQ tablet TAKE ONE TABLET ONCE DAILY 05/26/21  Yes Ladell Pier, MD  ?pravastatin (PRAVACHOL) 40 MG tablet Take 40 mg by mouth daily. 06/26/20  Yes [provider]  ?propranolol ER (INDERAL LA) 160 MG SR capsule Take 160 mg daily by mouth. 10/25/16  Yes [provider]  ?risperiDONE (RISPERDAL) 0.25 MG tablet Take 0.25 mg by mouth at  bedtime. 12/22/20  Yes [provider]  ?SUMAtriptan (IMITREX) 100 MG tablet Take 100 mg by mouth 2 (two) times daily as needed for migraine. 01/07/17  Yes [provider]  ?lidocaine-prilocaine (EMLA) cream Apply 1 application topically as directed. Apply 1/2 tablespoon to port-a-cath site 2 hours prior to stick and cover with plastic wrap to numb site ?Patient not taking: Reported on 05/12/2021 10/01/20   Ladell Pier, MD  ?ondansetron (ZOFRAN) 8 MG tablet Take 1 tablet (8 mg total) by mouth every 8 (eight) hours as needed for nausea or vomiting. ?Patient not taking: Reported on 05/12/2021 10/01/20   Ladell Pier, MD  ?prochlorperazine (COMPAZINE) 10 MG tablet Take 1 tablet (10 mg total) by mouth every 6 (six) hours as needed for nausea. ?Patient not taking: Reported on 05/12/2021 11/05/20   Owens Shark, NP  ? ? ?Physical Exam: ?Vitals:  ? 05/27/21 1850 05/27/21 2148 05/28/21 0155 05/28/21 0537  ?BP: (!) 144/92 113/75 109/74 98/67  ?Pulse: 87 89 78 73  ?Resp: '14 18 16 16  '$ ?Temp:  97.7 ?F (36.5 ?C) 98.2 ?F (36.8 ?C) (!) 97.4 ?F (36.3 ?C)  ?TempSrc:  Oral Oral Oral  ?SpO2: 95% 95% 96% 96%  ?Weight:    106.1 kg  ?Height:      ? ?Physical Exam ?Vitals and nursing note reviewed.  ?Constitutional:   ?   Appearance: He is obese.  ?HENT:  ?   Head: Normocephalic.  ?   Mouth/Throat:  ?   Mouth: Mucous membranes are moist.  ?Eyes:  ?   General: No scleral icterus. ?   Pupils: Pupils are equal, round, and reactive to light.  ?Neck:  ?   Vascular: No JVD.  ?Cardiovascular:  ?   Rate and Rhythm: Normal rate.  ?Pulmonary:  ?   Effort: Pulmonary effort is normal.  ?Abdominal:  ?   General: Bowel sounds are normal.  ?   Palpations: Abdomen is soft.  ?   Tenderness: There is no guarding or rebound.  ?   Comments: ileostomy in place with mild incisional tenderness.  ?Musculoskeletal:  ?   Cervical back: Neck supple.  ?   Right lower leg: No edema.  ?   Left lower leg: No edema.  ?Skin: ?   General: Skin is warm  and dry.  ?Neurological:  ?   General: No focal deficit present.  ?   Mental Status: He is alert and oriented to person, place, and time.  ?Psychiatric:     ?   Mood and Affect: Mood normal.     ?   Behavior: Karma [place]

## 2021-05-28 NOTE — Evaluation (Signed)
Occupational Therapy Evaluation ?Patient Details ?Name: William Good ?MRN: [id number] ?DOB: 1955-11-11 ?Today's Date: 05/28/2021 ? ? ?History of Present Illness Pt s/p low anterior rectosigmoid resection, coloanal anastomisis, and diverting loop ileostomy 2* rectal CA.  Pt with CKD, DM, back surgery, R TKR and CKD  ? ?Clinical Impression ?  ?[name] is a 66 year old man who presents s/p abdominal surgery and now with ileostomy. On evaluation patient needing assistance to transfer in and out of bed, limited to standing at edge of bed due to symptoms of dizziness an nausea and requiring max assist for LB ADLs. Patient's BP in sitting was114/76 and standing 104/93 and patient symptomatic and limiting further activity. Patient will benefit from skilled OT services while in hospital to improve deficits and learn compensatory strategies as needed in order to return to PLOF.  Expect patient should improve while in hospital in order to go home at discharge. Of note patient only has intermittent assistance from family. If patient doesn't progress well he may need short term rehab.    ? ?Recommendations for follow up therapy are one component of a multi-disciplinary discharge planning process, led by the attending physician.  Recommendations may be updated based on patient status, additional functional criteria and insurance authorization.  ? ?Follow Up Recommendations ? Home health OT  ?  ?Assistance Recommended at Discharge Frequent or constant Supervision/Assistance  ?Patient can return home with the following A little help with walking and/or transfers;A lot of help with bathing/dressing/bathroom;Assistance with cooking/housework;Assist for transportation;Help with stairs or ramp for entrance ? ?  ?Functional Status Assessment ? Patient has had a recent decline in their functional status and demonstrates the ability to make significant improvements in function in a reasonable and predictable amount of  time.  ?Equipment Recommendations ? None recommended by OT  ?  ?Recommendations for Other Services   ? ? ?  ?Precautions / Restrictions Precautions ?Precautions: Fall ?Precaution Comments: new ileostomy, JP drain on R ?Restrictions ?Weight Bearing Restrictions: No  ? ?  ? ?Mobility Bed Mobility ?  ?  ?  ?  ?  ?  ?  ?  ?  ? ?Transfers ?  ?  ?  ?  ?  ?  ?  ?  ?  ?  ?  ? ?  ?Balance Overall balance assessment: Needs assistance ?Sitting-balance support: No upper extremity supported, Feet supported ?Sitting balance-Leahy Scale: Fair ?  ?  ?Standing balance support: Bilateral upper extremity supported ?Standing balance-Leahy Scale: Poor ?Standing balance comment: knees buckling in standing ?  ?  ?  ?  ?  ?  ?  ?  ?  ?  ?  ?   ? ?ADL either performed or assessed with clinical judgement  ? ?ADL Overall ADL's : Needs assistance/impaired ?Eating/Feeding: Set up;Sitting ?  ?Grooming: Set up;Sitting ?  ?Upper Body Bathing: Set up;Sitting ?  ?Lower Body Bathing: Moderate assistance;Sit to/from stand ?  ?Upper Body Dressing : Set up;Sitting ?  ?Lower Body Dressing: Maximal assistance;Sit to/from stand ?  ?Toilet Transfer: Minimal assistance;BSC/3in1;+2 for safety/equipment ?  ?Toileting- Clothing Manipulation and Hygiene: Maximal assistance;Sit to/from stand ?  ?  ?  ?Functional mobility during ADLs: Minimal assistance;+2 for safety/equipment;Rolling walker (2 wheels) ?General ADL Comments: Activity limited to standing at side of bed due to complaints of being hot, dizziness, nausea. BP monitored - see vital signs.  ? ? ? ?Vision Baseline Vision/History: 0 No visual deficits ?   ?   ?Perception   ?  ?  Praxis   ?  ? ?Pertinent Vitals/Pain Pain Assessment ?Pain Assessment: 0-10 ?Pain Score: 4  ?Pain Location: abdomen ?Pain Intervention(s): Limited activity within patient's tolerance, RN gave pain meds during session  ? ? ? ?Hand Dominance   ?  ?Extremity/Trunk Assessment Upper Extremity Assessment ?Upper Extremity Assessment:  Overall WFL for tasks assessed ?  ?Lower Extremity Assessment ?Lower Extremity Assessment: Defer to PT evaluation ?RLE Sensation: history of peripheral neuropathy ?LLE Sensation: history of peripheral neuropathy ?  ?Cervical / Trunk Assessment ?Cervical / Trunk Assessment: Normal ?  ?Communication Communication ?Communication: No difficulties ?  ?Cognition Arousal/Alertness: Awake/alert ?Behavior During Therapy: [facility] for tasks assessed/performed ?Overall Cognitive Status: Within Functional Limits for tasks assessed ?  ?  ?  ?  ?  ?  ?  ?  ?  ?  ?  ?  ?  ?  ?  ?  ?  ?  ?  ?General Comments    ? ?  ?Exercises   ?  ?Shoulder Instructions    ? ? ?Home Living Family/patient expects to be discharged to:: Private residence ?Living Arrangements: Alone ?Available Help at Discharge: Family;Available PRN/intermittently ?Type of Home: Mobile home ?Home Access: Ramped entrance ?  ?  ?Home Layout: One level ?  ?  ?  ?  ?  ?  ?  ?Home Equipment: Conservation officer, nature (2 wheels) ?  ?  ?  ? ?  ?Prior Functioning/Environment Prior Level of Function : Independent/Modified Independent ?  ?  ?  ?  ?  ?  ?Mobility Comments: Pt admits to falls ?  ?  ? ?  ?  ?OT Problem List: Decreased strength;Decreased activity tolerance;Impaired balance (sitting and/or standing);Decreased knowledge of precautions;Decreased knowledge of use of DME or AE;Pain ?  ?   ?OT Treatment/Interventions: Self-care/ADL training;DME and/or AE instruction;Therapeutic activities;Balance training;Patient/family education  ?  ?OT Goals(Current goals can be found in the care plan section) Acute Rehab OT Goals ?Patient Stated Goal: get stronger ?OT Goal Formulation: With patient ?Time For Goal Achievement: 06/11/21 ?Potential to Achieve Goals: Good  ?OT Frequency: Min 2X/week ?  ? ?Co-evaluation PT/OT/SLP Co-Evaluation/Treatment: Yes (co-eval) ?Reason for Co-Treatment: For patient/therapist safety ?PT goals addressed during session: Mobility/safety with mobility ?OT goals  addressed during session: ADL's and self-care ?  ? ?  ?AM-PAC OT "6 Clicks" Daily Activity     ?Outcome Measure Help from another person eating meals?: A Little ?Help from another person taking care of personal grooming?: A Little ?Help from another person toileting, which includes using toliet, bedpan, or urinal?: A Lot ?Help from another person bathing (including washing, rinsing, drying)?: A Lot ?Help from another person to put on and taking off regular upper body clothing?: A Little ?Help from another person to put on and taking off regular lower body clothing?: A Lot ?6 Click Score: 15 ?  ?End of Session Equipment Utilized During Treatment: Rolling walker (2 wheels) ?Nurse Communication: Mobility status ? ?Activity Tolerance: Patient limited by fatigue ?Patient left: in bed;with call bell/phone within reach;with bed alarm set ? ?OT Visit Diagnosis: Muscle weakness (generalized) (M62.81)  ?              ?Time: 6568-1275 ?OT Time Calculation (min): 27 min ?Charges:  OT General Charges ?$OT Visit: 1 Visit ?OT Evaluation ?$OT Eval Low Complexity: 1 Low ? ?Jenene Kauffmann, OTR/L ?Acute Care Rehab Services  ?Office [phone] ?Pager: [phone]  ? ?Kelley Polinsky L Tomislav Micale ?05/28/2021, 11:52 AM ?

## 2021-05-28 NOTE — Consult Note (Signed)
[facility] Nurse ostomy consult note ?Stoma type/location: RLQ, loop ileostomy  ?Output liquid dark, melanous  ?Ostomy pouching: 2pc. 2 3/4" in place from OR ? ?Education provided:  ?Spent a great deal of time with patient to determine his level of function at home prior to admission.  Patient lives alone, his brother lives across the street and manages his pill box/med. Checks on him daily but works full time.  When asked about his brother coming in to learn how to assist with ostomy pouch and support patient at home, he immediately indicates "he will not be interested in that".  When I am explaining the frequency at which the patient will need to empty and re-hydrate to prevent dehydration he seems concerned.  We discussed rehab and he reports "yes, that is what I wanted to do".  I have communicated with bedside nurse who reports patient has reported recent falls at home and we I discuss with patient need to be able to get to the bathroom several times per day he also seems concerned.  I have communicated with rehab team on this as well.  Patient agreeable I will change pouch tomorrow and he will observe. We did go over lock and roll closure; cleaning with a wick and importance/rationale for doing so; and need to empty when 1/2 or less full; when I arrived the pouch is over 1/2 full which offered me an excellent opportunity to demonstrate and discuss this concept.  Patient seems engaged but ready to participate.  ? ?Enrolled patient in [place] program: Yes ? ?[place] Nurse will follow along with you for continued support with ostomy teaching and care ?Reigan Tolliver Liane Comber MSN, RN, [place], [place], [place] ?[phone]  ? ? ?

## 2021-05-28 NOTE — Progress Notes (Signed)
Patient noted with significant amount of drainage in bed. Dressing around drain site saturated in serosanguineous fluid. Dressing changes, JP tubing milked as well as flushed.  ?

## 2021-05-28 NOTE — Evaluation (Signed)
Physical Therapy Evaluation ?Patient Details ?Name: William Good ?MRN: [id number] ?DOB: [birth year] ?Today's Date: 05/28/2021 ? ?History of Present Illness ? Pt s/p low anterior rectosigmoid resection, coloanal anastomisis, and diverting loop ileostomy 2* rectal CA.  Pt with CKD, DM, back surgery, R TKR and CKD  ?Clinical Impression ? Pt admitted as above and presenting with functional mobility limitations 2* generalized weakness, post op pain, limited endurance and c/o nausea with attempts to mobilize.  This date, pt limited to moving to EOB sitting, to standing and to side step up to top of bed.  BP sitting 114/76 and standing 104/93 with RN present.  Pt hopes to progress to dc home with intermittent assist of family. ?   ? ?Recommendations for follow up therapy are one component of a multi-disciplinary discharge planning process, led by the attending physician.  Recommendations may be updated based on patient status, additional functional criteria and insurance authorization. ? ?Follow Up Recommendations Home health PT (dependent on acute stay progress) ? ?  ?Assistance Recommended at Discharge Frequent or constant Supervision/Assistance  ?Patient can return home with the following ? A lot of help with walking and/or transfers;A lot of help with bathing/dressing/bathroom;Assistance with cooking/housework;Assist for transportation;Help with stairs or ramp for entrance ? ?  ?Equipment Recommendations None recommended by PT  ?Recommendations for Other Services ?    ?  ?Functional Status Assessment Patient has had a recent decline in their functional status and demonstrates the ability to make significant improvements in function in a reasonable and predictable amount of time.  ? ?  ?Precautions / Restrictions Precautions ?Precautions: Fall ?Precaution Comments: new ileostomy, JP drain on R ?Restrictions ?Weight Bearing Restrictions: No  ? ?  ? ?Mobility ? Bed Mobility ?Overal bed mobility: Needs Assistance ?Bed  Mobility: Rolling, Sidelying to Sit, Sit to Sidelying ?Rolling: Min assist ?Sidelying to sit: Min assist, Mod assist ?  ?  ?Sit to sidelying: Min assist, Mod assist ?General bed mobility comments: increased time with cues for log roll technique ?  ? ?Transfers ?Overall transfer level: Needs assistance ?Equipment used: Rolling walker (2 wheels) ?Transfers: Sit to/from Stand ?Sit to Stand: Min assist, Mod assist, +2 safety/equipment, From elevated surface ?  ?  ?  ?  ?  ?General transfer comment: cues for use of UEs to self assist.  Physical assist to bring wt up and fwd and to balance in standing ?  ? ?Ambulation/Gait ?Ambulation/Gait assistance: Min assist, +2 physical assistance, +2 safety/equipment ?Gait Distance (Feet): 3 Feet ?Assistive device: Rolling walker (2 wheels) ?Gait Pattern/deviations: Step-to pattern, Decreased step length - right, Decreased step length - left, Shuffle ?  ?  ?  ?General Gait Details: pt side stepped up side of bed only - limited by c/o nausea, dizziness, fatigue ? ?Stairs ?  ?  ?  ?  ?  ? ?Wheelchair Mobility ?  ? ?Modified Rankin (Stroke Patients Only) ?  ? ?  ? ?Balance Overall balance assessment: Needs assistance ?Sitting-balance support: No upper extremity supported, Feet supported ?Sitting balance-Leahy Scale: Fair ?  ?  ?Standing balance support: Bilateral upper extremity supported ?Standing balance-Leahy Scale: Poor ?Standing balance comment: knees buckling in standing ?  ?  ?  ?  ?  ?  ?  ?  ?  ?  ?  ?   ? ? ? ?Pertinent Vitals/Pain Pain Assessment ?Pain Assessment: 0-10 ?Pain Score: 4  ?Pain Location: abdomen ?Pain Descriptors / Indicators: Guarding, Sore ?Pain Intervention(s): Limited activity within patient's tolerance, Monitored during  session, RN gave pain meds during session  ? ? ?Home Living Family/patient expects to be discharged to:: Private residence ?Living Arrangements: Alone ?Available Help at Discharge: Family;Available PRN/intermittently ?Type of Home: Mobile  home ?Home Access: Ramped entrance ?  ?  ?  ?Home Layout: One level ?Home Equipment: Conservation officer, nature (2 wheels) ?   ?  ?Prior Function Prior Level of Function : Independent/Modified Independent ?  ?  ?  ?  ?  ?  ?Mobility Comments: Pt admits to falls ?  ?  ? ? ?Hand Dominance  ?   ? ?  ?Extremity/Trunk Assessment  ? Upper Extremity Assessment ?Upper Extremity Assessment: Defer to OT evaluation ?  ? ?Lower Extremity Assessment ?Lower Extremity Assessment: RLE deficits/detail;LLE deficits/detail;Generalized weakness ?RLE Sensation: history of peripheral neuropathy ?LLE Sensation: history of peripheral neuropathy ?  ? ?   ?Communication  ? Communication: No difficulties  ?Cognition Arousal/Alertness: Awake/alert ?Behavior During Therapy: [facility] for tasks assessed/performed ?Overall Cognitive Status: Within Functional Limits for tasks assessed ?  ?  ?  ?  ?  ?  ?  ?  ?  ?  ?  ?  ?  ?  ?  ?  ?  ?  ?  ? ?  ?General Comments   ? ?  ?Exercises General Exercises - Lower Extremity ?Ankle Circles/Pumps: AROM, Both, 15 reps, Supine  ? ?Assessment/Plan  ?  ?PT Assessment Patient needs continued PT services  ?PT Problem List Decreased strength;Decreased range of motion;Decreased activity tolerance;Decreased balance;Decreased mobility;Decreased knowledge of use of DME;Pain ? ?   ?  ?PT Treatment Interventions DME instruction;Gait training;Functional mobility training;Therapeutic activities;Therapeutic exercise;Patient/family education   ? ?PT Goals (Current goals can be found in the Care Plan section)  ?Acute Rehab PT Goals ?Patient Stated Goal: Regain IND ?PT Goal Formulation: With patient ?Time For Goal Achievement: 06/11/21 ?Potential to Achieve Goals: Good ? ?  ?Frequency Min 3X/week ?  ? ? ?Co-evaluation PT/OT/SLP Co-Evaluation/Treatment: Yes ?Reason for Co-Treatment: For patient/therapist safety ?PT goals addressed during session: Mobility/safety with mobility ?OT goals addressed during session: ADL's and self-care ?  ? ? ?   ?AM-PAC PT "6 Clicks" Mobility  ?Outcome Measure Help needed turning from your back to your side while in a flat bed without using bedrails?: A Little ?Help needed moving from lying on your back to sitting on the side of a flat bed without using bedrails?: A Lot ?Help needed moving to and from a bed to a chair (including a wheelchair)?: A Lot ?Help needed standing up from a chair using your arms (e.g., wheelchair or bedside chair)?: A Lot ?Help needed to walk in hospital room?: Total ?Help needed climbing 3-5 steps with a railing? : Total ?6 Click Score: 11 ? ?  ?End of Session   ?Activity Tolerance: Patient limited by fatigue;Other (comment) (c/o nausea) ?Patient left: in bed;with call bell/phone within reach;with nursing/sitter in room ?Nurse Communication: Mobility status ?PT Visit Diagnosis: Unsteadiness on feet (R26.81);History of falling (Z91.81);Difficulty in walking, not elsewhere classified (R26.2);Pain ?Pain - part of body:  (abdomen) ?  ? ?Time: 1610-9604 ?PT Time Calculation (min) (ACUTE ONLY): 27 min ? ? ?Charges:   PT Evaluation ?$PT Eval Low Complexity: 1 Low ?  ?  ?   ? ? ?Debe Coder PT ?Acute Rehabilitation Services ?Pager [phone] ?Office [phone] ? ? ?Ruble Pumphrey ?05/28/2021, 11:16 AM ? ?

## 2021-05-29 ENCOUNTER — Inpatient Hospital Stay: Payer: Self-pay

## 2021-05-29 ENCOUNTER — Inpatient Hospital Stay (HOSPITAL_COMMUNITY): Payer: Medicare Other

## 2021-05-29 DIAGNOSIS — Q613 Polycystic kidney, unspecified: Secondary | ICD-10-CM

## 2021-05-29 DIAGNOSIS — N1831 Chronic kidney disease, stage 3a: Secondary | ICD-10-CM

## 2021-05-29 DIAGNOSIS — E118 Type 2 diabetes mellitus with unspecified complications: Secondary | ICD-10-CM

## 2021-05-29 DIAGNOSIS — N179 Acute kidney failure, unspecified: Secondary | ICD-10-CM

## 2021-05-29 DIAGNOSIS — C20 Malignant neoplasm of rectum: Secondary | ICD-10-CM | POA: Diagnosis not present

## 2021-05-29 DIAGNOSIS — I10 Essential (primary) hypertension: Secondary | ICD-10-CM

## 2021-05-29 DIAGNOSIS — R198 Other specified symptoms and signs involving the digestive system and abdomen: Secondary | ICD-10-CM

## 2021-05-29 HISTORY — DX: Other specified symptoms and signs involving the digestive system and abdomen: R19.8

## 2021-05-29 LAB — HEMOGLOBIN: Hemoglobin: 12.4 g/dL — ABNORMAL LOW (ref 13.0–17.0)

## 2021-05-29 LAB — GLUCOSE, CAPILLARY
Glucose-Capillary: 183 mg/dL — ABNORMAL HIGH (ref 70–99)
Glucose-Capillary: 167 mg/dL — ABNORMAL HIGH (ref 70–99)
Glucose-Capillary: 248 mg/dL — ABNORMAL HIGH (ref 70–99)
Glucose-Capillary: 168 mg/dL — ABNORMAL HIGH (ref 70–99)

## 2021-05-29 LAB — RENAL FUNCTION PANEL
Albumin: 3.3 g/dL — ABNORMAL LOW (ref 3.5–5.0)
Anion gap: 6 (ref 5–15)
BUN: 30 mg/dL — ABNORMAL HIGH (ref 8–23)
CO2: 23 mmol/L (ref 22–32)
Calcium: 7.6 mg/dL — ABNORMAL LOW (ref 8.9–10.3)
Chloride: 104 mmol/L (ref 98–111)
Creatinine, Ser: 1.95 mg/dL — ABNORMAL HIGH (ref 0.61–1.24)
GFR, Estimated: 37 mL/min — ABNORMAL LOW (ref >60)
Glucose, Bld: 179 mg/dL — ABNORMAL HIGH (ref 70–99)
Phosphorus: 4.8 mg/dL — ABNORMAL HIGH (ref 2.5–4.6)
Potassium: 4.1 mmol/L (ref 3.5–5.1)
Sodium: 133 mmol/L — ABNORMAL LOW (ref 135–145)

## 2021-05-29 MED ORDER — FERROUS SULFATE 325 (65 FE) MG PO TABS
325.0000 mg | ORAL_TABLET | Freq: Two times a day (BID) | ORAL | Status: DC
Start: 2021-05-29 — End: 2021-05-30
  Administered 2021-05-29 – 2021-05-30 (×3): 325 mg via ORAL
  Filled 2021-05-29 (×3): qty 1

## 2021-05-29 MED ORDER — SODIUM CHLORIDE 0.9 % IV BOLUS
250.0000 mL | Freq: Once | INTRAVENOUS | Status: AC
Start: 2021-05-29 — End: 2021-05-29
  Administered 2021-05-29: 250 mL via INTRAVENOUS

## 2021-05-29 MED ORDER — LOPERAMIDE HCL 2 MG PO CAPS: 2.0000 mg | ORAL_CAPSULE | Freq: Four times a day (QID) | ORAL | Status: DC | PRN

## 2021-05-29 MED ORDER — LACTATED RINGERS IV BOLUS: 1000.0000 mL | Freq: Three times a day (TID) | INTRAVENOUS | Status: DC | PRN

## 2021-05-29 MED ORDER — LOPERAMIDE HCL 2 MG PO CAPS
2.0000 mg | ORAL_CAPSULE | Freq: Two times a day (BID) | ORAL | Status: DC
  Administered 2021-05-29 (×2): 2 mg via ORAL
  Filled 2021-05-29 (×3): qty 1

## 2021-05-29 MED ORDER — LACTATED RINGERS IV BOLUS
1000.0000 mL | Freq: Once | INTRAVENOUS | Status: AC
Start: 2021-05-29 — End: 2021-05-29
  Administered 2021-05-29: 1000 mL via INTRAVENOUS

## 2021-05-29 MED ORDER — INSULIN ASPART 100 UNIT/ML IJ SOLN
2.0000 [IU] | Freq: Three times a day (TID) | INTRAMUSCULAR | Status: DC
  Administered 2021-05-29 – 2021-06-05 (×17): 2 [IU] via SUBCUTANEOUS

## 2021-05-29 MED ORDER — LACTATED RINGERS IV SOLN: INTRAVENOUS | Status: DC

## 2021-05-29 NOTE — TOC Initial Note (Signed)
Transition of Care (TOC) - Initial/Assessment Note  ? ? ?Patient Details  ?Name: William Good ?MRN: [id number] ?Date of Birth: [birth year] ? ?Transition of Care (TOC) CM/SW Contact:    ?William Marzella, LCSW ?Phone Number: ?05/29/2021, 2:23 PM ? ?Clinical Narrative:                 ?Met with pt today to introduce self/ TOC role with dc planning needs.  Pt very pleasant and engaged but admits very fatigued.  Pt confirms that he lives alone with brother living "across the street" who works days.  He has limited support and is very concerned about being able to manage at home alone.  Pt does not feel that his brother would be able to assist him with new colostomy care or with ADLs/ mobility.  Pt requests assistance with SNF placement for rehab.  We discussed the process for SNF and pt wishes to proceed. No facility preferences.  Pt has had COVID vaccine x 2 ? ?Expected Discharge Plan: [place] ?Barriers to Discharge: Continued Medical Work up, Ship broker, [place] (PASRR), SNF Pending bed offer ? ? ?Patient Goals and CMS Choice ?Patient states their goals for this hospitalization and ongoing recovery are:: to return home following SNF rehab ?  ?  ? ?Expected Discharge Plan and Services ?Expected Discharge Plan: [place] ?In-house Referral: Clinical Social Work ?  ?  ?Living arrangements for the past 2 months: [place] ?                ?DME Arranged: N/A ?DME Agency: NA ?  ?  ?  ?  ?  ?  ?  ?  ? ?Prior Living Arrangements/Services ?Living arrangements for the past 2 months: [place] ?Lives with:: Self ?Patient language and need for interpreter reviewed:: Yes ?Do you feel safe going back to the place where you live?: Yes      ?Need for Family Participation in Patient Care: Yes (Comment) ?Care giver support system in place?: No (comment) ?  ?Criminal Activity/Legal Involvement Pertinent to Current Situation/Hospitalization: No - Comment as  needed ? ?Activities of Daily Living ?Home Assistive Devices/Equipment: Chana Bode (specify type), Cane (specify quad or straight), Dentures (specify type) ?ADL Screening (condition at time of admission) ?Patient's cognitive ability adequate to safely complete daily activities?: Yes ?Is the patient deaf or have difficulty hearing?: No ?Does the patient have difficulty seeing, even when wearing glasses/contacts?: No ?Does the patient have difficulty concentrating, remembering, or making decisions?: Yes ?Patient able to express need for assistance with ADLs?: Yes ?Does the patient have difficulty dressing or bathing?: No ?Independently performs ADLs?: Yes (appropriate for developmental age) ?Does the patient have difficulty walking or climbing stairs?: No ?Weakness of Legs: None ?Weakness of Arms/Hands: None ? ?Permission Sought/Granted ?Permission sought to share information with : Facility Sport and exercise psychologist, Family Supports ?Permission granted to share information with : Yes, Verbal Permission Granted ? Share Information with NAME: William Good ?   ? Permission granted to share info w Relationship: brother ? Permission granted to share info w Contact Information: [phone] ? ?Emotional Assessment ?Appearance:: Appears stated age ?Attitude/Demeanor/Rapport: Engaged, Gracious ?Affect (typically observed): Accepting ?Orientation: : Oriented to Self, Oriented to Place, Oriented to  Time, Oriented to Situation ?Alcohol / Substance Use: Not Applicable ?Psych Involvement: No (comment) ? ?Admission diagnosis:  Rectal adenocarcinoma ([place]) [C20] ?Patient Active Problem List  ? Diagnosis Date Noted  ? High output ileostomy ([place]) 05/29/2021  ? Coronary  artery calcification 05/28/2021  ? AKI (acute kidney injury) ([place]) 05/28/2021  ? Bipolar disorder ([place]) 05/27/2021  ? Polycystic kidney 05/27/2021  ? Type 2 diabetes mellitus with other circulatory complications ([place]) 83/25/4982  ? Tobacco abuse 05/27/2021  ?  Rectal adenocarcinoma ([place]) 05/27/2021  ? Preoperative clearance 03/19/2021  ? Type 2 diabetes mellitus with complication, without long-term current use of insulin ([place]) 03/19/2021  ? Gastroesophageal reflux disease 03/17/2021  ? Hyperlipidemia 03/17/2021  ? Benign prostatic hyperplasia with lower urinary tract symptoms 03/17/2021  ? Migraine without William, not refractory 03/17/2021  ? Obesity 03/17/2021  ? Restless legs 03/17/2021  ? Severe recurrent major depression with psychotic features ([place]) 03/17/2021  ? Vitamin D deficiency 03/17/2021  ? Chronic kidney disease, stage 3 unspecified ([place]) 09/24/2020  ? Obstruction of left ureteropelvic junction (UPJ) 09/24/2020  ? Rectal cancer ([place]) 09/16/2020  ? Prolapsed internal hemorrhoids, grade 2 09/16/2020  ? Migraine 09/16/2020  ? Essential hypertension 01/16/2010  ? SHORTNESS OF BREATH 01/16/2010  ? CHEST PAIN 01/16/2010  ? ELECTROCARDIOGRAM, ABNORMAL 01/16/2010  ? ?PCP:  William Dials, MD ?Pharmacy:   ?[facility] [address] ?[place] [address] ?Phone: [phone] Fax: [phone] ? ?[facility] ?[address] ?[place] [address] ?Phone: [phone] Fax: [phone] ? ? ? ? ?Social Determinants of Health (SDOH) Interventions ?  ? ?Readmission Risk Interventions ? ?  05/29/2021  ?  2:21 PM  ?Readmission Risk Prevention Plan  ?Transportation Screening Complete  ?PCP or Specialist Appt within 3-5 Days Complete  ?[place] or Home Care Consult Complete  ?Social Work Consult for [place] Planning/Counseling Complete  ?Palliative Care Screening Not Applicable  ? ? ? ?

## 2021-05-29 NOTE — Progress Notes (Signed)
Occupational Therapy Treatment ?Patient Details ?Name: William Good ?MRN: [id number] ?DOB: [birth year] ?Today's Date: 05/29/2021 ? ? ?History of present illness Pt s/p low anterior rectosigmoid resection, coloanal anastomisis, and diverting loop ileostomy 2* rectal CA.  Pt with CKD, DM, back surgery, R TKR and CKD ?  ?OT comments ? Patient negative for orthostatic vitals this session and does not endorse any dizziness. Patient needing mod A for trunk support to sit upright to edge of bed. With min +2 and bed height elevated patient able to power up to standing, needing cues to push from bed vs pull on walker. Upon standing patient incontinent of urine with RN made aware that dressing around JP drain fell off. Patient states since surgery have difficulty with incontinence "I can't tell when I have to go." Patient needing mod A for lower body ADLs to clean up after incontinent of urine and don clean socks. Due to patient having limited assistance at home, needing increased assistance with self care as well as needing further education on ostomy care updated D/C plan to short term rehab.  ? ?Recommendations for follow up therapy are one component of a multi-disciplinary discharge planning process, led by the attending physician.  Recommendations may be updated based on patient status, additional functional criteria and insurance authorization. ?   ?Follow Up Recommendations ? Skilled nursing-short term rehab (<3 hours/day)  ?  ?Assistance Recommended at Discharge Frequent or constant Supervision/Assistance  ?Patient can return home with the following ? A little help with walking and/or transfers;A lot of help with bathing/dressing/bathroom;Assistance with cooking/housework;Assist for transportation;Help with stairs or ramp for entrance ?  ?Equipment Recommendations ? None recommended by OT  ?  ?   ?Precautions / Restrictions Precautions ?Precautions: Fall ?Precaution Comments: new ileostomy, JP drain on  R ?Restrictions ?Weight Bearing Restrictions: No  ? ? ?  ? ?Mobility Bed Mobility ?Overal bed mobility: Needs Assistance ?Bed Mobility: Supine to Sit ?  ?  ?Supine to sit: Mod assist, HOB elevated ?  ?  ?General bed mobility comments: Patient needing mod A to upright trunk ?  ? ? ?  ?Balance Overall balance assessment: Needs assistance ?Sitting-balance support: Feet supported ?Sitting balance-Leahy Scale: Fair ?  ?  ?Standing balance support: Reliant on assistive device for balance, During functional activity ?Standing balance-Leahy Scale: Poor ?  ?  ?  ?  ?  ?  ?  ?  ?  ?  ?  ?  ?   ? ?ADL either performed or assessed with clinical judgement  ? ?ADL Overall ADL's : Needs assistance/impaired ?  ?  ?  ?  ?  ?  ?Lower Body Bathing: Moderate assistance;Sitting/lateral leans;Sit to/from stand ?Lower Body Bathing Details (indicate cue type and reason): Patient was able to wash peri area in sitting, needs assist to wash feet ?  ?  ?Lower Body Dressing: Moderate assistance;Sitting/lateral leans;Sit to/from stand ?Lower Body Dressing Details (indicate cue type and reason): Patient able to doff socks by bringing foot up towards lap however unable to don socks. ?Toilet Transfer: Minimal assistance;+2 for safety/equipment;Stand-pivot;Cueing for safety;Rolling walker (2 wheels) ?Toilet Transfer Details (indicate cue type and reason): To recliner chair. Min A to power up to standing from elevated bed height. Needs verbal cues to push from bed vs pulling on walker. ?Toileting- Clothing Manipulation and Hygiene: Maximal assistance ?Toileting - Clothing Manipulation Details (indicate cue type and reason): Patient having difficulty with incontinence, upon standing has leakage of urine ?  ?  ?Functional mobility during  ADLs: Minimal assistance;+2 for safety/equipment;Cueing for safety;Rolling walker (2 wheels) ?  ?  ? ? ? ?Cognition Arousal/Alertness: Awake/alert ?Behavior During Therapy: [facility] for tasks assessed/performed ?Overall  Cognitive Status: No family/caregiver present to determine baseline cognitive functioning ?  ?  ?  ?  ?  ?  ?  ?  ?  ?  ?  ?  ?  ?  ?  ?  ?General Comments: Wound care just left prior to session, when asked how he is feeling about ostomy care patient states "I don't know" ?  ?  ?   ?   ?   ?   ? ? ?Pertinent Vitals/ Pain       Pain Assessment ?Pain Assessment: Faces ?Faces Pain Scale: Hurts even more ?Pain Location: abdomen ?Pain Descriptors / Indicators: Guarding, Sore ?Pain Intervention(s): Monitored during session ? ?   ?   ? ?Frequency ? Min 2X/week  ? ? ? ? ?  ?Progress Toward Goals ? ?OT Goals(current goals can now be found in the care plan section) ? Progress towards OT goals: Progressing toward goals ? ?Acute Rehab OT Goals ?Patient Stated Goal: Go to rehab ?OT Goal Formulation: With patient ?Time For Goal Achievement: 06/11/21 ?Potential to Achieve Goals: Good ?ADL Goals ?Pt Will Perform Lower Body Dressing: with modified independence;sit to/from stand ?Pt Will Transfer to Toilet: with supervision;ambulating;regular height toilet;grab bars ?Pt Will Perform Toileting - Clothing Manipulation and hygiene: with supervision;sit to/from stand ?Additional ADL Goal #1: Patient will stand at sink to perform grooming task as evidence of improving activity tolerance  ?Plan Discharge plan needs to be updated   ? ?Co-evaluation ? ? ? PT/OT/SLP Co-Evaluation/Treatment: Yes ?Reason for Co-Treatment: For patient/therapist safety;To address functional/ADL transfers ?PT goals addressed during session: Mobility/safety with mobility ?OT goals addressed during session: ADL's and self-care ?  ? ?  ?AM-PAC OT "6 Clicks" Daily Activity     ?Outcome Measure ? ? Help from another person eating meals?: A Little ?Help from another person taking care of personal grooming?: A Little ?Help from another person toileting, which includes using toliet, bedpan, or urinal?: A Lot ?Help from another person bathing (including washing, rinsing,  drying)?: A Lot ?Help from another person to put on and taking off regular upper body clothing?: A Little ?Help from another person to put on and taking off regular lower body clothing?: A Lot ?6 Click Score: 15 ? ?  ?End of Session Equipment Utilized During Treatment: Rolling walker (2 wheels) ? ?OT Visit Diagnosis: Muscle weakness (generalized) (M62.81) ?  ?Activity Tolerance Patient tolerated treatment well ?  ?Patient Left in chair;with call bell/phone within reach ?  ?Nurse Communication Mobility status;Other (comment) (incontinent of urine) ?  ? ?   ? ?Time: 8657-8469 ?OT Time Calculation (min): 31 min ? ?Charges: OT General Charges ?$OT Visit: 1 Visit ?OT Treatments ?$Self Care/Home Management : 8-22 mins ? ?Delbert Phenix OT ?OT pager: [phone] ? ? ?Rosemary Holms ?05/29/2021, 2:34 PM ?

## 2021-05-29 NOTE — Progress Notes (Signed)
Physical Therapy Treatment ?Patient Details ?Name: William Good ?MRN: [id number] ?DOB: May 02, 1955 ?Today's Date: 05/29/2021 ? ? ?History of Present Illness Pt s/p low anterior rectosigmoid resection, coloanal anastomisis, and diverting loop ileostomy 2* rectal CA.  Pt with CKD, DM, back surgery, R TKR and CKD ? ?  ?PT Comments  ? ? Patient negative for orthostatic vitals this session and does not endorse any dizziness. Patient needing mod A for trunk support to sit upright to edge of bed. With min +2 and bed height elevated patient able to power up to standing, needing cues to push from bed vs pull on walker. Upon standing patient incontinent of urine with RN made aware that dressing around JP drain fell off. Patient states since surgery have difficulty with incontinence "I can't tell when I have to go."  Pt tolerated ambulating short distance in room and up to chair.  Due to patient having limited assistance at home, needing increased assistance with self care as well as needing further education on ostomy care updated D/C plan to short term rehab  ?Recommendations for follow up therapy are one component of a multi-disciplinary discharge planning process, led by the attending physician.  Recommendations may be updated based on patient status, additional functional criteria and insurance authorization. ? ?Follow Up Recommendations ? Skilled nursing-short term rehab (<3 hours/day) ?  ?  ?Assistance Recommended at Discharge Frequent or constant Supervision/Assistance  ?Patient can return home with the following A lot of help with walking and/or transfers;A lot of help with bathing/dressing/bathroom;Assistance with cooking/housework;Assist for transportation;Help with stairs or ramp for entrance ?  ?Equipment Recommendations ? None recommended by PT  ?  ?Recommendations for Other Services   ? ? ?  ?Precautions / Restrictions Precautions ?Precautions: Fall ?Precaution Comments: new ileostomy, JP drain on  R ?Restrictions ?Weight Bearing Restrictions: No  ?  ? ?Mobility ? Bed Mobility ?Overal bed mobility: Needs Assistance ?Bed Mobility: Supine to Sit ?Rolling: Min assist ?Sidelying to sit: Min assist, Mod assist ?Supine to sit: Mod assist, HOB elevated ?  ?  ?General bed mobility comments: Patient needing mod A to upright trunk ?  ? ?Transfers ?Overall transfer level: Needs assistance ?Equipment used: Rolling walker (2 wheels) ?Transfers: Sit to/from Stand ?Sit to Stand: Min assist, +2 physical assistance, +2 safety/equipment, From elevated surface ?  ?  ?  ?  ?  ?General transfer comment: cues for use of UEs to self assist.  Physical assist to bring wt up and fwd and to balance in standing ?  ? ?Ambulation/Gait ?Ambulation/Gait assistance: Min assist, +2 physical assistance, +2 safety/equipment ?Gait Distance (Feet): 3 Feet ?Assistive device: Rolling walker (2 wheels) ?Gait Pattern/deviations: Step-to pattern, Decreased step length - right, Decreased step length - left, Shuffle ?Gait velocity: decr ?  ?  ?General Gait Details: Pt ambulated short distance from bed to chair only ? ? ?Stairs ?  ?  ?  ?  ?  ? ? ?Wheelchair Mobility ?  ? ?Modified Rankin (Stroke Patients Only) ?  ? ? ?  ?Balance Overall balance assessment: Needs assistance ?Sitting-balance support: Feet supported ?Sitting balance-Leahy Scale: Fair ?  ?  ?Standing balance support: Reliant on assistive device for balance, During functional activity ?Standing balance-Leahy Scale: Poor ?  ?  ?  ?  ?  ?  ?  ?  ?  ?  ?  ?  ?  ? ?  ?Cognition Arousal/Alertness: Awake/alert ?Behavior During Therapy: [facility] for tasks assessed/performed ?Overall Cognitive Status: No family/caregiver present to  determine baseline cognitive functioning ?  ?  ?  ?  ?  ?  ?  ?  ?  ?  ?  ?  ?  ?  ?  ?  ?General Comments: Wound care just left prior to session, when asked how he is feeling about ostomy care patient states "I don't know" ?  ?  ? ?  ?Exercises   ? ?  ?General Comments   ?  ?   ? ?Pertinent Vitals/Pain Pain Assessment ?Pain Assessment: Faces ?Faces Pain Scale: Hurts even more ?Pain Location: abdomen ?Pain Descriptors / Indicators: Guarding, Sore ?Pain Intervention(s): Limited activity within patient's tolerance, Monitored during session, Premedicated before session, Ice applied  ? ? ?Home Living   ?  ?  ?  ?  ?  ?  ?  ?  ?  ?   ?  ?Prior Function    ?  ?  ?   ? ?PT Goals (current goals can now be found in the care plan section) Acute Rehab PT Goals ?Patient Stated Goal: Regain IND ?PT Goal Formulation: With patient ?Time For Goal Achievement: 06/11/21 ?Potential to Achieve Goals: Good ?Progress towards PT goals: Progressing toward goals ? ?  ?Frequency ? ? ? Min 3X/week ? ? ? ?  ?PT Plan Current plan remains appropriate  ? ? ?Co-evaluation PT/OT/SLP Co-Evaluation/Treatment: Yes ?Reason for Co-Treatment: For patient/therapist safety ?PT goals addressed during session: Mobility/safety with mobility ?OT goals addressed during session: ADL's and self-care ?  ? ?  ?AM-PAC PT "6 Clicks" Mobility   ?Outcome Measure ? Help needed turning from your back to your side while in a flat bed without using bedrails?: A Little ?Help needed moving from lying on your back to sitting on the side of a flat bed without using bedrails?: A Lot ?Help needed moving to and from a bed to a chair (including a wheelchair)?: A Lot ?Help needed standing up from a chair using your arms (e.g., wheelchair or bedside chair)?: A Lot ?Help needed to walk in hospital room?: Total ?Help needed climbing 3-5 steps with a railing? : Total ?6 Click Score: 11 ? ?  ?End of Session   ?Activity Tolerance: Patient limited by fatigue;Other (comment) ?Patient left: in chair;with call bell/phone within reach;with chair alarm set;with family/visitor present ?Nurse Communication: Mobility status ?PT Visit Diagnosis: Unsteadiness on feet (R26.81);History of falling (Z91.81);Difficulty in walking, not elsewhere classified (R26.2);Pain ?   ? ? ?Time: 2035-5974 ?PT Time Calculation (min) (ACUTE ONLY): 25 min ? ?Charges:  $Therapeutic Activity: 8-22 mins          ?          ? ?Debe Coder PT ?Acute Rehabilitation Services ?Pager [phone] ?Office [phone] ? ? ? ?Vallen Calabrese ?05/29/2021, 3:02 PM ? ?

## 2021-05-29 NOTE — Progress Notes (Signed)
[facility] ?CONSULT PROGRESS NOTE ? ? ? ?Progress Note  ?William Good  UGQ:[id number] DOB: 1955/05/18 DOA: 05/27/2021 ?PCP: Aura Dials, MD  ? ? ? ?Brief Narrative:  ? ?William Good is an 66 y.o. male past medical history of chronic back pain, bipolar disorder, stage II chronic kidney disease and diabetes mellitus type 2, history of rectal cancer underwent robotic anterior rectosigmoid resection with very low coloanal anastomosis and diverting colostomy tube were evaluating for uncontrolled diabetes mellitus and chronic kidney disease ? ? ?Assessment/Plan:  ? ?Rectal cancer [facility]): ?Underwent surgery yesterday continue postop management per general surgery Dr. Johney Maine. ? ?Acute kidney injury superimposed on polycystic kidney disease/chronic kidney disease stage II: ?With a baseline creatinine 1.0-1.2 ?He has not received any contrast I do not see no episodes of hypotension, he did underwent a recent surgical procedure, at home he was on diuretic and ACE inhibitor in the setting of polycystic kidney disease. ?ACE inhibitor was discontinued was started on IV fluids. ?Follow strict I's and O's and daily weights. ?Avoid hypotension. ?Creatinine has stabilized at 1.7-1.5, will continue IV fluids for today recheck a basic metabolic panel for tomorrow. ?Check a renal ultrasound and renal ultrasound, his urine appears to be very concentrated question if there is hematuria. ? ?Essential hypertension: ?Continue propanolol. ?Holding ACE inhibitor and diuretic therapy. ?Agree with IV metoprolol, avoid ACE inhibitor IV or oral. ?We will allow a degree of permissive hypertension ? ?Migraine: ?Continue propranolol sumatriptan and other analgesics. ? ?Diabetes mellitus type 2 with long-term use of insulin: ?With an A1c of 7.3. ?Continue carb modified diet, currently on sliding scale blood glucose fairly controlled. ?Add meal coverage. ? ?Bipolar disorder: ?Continue mirtazapine, risperidone and  alprazolam. ? ?CAD: ?Continue aspirin, statins and beta-blockers. ? ? ?DVT prophylaxis: lovenox ?Family Communication:none ? ? ? ?Code Status:  ? ?  ?Code Status Orders  ?(From admission, onward)  ?  ? ? ?  ? ?  Start     Ordered  ? 05/27/21 1540  Full code  Continuous       ? 05/27/21 1539  ? ?  ?  ? ?  ? ?Code Status History   ? ? This patient has a current code status but no historical code status.  ? ?  ? ? ? ? ?IV Access:  ? ?Peripheral IV ? ? ?Procedures and diagnostic studies:  ? ?No results found. ? ? ?Medical Consultants:  ? ?None. ? ? ?Subjective:  ? ? ?William Good relates his pain is controlled by his back pain is not controlled, tolerating his diet. ? ?Objective:  ? ? ?Vitals:  ? 05/28/21 1342 05/28/21 1837 05/28/21 2049 05/29/21 0553  ?BP: 115/75 127/82 125/78 128/81  ?Pulse: 69 76 74 76  ?Resp: '15  18 18  '$ ?Temp: (!) 97.5 ?F (36.4 ?C) (!) 97.5 ?F (36.4 ?C) 97.9 ?F (36.6 ?C) 98.2 ?F (36.8 ?C)  ?TempSrc: Oral Oral Oral Oral  ?SpO2: 97% 96% 97% 94%  ?Weight:      ?Height:      ? ?SpO2: 94 % ?O2 Flow Rate (L/min): 2 L/min ? ? ?Intake/Output Summary (Last 24 hours) at 05/29/2021 0732 ?Last data filed at 05/29/2021 0600 ?Gross per 24 hour  ?Intake 2551.68 ml  ?Output 2105 ml  ?Net 446.68 ml  ? ?Filed Weights  ? 05/27/21 0659 05/27/21 1536 05/28/21 0537  ?Weight: 108.9 kg 108.9 kg 106.1 kg  ? ? ?Exam: ?General exam: In no acute distress. ?Respiratory system: Good air movement and clear  to auscultation. ?Cardiovascular system: S1 & S2 heard, RRR. No JVD. ?Gastrointestinal system: Abdomen is nondistended, soft and nontender.  ?Extremities: No pedal edema. ?Skin: No rashes, lesions or ulcers ?Psychiatry: Judgement and insight appear normal. Mood & affect appropriate.  ? ? ?Data Reviewed:  ? ? ?Labs: ?Basic Metabolic Panel: ?Recent Labs  ?Lab 05/28/21 ?0400 05/29/21 ?0312  ?NA 134* 133*  ?K 4.4 4.1  ?CL 104 104  ?CO2 23 23  ?GLUCOSE 193* 179*  ?BUN 28* 30*  ?CREATININE 1.74* 1.95*  ?CALCIUM 7.8* 7.6*  ?MG  1.5*  --   ?PHOS  --  4.8*  ? ?GFR ?Estimated Creatinine Clearance: 46.9 mL/min (A) (by C-G formula based on SCr of 1.95 mg/dL (H)). ?Liver Function Tests: ?Recent Labs  ?Lab 05/28/21 ?0400 05/29/21 ?0312  ?AST 43*  --   ?ALT 39  --   ?ALKPHOS 69  --   ?BILITOT 0.4  --   ?PROT 5.8*  --   ?ALBUMIN 3.1* 3.3*  ? ?No results for input(s): LIPASE, AMYLASE in the last 168 hours. ?No results for input(s): AMMONIA in the last 168 hours. ?Coagulation profile ?No results for input(s): INR, PROTIME in the last 168 hours. ?COVID-19 Labs ? ?No results for input(s): DDIMER, FERRITIN, LDH, CRP in the last 72 hours. ? ?No results found for: SARSCOV2NAA ? ?CBC: ?Recent Labs  ?Lab 05/28/21 ?0400 05/29/21 ?0312  ?WBC 9.7  --   ?HGB 13.5 12.4*  ?HCT 38.8*  --   ?MCV 97.5  --   ?PLT 124*  --   ? ?Cardiac Enzymes: ?No results for input(s): CKTOTAL, CKMB, CKMBINDEX, TROPONINI in the last 168 hours. ?BNP (last 3 results) ?No results for input(s): PROBNP in the last 8760 hours. ?CBG: ?Recent Labs  ?Lab 05/28/21 ?0748 05/28/21 ?1154 05/28/21 ?1654 05/28/21 ?1959 05/28/21 ?2145  ?GLUCAP 152* 185* 179* 202* 195*  ? ?D-Dimer: ?No results for input(s): DDIMER in the last 72 hours. ?Hgb A1c: ?No results for input(s): HGBA1C in the last 72 hours. ?Lipid Profile: ?No results for input(s): CHOL, HDL, LDLCALC, TRIG, CHOLHDL, LDLDIRECT in the last 72 hours. ?Thyroid function studies: ?No results for input(s): TSH, T4TOTAL, T3FREE, THYROIDAB in the last 72 hours. ? ?Invalid input(s): FREET3 ?Anemia work up: ?No results for input(s): VITAMINB12, FOLATE, FERRITIN, TIBC, IRON, RETICCTPCT in the last 72 hours. ?Sepsis Labs: ?Recent Labs  ?Lab 05/28/21 ?0400  ?WBC 9.7  ? ?Microbiology ?No results found for this or any previous visit (from the past 240 hour(s)). ? ? ?Medications:  ? ? acetaminophen  1,000 mg Oral Q6H  ? ALPRAZolam  1 mg Oral BID  ? alvimopan  12 mg Oral BID  ? aspirin EC  81 mg Oral Daily  ? Chlorhexidine Gluconate Cloth  6 each Topical Once   ? cholecalciferol  2,000 Units Oral Daily  ? enoxaparin (LOVENOX) injection  40 mg Subcutaneous Q24H  ? feeding supplement (GLUCERNA SHAKE)  237 mL Oral BID BM  ? insulin aspart  0-15 Units Subcutaneous TID WC  ? insulin aspart  0-5 Units Subcutaneous QHS  ? lip balm  1 application. Topical BID  ? mirtazapine  45 mg Oral QHS  ? pantoprazole  40 mg Oral Q1200  ? polycarbophil  625 mg Oral BID  ? potassium chloride SA  20 mEq Oral Daily  ? pravastatin  40 mg Oral Daily  ? propranolol ER  160 mg Oral QHS  ? risperiDONE  0.25 mg Oral QHS  ? sodium chloride flush  10-40 mL Intracatheter  Q12H  ? ?Continuous Infusions: ? lactated ringers    ? lactated ringers 100 mL/hr at 05/29/21 0047  ? methocarbamol (ROBAXIN) IV    ? ? ? ? LOS: 2 days  ? ?Tammi Klippel Aileen Fass  ?[facility] ? ?05/29/2021, 7:32 AM  ? ? ? ? ? ? ?

## 2021-05-29 NOTE — Consult Note (Signed)
[place] Nurse ostomy follow up ?Stoma type/location: RLQ, loop ileostomy  ?Stomal assessment/size: oblong 2 1/4" x 1 5/8" proximal os (functional) distal os opening at 6 o'clock; pink; blood clot along medial aspect of stoma  ?Peristomal assessment: intact ?Treatment options for stomal/peristomal skin: 2" skin barrier ring  ?Output high output ileostomy; 300cc liquid green/brown stool; active during pouch change  ?Ostomy pouching: 2pc. 2 3/4" with 2" skin barrier ring  ?Education provided: demonstrated pouch change with patient.  ?Explained role of ostomy nurse and creation of stoma  ?Explained stoma characteristics (budded, flush, color, texture, care) ?Demonstrated pouch change (cutting new skin barrier, measuring stoma, cleaning peristomal skin and stoma, use of barrier ring) ?Education on emptying when 1/3 to 1/2 full and how to empty ?Demonstrated "burping" flatus from pouch ?Demonstrated use of wick to clean spout at visit yesterday  ?Patient is going to need  support, high output, active stoma. Reinforced need to empty when 1/2  or less full, pouch was full of stool and tight with gas when I arrived. Patient reminded to tell staff when he sees that it is full  ? ?  ?Enrolled patient in [place] Start Discharge program: Yes ? ?[place] Nurse will follow along with you for continued support with ostomy teaching and care ?Heela Heishman Liane Comber MSN, RN, [place], [place], [place] ?[phone]  ?

## 2021-05-29 NOTE — Progress Notes (Signed)
Nutrition Education Note ? ?RD consulted for nutrition education regarding ileostomy. ? ?RD provided "Ileostomy Nutrition Therapy" handout from the Academy of [employer] Dietetics.  Advised patient on ways to reduce gas output such as eating meals and snacks at the same times everyday, avoiding acidic, fried, sugary, fatty, and greasy foods. Advised patient to avoid straws, carbonated beverages, chewing gum or tobacco, eating too fast, and skipping meals. Provided patient with a list of gas and odor producing foods and advised patient to reintroduce fiber slowly. Recommended patient drink plenty of water and made patient aware of the increased risk of dehydration. Advised patient to chew foods well and to use chewable multivitamins for better absorption.  ? ?Teach back method used. ? ?Expect fair compliance. Reviewed handout with patient, staff entered room to provide patient care. Therapies to see patient following visit as well.  ? ?Body mass index is 31.72 kg/m?[name] Pt meets criteria for obesity based on current BMI. ? ?Current diet order is soft, Patient has not eaten yet today. States he has consumed water mostly. Labs and medications reviewed. No further nutrition interventions warranted at this time. If additional nutrition issues arise, please re-consult RD. ? ?Clayton Bibles, MS, RD, LDN ?Inpatient Clinical Dietitian ?Contact information available via Amion ?  ?

## 2021-05-29 NOTE — Progress Notes (Signed)
? ?BRENNEN CAMPER ?[id number] ?01/03/1956 ? ?CARE TEAM: ? ?PCP: Aura Dials, MD ? ?Outpatient Care Team: Patient Care Team: ?Aura Dials, MD as PCP - General (Family Medicine) ?Michael Boston, MD as Consulting Physician (General Surgery) ?Otis Brace, MD as Consulting Physician (Gastroenterology) ?Ladell Pier, MD as Consulting Physician (Oncology) ?Kyung Rudd, MD as Consulting Physician (Radiation Oncology) ?Earl Gala, Deliah Goody, RN as Equities trader (Oncology) ?Larey Dresser, MD as Consulting Physician (Cardiology) ?Lucas Mallow, MD as Consulting Physician (Urology) ? ?Inpatient Treatment Team: Treatment Team: Attending Provider: Michael Boston, MD; [place] Nurse: Illene Regulus, RN; Consulting Physician: Ladell Pier, MD; Physical Therapist: Mathis Fare, PT; Consulting Physician: Reubin Milan, MD; Rounding Team: Garner Gavel, MD; Consulting Physician: Charlynne Cousins, MD; Occupational Therapist: Rosemary Holms, OT; Utilization Review: Fortino Sic, RN; Pharmacist: Suzzanne Cloud, [facility]; Student Nurse: Cherie Dark, Nikki Dom, Student-RN; Charge Nurse: Steward Ros, RN ? ? ?Problem List:  ? ?Principal Problem: ?  Rectal cancer ([place]) ?Active Problems: ?  Essential hypertension ?  Migraine ?  Chronic kidney disease, stage 3 unspecified ([place]) ?  Type 2 diabetes mellitus with complication, without long-term current use of insulin ([place]) ?  Bipolar disorder ([place]) ?  Polycystic kidney ?  Coronary artery calcification ?  AKI (acute kidney injury) ([place]) ? ? ?2 Days Post-Op  05/27/2021 ? ?POST-OPERATIVE DIAGNOSIS:  DISTAL RECTAL CANCER ?  ?PROCEDURE:   ?-ROBOTIC LOW ANTERIOR RECTOSIGMOID RESECTION ?-VERY LOW COLOANAL ANASTOMOSIS ?-DIVERTING LOOP ILEOSTOMY ?-INTRAOPERATIVE ASSESSMENT OF TISSUE VASCULAR PERFUSION USING ICG (indocyanine green) IMMUNOFLUORESCENCE ?-TRANSVERSUS ABDOMINIS PLANE (TAP) BLOCK - BILATERAL ?-RIGID PROCTOSCOPY ?  ?SURGEON:  Adin Hector,  MD ? ?OR FINDINGS:  ?  ?Patient had fibrous scarring in the distal rectum 4-7 cm from the anal verge.  Some thin stricturing.  Consistent with pretty good pathological response compared to the very bulky tumor at first.  Very fibrotic and narrow pelvis with bulky mesorectum. ?  ?No obvious metastatic disease on visceral parietal peritoneum or liver. ?  ?The anastomosis rests 2 cm from the anal verge by rigid proctoscopy.  There is a 31 EEA mid descending colon to anal rectal margin stump ? ?###################################### ? ?Preoperative diagnosis:  ?1. Ureteral identification for pelvic surgery ?  ?Postoperative diagnosis: ?1. same ?  ?Procedure(s): ?1. Cystoscopy with bilateral ureteral catheterization for firefly injection ?  ?Surgeon: Dr. Donald Pore ?  ? ? ? ?Assessment ? ?Recovering ? ?(Hospital Stay = 2 days) ? ?Plan: ? ?ERAS enhance recovery pathway ? ?Increased creatinine in the setting of chronic kidney disease.  Transition to intermittent IV fluid boluses.  Suspect he would benefit from 6 weeks of intermittent IV fluid boluses q. Monday Wednesday Friday through the Port-A-Cath to avoid dehydration. ? ?High output from ileostomy.  Fiber/iron/Imodium twice daily to help thicken and control.  As needed Imodium to help slow down.  See if we can thicken things up.  Encourage p.o. fluids ? ?Diabetic control.  Sliding scale insulin started .  We will ask medicine to have input to help start regimen since he is not on anything.  Called [facility] - Dr Olevia Bowens most likely the one to help see & offer recs  ? ?Follow-up on pathology. ? ?Patient has small anastomotic leak controlled with drain and diversion.  Drain looks serosanguineous today compared to slightly thinly feculent yesterday = probably already sealing up.   Hopeful sign.  Follow.  Keep drain for several weeks. ? ?Foley out per protocol. ? ?Ileostomy  care and training. ? ?Have involvement with physical, occupational therapies.  Also case management since  patient apparently lives at home but is borderline functioning/independent and brother most likely does not feel comfortable managing patient by himself.   ? ?Seems to be rather deconditioned.  I believe he will require a SNF.   ? ?-VTE prophylaxis- SCDs, etc ? ?-mobilize as tolerated to help recovery.  Patient feeling a little wiped out.  Keep trying to mobilize as tolerated. ? ?Disposition:  ?Disposition:  ?The patient is from: Home ? ?Anticipate discharge to:  [facility] (SNF) ? ?Anticipated Date of Discharge is:  March 27,2023 ?  ? ?Barriers to discharge:  Pending Clinical improvement (more likely than not) ? ?Patient currently is NOT MEDICALLY STABLE for discharge from the hospital from a surgery standpoint. ? ? ? ? ? ?I reviewed nursing notes, last 24 h vitals and pain scores, last 48 h intake and output, last 24 h labs and trends, and last 24 h imaging results. I have reviewed this patient's available data, including medical history, events of note, test results, etc as part of my evaluation.  A significant portion of that time was spent in counseling.  Care during the described time interval was provided by me. ? ?This care required moderate level of medical decision making.  05/29/2021 ? ? ? ?Subjective: ?(Chief complaint) ? ?Patient tolerating liquids. ? ?Felt very weak trying to get up and move around. ? ?Denies any abdominal pain. ? ?Ileostomy pouring out ? ?Objective: ? ?Vital signs: ? ?Vitals:  ? 05/28/21 1342 05/28/21 1837 05/28/21 2049 05/29/21 0553  ?BP: 115/75 127/82 125/78 128/81  ?Pulse: 69 76 74 76  ?Resp: '15  18 18  '$ ?Temp: (!) 97.5 ?F (36.4 ?C) (!) 97.5 ?F (36.4 ?C) 97.9 ?F (36.6 ?C) 98.2 ?F (36.8 ?C)  ?TempSrc: Oral Oral Oral Oral  ?SpO2: 97% 96% 97% 94%  ?Weight:      ?Height:      ? ? ?Last BM Date : 05/26/21 ? ?Intake/Output  ? ?Yesterday: ? 03/23 0701 - 03/24 0700 ?In: 2551.7 [P.O.:480; I.V.:1923.1; IV Piggyback:148.6] ?Out: 2105 [Urine:650; Drains:130; DJTTS:1779] ?This  shift: ? No intake/output data recorded. ? ?Bowel function: ? Flatus: YES ? BM:  YES ? Drain: Mostly serous but slight brown tinge ? ? ?Physical Exam: ? ?General: Pt awake/alert in no acute distress ?Eyes: PERRL, normal EOM.  Sclera clear.  No icterus ?Neuro: CN II-XII intact w/o focal sensory/motor deficits. ?Lymph: No head/neck/groin lymphadenopathy ?Psych:  No delerium/psychosis/paranoia.  Oriented x 4 ?HENT: Normocephalic, Mucus membranes moist.  No thrush ?Neck: Supple, No tracheal deviation.  No obvious thyromegaly ?Chest: No pain to chest wall compression.  Good respiratory excursion.  No audible wheezing ?CV:  Pulses intact.  Regular rhythm.  No major extremity edema ?MS: Normal AROM mjr joints.  No obvious deformity ? ?Abdomen: Soft.  Mildy distended.  Nontender.  No evidence of peritonitis.  No incarcerated hernias. ?Ileostomy right abdomen pink and edematous -gas and thin dark brown/green thin effluent in bag..   ? ?Ext:   No deformity.  No mjr edema.  No cyanosis ?Skin: No petechiae / purpurea.  No major sores.  Warm and dry ? ? ? ?Results:  ? ?Cultures: ?No results found for this or any previous visit (from the past 720 hour(s)). ? ?Labs: ?Results for orders placed or performed during the hospital encounter of 05/27/21 (from the past 48 hour(s))  ?Glucose, capillary     Status: Abnormal  ? Collection  Time: 05/27/21  2:18 PM  ?Result Value Ref Range  ? Glucose-Capillary 257 (H) 70 - 99 mg/dL  ?  Comment: Glucose reference range applies only to samples taken after fasting for at least 8 hours.  ?Glucose, capillary     Status: Abnormal  ? Collection Time: 05/27/21  5:35 PM  ?Result Value Ref Range  ? Glucose-Capillary 299 (H) 70 - 99 mg/dL  ?  Comment: Glucose reference range applies only to samples taken after fasting for at least 8 hours.  ?Glucose, capillary     Status: Abnormal  ? Collection Time: 05/27/21  9:50 PM  ?Result Value Ref Range  ? Glucose-Capillary 255 (H) 70 - 99 mg/dL  ?  Comment: Glucose  reference range applies only to samples taken after fasting for at least 8 hours.  ?Basic metabolic panel     Status: Abnormal  ? Collection Time: 05/28/21  4:00 AM  ?Result Value Ref Range  ? Sodium 134 (L)

## 2021-05-29 NOTE — [place] FL2 (Signed)
?[place] MEDICAID FL2 LEVEL OF CARE SCREENING TOOL  ?  ? ?IDENTIFICATION  ?Patient Name: ?William Good Birthdate: 12-Jul-1955 Sex: male Admission Date (Current Location): ?05/27/2021  ?[place] and [place] Number: ? [place] ?  Facility and Address:  ?[facility],  [facility], [place] ?     Provider Number: ?[id number]  ?Attending Physician Name and Address:  ?Michael Boston, MD ? Relative Name and Phone Number:  ?brother, Burhanuddin Kohlmann [phone] ?   ?Current Level of Care: ?Hospital Recommended Level of Care: ?[place] Prior Approval Number: ?  ? ?Date Approved/Denied: ?  PASRR Number: ?  ? ?Discharge Plan: ?SNF ?  ? ?Current Diagnoses: ?Patient Active Problem List  ? Diagnosis Date Noted  ? High output ileostomy ([place]) 05/29/2021  ? Coronary artery calcification 05/28/2021  ? AKI (acute kidney injury) ([place]) 05/28/2021  ? Bipolar disorder ([place]) 05/27/2021  ? Polycystic kidney 05/27/2021  ? Type 2 diabetes mellitus with other circulatory complications ([place]) 71/08/2692  ? Tobacco abuse 05/27/2021  ? Rectal adenocarcinoma ([place]) 05/27/2021  ? Preoperative clearance 03/19/2021  ? Type 2 diabetes mellitus with complication, without long-term current use of insulin ([place]) 03/19/2021  ? Gastroesophageal reflux disease 03/17/2021  ? Hyperlipidemia 03/17/2021  ? Benign prostatic hyperplasia with lower urinary tract symptoms 03/17/2021  ? Migraine without aura, not refractory 03/17/2021  ? Obesity 03/17/2021  ? Restless legs 03/17/2021  ? Severe recurrent major depression with psychotic features ([place]) 03/17/2021  ? Vitamin D deficiency 03/17/2021  ? Chronic kidney disease, stage 3 unspecified ([place]) 09/24/2020  ? Obstruction of left ureteropelvic junction (UPJ) 09/24/2020  ? Rectal cancer ([place]) 09/16/2020  ? Prolapsed internal hemorrhoids, grade 2 09/16/2020  ? Migraine 09/16/2020  ? Essential hypertension 01/16/2010  ? SHORTNESS OF BREATH 01/16/2010  ? CHEST PAIN 01/16/2010   ? ELECTROCARDIOGRAM, ABNORMAL 01/16/2010  ? ? ?Orientation RESPIRATION BLADDER Height & Weight   ?  ?Self, Time, Situation, Place ? O2 Continent, Indwelling catheter Weight: 233 lb 14.5 oz (106.1 kg) ?Height:  6' (182.9 cm)  ?BEHAVIORAL SYMPTOMS/MOOD NEUROLOGICAL BOWEL NUTRITION STATUS  ?    Colostomy    ?AMBULATORY STATUS COMMUNICATION OF NEEDS Skin   ?Limited Assist Verbally Surgical wounds (new colostomy) ?  ?  ?  ?    ?     ?     ? ? ?Personal Care Assistance Level of Assistance  ?Bathing, Dressing Bathing Assistance: Limited assistance ?  ?Dressing Assistance: Limited assistance ?   ? ?Functional Limitations Info  ?    ?  ?   ? ? ?SPECIAL CARE FACTORS FREQUENCY  ?PT (By licensed PT), OT (By licensed OT)   ?  ?PT Frequency: 5x/wk ?OT Frequency: 5x/wk ?  ?  ?  ?   ? ? ?Contractures Contractures Info: Not present  ? ? ?Additional Factors Info  ?Code Status, Allergies, Insulin Sliding Scale, Psychotropic Code Status Info: Full ?Allergies Info: NKDA ?Psychotropic Info: see MAR ?Insulin Sliding Scale Info: see MAR ?  ?   ? ?Current Medications (05/29/2021):  This is the current hospital active medication list ?Current Facility-Administered Medications  ?Medication Dose Route Frequency Provider Last Rate Last Admin  ? acetaminophen (TYLENOL) tablet 1,000 mg  1,000 mg Oral Q6H Michael Boston, MD   1,000 mg at 05/29/21 1234  ? albuterol (VENTOLIN HFA) 108 (90 Base) MCG/ACT inhaler 2 puff  2 puff Inhalation Q6H PRN Michael Boston, MD      ? ALPRAZolam Duanne Moron) tablet 1 mg  1  mg Oral BID Michael Boston, MD   1 mg at 05/29/21 0944  ? alum & mag hydroxide-simeth (MAALOX/MYLANTA) 200-200-20 MG/5ML suspension 30 mL  30 mL Oral Q6H PRN Michael Boston, MD      ? alvimopan (ENTEREG) capsule 12 mg  12 mg Oral BID Michael Boston, MD   12 mg at 05/28/21 2143  ? aspirin EC tablet 81 mg  81 mg Oral Daily Michael Boston, MD   81 mg at 05/29/21 0946  ? Chlorhexidine Gluconate Cloth 2 % PADS 6 each  6 each Topical Once Michael Boston, MD      ?  cholecalciferol (VITAMIN D) tablet 2,000 Units  2,000 Units Oral Daily Michael Boston, MD   2,000 Units at 05/29/21 0945  ? diphenhydrAMINE (BENADRYL) 12.5 MG/5ML elixir 12.5 mg  12.5 mg Oral Q6H PRN Michael Boston, MD      ? Or  ? diphenhydrAMINE (BENADRYL) injection 12.5 mg  12.5 mg Intravenous Q6H PRN Michael Boston, MD      ? enoxaparin (LOVENOX) injection 40 mg  40 mg Subcutaneous Q24H Michael Boston, MD   40 mg at 05/29/21 5993  ? feeding supplement (GLUCERNA SHAKE) (GLUCERNA SHAKE) liquid 237 mL  237 mL Oral BID BM Michael Boston, MD   237 mL at 05/29/21 1410  ? ferrous sulfate tablet 325 mg  325 mg Oral BID WC Michael Boston, MD   325 mg at 05/29/21 5701  ? HYDROmorphone (DILAUDID) injection 0.5-2 mg  0.5-2 mg Intravenous Q4H PRN Michael Boston, MD   1 mg at 05/29/21 0446  ? insulin aspart (novoLOG) injection 0-15 Units  0-15 Units Subcutaneous TID WC Michael Boston, MD   3 Units at 05/29/21 1234  ? insulin aspart (novoLOG) injection 0-5 Units  0-5 Units Subcutaneous Ardeen Fillers, MD   3 Units at 05/27/21 2218  ? insulin aspart (novoLOG) injection 2 Units  2 Units Subcutaneous TID WC Charlynne Cousins, MD      ? lactated ringers bolus 1,000 mL  1,000 mL Intravenous Q8H PRN Michael Boston, MD      ? lactated ringers bolus 1,000 mL  1,000 mL Intravenous Q8H PRN Michael Boston, MD      ? lactated ringers infusion   Intravenous Continuous Charlynne Cousins, MD      ? lip balm (CARMEX) ointment 1 application.  1 application. Topical BID Michael Boston, MD   1 application. at 05/29/21 0951  ? loperamide (IMODIUM) capsule 2 mg  2 mg Oral BID Michael Boston, MD   2 mg at 05/29/21 7793  ? loperamide (IMODIUM) capsule 2-4 mg  2-4 mg Oral Q6H PRN Michael Boston, MD      ? magic mouthwash  15 mL Oral QID PRN Michael Boston, MD      ? melatonin tablet 3 mg  3 mg Oral QHS PRN Michael Boston, MD      ? methocarbamol (ROBAXIN) 1,000 mg in dextrose 5 % 100 mL IVPB  1,000 mg Intravenous Q6H PRN Michael Boston, MD      ?  methocarbamol (ROBAXIN) tablet 1,000 mg  1,000 mg Oral Q6H PRN Michael Boston, MD   1,000 mg at 05/28/21 2142  ? mirtazapine (REMERON) tablet 45 mg  45 mg Oral Ardeen Fillers, MD   45 mg at 05/28/21 2143  ? ondansetron (ZOFRAN) tablet 4 mg  4 mg Oral Q6H PRN Michael Boston, MD      ? Or  ? ondansetron (ZOFRAN) injection 4 mg  4 mg  Intravenous Q6H PRN Michael Boston, MD   4 mg at 05/29/21 0947  ? pantoprazole (PROTONIX) EC tablet 40 mg  40 mg Oral Q1200 Michael Boston, MD   40 mg at 05/29/21 1234  ? polycarbophil (FIBERCON) tablet 625 mg  625 mg Oral BID Michael Boston, MD   625 mg at 05/29/21 0945  ? potassium chloride SA (KLOR-CON M) CR tablet 20 mEq  20 mEq Oral Daily Michael Boston, MD   20 mEq at 05/29/21 0947  ? pravastatin (PRAVACHOL) tablet 40 mg  40 mg Oral Daily Michael Boston, MD   20 mg at 05/29/21 0945  ? prochlorperazine (COMPAZINE) tablet 10 mg  10 mg Oral Q6H PRN Michael Boston, MD      ? Or  ? prochlorperazine (COMPAZINE) injection 5-10 mg  5-10 mg Intravenous Q6H PRN Michael Boston, MD      ? propranolol ER (INDERAL LA) 24 hr capsule 160 mg  160 mg Oral Ardeen Fillers, MD   160 mg at 05/28/21 2143  ? risperiDONE (RISPERDAL) tablet 0.25 mg  0.25 mg Oral Ardeen Fillers, MD   0.25 mg at 05/28/21 2143  ? simethicone (MYLICON) chewable tablet 40 mg  40 mg Oral Q6H PRN Michael Boston, MD      ? sodium chloride flush (NS) 0.9 % injection 10-40 mL  10-40 mL Intracatheter Gorden Harms, MD   10 mL at 05/29/21 9191  ? sodium chloride flush (NS) 0.9 % injection 10-40 mL  10-40 mL Intracatheter PRN Michael Boston, MD      ? SUMAtriptan (IMITREX) tablet 100 mg  100 mg Oral BID PRN Michael Boston, MD      ? traMADol Veatrice Bourbon) tablet 50-100 mg  50-100 mg Oral Q6H PRN Michael Boston, MD   100 mg at 05/29/21 0944  ? ? ? ?Discharge Medications: ?Please see discharge summary for a list of discharge medications. ? ?Relevant Imaging Results: ? ?Relevant Lab Results: ? ? ?Additional Information ?SS# [id number];  Covid  vax x 2 ? ?Amnah Breuer, LCSW ? ? ? ? ?

## 2021-05-30 DIAGNOSIS — N1831 Chronic kidney disease, stage 3a: Secondary | ICD-10-CM | POA: Diagnosis not present

## 2021-05-30 DIAGNOSIS — N179 Acute kidney failure, unspecified: Secondary | ICD-10-CM | POA: Diagnosis not present

## 2021-05-30 DIAGNOSIS — Z932 Ileostomy status: Secondary | ICD-10-CM

## 2021-05-30 DIAGNOSIS — R198 Other specified symptoms and signs involving the digestive system and abdomen: Secondary | ICD-10-CM

## 2021-05-30 DIAGNOSIS — I10 Essential (primary) hypertension: Secondary | ICD-10-CM | POA: Diagnosis not present

## 2021-05-30 DIAGNOSIS — C20 Malignant neoplasm of rectum: Secondary | ICD-10-CM | POA: Diagnosis not present

## 2021-05-30 LAB — RENAL FUNCTION PANEL
Albumin: 3.1 g/dL — ABNORMAL LOW (ref 3.5–5.0)
Anion gap: 7 (ref 5–15)
BUN: 47 mg/dL — ABNORMAL HIGH (ref 8–23)
CO2: 21 mmol/L — ABNORMAL LOW (ref 22–32)
Calcium: 7.6 mg/dL — ABNORMAL LOW (ref 8.9–10.3)
Chloride: 103 mmol/L (ref 98–111)
Creatinine, Ser: 3.11 mg/dL — ABNORMAL HIGH (ref 0.61–1.24)
GFR, Estimated: 21 mL/min — ABNORMAL LOW (ref >60)
Glucose, Bld: 195 mg/dL — ABNORMAL HIGH (ref 70–99)
Phosphorus: 4.3 mg/dL (ref 2.5–4.6)
Potassium: 4.6 mmol/L (ref 3.5–5.1)
Sodium: 131 mmol/L — ABNORMAL LOW (ref 135–145)

## 2021-05-30 LAB — URINALYSIS, ROUTINE W REFLEX MICROSCOPIC
Bilirubin Urine: NEGATIVE
Glucose, UA: 100 mg/dL — AB
Ketones, ur: NEGATIVE mg/dL
Leukocytes,Ua: NEGATIVE
Nitrite: NEGATIVE
Protein, ur: 100 mg/dL — AB
Specific Gravity, Urine: 1.015 (ref 1.005–1.030)
pH: 6 (ref 5.0–8.0)

## 2021-05-30 LAB — BASIC METABOLIC PANEL
Anion gap: 6 (ref 5–15)
Anion gap: 6 (ref 5–15)
BUN: 45 mg/dL — ABNORMAL HIGH (ref 8–23)
BUN: 40 mg/dL — ABNORMAL HIGH (ref 8–23)
CO2: 22 mmol/L (ref 22–32)
CO2: 22 mmol/L (ref 22–32)
Calcium: 7.6 mg/dL — ABNORMAL LOW (ref 8.9–10.3)
Calcium: 7.5 mg/dL — ABNORMAL LOW (ref 8.9–10.3)
Chloride: 102 mmol/L (ref 98–111)
Chloride: 104 mmol/L (ref 98–111)
Creatinine, Ser: 3.16 mg/dL — ABNORMAL HIGH (ref 0.61–1.24)
Creatinine, Ser: 2.44 mg/dL — ABNORMAL HIGH (ref 0.61–1.24)
GFR, Estimated: 21 mL/min — ABNORMAL LOW (ref >60)
GFR, Estimated: 28 mL/min — ABNORMAL LOW (ref >60)
Glucose, Bld: 197 mg/dL — ABNORMAL HIGH (ref 70–99)
Glucose, Bld: 218 mg/dL — ABNORMAL HIGH (ref 70–99)
Potassium: 4.6 mmol/L (ref 3.5–5.1)
Potassium: 4.4 mmol/L (ref 3.5–5.1)
Sodium: 130 mmol/L — ABNORMAL LOW (ref 135–145)
Sodium: 132 mmol/L — ABNORMAL LOW (ref 135–145)

## 2021-05-30 LAB — URINALYSIS, MICROSCOPIC (REFLEX): Squamous Epithelial / HPF: NONE SEEN (ref 0–5)

## 2021-05-30 LAB — GLUCOSE, CAPILLARY
Glucose-Capillary: 193 mg/dL — ABNORMAL HIGH (ref 70–99)
Glucose-Capillary: 198 mg/dL — ABNORMAL HIGH (ref 70–99)
Glucose-Capillary: 172 mg/dL — ABNORMAL HIGH (ref 70–99)
Glucose-Capillary: 203 mg/dL — ABNORMAL HIGH (ref 70–99)

## 2021-05-30 LAB — SODIUM, URINE, RANDOM: Sodium, Ur: 11 mmol/L

## 2021-05-30 LAB — CREATININE, URINE, RANDOM: Creatinine, Urine: 90.77 mg/dL

## 2021-05-30 MED ORDER — SODIUM CHLORIDE 0.9 % IV SOLN
INTRAVENOUS | Status: AC
Start: 2021-05-30 — End: 2021-05-30

## 2021-05-30 MED ORDER — MAGNESIUM OXIDE -MG SUPPLEMENT 400 (240 MG) MG PO TABS
400.0000 mg | ORAL_TABLET | Freq: Two times a day (BID) | ORAL | Status: AC
Start: 2021-05-30 — End: 2021-05-30
  Administered 2021-05-30 (×2): 400 mg via ORAL
  Filled 2021-05-30 (×2): qty 1

## 2021-05-30 MED ORDER — SODIUM CHLORIDE 0.9 % IV SOLN: INTRAVENOUS | Status: DC

## 2021-05-30 MED ORDER — SODIUM CHLORIDE 0.9 % IV BOLUS
1000.0000 mL | Freq: Once | INTRAVENOUS | Status: AC
  Administered 2021-05-30: 1000 mL via INTRAVENOUS

## 2021-05-30 MED ORDER — LOPERAMIDE HCL 2 MG PO CAPS
2.0000 mg | ORAL_CAPSULE | Freq: Four times a day (QID) | ORAL | Status: DC | PRN
  Administered 2021-05-30: 2 mg via ORAL

## 2021-05-30 NOTE — Progress Notes (Signed)
Physical Therapy Treatment ?Patient Details ?Name: William Good ?MRN: [id number] ?DOB: 22-Jun-1955 ?Today's Date: 05/30/2021 ? ? ?History of Present Illness Pt s/p low anterior rectosigmoid resection, coloanal anastomisis, and diverting loop ileostomy 2* rectal CA.  Pt with CKD, DM, back surgery, R TKR and CKD ? ?  ?PT Comments  ? ? Pt continues very cooperative and assisted to EOB sitting and to standing before ambulating ~5' with RW and returned to sitting 2* increasing instability and admission to feeling dizzy - BP 103/74 but 1 min later in recliner with feet up 124/75.  RN aware.   ?Recommendations for follow up therapy are one component of a multi-disciplinary discharge planning process, led by the attending physician.  Recommendations may be updated based on patient status, additional functional criteria and insurance authorization. ? ?Follow Up Recommendations ? Skilled nursing-short term rehab (<3 hours/day) ?  ?  ?Assistance Recommended at Discharge Frequent or constant Supervision/Assistance  ?Patient can return home with the following A lot of help with walking and/or transfers;A lot of help with bathing/dressing/bathroom;Assistance with cooking/housework;Assist for transportation;Help with stairs or ramp for entrance ?  ?Equipment Recommendations ? None recommended by PT  ?  ?Recommendations for Other Services   ? ? ?  ?Precautions / Restrictions Precautions ?Precautions: Fall ?Precaution Comments: new ileostomy, JP drain on R ?Restrictions ?Weight Bearing Restrictions: No  ?  ? ?Mobility ? Bed Mobility ?Overal bed mobility: Needs Assistance ?Bed Mobility: Rolling, Sidelying to Sit ?Rolling: Min assist, Mod assist ?Sidelying to sit: Mod assist ?  ?  ?  ?General bed mobility comments: cues for log roll technique ?  ? ?Transfers ?Overall transfer level: Needs assistance ?Equipment used: Rolling walker (2 wheels) ?Transfers: Sit to/from Stand ?Sit to Stand: Min assist, +2 physical assistance, +2  safety/equipment, From elevated surface ?  ?  ?  ?  ?  ?General transfer comment: cues for use of UEs to self assist.  Physical assist to bring wt up and fwd and to balance in standing ?  ? ?Ambulation/Gait ?Ambulation/Gait assistance: Min assist, +2 physical assistance, +2 safety/equipment ?Gait Distance (Feet): 5 Feet ?Assistive device: Rolling walker (2 wheels) ?Gait Pattern/deviations: Step-to pattern, Decreased step length - right, Decreased step length - left, Shuffle ?Gait velocity: decr ?  ?  ?General Gait Details: Pt ambulated short distance in room before becoming increasingly unsteady and admits to feeling dizzy - returned to sitting with BP 103/74 ? ? ?Stairs ?  ?  ?  ?  ?  ? ? ?Wheelchair Mobility ?  ? ?Modified Rankin (Stroke Patients Only) ?  ? ? ?  ?Balance Overall balance assessment: Needs assistance ?Sitting-balance support: Feet supported ?Sitting balance-Leahy Scale: Fair ?  ?  ?Standing balance support: Reliant on assistive device for balance, During functional activity ?Standing balance-Leahy Scale: Poor ?  ?  ?  ?  ?  ?  ?  ?  ?  ?  ?  ?  ?  ? ?  ?Cognition Arousal/Alertness: Awake/alert ?Behavior During Therapy: [facility] for tasks assessed/performed ?Overall Cognitive Status: No family/caregiver present to determine baseline cognitive functioning ?  ?  ?  ?  ?  ?  ?  ?  ?  ?  ?  ?  ?  ?  ?  ?  ?  ?  ?  ? ?  ?Exercises   ? ?  ?General Comments   ?  ?  ? ?Pertinent Vitals/Pain Pain Assessment ?Pain Assessment: Faces ?Faces Pain Scale: Hurts even more ?Pain  Location: abdomen with movement ?Pain Descriptors / Indicators: Guarding, Sore, Grimacing ?Pain Intervention(s): Limited activity within patient's tolerance, Monitored during session, Premedicated before session  ? ? ?Home Living   ?  ?  ?  ?  ?  ?  ?  ?  ?  ?   ?  ?Prior Function    ?  ?  ?   ? ?PT Goals (current goals can now be found in the care plan section) Acute Rehab PT Goals ?Patient Stated Goal: Regain IND ?PT Goal Formulation: With  patient ?Time For Goal Achievement: 06/11/21 ?Potential to Achieve Goals: Good ?Progress towards PT goals: Progressing toward goals ? ?  ?Frequency ? ? ? Min 3X/week ? ? ? ?  ?PT Plan Current plan remains appropriate  ? ? ?Co-evaluation   ?  ?  ?  ?  ? ?  ?AM-PAC PT "6 Clicks" Mobility   ?Outcome Measure ? Help needed turning from your back to your side while in a flat bed without using bedrails?: A Little ?Help needed moving from lying on your back to sitting on the side of a flat bed without using bedrails?: A Lot ?Help needed moving to and from a bed to a chair (including a wheelchair)?: A Lot ?Help needed standing up from a chair using your arms (e.g., wheelchair or bedside chair)?: A Lot ?Help needed to walk in hospital room?: Total ?Help needed climbing 3-5 steps with a railing? : Total ?6 Click Score: 11 ? ?  ?End of Session Equipment Utilized During Treatment: Gait belt ?Activity Tolerance: Patient limited by fatigue;Other (comment) (dizziness) ?Patient left: in chair;with call bell/phone within reach;with chair alarm set;with family/visitor present ?Nurse Communication: Mobility status ?PT Visit Diagnosis: Unsteadiness on feet (R26.81);History of falling (Z91.81);Difficulty in walking, not elsewhere classified (R26.2);Pain ?  ? ? ?Time: 1251-1316 ?PT Time Calculation (min) (ACUTE ONLY): 25 min ? ?Charges:  $Therapeutic Activity: 8-22 mins          ?          ? ?Debe Coder PT ?Acute Rehabilitation Services ?Pager [phone] ?Office [phone] ? ? ? ?Anwar Crill ?05/30/2021, 2:40 PM ? ?

## 2021-05-30 NOTE — Progress Notes (Signed)
Patient bladder scanned with 720m retaining in bladder. Patient straight cath with 859murine output. Will continue to monitor.   ?

## 2021-05-30 NOTE — Progress Notes (Signed)
? ?William Good ?[id number] ?Oct 04, 1955 ? ?CARE TEAM: ? ?PCP: Aura Dials, MD ? ?Outpatient Care Team: Patient Care Team: ?Aura Dials, MD as PCP - General (Family Medicine) ?Michael Boston, MD as Consulting Physician (General Surgery) ?Otis Brace, MD as Consulting Physician (Gastroenterology) ?Ladell Pier, MD as Consulting Physician (Oncology) ?Kyung Rudd, MD as Consulting Physician (Radiation Oncology) ?Earl Gala, Deliah Goody, RN as Equities trader (Oncology) ?Larey Dresser, MD as Consulting Physician (Cardiology) ?Lucas Mallow, MD as Consulting Physician (Urology) ? ?Inpatient Treatment Team: Treatment Team: Attending Provider: Michael Boston, MD; [place] Nurse: McNichol, Rudi Heap, RN; Consulting Physician: Ladell Pier, MD; Rounding Team: Garner Gavel, MD; Consulting Physician: Charlynne Cousins, MD; Utilization Review: Alease Medina, RN; Registered Nurse: Dorinda Hill, RN; Physical Therapist: Mathis Fare, PT; Consulting Physician: Roney Jaffe, MD; Student Nurse: Cherie Dark, Madelaine Bhat; Case Manager: Tawanna Cooler, RN ? ? ?Problem List:  ? ?Principal Problem: ?  Rectal cancer ([name]) ?Active Problems: ?  Essential hypertension ?  Migraine ?  Chronic kidney disease, stage 3 unspecified ([name]) ?  Type 2 diabetes mellitus with complication, without long-term current use of insulin ([place]) ?  Bipolar disorder ([place]) ?  Polycystic kidney ?  Coronary artery calcification ?  AKI (acute kidney injury) ([name]) ?  High output ileostomy ([name]) ? ? ?3 Days Post-Op  05/27/2021 ? ?POST-OPERATIVE DIAGNOSIS:  DISTAL RECTAL CANCER ?  ?PROCEDURE:   ?-ROBOTIC LOW ANTERIOR RECTOSIGMOID RESECTION ?-VERY LOW COLOANAL ANASTOMOSIS ?-DIVERTING LOOP ILEOSTOMY ?-INTRAOPERATIVE ASSESSMENT OF TISSUE VASCULAR PERFUSION USING ICG (indocyanine green) IMMUNOFLUORESCENCE ?-TRANSVERSUS ABDOMINIS PLANE (TAP) BLOCK - BILATERAL ?-RIGID PROCTOSCOPY ?  ?SURGEON:  Adin Hector, MD ? ?OR  FINDINGS:  ?  ?Patient had fibrous scarring in the distal rectum 4-7 cm from the anal verge.  Some thin stricturing.  Consistent with pretty good pathological response compared to the very bulky tumor at first.  Very fibrotic and narrow pelvis with bulky mesorectum. ?  ?No obvious metastatic disease on visceral parietal peritoneum or liver. ?  ?The anastomosis rests 2 cm from the anal verge by rigid proctoscopy.  There is a 31 EEA mid descending colon to anal rectal margin stump ? ?###################################### ? ?Preoperative diagnosis:  ?1. Ureteral identification for pelvic surgery ?  ?Postoperative diagnosis: ?1. same ?  ?Procedure(s): ?1. Cystoscopy with bilateral ureteral catheterization for firefly injection ?  ?Surgeon: Dr. Donald Pore ?  ? ? ? ?Assessment ? ?Recovering ? ?(Hospital Stay = 3 days) ? ?Plan: ? ?ERAS enhance recovery pathway ? ?Feeling more bloated on high output ileostomy regimen.  Output tapered down.  Hold iron and Imodium.  Make Imodium as needed.  Continue fiber.  Bloating should resolve. ? ?Okay to try solid diet. ? ?Increased creatinine in the setting of chronic kidney disease.  Patient nonoliguric with better urine output but creatinine increased.  Discussed with hospitalist.  Dr. Olevia Bowens requested nephrology to help follow.  Continue to avoid NSAIDs, balance between lower IV fluids from a cardiopulmonary standpoint per cardiology wishes without getting toe dry that renal function worsens.  Hopefully we can thread the needle. ? ?Transition to intermittent IV fluid boluses.  Suspect he would benefit from 6 weeks of intermittent IV fluid boluses q. Monday Wednesday Friday through the Port-A-Cath to avoid dehydration at discharge. ? ?Diabetic control.  Sliding scale insulin started .  We will ask medicine to have input to help start regimen since he is not on anything.  Called [facility] - Dr Olevia Bowens most likely the  one to help see & offer recs  ? ?Follow-up on pathology. ? ?Patient had  small EEA anastomotic leak controlled with drain and planned loop ileostomy diversion.  Drain looks serosanguineous today compared to slightly thinly feculent yesterday = probably already sealed up.   Hopeful sign.  Follow.  Keep drain for several weeks. ? ?Foley out per protocol from surgery standpoint.  Defer to nephrology if they feel he needs to continue it. ? ?Ileostomy care and training. ? ?Have involvement with physical, occupational therapies.  Also case management since patient apparently lives at home but is borderline functioning/independent and brother most likely does not feel comfortable managing patient by himself.   ? ?Seems to be rather deconditioned.  I believe he will require a SNF.  Current therapies agree with that.  They will help follow. ? ?-VTE prophylaxis- SCDs, etc ? ?-mobilize as tolerated to help recovery.  Patient feeling a little wiped out.  Keep trying to mobilize as tolerated. ? ?Disposition:  ?Disposition:  ?The patient is from: Home ? ?Anticipate discharge to:  [facility] (SNF) ? ?Anticipated Date of Discharge is:  March 27,2023 ?  ? ?Barriers to discharge:  Pending Clinical improvement (more likely than not) ? ?Patient currently is NOT MEDICALLY STABLE for discharge from the hospital from a surgery standpoint. ? ? ? ? ? ?I reviewed nursing notes, last 24 h vitals and pain scores, last 48 h intake and output, last 24 h labs and trends, and last 24 h imaging results. I have reviewed this patient's available data, including medical history, events of note, test results, etc as part of my evaluation.  A significant portion of that time was spent in counseling.  Care during the described time interval was provided by me. ? ?This care required moderate level of medical decision making.  05/30/2021 ? ? ? ?Subjective: ?(Chief complaint) ? ?Patient feeling tired and wiped out. ? ?No nausea or vomiting.  Trying some soft food. ? ?Brother in room. ? ?Objective: ? ?Vital  signs: ? ?Vitals:  ? 05/29/21 2058 05/29/21 2256 05/30/21 0036 05/30/21 0525  ?BP: 110/79 121/83 132/86 128/78  ?Pulse: 71 74  68  ?Resp: '18 16 18 16  '$ ?Temp: 97.7 ?F (36.5 ?C) 98.2 ?F (36.8 ?C) 97.9 ?F (36.6 ?C) 98.6 ?F (37 ?C)  ?TempSrc: Oral Oral Oral Oral  ?SpO2: 95%  95%   ?Weight:      ?Height:      ? ? ?Last BM Date : 05/26/21 ? ?Intake/Output  ? ?Yesterday: ? 03/24 0701 - 03/25 0700 ?In: 1803 [P.O.:600; I.V.:1203] ?Out: 1800 [Urine:1500; Drains:175; Stool:125] ?This shift: ? No intake/output data recorded. ? ?Bowel function: ? Flatus: YES ? BM:  YES ? Drain: Thinly serosanguineous.  No feculent tinged ? ?Physical Exam: ? ?General: Pt awake/alert in no acute distress.  Looks a little tired and spacey which is his baseline. ?Eyes: PERRL, normal EOM.  Sclera clear.  No icterus ?Neuro: CN II-XII intact w/o focal sensory/motor deficits. ?Lymph: No head/neck/groin lymphadenopathy ?Psych:  No delerium/psychosis/paranoia.  Oriented x 4 ?HENT: Normocephalic, Mucus membranes moist.  No thrush ?Neck: Supple, No tracheal deviation.  No obvious thyromegaly ?Chest: No pain to chest wall compression.  Good respiratory excursion.  No audible wheezing ?CV:  Pulses intact.  Regular rhythm.  No major extremity edema ?MS: Normal AROM mjr joints.  No obvious deformity ? ?Abdomen: Soft.  Mildy distended.  Nontender.  No evidence of peritonitis.  No incarcerated hernias. ?Ileostomy right abdomen pink with gas  and thin dark green effluent in bag.   ? ?Ext:   No deformity.  No mjr edema.  No cyanosis ?Skin: No petechiae / purpurea.  No major sores.  Warm and dry ? ? ? ?Results:  ? ?Cultures: ?No results found for this or any previous visit (from the past 720 hour(s)). ? ?Labs: ?Results for orders placed or performed during the hospital encounter of 05/27/21 (from the past 48 hour(s))  ?Glucose, capillary     Status: Abnormal  ? Collection Time: 05/28/21 11:54 AM  ?Result Value Ref Range  ? Glucose-Capillary 185 (H) 70 - 99 mg/dL  ?   Comment: Glucose reference range applies only to samples taken after fasting for at least 8 hours.  ?Glucose, capillary     Status: Abnormal  ? Collection Time: 05/28/21  4:54 PM  ?Result Value Ref Range  ? Glucose-C

## 2021-05-30 NOTE — Progress Notes (Signed)
Per order foley in place with first output of 104ms. Amber in color and occasional clotting noted.  ?

## 2021-05-30 NOTE — Progress Notes (Signed)
[facility] ?CONSULT PROGRESS NOTE ? ? ? ?Progress Note  ?William Good  HCW:[id number] DOB: Feb 14, 1956 DOA: 05/27/2021 ?PCP: William Dials, MD  ? ? ? ?Brief Narrative:  ? ?William Good is an 66 y.o. male past medical history of chronic back pain, bipolar disorder, stage II chronic kidney disease and diabetes mellitus type 2, history of rectal cancer underwent robotic anterior rectosigmoid resection with very low coloanal anastomosis and diverting colostomy tube were evaluating for uncontrolled diabetes mellitus and chronic kidney disease ? ? ?Assessment/Plan:  ? ?Rectal cancer [facility]): ?Underwent surgery yesterday continue postop management per general surgery Dr. Johney Good. ?Use narcotics for analgesic ? ?Acute kidney injury superimposed on polycystic kidney disease/chronic kidney disease stage II: ?With a baseline creatinine 1.0-1.2. He has been fluids resuscitated, despite this his renal function continues to deteriorate. ?He is not receive any ACE inhibitor's, no signs of hypotension at home he was on a diuretic and ACE inhibitor which were held. ?Renal ultrasound showed no acute findings just chronic changes.  Right renal cyst without evidence of hydronephrosis left renal cortex is atrophic with chronic cystic changes and chronic UPJ obstruction ?Follow strict I's and O's and daily weights, avoid hypotension and nephrotoxic drug. ?His creatinine is 3.1 this morning we will go ahead and consult renal. ?Continue IV fluids at current rate.  Change to normal saline we will go ahead and bolus, liter ?Use narcotics for analgesic ? ?Essential hypertension: ?Continue propanolol. ?Holding ACE inhibitor and diuretic therapy. ?Agree with IV metoprolol, avoid ACE inhibitor IV or oral. ?We will allow a degree of permissive hypertension ? ?Migraine: ?Continue propranolol sumatriptan and other analgesics. ? ?Diabetes mellitus type 2 with long-term use of insulin: ?With an A1c of 7.3. ?Continue carb modified  diet, currently on sliding scale blood glucose fairly controlled. ?We will not add long-acting insulin at this point in time as his renal function deteriorates his insulin requirements will be lower. ? ?Bipolar disorder: ?Continue mirtazapine, risperidone and alprazolam. ? ?CAD: ?Continue aspirin, statins and beta-blockers. ? ? ?DVT prophylaxis: lovenox ?Family Communication:none ? ? ? ?Code Status:  ? ?  ?Code Status Orders  ?(From admission, onward)  ?  ? ? ?  ? ?  Start     Ordered  ? 05/27/21 1540  Full code  Continuous       ? 05/27/21 1539  ? ?  ?  ? ?  ? ?Code Status History   ? ? This patient has a current code status but no historical code status.  ? ?  ? ? ? ? ?IV Access:  ? ?Peripheral IV ? ? ?Procedures and diagnostic studies:  ? ?US RENAL ? ?Result Date: 05/29/2021 ?CLINICAL DATA:  66 year old male with a history acute kidney injury EXAM: RENAL / URINARY TRACT ULTRASOUND COMPLETE COMPARISON:  CT 08/29/2020 FINDINGS: Right Kidney: Length: 10.5 cm x 5.5 cm x 9.0 cm, 269 cc. Cystic lesion involving the right kidney, compatible with prior CT measures 8.2 cm x 6.1 cm x 6.2 cm. No evidence of associated hydronephrosis. Unremarkable echotexture of the right kidney cortical tissue. Left Kidney: Length: Left renal cortex has been replaced by advanced cystic change/hydronephrosis in the setting of chronic left UPJ obstruction described on prior CT. Bladder: Appears normal for degree of bladder distention. IMPRESSION: Redemonstration of right renal cyst without evidence of superimposed hydronephrosis. Greatest dimension estimated 8.2 cm. Left renal cortex not well evaluated and is atrophic/replaced by chronic cystic change/hydronephrosis in the setting of chronic UPJ obstruction described on prior CT.  Electronically Signed   By: William Good D.O.   On: 05/29/2021 10:30  ? ?[place] EKG SITE RITE ? ?Result Date: 05/29/2021 ?If [employer] not attached, placement could not be confirmed due to current cardiac rhythm.   ? ? ?Medical Consultants:  ? ?None. ? ? ?Subjective:  ? ? ?William Good he continues to have back pain. ? ?Objective:  ? ? ?Vitals:  ? 05/29/21 2058 05/29/21 2256 05/30/21 0036 05/30/21 0525  ?BP: 110/79 121/83 132/86 128/78  ?Pulse: 71 74  68  ?Resp: '18 16 18 16  '$ ?Temp: 97.7 ?F (36.5 ?C) 98.2 ?F (36.8 ?C) 97.9 ?F (36.6 ?C) 98.6 ?F (37 ?C)  ?TempSrc: Oral Oral Oral Oral  ?SpO2: 95%  95%   ?Weight:      ?Height:      ? ?SpO2: 95 % ?O2 Flow Rate (L/min): 2 L/min ? ? ?Intake/Output Summary (Last 24 hours) at 05/30/2021 0714 ?Last data filed at 05/30/2021 0546 ?Gross per 24 hour  ?Intake 483 ml  ?Output 1720 ml  ?Net -1237 ml  ? ? ?Filed Weights  ? 05/27/21 0659 05/27/21 1536 05/28/21 0537  ?Weight: 108.9 kg 108.9 kg 106.1 kg  ? ? ?Exam: ?General exam: In no acute distress. ?Respiratory system: Good air movement and clear to auscultation. ?Cardiovascular system: S1 & S2 heard, RRR. No JVD. ?Gastrointestinal system: Abdomen is nondistended, soft and nontender.  ?Extremities: No pedal edema. ?Skin: No rashes, lesions or ulcers ?Psychiatry: Judgement and insight appear normal. Mood & affect appropriate. ? ? ?Data Reviewed:  ? ? ?Labs: ?Basic Metabolic Panel: ?Recent Labs  ?Lab 05/28/21 ?0400 05/29/21 ?0312 05/30/21 ?0306  ?NA 134* 133* 131*  130*  ?K 4.4 4.1 4.6  4.6  ?CL 104 104 103  102  ?CO2 23 23 21*  22  ?GLUCOSE 193* 179* 195*  197*  ?BUN 28* 30* 47*  45*  ?CREATININE 1.74* 1.95* 3.11*  3.16*  ?CALCIUM 7.8* 7.6* 7.6*  7.6*  ?MG 1.5*  --   --   ?PHOS  --  4.8* 4.3  ? ? ?GFR ?Estimated Creatinine Clearance: 29.4 mL/min (A) (by C-G formula based on SCr of 3.11 mg/dL (H)). ?Liver Function Tests: ?Recent Labs  ?Lab 05/28/21 ?0400 05/29/21 ?0312 05/30/21 ?0306  ?AST 43*  --   --   ?ALT 39  --   --   ?ALKPHOS 69  --   --   ?BILITOT 0.4  --   --   ?PROT 5.8*  --   --   ?ALBUMIN 3.1* 3.3* 3.1*  ? ? ?No results for input(s): LIPASE, AMYLASE in the last 168 hours. ?No results for input(s): AMMONIA in the last 168  hours. ?Coagulation profile ?No results for input(s): INR, PROTIME in the last 168 hours. ?COVID-19 Labs ? ?No results for input(s): DDIMER, FERRITIN, LDH, CRP in the last 72 hours. ? ?No results found for: SARSCOV2NAA ? ?CBC: ?Recent Labs  ?Lab 05/28/21 ?0400 05/29/21 ?0312  ?WBC 9.7  --   ?HGB 13.5 12.4*  ?HCT 38.8*  --   ?MCV 97.5  --   ?PLT 124*  --   ? ? ?Cardiac Enzymes: ?No results for input(s): CKTOTAL, CKMB, CKMBINDEX, TROPONINI in the last 168 hours. ?BNP (last 3 results) ?No results for input(s): PROBNP in the last 8760 hours. ?CBG: ?Recent Labs  ?Lab 05/28/21 ?2145 05/29/21 ?0732 05/29/21 ?1144 05/29/21 ?1624 05/29/21 ?2249  ?GLUCAP 195* 183* 167* 248* 168*  ? ? ?D-Dimer: ?No results for input(s): DDIMER in the  last 72 hours. ?Hgb A1c: ?No results for input(s): HGBA1C in the last 72 hours. ?Lipid Profile: ?No results for input(s): CHOL, HDL, LDLCALC, TRIG, CHOLHDL, LDLDIRECT in the last 72 hours. ?Thyroid function studies: ?No results for input(s): TSH, T4TOTAL, T3FREE, THYROIDAB in the last 72 hours. ? ?Invalid input(s): FREET3 ?Anemia work up: ?No results for input(s): VITAMINB12, FOLATE, FERRITIN, TIBC, IRON, RETICCTPCT in the last 72 hours. ?Sepsis Labs: ?Recent Labs  ?Lab 05/28/21 ?0400  ?WBC 9.7  ? ? ?Microbiology ?No results found for this or any previous visit (from the past 240 hour(s)). ? ? ?Medications:  ? ? acetaminophen  1,000 mg Oral Q6H  ? ALPRAZolam  1 mg Oral BID  ? alvimopan  12 mg Oral BID  ? aspirin EC  81 mg Oral Daily  ? Chlorhexidine Gluconate Cloth  6 each Topical Once  ? cholecalciferol  2,000 Units Oral Daily  ? enoxaparin (LOVENOX) injection  40 mg Subcutaneous Q24H  ? feeding supplement (GLUCERNA SHAKE)  237 mL Oral BID BM  ? ferrous sulfate  325 mg Oral BID WC  ? insulin aspart  0-15 Units Subcutaneous TID WC  ? insulin aspart  0-5 Units Subcutaneous QHS  ? insulin aspart  2 Units Subcutaneous TID WC  ? lip balm  1 application. Topical BID  ? loperamide  2 mg Oral BID  ?  magnesium oxide  400 mg Oral BID  ? mirtazapine  45 mg Oral QHS  ? pantoprazole  40 mg Oral Q1200  ? polycarbophil  625 mg Oral BID  ? potassium chloride SA  20 mEq Oral Daily  ? pravastatin  40 mg Oral Daily  ? propr

## 2021-05-30 NOTE — Consult Note (Addendum)
Renal Service ?Consult Note ?[facility] ? ?William Good ?05/30/2021 ?Sol Blazing, MD ?Requesting Physician: Dr. Johney Maine  ? ?Reason for Consult: Renal failure ?HPI: The patient is a 66 y.o. year-old w/ hx of bipolar d/o, CKD stage II, DM2 and hx of rectal cancer who was admitted 05/28/21 after elective surgery for his rectal cancer done by gen surgery. Pt has known kidney problems w/ creat 1.7 on admission. Two days later today his creat is up to 3.1.  We are asked to see for renal failure.  ? ?Pt seen in room, had foley cath in place, removed now. Having "trouble passing my water", had to stand up to void. Also had I/o cath last night for 850 cc urine in bladder per RN.   ? ?Has received no nephrotoxins , no contrast , no IV vanc , ACEi/ ARB or diuretics. Has rec'd IV cefotetan, mutliple LR boluses, pain meds, other prns etc. ? ?ROS - denies CP, no joint pain, no HA, no blurry vision, no rash, no diarrhea, no nausea/ vomiting, no dysuria ? ? ?Past Medical History  ?Past Medical History:  ?Diagnosis Date  ? Back pain   ? Bipolar disorder ([place])   ? bipolar 1  ? CKD (chronic kidney disease) stage 2, GFR 60-89 ml/min 09/24/2020  ? Hypertension   ? Knee pain   ? Migraine   ? Rectal cancer ([place])   ? Type 2 diabetes mellitus with complication, without long-term current use of insulin ([place]) 03/19/2021  ? ?Past Surgical History  ?Past Surgical History:  ?Procedure Laterality Date  ? BACK SURGERY    ? JOINT REPLACEMENT Right   ? knee  ? OSTOMY N/A 05/27/2021  ? Procedure: POSSIBLE OSTOMY;  Surgeon: Michael Boston, MD;  Location: [facility];  Service: General;  Laterality: N/A;  ? PORTACATH PLACEMENT N/A 10/02/2020  ? Procedure: PORT-A-CATH PLACEMENT;  Surgeon: Dwan Bolt, MD;  Location: [facility];  Service: General;  Laterality: N/A;  ? PROCTOSCOPY N/A 05/27/2021  ? Procedure: RIGID PROCTOSCOPY;  Surgeon: Michael Boston, MD;  Location: [facility];  Service: General;  Laterality: N/A;  ? XI ROBOTIC ASSISTED LOWER  ANTERIOR RESECTION N/A 05/27/2021  ? Procedure: ROBOTIC LOW ANTERIOR COLON RESECTION, VERY LOW COLOANAL ANASTOMOSIS, DIVERTING ILEOSTOMY, BILATERAL TAP BLOCK,  FIRFLY INJECTION;  Surgeon: Michael Boston, MD;  Location: [facility];  Service: General;  Laterality: N/A;  ? ?Family History  ?Family History  ?Problem Relation Age of Onset  ? ALS Mother   ? Heart disease Mother   ? Parkinson's disease Mother   ? Heart failure Father   ? ?Social History  reports that he quit smoking about 5 weeks ago. His smoking use included cigarettes. He has a 22.50 pack-year smoking history. He has never used smokeless tobacco. He reports that he does not drink alcohol and does not use drugs. ?Allergies No Known Allergies ?Home medications ?Prior to Admission medications   ?Medication Sig Start Date End Date Taking? Authorizing Provider  ?acetaminophen (TYLENOL) 325 MG tablet Take 650 mg every 4 (four) hours as needed by mouth for mild pain.   Yes [provider]  ?albuterol (VENTOLIN HFA) 108 (90 Base) MCG/ACT inhaler Inhale 2 puffs into the lungs every 6 (six) hours as needed for wheezing or shortness of breath. 10/24/20  Yes Owens Shark, NP  ?ALPRAZolam (XANAX) 1 MG tablet Take 1 mg by mouth 2 (two) times daily. 01/08/17  Yes [provider]  ?aspirin EC 81 MG tablet Take 81  mg by mouth daily. Swallow whole.   Yes [provider]  ?Cholecalciferol (VITAMIN D3) 50 MCG (2000 UT) capsule Take 2,000 Units by mouth daily. 03/08/20  Yes [provider]  ?HYDROcodone-acetaminophen (NORCO/VICODIN) 5-325 MG tablet Take 0.5-1 tablets by mouth every 12 (twelve) hours as needed for moderate pain. 04/01/21  Yes Rana Snare, NP  ?loperamide (IMODIUM) 2 MG capsule Take 2 mg by mouth as needed for diarrhea or loose stools.   Yes [provider]  ?LOTENSIN HCT 20-12.5 MG per tablet TAKE (2) TABLETS DAILY ?Patient taking differently: Take 0.5 tablets by mouth daily. 03/11/11  Yes Laurey Morale, MD  ?magnesium  oxide (MAG-OX) 400 (240 Mg) MG tablet TAKE ONE TABLET TWICE DAILY 05/15/21  Yes Ladene Artist, MD  ?mirtazapine (REMERON) 45 MG tablet Take 45 mg by mouth at bedtime.   Yes [provider]  ?omeprazole (PRILOSEC) 20 MG capsule Take 1 capsule (20 mg total) 2 (two) times daily by mouth. 01/17/17  Yes Rolland Porter, MD  ?polyethylene glycol powder (GLYCOLAX/MIRALAX) 17 GM/SCOOP powder Take 17 g by mouth daily as needed for moderate constipation.   Yes [provider]  ?potassium chloride SA (KLOR-CON M) 20 MEQ tablet TAKE ONE TABLET ONCE DAILY 05/26/21  Yes Ladene Artist, MD  ?pravastatin (PRAVACHOL) 40 MG tablet Take 40 mg by mouth daily. 06/26/20  Yes [provider]  ?propranolol ER (INDERAL LA) 160 MG SR capsule Take 160 mg daily by mouth. 10/25/16  Yes [provider]  ?risperiDONE (RISPERDAL) 0.25 MG tablet Take 0.25 mg by mouth at bedtime. 12/22/20  Yes [provider]  ?SUMAtriptan (IMITREX) 100 MG tablet Take 100 mg by mouth 2 (two) times daily as needed for migraine. 01/07/17  Yes [provider]  ?lidocaine-prilocaine (EMLA) cream Apply 1 application topically as directed. Apply 1/2 tablespoon to port-a-cath site 2 hours prior to stick and cover with plastic wrap to numb site ?Patient not taking: Reported on 05/12/2021 10/01/20   Ladene Artist, MD  ?ondansetron (ZOFRAN) 8 MG tablet Take 1 tablet (8 mg total) by mouth every 8 (eight) hours as needed for nausea or vomiting. ?Patient not taking: Reported on 05/12/2021 10/01/20   Ladene Artist, MD  ?prochlorperazine (COMPAZINE) 10 MG tablet Take 1 tablet (10 mg total) by mouth every 6 (six) hours as needed for nausea. ?Patient not taking: Reported on 05/12/2021 11/05/20   Rana Snare, NP  ? ? ? ?Vitals:  ? 05/29/21 2058 05/29/21 2256 05/30/21 0036 05/30/21 0525  ?BP: 110/79 121/83 132/86 128/78  ?Pulse: 71 74  68  ?Resp: 18 16 18 16   ?Temp: 97.7 ?F (36.5 ?C) 98.2 ?F (36.8 ?C) 97.9 ?F (36.6 ?C) 98.6 ?F (37 ?C)   ?TempSrc: Oral Oral Oral Oral  ?SpO2: 95%  95%   ?Weight:      ?Height:      ? ?Exam ?Gen alert, no distress ?No rash, cyanosis or gangrene ?Sclera anicteric, throat clear  ?R chest port is accessed ?No jvd or bruits ?Chest clear bilat to bases, no rales/ wheezing ?RRR no RG ?Abd soft ntnd no mass or ascites RLQ ostomy intact, +bs ?GU normal male ?MS no joint effusions or deformity ?Ext no LE or UE edema, no wounds or ulcers ?Neuro is alert, Ox 3 , nf ?   ? ? ? ? ? Home meds include - xanax prn, asa, norco prn, lotensin 20-12.5 bid, remeron, prilosec, klorcon, pravachol, propranolol ER, risperidone, prns/ vits/ supps ? ? ?  Date  Creat  eGFR ?  2008  0.91 ?  2011  1.5 ?  2018  1.4 ?  2022  1.0- 1.47 ?  03/16/21 1.25 ?  04/14/21 1.07 ?  05/14/21 1.25 ?  05/28/21 1.74 ?  05/29/21 1.95 ?  05/30/21 3.11 ? ? ? ? UA 3/25 - 100 prot, rare bact, 6-10 rbc, 0-5 wbc, no epis ?   ?  Renal US 3/24 - IMPRESSION: Redemonstration of right renal cyst without evidence of superimposed hydronephrosis. Greatest dimension estimated 8.2 cm. Left renal cortex not well evaluated and is atrophic/replaced by chronic cystic change/hydronephrosis in the setting of chronic UPJ obstruction described on prior CT. ? ? ?Assessment/ Plan: ?AKI - b/l creatinine 1.0- 1.4 from 2022, eGFR 52 - > 46ml/min . Here creat was 1.7 on admission and is up to 1.9 yest and 3.1 today. Renal US here showed atrophic L kidney (w/ hx of chronic UPJ obstruction/ hydronephrosis as per CT abd mid 2022) with no hydro on the R side. Essentially he has a solitary kidney on the R side. Pt having bladder retention since foley cath was pulled. Suspect this is a primary issue. Also may be dry - plan start IVF"s and have foley replaced. Get urine lytes, will follow.  ?Rectal cancer - sp elective surgery on 3/22.  ?DM2 ?Bipolar d/o ?HTN - holding acei and hctz w/ AKI. Getting propranolol, BP's good. No hypotension here.  ?  ? ? ? ?Kelly Splinter  MD ?05/30/2021, 9:10 AM ?Recent Labs  ?Lab  05/28/21 ?0400 05/29/21 ?0312 05/30/21 ?0306  ?HGB 13.5 12.4*  --   ?ALBUMIN 3.1* 3.3* 3.1*  ?CALCIUM 7.8* 7.6* 7.6*  7.6*  ?PHOS  --  4.8* 4.3  ?CREATININE 1.74* 1.95* 3.11*  3.16*  ?K 4.4 4.1 4.6  4

## 2021-05-31 DIAGNOSIS — N1831 Chronic kidney disease, stage 3a: Secondary | ICD-10-CM | POA: Diagnosis not present

## 2021-05-31 DIAGNOSIS — C20 Malignant neoplasm of rectum: Secondary | ICD-10-CM | POA: Diagnosis not present

## 2021-05-31 DIAGNOSIS — E118 Type 2 diabetes mellitus with unspecified complications: Secondary | ICD-10-CM | POA: Diagnosis not present

## 2021-05-31 DIAGNOSIS — N179 Acute kidney failure, unspecified: Secondary | ICD-10-CM | POA: Diagnosis not present

## 2021-05-31 LAB — CBC
HCT: 24.2 % — ABNORMAL LOW (ref 39.0–52.0)
Hemoglobin: 8.2 g/dL — ABNORMAL LOW (ref 13.0–17.0)
MCH: 33.2 pg (ref 26.0–34.0)
MCHC: 33.9 g/dL (ref 30.0–36.0)
MCV: 98 fL (ref 80.0–100.0)
Platelets: 121 10*3/uL — ABNORMAL LOW (ref 150–400)
RBC: 2.47 MIL/uL — ABNORMAL LOW (ref 4.22–5.81)
RDW: 13.7 % (ref 11.5–15.5)
WBC: 10.3 10*3/uL (ref 4.0–10.5)
nRBC: 0 % (ref 0.0–0.2)

## 2021-05-31 LAB — PHOSPHORUS: Phosphorus: 2.9 mg/dL (ref 2.5–4.6)

## 2021-05-31 LAB — BASIC METABOLIC PANEL
Anion gap: 7 (ref 5–15)
BUN: 36 mg/dL — ABNORMAL HIGH (ref 8–23)
CO2: 22 mmol/L (ref 22–32)
Calcium: 7.5 mg/dL — ABNORMAL LOW (ref 8.9–10.3)
Chloride: 103 mmol/L (ref 98–111)
Creatinine, Ser: 1.99 mg/dL — ABNORMAL HIGH (ref 0.61–1.24)
GFR, Estimated: 36 mL/min — ABNORMAL LOW (ref >60)
Glucose, Bld: 187 mg/dL — ABNORMAL HIGH (ref 70–99)
Potassium: 4.1 mmol/L (ref 3.5–5.1)
Sodium: 132 mmol/L — ABNORMAL LOW (ref 135–145)

## 2021-05-31 LAB — MAGNESIUM: Magnesium: 1.9 mg/dL (ref 1.7–2.4)

## 2021-05-31 LAB — GLUCOSE, CAPILLARY
Glucose-Capillary: 181 mg/dL — ABNORMAL HIGH (ref 70–99)
Glucose-Capillary: 146 mg/dL — ABNORMAL HIGH (ref 70–99)
Glucose-Capillary: 166 mg/dL — ABNORMAL HIGH (ref 70–99)
Glucose-Capillary: 148 mg/dL — ABNORMAL HIGH (ref 70–99)

## 2021-05-31 MED ORDER — CHLORHEXIDINE GLUCONATE CLOTH 2 % EX PADS
6.0000 | MEDICATED_PAD | Freq: Every day | CUTANEOUS | Status: DC
  Administered 2021-06-01 – 2021-06-05 (×5): 6 via TOPICAL

## 2021-05-31 NOTE — Progress Notes (Signed)
Physical Therapy Treatment ?Patient Details ?Name: William Good ?MRN: [id number] ?DOB: June 07, 1955 ?Today's Date: 05/31/2021 ? ? ?History of Present Illness Pt s/p low anterior rectosigmoid resection, coloanal anastomisis, and diverting loop ileostomy 2* rectal CA.  Pt with CKD, DM, back surgery, R TKR and CKD ? ?  ?PT Comments  ? ? Pt very cooperative as always but continues to fatigue easily and notably orthostatic with attempts at OOB activity.  This date pt assisted to EOB sitting, stood twice and ambulated very limited distance to sit up in recliner.  BP supine 107/63; sitting EOB 106/75; standing 80/61; after move to recliner 94/80 - RN aware.  ?Recommendations for follow up therapy are one component of a multi-disciplinary discharge planning process, led by the attending physician.  Recommendations may be updated based on patient status, additional functional criteria and insurance authorization. ? ?Follow Up Recommendations ? Skilled nursing-short term rehab (<3 hours/day) ?  ?  ?Assistance Recommended at Discharge Frequent or constant Supervision/Assistance  ?Patient can return home with the following A lot of help with walking and/or transfers;A lot of help with bathing/dressing/bathroom;Assistance with cooking/housework;Assist for transportation;Help with stairs or ramp for entrance ?  ?Equipment Recommendations ? None recommended by PT  ?  ?Recommendations for Other Services   ? ? ?  ?Precautions / Restrictions Precautions ?Precautions: Fall ?Precaution Comments: new ileostomy, JP drain on R ?Restrictions ?Weight Bearing Restrictions: No  ?  ? ?Mobility ? Bed Mobility ?Overal bed mobility: Needs Assistance ?Bed Mobility: Rolling, Sidelying to Sit ?Rolling: Min assist, Mod assist ?Sidelying to sit: Min assist, Mod assist ?  ?  ?  ?General bed mobility comments: cues for log roll technique ?  ? ?Transfers ?Overall transfer level: Needs assistance ?Equipment used: Rolling walker (2 wheels) ?Transfers:  Sit to/from Stand ?Sit to Stand: Min assist, +2 physical assistance, +2 safety/equipment, From elevated surface ?  ?  ?  ?  ?  ?  ?  ? ?Ambulation/Gait ?Ambulation/Gait assistance: Min assist, +2 physical assistance, +2 safety/equipment ?Gait Distance (Feet): 4 Feet ?Assistive device: Rolling walker (2 wheels) ?Gait Pattern/deviations: Step-to pattern, Decreased step length - right, Decreased step length - left, Shuffle ?Gait velocity: decr ?  ?  ?General Gait Details: Pt ambulated short distance in room only - limited by fatigue and notably orthostatic ? ? ?Stairs ?  ?  ?  ?  ?  ? ? ?Wheelchair Mobility ?  ? ?Modified Rankin (Stroke Patients Only) ?  ? ? ?  ?Balance Overall balance assessment: Needs assistance ?Sitting-balance support: Feet supported ?Sitting balance-Leahy Scale: Fair ?  ?  ?Standing balance support: Reliant on assistive device for balance, During functional activity ?Standing balance-Leahy Scale: Poor ?  ?  ?  ?  ?  ?  ?  ?  ?  ?  ?  ?  ?  ? ?  ?Cognition Arousal/Alertness: Awake/alert ?Behavior During Therapy: Flat affect ?Overall Cognitive Status: No family/caregiver present to determine baseline cognitive functioning ?  ?  ?  ?  ?  ?  ?  ?  ?  ?  ?  ?  ?  ?  ?  ?  ?  ?  ?  ? ?  ?Exercises   ? ?  ?General Comments   ?  ?  ? ?Pertinent Vitals/Pain Pain Assessment ?Pain Assessment: No/denies pain  ? ? ?Home Living   ?  ?  ?  ?  ?  ?  ?  ?  ?  ?   ?  ?  Prior Function    ?  ?  ?   ? ?PT Goals (current goals can now be found in the care plan section) Acute Rehab PT Goals ?Patient Stated Goal: Regain IND ?PT Goal Formulation: With patient ?Time For Goal Achievement: 06/11/21 ?Potential to Achieve Goals: Good ?Progress towards PT goals: Not progressing toward goals - comment (continues ltd by fatigue and orthostatic) ? ?  ?Frequency ? ? ? Min 3X/week ? ? ? ?  ?PT Plan Current plan remains appropriate  ? ? ?Co-evaluation PT/OT/SLP Co-Evaluation/Treatment: Yes ?Reason for Co-Treatment: For  patient/therapist safety ?PT goals addressed during session: Mobility/safety with mobility ?OT goals addressed during session: ADL's and self-care ?  ? ?  ?AM-PAC PT "6 Clicks" Mobility   ?Outcome Measure ? Help needed turning from your back to your side while in a flat bed without using bedrails?: A Little ?Help needed moving from lying on your back to sitting on the side of a flat bed without using bedrails?: A Lot ?Help needed moving to and from a bed to a chair (including a wheelchair)?: A Lot ?Help needed standing up from a chair using your arms (e.g., wheelchair or bedside chair)?: A Lot ?Help needed to walk in hospital room?: Total ?Help needed climbing 3-5 steps with a railing? : Total ?6 Click Score: 11 ? ?  ?End of Session Equipment Utilized During Treatment: Gait belt ?Activity Tolerance: Patient limited by fatigue;Other (comment) ?Patient left: in chair;with call bell/phone within reach;with chair alarm set;with family/visitor present ?Nurse Communication: Mobility status ?PT Visit Diagnosis: Unsteadiness on feet (R26.81);History of falling (Z91.81);Difficulty in walking, not elsewhere classified (R26.2);Pain ?  ? ? ?Time: 1355-1420 ?PT Time Calculation (min) (ACUTE ONLY): 25 min ? ?Charges:  $Therapeutic Activity: 8-22 mins          ?          ? ?Debe Coder PT ?Acute Rehabilitation Services ?Pager [phone] ?Office [phone] ? ? ? ?Ivo Moga ?05/31/2021, 4:04 PM ? ?

## 2021-05-31 NOTE — Progress Notes (Signed)
Pharmacy Brief Note - Alvimopan (Entereg)  ?The standing order set for alvimopan (Entereg) now includes an automatic order to discontinue the drug after the patient has had a bowel movement. The change was approved by the [facility] and the Medical Executive Committee.  ? ?This patient has had bowel movements documented by nursing. Therefore, alvimopan has been discontinued. If there are questions, please contact the pharmacy at [phone].  ? ?Thank you- ? ?Dia Sitter, PharmD, BCPS ?05/31/2021 6:50 AM ? ?

## 2021-05-31 NOTE — Progress Notes (Signed)
Occupational Therapy Treatment ?Patient Details ?Name: William Good ?MRN: [id number] ?DOB: 09/02/1955 ?Today's Date: 05/31/2021 ? ? ?History of present illness Pt s/p low anterior rectosigmoid resection, coloanal anastomisis, and diverting loop ileostomy 2* rectal CA.  Pt with CKD, DM, back surgery, R TKR and CKD ?  ?OT comments ? Patient continues to have poor activity tolerance with orthostatic hypotension with standing and generalized weakness. He exhibits heavy breathing with minimal activity though o2 sat in high 90s. Only able to tolerate standing for Bps and transfer to recliner. Co-tx for safety as patient known to become orthostatic. Patient perform grooming and Ub bathing task seated in recliner. Patient breathing heavy with just bathing task. Continue to recommend short term rehab at discharge.  ? ?Recommendations for follow up therapy are one component of a multi-disciplinary discharge planning process, led by the attending physician.  Recommendations may be updated based on patient status, additional functional criteria and insurance authorization. ?   ?Follow Up Recommendations ? Skilled nursing-short term rehab (<3 hours/day)  ?  ?Assistance Recommended at Discharge Frequent or constant Supervision/Assistance  ?Patient can return home with the following ? A little help with walking and/or transfers;A lot of help with bathing/dressing/bathroom;Assistance with cooking/housework;Assist for transportation;Help with stairs or ramp for entrance ?  ?Equipment Recommendations ? None recommended by OT  ?  ?Recommendations for Other Services   ? ?  ?Precautions / Restrictions Precautions ?Precautions: Fall ?Precaution Comments: new ileostomy, JP drain on R ?Restrictions ?Weight Bearing Restrictions: No  ? ? ?  ? ? ?ADL either performed or assessed with clinical judgement  ? ?ADL Overall ADL's : Needs assistance/impaired ?  ?  ?Grooming: Set up;Sitting ?  ?Upper Body Bathing: Set up;Sitting ?Upper Body  Bathing Details (indicate cue type and reason): Performed partial upper body bathing task in recliner ?  ?  ?  ?  ?  ?  ?  ?  ?  ?  ?  ?  ?Functional mobility during ADLs: Rolling walker (2 wheels);+2 for safety/equipment;Minimal assistance ?General ADL Comments: Patient continues to exhibit poor activity tolerance and orthostatic hypotension with standing. Patient fatigued quickly. Breathes heavy with minimal activity. Patient performed ADL task seated in recliner. ?  ? ?Extremity/Trunk Assessment Upper Extremity Assessment ?Upper Extremity Assessment: Overall WFL for tasks assessed ?  ?  ?  ?  ?  ? ?Vision Patient Visual Report: No change from baseline ?  ?  ?Perception   ?  ?Praxis   ?  ? ?Cognition Arousal/Alertness: Awake/alert ?Behavior During Therapy: Flat affect ?Overall Cognitive Status: No family/caregiver present to determine baseline cognitive functioning ?  ?  ?  ?  ?  ?  ?  ?  ?  ?  ?  ?  ?  ?  ?  ?  ?  ?  ?  ?   ?Exercises   ? ?  ?Shoulder Instructions   ? ? ?  ?General Comments    ? ? ?Pertinent Vitals/ Pain       Pain Assessment ?Pain Assessment: No/denies pain ? ?Home Living   ?  ?  ?  ?  ?  ?  ?  ?  ?  ?  ?  ?  ?  ?  ?  ?  ?  ?  ? ?  ?Prior Functioning/Environment    ?  ?  ?  ?   ? ?Frequency ? Min 2X/week  ? ? ? ? ?  ?Progress Toward Goals ? ?OT Goals(current goals  can now be found in the care plan section) ? Progress towards OT goals: Progressing toward goals ? ?Acute Rehab OT Goals ?Patient Stated Goal: go to rehab ?OT Goal Formulation: With patient ?Time For Goal Achievement: 06/11/21 ?Potential to Achieve Goals: Good  ?Plan Discharge plan remains appropriate   ? ?Co-evaluation ? ? ? PT/OT/SLP Co-Evaluation/Treatment: Yes ?Reason for Co-Treatment: For patient/therapist safety ?  ?OT goals addressed during session: ADL's and self-care (activity tolerance) ?  ? ?  ?AM-PAC OT "6 Clicks" Daily Activity     ?Outcome Measure ? ? Help from another person eating meals?: A Little ?Help from another  person taking care of personal grooming?: A Little ?Help from another person toileting, which includes using toliet, bedpan, or urinal?: A Lot ?Help from another person bathing (including washing, rinsing, drying)?: A Lot ?Help from another person to put on and taking off regular upper body clothing?: A Lot ?Help from another person to put on and taking off regular lower body clothing?: A Lot ?6 Click Score: 14 ? ?  ?End of Session Equipment Utilized During Treatment: Rolling walker (2 wheels) ? ?OT Visit Diagnosis: Muscle weakness (generalized) (M62.81) ?  ?Activity Tolerance Patient limited by fatigue ?  ?Patient Left in chair;with call bell/phone within reach ?  ?Nurse Communication Mobility status (BP) ?  ? ?   ? ?Time: 8416-6063 ?OT Time Calculation (min): 23 min ? ?Charges: OT General Charges ?$OT Visit: 1 Visit ?OT Treatments ?$Self Care/Home Management : 8-22 mins ? ?Lebert Lovern, OTR/L ?Acute Care Rehab Services  ?Office [phone] ?Pager: [phone]  ? ?Rece Zechman L Adelynne Joerger ?05/31/2021, 2:46 PM ?

## 2021-05-31 NOTE — Progress Notes (Signed)
[facility] ?Progress Note ? ?Subjective: 3.6 L UOP yesterday total, net neg - 1000 ml yest.  Creat down to 1.99 today.  ? ?Vitals:  ? 05/30/21 0525 05/30/21 1328 05/30/21 2142 05/31/21 0500  ?BP: 128/78 105/69 116/75   ?Pulse: 68 79 74   ?Resp: _0 ?Temp: 98.6 ?F (37 ?C) 98.6 ?F (37 ?C) 98.4 ?F (36.9 ?C)   ?TempSrc: Oral Oral Oral   ?SpO2:  95% 98%   ?Weight:    115.4 kg  ?Height:      ? ? ?Exam: ?Gen alert, no distress ?R chest port is accessed ?No jvd or bruits ?Chest clear bilat to bases ?RRR no RG ?Abd soft ntnd no mass or ascites RLQ ostomy intact, +bs ?GU foley cath in place ?Ext no LE or UE edema ?Neuro is alert, Ox 3 , nf ?  ? Home meds include - xanax prn, asa, norco prn, lotensin 20-12.5 bid, remeron, prilosec, klorcon, pravachol, propranolol ER, risperidone, prns/ vits/ supps ?  ?  ?   Date              Creat               eGFR ?  2008              0.91 ?  2011              1.5 ?  2018              1.4 ?  2022              1.0- 1.47 ?  03/16/21          1.25 ?  04/14/21          1.07 ?  05/14/21          1.25 ?  05/28/21          1.74 ?  05/29/21          1.95 ?  05/30/21          3.11 ?  ?  ?  ? UA 3/25 - 100 prot, rare bact, 6-10 rbc, 0-5 wbc, no epis ?   ?  Renal US 3/24 - IMPRESSION: Redemonstration of right renal cyst without evidence of superimposed hydronephrosis. Greatest dimension estimated 8.2 cm. Left renal cortex not well evaluated and is atrophic/replaced by chronic cystic change/hydronephrosis in the setting of chronic UPJ obstruction described on prior CT. ?   CT abdomen from June 2022 > IMPRESSION - Adrenals/Urinary Tract: R Kidney: There is a large dominant cyst associated with the right kidney which extends into the right renal pelvis.[name] KitchenMarland KitchenMarland KitchenNo ?hydronephrosis or perinephric stranding. Delayed imaging ?demonstrates no pelvicaliectasis or filling defects in the opacified ?portions of the upper collecting system. The ureter is normal. The bladder is normal. L Kidney: Multiple  cysts are associated with the left kidney. There also appears to be pelvicaliectasis on the left with regions of cortical thinning. There is only minimal excretion (of dye) on the left on delayed imaging making it difficult to differentiate between cysts and dilated calices. The left ureter is normal..[name] KitchenMarland KitchenSummary: There are cystic changes associated with the left kidney as well. However, I believe there is also hydronephrosis with regions of cortical thinning and an abrupt transition at the left UPJ. The findings on the left suggest a chronic left UPJ obstruction. ?  ?Assessment/ Plan: ?AKI - b/l creatinine 1.0- 1.4 from 2022, eGFR 52 - >  9m/min . Creat here was 1.7 on admission, then after his surgery increased to 1.9 then 3.1 on 3/25. Renal [place] an atrophic L kidney (w/ hx of chronic UPJ obstruction/ hydronephrosis as per CT abd mid 2022) with no hydro on the R side. Essentially he has a solitary R kidney by imaging. Pt had AKI and also bladder retention after foley removed 3/24. With replaced foley and IVF"s creat down today to 1.9. Will lower IVF"s a bit, okay to dc if eating/ drinking good. Should have urology involved at some point (bladder retention and #3 below). Will arrange [facility] f/u for his CKD.  No other suggestions. Will sign off.  ?Rectal cancer - sp elective surgery on 3/22.  ?Bladder retention - foley replaced, improved; as above ?Hx of L hydronephrosis - by CT June 2022, and by renal [place].  Not sure if he has ever seen a urologist for this. He doesn't know and family yesterday didn't know either. Urology was involved here to assist w/ identifying ureters during the surgery.  ?HTN - getting home propranolol, BP's good. No hypotension. Okay to resume home lotensin when ready for dc and/or when AKI has resolved. ?DM2 ?Bipolar d/o  ?  ?  ?  ? ? ? ? ?William Good ?05/31/2021, 6:09 AM ? ? ?Recent Labs  ?Lab 05/29/21 ?0312 05/30/21 ?0306 05/30/21 ?2020 05/31/21 ?0307  ?HGB 12.4*  --   --  8.2*   ?ALBUMIN 3.3* 3.1*  --   --   ?CALCIUM 7.6* 7.6*  7.6* 7.5* 7.5*  ?PHOS 4.8* 4.3  --  2.9  ?CREATININE 1.95* 3.11*  3.16* 2.44* 1.99*  ?K 4.1 4.6  4.6 4.4 4.1  ? ?Inpatient medications: ? acetaminophen  1,000 mg Oral Q6H  ? ALPRAZolam  1 mg Oral BID  ? alvimopan  12 mg Oral BID  ? aspirin EC  81 mg Oral Daily  ? Chlorhexidine Gluconate Cloth  6 each Topical Once  ? cholecalciferol  2,000 Units Oral Daily  ? enoxaparin (LOVENOX) injection  40 mg Subcutaneous Q24H  ? feeding supplement (GLUCERNA SHAKE)  237 mL Oral BID BM  ? insulin aspart  0-15 Units Subcutaneous TID WC  ? insulin aspart  0-5 Units Subcutaneous QHS  ? insulin aspart  2 Units Subcutaneous TID WC  ? lip balm  1 application. Topical BID  ? pantoprazole  40 mg Oral Q1200  ? polycarbophil  625 mg Oral BID  ? potassium chloride SA  20 mEq Oral Daily  ? pravastatin  40 mg Oral Daily  ? propranolol ER  160 mg Oral QHS  ? risperiDONE  0.25 mg Oral QHS  ? sodium chloride flush  10-40 mL Intracatheter Q12H  ? ? sodium chloride 75 mL/hr at 05/30/21 1230  ? ?albuterol, alum & mag hydroxide-simeth, diphenhydrAMINE **OR** diphenhydrAMINE, HYDROmorphone (DILAUDID) injection, loperamide, magic mouthwash, melatonin, ondansetron **OR** ondansetron (ZOFRAN) IV, prochlorperazine **OR** prochlorperazine, simethicone, sodium chloride flush, SUMAtriptan, traMADol ? ? ? ? ? ? ?

## 2021-05-31 NOTE — Progress Notes (Signed)
? ?GREGREY BLOYD ?[id number] ?04-Oct-1955 ? ?CARE TEAM: ? ?PCP: Aura Dials, MD ? ?Outpatient Care Team: Patient Care Team: ?Aura Dials, MD as PCP - General (Family Medicine) ?Michael Boston, MD as Consulting Physician (General Surgery) ?Otis Brace, MD as Consulting Physician (Gastroenterology) ?Ladell Pier, MD as Consulting Physician (Oncology) ?Kyung Rudd, MD as Consulting Physician (Radiation Oncology) ?Earl Gala, Deliah Goody, RN as Equities trader (Oncology) ?Larey Dresser, MD as Consulting Physician (Cardiology) ?Lucas Mallow, MD as Consulting Physician (Urology) ? ?Inpatient Treatment Team: Treatment Team: Attending Provider: Michael Boston, MD; [place] Nurse: McNichol, Rudi Heap, RN; Consulting Physician: Ladell Pier, MD; Rounding Team: Garner Gavel, MD; Consulting Physician: Charlynne Cousins, MD; Physical Therapist: Mathis Fare, PT; Consulting Physician: Roney Jaffe, MD; Charge Nurse: Sunny Schlein, RN; Utilization Review: Alease Medina, RN; Physician Assistant: Feliz Beam; Registered Nurse: Dorinda Hill, RN; Utilization Review: Claudie Leach, RN; Occupational Therapist: Lenward Chancellor, OT ? ? ?Problem List:  ? ?Principal Problem: ?  Rectal cancer ([place]) ?Active Problems: ?  Chronic kidney disease, stage 3 unspecified ([place]) ?  AKI (acute kidney injury) ([place]) ?  Essential hypertension ?  Migraine ?  Type 2 diabetes mellitus with complication, without long-term current use of insulin ([place]) ?  Bipolar disorder ([place]) ?  Polycystic kidney ?  Coronary artery calcification ?  High output ileostomy ([place]) ? ? ?4 Days Post-Op  05/27/2021 ? ?POST-OPERATIVE DIAGNOSIS:  DISTAL RECTAL CANCER ?  ?PROCEDURE:   ?-ROBOTIC LOW ANTERIOR RECTOSIGMOID RESECTION ?-VERY LOW COLOANAL ANASTOMOSIS ?-DIVERTING LOOP ILEOSTOMY ?-INTRAOPERATIVE ASSESSMENT OF TISSUE VASCULAR PERFUSION USING ICG (indocyanine green) IMMUNOFLUORESCENCE ?-TRANSVERSUS ABDOMINIS PLANE (TAP)  BLOCK - BILATERAL ?-RIGID PROCTOSCOPY ?  ?SURGEON:  Adin Hector, MD ? ?OR FINDINGS:  ?  ?Patient had fibrous scarring in the distal rectum 4-7 cm from the anal verge.  Some thin stricturing.  Consistent with pretty good pathological response compared to the very bulky tumor at first.  Very fibrotic and narrow pelvis with bulky mesorectum. ?  ?No obvious metastatic disease on visceral parietal peritoneum or liver. ?  ?The anastomosis rests 2 cm from the anal verge by rigid proctoscopy.  There is a 31 EEA mid descending colon to anal rectal margin stump ? ?###################################### ? ?Preoperative diagnosis:  ?1. Ureteral identification for pelvic surgery ?  ?Postoperative diagnosis: ?1. same ?  ?Procedure(s): ?1. Cystoscopy with bilateral ureteral catheterization for firefly injection ?  ?Surgeon: Dr. Donald Pore ?  ? ? ? ?Assessment ? ?Recovering ? ?(Hospital Stay = 4 days) ? ?Plan: ? ?ERAS enhance recovery pathway ? ?Feeling more bloated on high output ileostomy regimen.  Output tapered down.  Holding iron and Imodium.  Make Imodium as needed.  Continue fiber.  Bloating should resolve. ? ?Okay to try solid diet. ? ?AKA in the setting of chronic kidney disease at least 3.  Patient nonoliguric with better urine output but creatinine increased.  Nephrology is seen.  Foley replaced with significant return.  Wonder if that is a factor as well.  Creatinine trending back down Continue to avoid NSAIDs, balance between lower IV fluids from a cardiopulmonary standpoint per cardiology wishes without getting toe dry that renal function worsens.  Hopefully we can thread the needle. ? ?Foley replaced with over a liter of return consistent with urinary retention.  Most likely keep in place for 3 days and reattempt Foley removal.  Defer to nephrology. ? ?Suspect he would benefit from 6 weeks of intermittent IV fluid boluses q.  Monday Wednesday Friday through the Port-A-Cath to avoid dehydration at  discharge. ? ?Diabetic control.  Sliding scale insulin started .  We will ask medicine to have input to help start regimen since he is not on anything.  Called [facility] - Dr Olevia Bowens most likely the one to help see & offer recs  ? ?Follow-up on pathology. ? ?Patient had small EEA anastomotic leak controlled with drain and planned loop ileostomy diversion.  Drain looks serosanguineous today compared to slightly thinly feculent yesterday = probably already sealed up.   Hopeful sign.  Follow.  Keep drain for several weeks. ? ?Ileostomy care and training. ? ?Have involvement with physical, occupational therapies.  Also case management since patient apparently lives at home but is borderline functioning/independent and brother most likely does not feel comfortable managing patient by himself.  Seems to be rather deconditioned.  I believe he will require a SNF.  Impression his family is rather overwhelmed on trying to help take care of him.  Current therapies agree with that.  They will help follow. ? ?-VTE prophylaxis- SCDs, etc ? ?-mobilize as tolerated to help recovery.  Patient feeling a little wiped out.  Keep trying to mobilize as tolerated. ? ?Disposition:  ?Disposition:  ?The patient is from: Home ? ?Anticipate discharge to:  [facility] (SNF) ? ?Anticipated Date of Discharge is:  March 29,2023 ?  ? ?Barriers to discharge:  Pending Clinical improvement (more likely than not) ? ?Patient currently is NOT MEDICALLY STABLE for discharge from the hospital from a surgery standpoint. ? ? ? ? ? ?I reviewed nursing notes, last 24 h vitals and pain scores, last 48 h intake and output, last 24 h labs and trends, and last 24 h imaging results. I have reviewed this patient's available data, including medical history, events of note, test results, etc as part of my evaluation.  A significant portion of that time was spent in counseling.  Care during the described time interval was provided by me. ? ?This care required  moderate level of medical decision making.  05/31/2021 ? ? ? ?Subjective: ?(Chief complaint) ? ?Seen by nephrology. ? ?Foley replaced with over 1000 mL of urine return. ? ?Patient with some crampiness but improved overall. ? ?Not much appetite. ? ?Objective: ? ?Vital signs: ? ?Vitals:  ? 05/30/21 1328 05/30/21 2142 05/31/21 0500 05/31/21 7371  ?BP: 105/69 116/75  112/71  ?Pulse: 79 74  85  ?Resp: '18 18  16  '$ ?Temp: 98.6 ?F (37 ?C) 98.4 ?F (36.9 ?C)  (!) 97.5 ?F (36.4 ?C)  ?TempSrc: Oral Oral  Oral  ?SpO2: 95% 98%  98%  ?Weight:   115.4 kg   ?Height:      ? ? ?Last BM Date : 05/30/21 ? ?Intake/Output  ? ?Yesterday: ? 03/25 0701 - 03/26 0700 ?In: 39 [P.O.:1300; I.V.:2510] ?Out: 4865 [GGYIR:4854; Drains:350; Stool:775] ?This shift: ? No intake/output data recorded. ? ?Bowel function: ? Flatus: YES ? BM:  YES ? Drain: Thinly serosanguineous.  No feculent tinged ? ?Physical Exam: ? ?General: Pt sleeping comfortably but easily awakened in no acute distress.  Looks a little tired and spacey which is his baseline. ?Eyes: PERRL, normal EOM.  Sclera clear.  No icterus ?Neuro: CN II-XII intact w/o focal sensory/motor deficits. ?Lymph: No head/neck/groin lymphadenopathy ?Psych:  No delerium/psychosis/paranoia.  Oriented x 4 ?HENT: Normocephalic, Mucus membranes moist.  No thrush ?Neck: Supple, No tracheal deviation.  No obvious thyromegaly ?Chest: No pain to chest wall compression.  Good respiratory excursion.  No audible wheezing ?CV:  Pulses intact.  Regular rhythm.  No major extremity edema ?MS: Normal AROM mjr joints.  No obvious deformity ? ?Abdomen: Obese Soft.  Mildy distended.  Nontender.  No evidence of peritonitis.  No incarcerated hernias. ?Ileostomy right abdomen pink with ##gas and thin dark green effluent in bag.   ? ?Ext:   No deformity.  No mjr edema.  No cyanosis ?Skin: No petechiae / purpurea.  No major sores.  Warm and dry ? ? ? ?Results:  ? ?Cultures: ?No results found for this or any previous visit (from  the past 720 hour(s)). ? ?Labs: ?Results for orders placed or performed during the hospital encounter of 05/27/21 (from the past 48 hour(s))  ?Glucose, capillary     Status: Abnormal  ? Collection Time: 05/29/21 11:44 AM  ?

## 2021-05-31 NOTE — Progress Notes (Addendum)
[facility] ?CONSULT PROGRESS NOTE ? ? ? ?Progress Note  ?William Good  SWN:[id number] DOB: 06/25/1955 DOA: 05/27/2021 ?PCP: Aura Dials, MD  ? ? ? ?Brief Narrative:  ? ?William Good is an 66 y.o. male past medical history of chronic back pain, bipolar disorder, stage II chronic kidney disease and diabetes mellitus type 2, history of rectal cancer underwent robotic anterior rectosigmoid resection with very low coloanal anastomosis and diverting colostomy tube were evaluating for uncontrolled diabetes mellitus and chronic kidney disease ? ? ?Assessment/Plan:  ? ?Rectal cancer [facility]): ?Underwent surgery yesterday continue postop management per general surgery Dr. Johney Maine. ?Use narcotics for analgesic ? ?Acute kidney injury  ON CKD II: ?Renal function has improved ?Renal has been consulted. ?Continue to hold ACE inhibitor and diuretic therapy per renal. ?Creatinine this morning around 1.9, creatinine usually runs around 1.7. ?Creatinine decreased close to baseline. ?We will go ahead and sign off. ?Renal to dictate when to start ACE inhibitor and diuretic therapy. ? ?Essential hypertension: ?Continue propanolol. ?Holding ACE inhibitor and diuretic therapy. ?Agree with IV metoprolol, avoid ACE inhibitor IV or oral. ? ? ?Migraine: ?Continue propranolol sumatriptan and other analgesics. ? ?Diabetes mellitus type 2 with long-term use of insulin: ?With an A1c of 7.3. ?Continue carb modified diet. ?Blood glucose fairly controlled on sliding scale.  Continue sliding scale while in house. ?Upon discharge, he can be sent home on metformin p.o. twice daily 500 mg ? ?Bipolar disorder: ?Continue mirtazapine, risperidone and alprazolam. ? ?CAD: ?Continue aspirin, statins and beta-blockers. ? ? ?DVT prophylaxis: lovenox ?Family Communication:none ? ? ? ?Code Status:  ? ?  ?Code Status Orders  ?(From admission, onward)  ?  ? ? ?  ? ?  Start     Ordered  ? 05/27/21 1540  Full code  Continuous       ? 05/27/21 1539   ? ?  ?  ? ?  ? ?Code Status History   ? ? This patient has a current code status but no historical code status.  ? ?  ? ? ? ? ?IV Access:  ? ?Peripheral IV ? ? ?Procedures and diagnostic studies:  ? ?No results found. ? ? ?Medical Consultants:  ? ?None. ? ? ?Subjective:  ? ? ?William Good relates his pain is controlled. ? ?Objective:  ? ? ?Vitals:  ? 05/30/21 1328 05/30/21 2142 05/31/21 0500 05/31/21 9381  ?BP: 105/69 116/75  112/71  ?Pulse: 79 74  85  ?Resp: '18 18  16  '$ ?Temp: 98.6 ?F (37 ?C) 98.4 ?F (36.9 ?C)  (!) 97.5 ?F (36.4 ?C)  ?TempSrc: Oral Oral  Oral  ?SpO2: 95% 98%  98%  ?Weight:   115.4 kg   ?Height:      ? ?SpO2: 98 % ?O2 Flow Rate (L/min): 2 L/min ? ? ?Intake/Output Summary (Last 24 hours) at 05/31/2021 0944 ?Last data filed at 05/31/2021 8299 ?Gross per 24 hour  ?Intake 3810 ml  ?Output 4865 ml  ?Net -1055 ml  ? ? ?Filed Weights  ? 05/27/21 1536 05/28/21 0537 05/31/21 0500  ?Weight: 108.9 kg 106.1 kg 115.4 kg  ? ? ?Exam: ?General exam: In no acute distress. ?Respiratory system: Good air movement and clear to auscultation. ?Cardiovascular system: S1 & S2 heard, RRR. No JVD. ?Gastrointestinal system: Abdomen is nondistended, soft and nontender.  ?Extremities: No pedal edema. ?Skin: No rashes, lesions or ulcers ?Psychiatry: Judgement and insight appear normal. Mood & affect appropriate. ? ? ?Data Reviewed:  ? ? ?Labs: ?Basic Metabolic  Panel: ?Recent Labs  ?Lab 05/28/21 ?0400 05/29/21 ?0312 05/30/21 ?0306 05/30/21 ?2020 05/31/21 ?0307  ?NA 134* 133* 131*  130* 132* 132*  ?K 4.4 4.1 4.6  4.6 4.4 4.1  ?CL 104 104 103  102 104 103  ?CO2 23 23 21*  '22 22 22  '$ ?GLUCOSE 193* 179* 195*  197* 218* 187*  ?BUN 28* 30* 47*  45* 40* 36*  ?CREATININE 1.74* 1.95* 3.11*  3.16* 2.44* 1.99*  ?CALCIUM 7.8* 7.6* 7.6*  7.6* 7.5* 7.5*  ?MG 1.5*  --   --   --  1.9  ?PHOS  --  4.8* 4.3  --  2.9  ? ? ?GFR ?Estimated Creatinine Clearance: 47.9 mL/min (A) (by C-G formula based on SCr of 1.99 mg/dL (H)). ?Liver Function  Tests: ?Recent Labs  ?Lab 05/28/21 ?0400 05/29/21 ?0312 05/30/21 ?0306  ?AST 43*  --   --   ?ALT 39  --   --   ?ALKPHOS 69  --   --   ?BILITOT 0.4  --   --   ?PROT 5.8*  --   --   ?ALBUMIN 3.1* 3.3* 3.1*  ? ? ?No results for input(s): LIPASE, AMYLASE in the last 168 hours. ?No results for input(s): AMMONIA in the last 168 hours. ?Coagulation profile ?No results for input(s): INR, PROTIME in the last 168 hours. ?COVID-19 Labs ? ?No results for input(s): DDIMER, FERRITIN, LDH, CRP in the last 72 hours. ? ?No results found for: SARSCOV2NAA ? ?CBC: ?Recent Labs  ?Lab 05/28/21 ?0400 05/29/21 ?0312 05/31/21 ?0307  ?WBC 9.7  --  10.3  ?HGB 13.5 12.4* 8.2*  ?HCT 38.8*  --  24.2*  ?MCV 97.5  --  98.0  ?PLT 124*  --  121*  ? ? ?Cardiac Enzymes: ?No results for input(s): CKTOTAL, CKMB, CKMBINDEX, TROPONINI in the last 168 hours. ?BNP (last 3 results) ?No results for input(s): PROBNP in the last 8760 hours. ?CBG: ?Recent Labs  ?Lab 05/30/21 ?0749 05/30/21 ?1148 05/30/21 ?1625 05/30/21 ?2134 05/31/21 ?0725  ?GLUCAP 193* 198* 172* 203* 181*  ? ? ?D-Dimer: ?No results for input(s): DDIMER in the last 72 hours. ?Hgb A1c: ?No results for input(s): HGBA1C in the last 72 hours. ?Lipid Profile: ?No results for input(s): CHOL, HDL, LDLCALC, TRIG, CHOLHDL, LDLDIRECT in the last 72 hours. ?Thyroid function studies: ?No results for input(s): TSH, T4TOTAL, T3FREE, THYROIDAB in the last 72 hours. ? ?Invalid input(s): FREET3 ?Anemia work up: ?No results for input(s): VITAMINB12, FOLATE, FERRITIN, TIBC, IRON, RETICCTPCT in the last 72 hours. ?Sepsis Labs: ?Recent Labs  ?Lab 05/28/21 ?0400 05/31/21 ?0307  ?WBC 9.7 10.3  ? ? ?Microbiology ?No results found for this or any previous visit (from the past 240 hour(s)). ? ? ?Medications:  ? ? acetaminophen  1,000 mg Oral Q6H  ? ALPRAZolam  1 mg Oral BID  ? aspirin EC  81 mg Oral Daily  ? Chlorhexidine Gluconate Cloth  6 each Topical Once  ? cholecalciferol  2,000 Units Oral Daily  ? enoxaparin  (LOVENOX) injection  40 mg Subcutaneous Q24H  ? feeding supplement (GLUCERNA SHAKE)  237 mL Oral BID BM  ? insulin aspart  0-15 Units Subcutaneous TID WC  ? insulin aspart  0-5 Units Subcutaneous QHS  ? insulin aspart  2 Units Subcutaneous TID WC  ? lip balm  1 application. Topical BID  ? pantoprazole  40 mg Oral Q1200  ? polycarbophil  625 mg Oral BID  ? potassium chloride SA  20 mEq Oral Daily  ?  pravastatin  40 mg Oral Daily  ? propranolol ER  160 mg Oral QHS  ? risperiDONE  0.25 mg Oral QHS  ? sodium chloride flush  10-40 mL Intracatheter Q12H  ? ?Continuous Infusions: ? sodium chloride 75 mL/hr at 05/30/21 1230  ? ? ? ? LOS: 4 days  ? ?Tammi Klippel Aileen Fass  ?[facility] ? ?05/31/2021, 9:44 AM  ? ? ? ? ? ? ?

## 2021-06-01 LAB — GLUCOSE, CAPILLARY
Glucose-Capillary: 142 mg/dL — ABNORMAL HIGH (ref 70–99)
Glucose-Capillary: 133 mg/dL — ABNORMAL HIGH (ref 70–99)
Glucose-Capillary: 178 mg/dL — ABNORMAL HIGH (ref 70–99)
Glucose-Capillary: 184 mg/dL — ABNORMAL HIGH (ref 70–99)

## 2021-06-01 LAB — BASIC METABOLIC PANEL
Anion gap: 7 (ref 5–15)
BUN: 28 mg/dL — ABNORMAL HIGH (ref 8–23)
CO2: 23 mmol/L (ref 22–32)
Calcium: 7.7 mg/dL — ABNORMAL LOW (ref 8.9–10.3)
Chloride: 106 mmol/L (ref 98–111)
Creatinine, Ser: 1.69 mg/dL — ABNORMAL HIGH (ref 0.61–1.24)
GFR, Estimated: 44 mL/min — ABNORMAL LOW (ref >60)
Glucose, Bld: 134 mg/dL — ABNORMAL HIGH (ref 70–99)
Potassium: 3.7 mmol/L (ref 3.5–5.1)
Sodium: 136 mmol/L (ref 135–145)

## 2021-06-01 MED ORDER — LOPERAMIDE HCL 2 MG PO CAPS
2.0000 mg | ORAL_CAPSULE | Freq: Every day | ORAL | Status: DC
  Administered 2021-06-01: 2 mg via ORAL
  Filled 2021-06-01: qty 1

## 2021-06-01 MED ORDER — MIRTAZAPINE 15 MG PO TABS
45.0000 mg | ORAL_TABLET | Freq: Every day | ORAL | Status: DC
  Administered 2021-06-01 – 2021-06-04 (×4): 45 mg via ORAL
  Filled 2021-06-01 (×4): qty 3

## 2021-06-01 MED ORDER — LACTATED RINGERS IV BOLUS
1000.0000 mL | Freq: Every day | INTRAVENOUS | Status: DC
  Administered 2021-06-01 – 2021-06-05 (×5): 1000 mL via INTRAVENOUS

## 2021-06-01 MED ORDER — FERROUS SULFATE 325 (65 FE) MG PO TABS
325.0000 mg | ORAL_TABLET | Freq: Two times a day (BID) | ORAL | Status: DC
  Administered 2021-06-01 – 2021-06-05 (×9): 325 mg via ORAL
  Filled 2021-06-01 (×8): qty 1

## 2021-06-01 NOTE — Consult Note (Addendum)
[place] Nurse ostomy follow up ?Stoma type/location: RLQ loop ileostomy ?Stomal assessment/size: oblong, 2 and 1/4 inches x 1 and 5/8 inches. Proximal os with lumen at 6 o'clock; red, moist, edematous, blood clot at medial edge of stoma at loop formation ?Peristomal assessment: intact, friable (bleeding with cleansing) ?Treatment options for stomal/peristomal skin: Direct pressure to stop bleeding, then skin barrier ring. ?Output: <20 mls in pouch at time of changing. ?Ostomy pouching: 2pc. 2 and 3/4 inch pouching system with skin barrier ring.  ?Education provided:  ?None. Patient reports he did not sleep well last night and that he is cold. I provide a warm blanket for him and adjust the temperature in the room. As I talk through the pouch change, patient falls asleep and begins snoring loudly.  ?Enrolled patient in [place] Start Discharge program: Yes ? ?Note: I am not certain if patient will require a one-piece convex ostomy pouching system with skin barrier ring or if a 2-piece 2 and 3/4 inch system will meet his needs at this time. Three of each type of pouching is collected and placed in a bag in the patient's room.  ? ?I see in previous provider and Rehab notes that patient is likely to require skilled nursing facility/short term rehab. Recommend involvement of TOC. ? ?Next scheduled pouch change is Thursday, March 30. ? ?[place] nursing team will follow along with you, and will remain available to this patient, the nursing, surgical and medical teams.   ?Thanks, ?Maudie Flakes, MSN, RN, [place], [place], CWON-AP, [place]  ?Pager# [phone]  ? ? ? ?  ?

## 2021-06-01 NOTE — TOC Progression Note (Signed)
Transition of Care (TOC) - Progression Note  ? ? ?Patient Details  ?Name: NYAL SCHACHTER ?MRN: [id number] ?Date of Birth: 1955-05-14 ? ?Transition of Care (TOC) CM/SW Contact  ?Evarose Altland, LCSW ?Phone Number: ?06/01/2021, 3:21 PM ? ?Clinical Narrative:    ?Continue to await completion of Level 2 PASRR and medical stability. ? ? ?Expected Discharge Plan: [place] ?Barriers to Discharge: Continued Medical Work up, Ship broker, [place] (PASRR), SNF Pending bed offer ? ?Expected Discharge Plan and Services ?Expected Discharge Plan: [place] ?In-house Referral: Clinical Social Work ?  ?  ?Living arrangements for the past 2 months: [place] ?                ?DME Arranged: N/A ?DME Agency: NA ?  ?  ?  ?  ?  ?  ?  ?  ? ? ?Social Determinants of Health (SDOH) Interventions ?  ? ?Readmission Risk Interventions ? ?  05/29/2021  ?  2:21 PM  ?Readmission Risk Prevention Plan  ?Transportation Screening Complete  ?PCP or Specialist Appt within 3-5 Days Complete  ?[place] or Home Care Consult Complete  ?Social Work Consult for [place] Planning/Counseling Complete  ?Palliative Care Screening Not Applicable  ? ? ?

## 2021-06-01 NOTE — Progress Notes (Signed)
? ?William Good ?[id number] ?[birth year] ? ?CARE TEAM: ? ?PCP: Aura Dials, MD ? ?Outpatient Care Team: Patient Care Team: ?Aura Dials, MD as PCP - General (Family Medicine) ?Michael Boston, MD as Consulting Physician (General Surgery) ?Otis Brace, MD as Consulting Physician (Gastroenterology) ?Ladell Pier, MD as Consulting Physician (Oncology) ?Kyung Rudd, MD as Consulting Physician (Radiation Oncology) ?Earl Gala, Deliah Goody, RN as Equities trader (Oncology) ?Larey Dresser, MD as Consulting Physician (Cardiology) ?Lucas Mallow, MD as Consulting Physician (Urology) ? ?Inpatient Treatment Team: Treatment Team: Attending Provider: Michael Boston, MD; [place] Nurse: Illene Regulus, RN; Consulting Physician: Ladell Pier, MD; Rounding Team: Garner Gavel, MD; Consulting Physician: Charlynne Cousins, MD; Technician: Abbe Amsterdam, NT; Charge Nurse: Sunny Schlein, RN ? ? ?Problem List:  ? ?Principal Problem: ?  Rectal cancer ([place]) ?Active Problems: ?  Chronic kidney disease, stage 3 unspecified ([place]) ?  AKI (acute kidney injury) ([place]) ?  Essential hypertension ?  Migraine ?  Type 2 diabetes mellitus with complication, without long-term current use of insulin ([place]) ?  Bipolar disorder ([place]) ?  Polycystic kidney ?  Coronary artery calcification ?  High output ileostomy ([place]) ? ? ?5 Days Post-Op  05/27/2021 ? ?POST-OPERATIVE DIAGNOSIS:  DISTAL RECTAL CANCER ?  ?PROCEDURE:   ?-ROBOTIC LOW ANTERIOR RECTOSIGMOID RESECTION ?-VERY LOW COLOANAL ANASTOMOSIS ?-DIVERTING LOOP ILEOSTOMY ?-INTRAOPERATIVE ASSESSMENT OF TISSUE VASCULAR PERFUSION USING ICG (indocyanine green) IMMUNOFLUORESCENCE ?-TRANSVERSUS ABDOMINIS PLANE (TAP) BLOCK - BILATERAL ?-RIGID PROCTOSCOPY ?  ?SURGEON:  Adin Hector, MD ? ?OR FINDINGS:  ?  ?Patient had fibrous scarring in the distal rectum 4-7 cm from the anal verge.  Some thin stricturing.  Consistent with pretty good pathological response compared to the very bulky  tumor at first.  Very fibrotic and narrow pelvis with bulky mesorectum. ?  ?No obvious metastatic disease on visceral parietal peritoneum or liver. ?  ?The anastomosis rests 2 cm from the anal verge by rigid proctoscopy.  There is a 31 EEA mid descending colon to anal rectal margin stump ? ?###################################### ? ?Preoperative diagnosis:  ?1. Ureteral identification for pelvic surgery ?  ?Postoperative diagnosis: ?1. same ?  ?Procedure(s): ?1. Cystoscopy with bilateral ureteral catheterization for firefly injection ?  ?Surgeon: Dr. Donald Pore ?  ? ? ? ?Assessment ? ?Recovering ? ?(Hospital Stay = 5 days) ? ?Plan: ? ?ERAS enhance recovery pathway ? ?Ileostomy output back up and thinner.  Restart iron.  Just do Imodium at bedtime scheduled but have as needed backup.  Continue fiber.  Follow.   ? ?Solid diet.  Calorie counts.  I worry his p.o. intake will be fair and he is at risk for getting dehydrated again.  We will see. ? ?AKI in the setting of CKD3 ?-Most likely combination of dehydration-but ileostomy and urinary retention ?-Remove Foley catheter at midnight tonight, 3 days since Foley placed.  If cannot void then replace Foley and have urology follow-up for urinary studies in a few weeks. ?-Avoiding nephrotoxic agents ?-Nephrology saw over the weekend and is already signed off yesterday.  Reconsult if not better.  Hopefully less likely ? ?Follow off standing IV fluids and transition to 6 weeks of intermittent IV fluid boluses q. Monday Wednesday Friday through the Port-A-Cath to avoid dehydration at discharge. ? ?Diabetic control.  Sliding scale insulin started .  We will ask medicine to have input to help start regimen since he is not on anything.  Called [facility] - Dr Olevia Bowens most likely the one to  help see & offer recs  ? ?Follow-up on pathology. ? ?Patient had small EEA anastomotic leak controlled with drain and planned loop ileostomy diversion.  Drain looks serosanguineous today compared to  slightly thinly feculent yesterday = probably already sealed up.   Hopeful sign.  Follow.  Keep drain for several weeks. ? ?Ileostomy care and training. ? ?Have involvement with physical, occupational therapies.  Also case management since patient apparently lives at home but is borderline functioning/independent and brother most likely does not feel comfortable managing patient by himself.  Seems to be rather deconditioned.  I believe he will require a SNF.  Impression his family is rather overwhelmed on trying to help take care of him.  Current therapies agree with that.  They will help follow. ? ?-VTE prophylaxis- SCDs, etc ? ?-mobilize as tolerated to help recovery.  Patient feeling a little wiped out.  Keep trying to mobilize as tolerated. ? ?Disposition:  ?Disposition:  ?The patient is from: Home ? ?Anticipate discharge to:  [facility] (SNF) ? ?Anticipated Date of Discharge is:  March 28,2023 ?  ? ?Barriers to discharge:  Pending Clinical improvement (more likely than not) ? ?Patient currently is NOT MEDICALLY STABLE for discharge from the hospital from a surgery standpoint. ? ? ? ? ? ?I reviewed nursing notes, last 24 h vitals and pain scores, last 48 h intake and output, last 24 h labs and trends, and last 24 h imaging results. I have reviewed this patient's available data, including medical history, events of note, test results, etc as part of my evaluation.  A significant portion of that time was spent in counseling.  Care during the described time interval was provided by me. ? ?This care required moderate level of medical decision making.  06/01/2021 ? ? ? ?Subjective: ?(Chief complaint) ? ?Creatinine normalized.  Nephrology signed off. ? ?Patient with less nausea and bloating. ? ?Does not feel like much of an appetite. ? ?Trying to get up but still feels rather weak. ? ?Objective: ? ?Vital signs: ? ?Vitals:  ? 05/31/21 7026 05/31/21 1251 05/31/21 2036 06/01/21 0507  ?BP: 112/71 128/78  121/79 (!) 142/84  ?Pulse: 85 86 91 88  ?Resp: '16 18 18 17  '$ ?Temp: (!) 97.5 ?F (36.4 ?C)  98.7 ?F (37.1 ?C) 98.3 ?F (36.8 ?C)  ?TempSrc: Oral  Oral Oral  ?SpO2: 98% 98% 99% 95%  ?Weight:    109.1 kg  ?Height:      ? ? ?Last BM Date : 05/31/21 ? ?Intake/Output  ? ?Yesterday: ? 03/26 0701 - 03/27 0700 ?In: 2645 [P.O.:1420; I.V.:1225] ?Out: 3785 [Urine:2250; Drains:480; Stool:750] ?This shift: ? Total I/O ?In: 65 [P.O.:220; I.V.:600] ?Out: 1370 [Urine:1100; Drains:120; Stool:150] ? ?Bowel function: ? Flatus: YES ? BM:  YES -thin dark brown succus effluent ? Drain: Thinly serosanguineous.  No feculent tinged ? ?Physical Exam: ? ?General: Pt sleeping comfortably but easily awakened in no acute distress.  Looks a little tired and spacey which is his baseline. ?Eyes: PERRL, normal EOM.  Sclera clear.  No icterus ?Neuro: CN II-XII intact w/o focal sensory/motor deficits. ?Lymph: No head/neck/groin lymphadenopathy ?Psych:  No delerium/psychosis/paranoia.  Oriented x 4 ?HENT: Normocephalic, Mucus membranes moist.  No thrush ?Neck: Supple, No tracheal deviation.  No obvious thyromegaly ?Chest: No pain to chest wall compression.  Good respiratory excursion.  No audible wheezing ?CV:  Pulses intact.  Regular rhythm.  No major extremity edema ?MS: Normal AROM mjr joints.  No obvious deformity ? ?Abdomen: Obese Soft.  Mildy distended.  Nontender.  Incisions clean dry and intact.  No evidence of peritonitis.  No incarcerated hernias. ?Ileostomy right abdomen pink with ##gas and thin dark green effluent in bag.   ? ?Ext:   No deformity.  No mjr edema.  No cyanosis ?Skin: No petechiae / purpurea.  No major sores.  Warm and dry ? ? ? ?Results:  ? ?Cultures: ?No results found for this or any previous visit (from the past 720 hour(s)). ? ?Labs: ?Results for orders placed or performed during the hospital encounter of 05/27/21 (from the past 48 hour(s))  ?Glucose, capillary     Status: Abnormal  ? Collection Time: 05/30/21  7:49 AM   ?Result Value Ref Range  ? Glucose-Capillary 193 (H) 70 - 99 mg/dL  ?  Comment: Glucose reference range applies only to samples taken after fasting for at least 8 hours.  ?Glucose, capillary     Status: Abnormal

## 2021-06-01 NOTE — Progress Notes (Signed)
Initial Nutrition Assessment ? ?DOCUMENTATION CODES:  ? ?Obesity unspecified ? ?INTERVENTION:  ? ?-Glucerna Shake po TID, each supplement provides 220 kcal and 10 grams of protein  ? ?-48 hour Calorie count ordered per surgery ? ?NUTRITION DIAGNOSIS:  ? ?Increased nutrient needs related to post-op healing, cancer and cancer related treatments as evidenced by estimated needs. ? ?GOAL:  ? ?Patient will meet greater than or equal to 90% of their needs ? ?MONITOR:  ? ?PO intake, Supplement acceptance, I & O's, Weight trends, Labs ? ?REASON FOR ASSESSMENT:  ? ?Consult ?Calorie Count ? ?ASSESSMENT:  ? ?66 y.o. male past medical history of chronic back pain, bipolar disorder, stage II chronic kidney disease and diabetes mellitus type 2, history of rectal cancer underwent robotic anterior rectosigmoid resection with very low coloanal anastomosis and diverting colostomy tube were evaluating for uncontrolled diabetes mellitus and chronic kidney disease ? ?3/22: s/p rectosigmoid resection, ileostomy ? ?Patient in room, a bit confused. Pt stating he thinks he is going home tomorrow. RN reports plan is not for discharge at this time. Pt to go to SNF if able. ?Pt sitting in bed, eating 100% of his grapes from his lunch meal. He has not eaten lunch. Reports no breakfast this morning. States he drank a Glucerna shake this morning (most of it consumed).  ? ?PO intakes 3/25: 0-25% ?PO intakes: 3/26: 0% dinner documented. ? ?Pt reports feeling like his appetite is a bit better today. Plans to eat more of his lunch. ? ?Per nursing documentation, pt with mild BLE edema.  ? ?Per weight records, no weight loss noted. ? ?Medications: Vitamin D, Ferrous sulfate, Imodium, Remeron, Fibercon, KLOR-CON, Lactated ringers ? ?Labs reviewed: ?CBGs: 133-166 ? ?Diet Order:   ?Diet Order   ? ?       ?  Diet Heart Room service appropriate? Yes; Fluid consistency: Thin  Diet effective now       ?  ?  Diet - low sodium heart healthy       ?  ? ?  ?  ? ?   ? ? ?EDUCATION NEEDS:  ? ?Not appropriate for education at this time ? ?Skin:  Skin Assessment: Reviewed RN Assessment ? ?Last BM:  3/27- ileostomy ? ?Height:  ? ?Ht Readings from Last 1 Encounters:  ?05/27/21 6' (1.829 m)  ? ? ?Weight:  ? ?Wt Readings from Last 1 Encounters:  ?06/01/21 109.1 kg  ? ? ?BMI:  Body mass index is 32.62 kg/m?. ? ?Estimated Nutritional Needs:  ? ?Kcal:  2200-2400 ? ?Protein:  120-130g ? ?Fluid:  2.2L/day ? ?Clayton Bibles, MS, RD, LDN ?Inpatient Clinical Dietitian ?Contact information available via Amion ? ?

## 2021-06-01 NOTE — Progress Notes (Signed)
Occupational Therapy Treatment ?Patient Details ?Name: William Good ?MRN: [id number] ?DOB: Dec 26, 1955 ?Today's Date: 06/01/2021 ? ? ?History of present illness Pt s/p low anterior rectosigmoid resection, coloanal anastomisis, and diverting loop ileostomy 2* rectal CA.  Pt with CKD, DM, back surgery, R TKR and CKD ?  ?OT comments ? Patients session was limited with onset of dizziness with sitting EOB and standing attempts. Patient was able to stand with RW for about 15 seconds prior to increased dizziness and returning to bed.nurse made aware. Patient would continue to benefit from skilled OT services at this time while admitted and after d/c to address noted deficits in order to improve overall safety and independence in ADLs.  ? ?supine 115/68 mmhg (83) 82 bpm ?sitting EOB 93/31mg (83) 85 bpm  ?sitting after standing attempt 98/68 mmhg   ? ?Recommendations for follow up therapy are one component of a multi-disciplinary discharge planning process, led by the attending physician.  Recommendations may be updated based on patient status, additional functional criteria and insurance authorization. ?   ?Follow Up Recommendations ? Skilled nursing-short term rehab (<3 hours/day)  ?  ?Assistance Recommended at Discharge Frequent or constant Supervision/Assistance  ?Patient can return home with the following ? A little help with walking and/or transfers;A lot of help with bathing/dressing/bathroom;Assistance with cooking/housework;Assist for transportation;Help with stairs or ramp for entrance ?  ?Equipment Recommendations ? None recommended by OT  ?  ?Recommendations for Other Services   ? ?  ?Precautions / Restrictions Precautions ?Precautions: Fall ?Precaution Comments: new ileostomy, JP drain on R, orthostatic ?Restrictions ?Weight Bearing Restrictions: No  ? ? ?  ? ?Mobility Bed Mobility ?Overal bed mobility: Needs Assistance ?  ?  ?Sidelying to sit: Mod assist, HOB elevated ?  ?  ?Sit to sidelying: Mod  assist ?General bed mobility comments: cues for log roll technique multimodal cues. ?  ? ?Transfers ?  ?  ?  ?  ?  ?  ?  ?  ?  ?  ?  ?  ?Balance   ?  ?  ?  ?  ?  ?  ?  ?  ?  ?  ?  ?  ?  ?  ?  ?  ?  ?  ?   ? ?ADL either performed or assessed with clinical judgement  ? ?ADL Overall ADL's : Needs assistance/impaired ?  ?  ?  ?  ?  ?  ?  ?  ?  ?  ?  ?  ?  ?Toilet Transfer Details (indicate cue type and reason): attempted transfer with patient having increased dizziness with sitting on edge of bed and attempts at standing. session was stopped and patient was returned to supine. ?  ?  ?  ?  ?  ?  ?  ? ?Extremity/Trunk Assessment   ?  ?  ?  ?  ?  ? ?Vision   ?  ?  ?Perception   ?  ?Praxis   ?  ? ?Cognition Arousal/Alertness: Awake/alert ?Behavior During Therapy: Flat affect ?Overall Cognitive Status: No family/caregiver present to determine baseline cognitive functioning ?  ?  ?  ?  ?  ?  ?  ?  ?  ?  ?  ?  ?  ?  ?  ?  ?General Comments: patient reported being in hospital, "march 2004" was cooperative with cues but limited vocalizations ?  ?  ?   ?Exercises   ? ?  ?Shoulder Instructions   ? ? ?  ?  General Comments    ? ? ?Pertinent Vitals/ Pain       Pain Assessment ?Pain Assessment: Faces ?Faces Pain Scale: Hurts little more ?Pain Descriptors / Indicators: Guarding, Sore, Grimacing ?Pain Intervention(s): Limited activity within patient's tolerance, Monitored during session, Repositioned ? ?Home Living   ?  ?  ?  ?  ?  ?  ?  ?  ?  ?  ?  ?  ?  ?  ?  ?  ?  ?  ? ?  ?Prior Functioning/Environment    ?  ?  ?  ?   ? ?Frequency ? Min 2X/week  ? ? ? ? ?  ?Progress Toward Goals ? ?OT Goals(current goals can now be found in the care plan section) ? Progress towards OT goals: Progressing toward goals ? ?   ?Plan     ? ?Co-evaluation ? ? ?   ?  ?  ?  ?  ? ?  ?AM-PAC OT "6 Clicks" Daily Activity     ?Outcome Measure ? ? Help from another person eating meals?: A Little ?Help from another person taking care of personal grooming?: A  Little ?Help from another person toileting, which includes using toliet, bedpan, or urinal?: A Lot ?Help from another person bathing (including washing, rinsing, drying)?: A Lot ?Help from another person to put on and taking off regular upper body clothing?: A Lot ?Help from another person to put on and taking off regular lower body clothing?: A Lot ?6 Click Score: 14 ? ?  ?End of Session Equipment Utilized During Treatment: Rolling walker (2 wheels) ? ?OT Visit Diagnosis: Muscle weakness (generalized) (M62.81) ?  ?Activity Tolerance Patient limited by fatigue ?  ?Patient Left with call bell/phone within reach;in bed;with bed alarm set ?  ?Nurse Communication Mobility status ?  ? ?   ? ?Time: 9357-0177 ?OT Time Calculation (min): 16 min ? ?Charges: OT General Charges ?$OT Visit: 1 Visit ?OT Treatments ?$Therapeutic Activity: 8-22 mins ? ?Skii Cleland OTR/L, MS ?Acute Rehabilitation Department ?Office# [phone] ?Pager# [phone] ? ? ?Feliz Beam Laquisha Northcraft ?06/01/2021, 4:06 PM ?

## 2021-06-01 NOTE — Progress Notes (Signed)
GI Tumor Board patient referral: ? ? ?William Good  ?06-22-1955 ?[id number] ? ?CARE TEAM: Patient Care Team: ?Aura Dials, MD as PCP - General (Family Medicine) ?Michael Boston, MD as Consulting Physician (General Surgery) ?Otis Brace, MD as Consulting Physician (Gastroenterology) ?Ladell Pier, MD as Consulting Physician (Oncology) ?Kyung Rudd, MD as Consulting Physician (Radiation Oncology) ?Earl Gala, Deliah Goody, RN as Equities trader (Oncology) ?Larey Dresser, MD as Consulting Physician (Cardiology) ?Lucas Mallow, MD as Consulting Physician (Urology) ? ?Diagnosis: Rectal Cancer - s/p total chemoXRT & LAR ? ?MD Care Team:  Michael Boston, MD - surgery, Arturo Morton, MD - medical oncology, Kyung Rudd, MD - radiation oncology, and Gastroenterology:  Dr Otis Brace ? ?Focus of discussion: Radiology & Pathology reviews - comprehensive ? ? ?Please send to GI Tumor Coordinator Santiago Glad [place])  ?in Epic message and attach the medical record to it ?

## 2021-06-02 LAB — GLUCOSE, CAPILLARY
Glucose-Capillary: 141 mg/dL — ABNORMAL HIGH (ref 70–99)
Glucose-Capillary: 151 mg/dL — ABNORMAL HIGH (ref 70–99)
Glucose-Capillary: 118 mg/dL — ABNORMAL HIGH (ref 70–99)
Glucose-Capillary: 183 mg/dL — ABNORMAL HIGH (ref 70–99)

## 2021-06-02 LAB — MAGNESIUM: Magnesium: 1.9 mg/dL (ref 1.7–2.4)

## 2021-06-02 LAB — CREATININE, SERUM
Creatinine, Ser: 1.39 mg/dL — ABNORMAL HIGH (ref 0.61–1.24)
GFR, Estimated: 56 mL/min — ABNORMAL LOW (ref >60)

## 2021-06-02 LAB — POTASSIUM: Potassium: 4.1 mmol/L (ref 3.5–5.1)

## 2021-06-02 MED ORDER — HEPARIN SOD (PORK) LOCK FLUSH 100 UNIT/ML IV SOLN: 500.0000 [IU] | INTRAVENOUS | Status: DC

## 2021-06-02 MED ORDER — HEPARIN SOD (PORK) LOCK FLUSH 100 UNIT/ML IV SOLN: 500.0000 [IU] | INTRAVENOUS | Status: DC | PRN

## 2021-06-02 MED ORDER — LOPERAMIDE HCL 2 MG PO CAPS
2.0000 mg | ORAL_CAPSULE | Freq: Two times a day (BID) | ORAL | Status: DC
  Administered 2021-06-02 – 2021-06-05 (×7): 2 mg via ORAL
  Filled 2021-06-02 (×7): qty 1

## 2021-06-02 MED ORDER — ALPRAZOLAM 0.5 MG PO TABS
0.5000 mg | ORAL_TABLET | Freq: Two times a day (BID) | ORAL | Status: DC
Start: 2021-06-02 — End: 2021-06-05
  Administered 2021-06-02 – 2021-06-05 (×7): 0.5 mg via ORAL
  Filled 2021-06-02 (×7): qty 1

## 2021-06-02 MED ORDER — ENSURE MAX PROTEIN PO LIQD
11.0000 [oz_av] | Freq: Every day | ORAL | Status: DC
  Administered 2021-06-02 – 2021-06-03 (×2): 11 [oz_av] via ORAL

## 2021-06-02 MED ORDER — METFORMIN HCL 500 MG PO TABS
500.0000 mg | ORAL_TABLET | Freq: Two times a day (BID) | ORAL | Status: DC
  Administered 2021-06-02 – 2021-06-05 (×7): 500 mg via ORAL
  Filled 2021-06-02 (×7): qty 1

## 2021-06-02 NOTE — Progress Notes (Signed)
Calorie Count Note ? ?48 hour calorie count ordered. ? ?Diet: Heart Healthy ?Supplements:  ?-Glucerna Shake po BID, each supplement provides 220 kcal and 10 grams of protein  ? ?3/27: ?Breakfast: 0% ?Lunch: 240 kcals, 11g protein ?Dinner: 0% ?Supplements: 2 Glucernas -440 kcals, 20g protein ? ?Total intake: ?680 kcal (30% of minimum estimated needs)  ?31g protein (25% of minimum estimated needs) ? ?Nutrition Dx: Increased nutrient needs related to post-op healing, cancer and cancer related treatments as evidenced by estimated needs. ? ?Goal: Pt to meet >/= 90% of their estimated nutrition needs  ? ?Intervention:  ?-Glucerna Shake po BID, each supplement provides 220 kcal and 10 grams of protein  ?-Add Ensure Max daily, 150 kcals, 30g protein ?-Went ahead and ordered lunch and dinner for patient. Pt not ordering meals consistently. ?-Continue Calorie Count ? ?Clayton Bibles, MS, RD, LDN ?Inpatient Clinical Dietitian ?Contact information available via Amion ?  ?

## 2021-06-02 NOTE — Progress Notes (Signed)
Pt bladder scan shows 796 ml and pt voided 100 per urinal.  ?Post void Residual shows 569, Placed catheter to leg bag per order. ? ?

## 2021-06-02 NOTE — Progress Notes (Addendum)
? [name] Good ?[id number] ?04/23/1955 ? ?CARE TEAM: ? ?PCP: William Dials, MD ? ?Outpatient Care Team: Patient Care Team: ?William Dials, MD as PCP - General (Family Medicine) ?William Boston, MD as Consulting Physician (General Surgery) ?William Brace, MD as Consulting Physician (Gastroenterology) ?William Pier, MD as Consulting Physician (Oncology) ?William Rudd, MD as Consulting Physician (Radiation Oncology) ?William Good, William Goody, RN as Equities trader (Oncology) ?William Dresser, MD as Consulting Physician (Cardiology) ?William Mallow, MD as Consulting Physician (Urology) ? ?Inpatient Treatment Team: Treatment Team: Attending Provider: Michael Boston, MD; [place] Nurse: McNichol, Rudi Heap, RN; Consulting Physician: William Pier, MD; Rounding Team: William Gavel, MD; Consulting Physician: William Cousins, MD; Technician: William Good, NT; Charge Nurse: William Resides, RN; Registered Nurse: William Bitter, RN ? ? ?Problem List:  ? ?Principal Problem: ?  Rectal cancer ([place]) ?Active Problems: ?  Chronic kidney disease, stage 3 unspecified ([place]) ?  AKI (acute kidney injury) ([place]) ?  Essential hypertension ?  Migraine ?  Type 2 diabetes mellitus with complication, without long-term current use of insulin ([place]) ?  Bipolar disorder ([place]) ?  Polycystic kidney ?  Coronary artery calcification ?  High output ileostomy ([place]) ? ? ?6 Days Post-Op  05/27/2021 ? ?POST-OPERATIVE DIAGNOSIS:  DISTAL RECTAL CANCER ?  ?PROCEDURE:   ?-ROBOTIC LOW ANTERIOR RECTOSIGMOID RESECTION ?-VERY LOW COLOANAL ANASTOMOSIS ?-DIVERTING LOOP ILEOSTOMY ?-INTRAOPERATIVE ASSESSMENT OF TISSUE VASCULAR PERFUSION USING ICG (indocyanine green) IMMUNOFLUORESCENCE ?-TRANSVERSUS ABDOMINIS PLANE (TAP) BLOCK - BILATERAL ?-RIGID PROCTOSCOPY ?  ?SURGEON:  William Hector, MD ? ?OR FINDINGS:  ?  ?Patient had fibrous scarring in the distal rectum 4-7 cm from the anal verge.  Some thin stricturing.  Consistent with pretty good  pathological response compared to the very bulky tumor at first.  Very fibrotic and narrow pelvis with bulky mesorectum. ?  ?No obvious metastatic disease on visceral parietal peritoneum or liver. ?  ?The anastomosis rests 2 cm from the anal verge by rigid proctoscopy.  There is a 31 EEA mid descending colon to anal rectal margin stump ? ?###################################### ? ?Preoperative diagnosis:  ?1. Ureteral identification for pelvic surgery ?  ?Postoperative diagnosis: ?1. same ?  ?Procedure(s): ?1. Cystoscopy with bilateral ureteral catheterization for firefly injection ?  ?Surgeon: Dr. Donald Good ?  ? ?################################################ ? ?SURGICAL PATHOLOGY  ? ?FINAL MICROSCOPIC DIAGNOSIS:  ? ?A. COLON, RECTOSIGMOID, RESECTION:  ?- Fibrosis and slight inflammation consistent with treatment effect.  ?- No residual carcinoma identified.  ?- Margins negative for carcinoma.  ?- Five lymph nodes negative for metastatic carcinoma (0/5).  ?- See oncology table.  ? ?B. ANASTOMOSIS RING, DISTAL, EXCISION:  ?- Benign colon.  ?- No evidence of malignancy.  ? ?ONCOLOGY TABLE:  ?COLON AND RECTUM, CARCINOMA:  Resection  ?Regional Lymph Nodes:  ?     Number of Lymph Nodes with Tumor: 0  ?     Number of Lymph Nodes Examined: 5  ?Tumor Deposits: Not identified.  ?Pathologic Stage Classification (pTNM, AJCC 8th Edition): ypT0, ypN0  ? ?Assessment ? ?Recovering ? ?(Hospital Stay = 6 days) ? ?Plan: ? ?ERAS enhance recovery pathway ? ?Working to [employer] antidiarrhea Gerkin to control high output ileostomy.  Fiber twice daily.  Iron twice daily.  Increase Imodium to twice daily with as needed breakthrough  ? ?Solid diet.  Calorie counts.  I worry his p.o. intake will be fair and he is at risk for getting dehydrated again.  We will see. ? ?AKI in  the setting of CKD3 ?-Most likely combination of dehydration from ileostomy and urinary retention ?-Remove Foley catheter removed and patient voiding.   ?Check bladder  postvoid residuals to make sure he is not retaining like he has before.   ?If urinating fine then see if we can avoid chronic Foley catheter  ?If cannot void then replace Foley and have urology follow-up for urinary studies in a few weeks. ?-Avoiding nephrotoxic agents ?-Nephrology saw over the weekend and is already signed off yesterday.  Reconsult if not better.  Hopefully less likely ? ?Follow off standing IV fluids and transition to 6 weeks of intermittent IV fluid boluses q. Monday Wednesday Friday through the Port-A-Cath to avoid dehydration at discharge. ? ?Diabetic control.  Sliding scale insulin started .  We will ask medicine to have input to help start regimen since he is not on anything.  Called [facility] - Dr William Good recommends trial of metformin 500 twice daily.  We will start and monitor ? ?Bipolar disorder.-No mental status changes.  Continue baseline medication. ? ?Xanax for anxiety.  No breakthrough at this time.  Seems little sedated.  Cut back for now ? ?Follow-up on pathology - ypT1ypN0 -good pathological response.  Added patient to tumor board to discuss this week ? ?Patient had small EEA anastomotic leak controlled with drain and planned loop ileostomy diversion.  Drain looks serosanguineous today compared to slightly thinly feculent yesterday = probably already sealed up.   Hopeful sign.  Follow.  Keep drain for several weeks. ? ?Ileostomy care and training. ? ?Have involvement with physical, occupational therapies.  Also case management since patient apparently lives at home but is borderline functioning/independent and brother most likely does not feel comfortable managing patient by himself.  Seems to be rather deconditioned.  I believe he will require a SNF.  Impression his family is rather overwhelmed on trying to help take care of him.  Current therapies agree with that.  They will help follow. ? ?-VTE prophylaxis- SCDs, etc ? ?-mobilize as tolerated to help recovery.  Patient feeling a little  wiped out.  Keep trying to mobilize as tolerated. ? ?Disposition:  ?Disposition:  ?The patient is from: Home ? ?Anticipate discharge to:  [facility] (SNF) ? ?Anticipated Date of Discharge is:  March 29,2023 ?  ? ?Barriers to discharge:  Pending Clinical improvement (more likely than not) ? ?Patient currently is NOT MEDICALLY STABLE for discharge from the hospital from a surgery standpoint. ? ? ? ? ? ?I reviewed nursing notes, last 24 h vitals and pain scores, last 48 h intake and output, last 24 h labs and trends, and last 24 h imaging results. I have reviewed this patient's available data, including medical history, events of note, test results, etc as part of my evaluation.  A significant portion of that time was spent in counseling.  Care during the described time interval was provided by me. ? ?This care required high  level of medical decision making.  06/02/2021 ? ? ? ?Subjective: ?(Chief complaint) ? ?Patient trying to take p.o. ? ?Foley catheter removed.  Nursing notes he is voiding on his own.  Patient confirms that. ? ?No nausea or vomiting. ? ?Staying in bed mostly but starting to mobilize with therapies ? ?Objective: ? ?Vital signs: ? ?Vitals:  ? 06/01/21 0507 06/01/21 1546 06/01/21 2051 06/02/21 0447  ?BP: (!) 142/84 93/66 131/87 134/81  ?Pulse: 88 85 89 82  ?Resp: '17 18 18 16  '$ ?Temp: 98.3 ?F (36.8 ?C) 98.6 ?  F (37 ?C) 98.6 ?F (37 ?C) 98.8 ?F (37.1 ?C)  ?TempSrc: Oral Oral Oral Oral  ?SpO2: 95%  97% 95%  ?Weight: 109.1 kg   109.6 kg  ?Height:      ? ? ?Last BM Date : 06/01/21 ? ?Intake/Output  ? ?Yesterday: ? 03/27 0701 - 03/28 0700 ?In: 2760 [P.O.:1680; IV Piggyback:1080] ?Out: 8527 [Urine:2650; Drains:490; Stool:575] ?This shift: ? Total I/O ?In: 360 [P.O.:360] ?Out: 715 [Urine:450; Drains:65; Stool:200] ? ?Bowel function: ? Flatus: YES ? BM:  YES -mildlt thickened dark brown succus effluent ? Drain: Thinly serosanguineous.   ? ?Physical Exam: ? ?General: Pt alert in no acute distress.   Looks a little tired and spacey which is his baseline. ?Eyes: PERRL, normal EOM.  Sclera clear.  No icterus ?Neuro: CN II-XII intact w/o focal sensory/motor deficits. ?Lymph: No head/neck/groin lymphadenopath

## 2021-06-02 NOTE — Progress Notes (Signed)
Physical Therapy Treatment ?Patient Details ?Name: William Good ?MRN: [id number] ?DOB: 1955/10/25 ?Today's Date: 06/02/2021 ? ? ?History of Present Illness Pt s/p low anterior rectosigmoid resection, coloanal anastomisis, and diverting loop ileostomy 2* rectal CA.  Pt with CKD, DM, back surgery, R TKR and CKD ? ?  ?PT Comments  ? ? POD # 6 ROBOTIC LOW ANTERIOR COLON RESECTION ?Pt in bed with Brother Tim at bedside.  General Comments: AxO x 3 a little flat but following all directions and converasive.  Retired Astronomer.  Feeling "better" but still "weak". ?Assisted OOB to amb required + 2 assist.  General bed mobility comments: Max assist for upper body supine to sit due to recent ABD surgery.  Max Assist for lower body sit to supine back onto bed. General transfer comment: 50% VC's on proper hand placement to avoid pulling up on walker.  25% VC's on safety with turns using walker. General Gait Details: VERY unsteady gait tolerated amb 22 feet then required a seated rest break.  Poor forward flex posture ans shuffled, short steps.  Feels "waek" Poor balance with delayed correction response.  .X 1 lateral LOB in hallway/Therapist corrected.  Recliner following behind for safety as pt had an episode of orthostatic BP's prior.  Assisted back to bed per pt request. ?Prior pt lived home alone.  He will need [facility] Re[facility]. ?  ?Recommendations for follow up therapy are one component of a multi-disciplinary discharge planning process, led by the attending physician.  Recommendations may be updated based on patient status, additional functional criteria and insurance authorization. ? ?Follow Up Recommendations ? Skilled nursing-short term rehab (<3 hours/day) ?  ?  ?Assistance Recommended at Discharge Frequent or constant Supervision/Assistance  ?Patient can return home with the following A lot of help with walking and/or transfers;A lot of help with bathing/dressing/bathroom;Assistance with cooking/housework;Assist  for transportation;Help with stairs or ramp for entrance ?  ?Equipment Recommendations ? None recommended by PT  ?  ?Recommendations for Other Services   ? ? ?  ?Precautions / Restrictions Precautions ?Precautions: Fall ?Precaution Comments: new ileostomy, JP drain on R, ?Restrictions ?Weight Bearing Restrictions: No  ?  ? ?Mobility ? Bed Mobility ?Overal bed mobility: Needs Assistance ?Bed Mobility: Supine to Sit ?  ?  ?Supine to sit: Max assist ?  ?  ?General bed mobility comments: Max assist for upper body supine to sit due to recent ABD surgery.  Max Assist for lower body sit to supine back onto bed. ?  ? ?Transfers ?Overall transfer level: Needs assistance ?Equipment used: Rolling walker (2 wheels) ?Transfers: Sit to/from Stand ?Sit to Stand: Min assist, +2 physical assistance, +2 safety/equipment, From elevated surface ?  ?  ?  ?  ?  ?General transfer comment: 50% VC's on proper hand placement to avoid pulling up on walker.  25% VC's on safety with turns using walker. ?  ? ?Ambulation/Gait ?Ambulation/Gait assistance: Min assist, +2 physical assistance, +2 safety/equipment ?Gait Distance (Feet): 44 Feet (22 feet x 2 sitting rest break) ?Assistive device: Rolling walker (2 wheels) ?Gait Pattern/deviations: Step-to pattern, Decreased step length - right, Decreased step length - left, Shuffle ?Gait velocity: decreased ?  ?  ?General Gait Details: VERY unsteady gait tolerated amb 22 feet then required a seated rest break.  Poor forward flex posture ans shuffled, short steps.  Feels "waek" Poor balance with delayed correction response.  .X 1 lateral LOB in hallway/Therapist corrected.  Recliner following behind for safety as pt had an episode  of orthostatic BP's prior. ? ? ?Stairs ?  ?  ?  ?  ?  ? ? ?Wheelchair Mobility ?  ? ?Modified Rankin (Stroke Patients Only) ?  ? ? ?  ?Balance   ?  ?  ?  ?  ?  ?  ?  ?  ?  ?  ?  ?  ?  ?  ?  ?  ?  ?  ?  ? ?  ?Cognition Arousal/Alertness: Awake/alert ?Behavior During Therapy:  [facility] for tasks assessed/performed ?Overall Cognitive Status: Within Functional Limits for tasks assessed ?  ?  ?  ?  ?  ?  ?  ?  ?  ?  ?  ?  ?  ?  ?  ?  ?General Comments: AxO x 3 a little flat but following all directions and converasive.  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 ?  ?  ? ?  ?Exercises   ? ?  ?General Comments   ?  ?  ? ?Pertinent Vitals/Pain Pain Assessment ?Pain Assessment: Faces ?Faces Pain Scale: Hurts little more ?Pain Location: abdomen with movement ?Pain Descriptors / Indicators: Guarding, Sore, Grimacing, Operative site guarding ?Pain Intervention(s): Monitored during session, Repositioned  ? ? ?Home Living   ?  ?  ?  ?  ?  ?  ?  ?  ?  ?   ?  ?Prior Function    ?  ?  ?   ? ?PT Goals (current goals can now be found in the care plan section) Progress towards PT goals: Progressing toward goals ? ?  ?Frequency ? ? ? Min 3X/week ? ? ? ?  ?PT Plan Current plan remains appropriate  ? ? ?Co-evaluation   ?  ?  ?  ?  ? ?  ?AM-PAC PT "6 Clicks" Mobility   ?Outcome Measure ? Help needed turning from your back to your side while in a flat bed without using bedrails?: A Little ?Help needed moving from lying on your back to sitting on the side of a flat bed without using bedrails?: A Little ?Help needed moving to and from a bed to a chair (including a wheelchair)?: A Little ?Help needed standing up from a chair using your arms (e.g., wheelchair or bedside chair)?: A Little ?Help needed to walk in hospital room?: A Little ?Help needed climbing 3-5 steps with a railing? : A Little ?6 Click Score: 18 ? ?  ?End of Session Equipment Utilized During Treatment: Gait belt ?Activity Tolerance: Patient limited by fatigue ?Patient left: in bed;with call bell/phone within reach;with bed alarm set;with family/visitor present ?Nurse Communication: Mobility status ?PT Visit Diagnosis: Unsteadiness on feet (R26.81);History of falling (Z91.81);Difficulty in walking, not elsewhere classified (R26.2);Pain ?   ? ? ?Time: 1542-1600 ?PT Time Calculation (min) (ACUTE ONLY): 18 min ? ?Charges:  $Gait Training: 8-22 mins          ?          ? ?Rica Koyanagi  PTA ?Acute  Rehabilitation Services ?Pager      [phone] ?Office      [phone] ? ?

## 2021-06-02 NOTE — Care Management Important Message (Signed)
Important Message ? ?Patient Details IM Letter given to the Patient. ?Name: William Good ?MRN: [id number] ?Date of Birth: 07-04-1955 ? ? ?Medicare Important Message Given:  Yes ? ? ? ? ?Kerin Salen ?06/02/2021, 11:31 AM ?

## 2021-06-03 ENCOUNTER — Other Ambulatory Visit: Payer: Self-pay

## 2021-06-03 LAB — GLUCOSE, CAPILLARY
Glucose-Capillary: 156 mg/dL — ABNORMAL HIGH (ref 70–99)
Glucose-Capillary: 179 mg/dL — ABNORMAL HIGH (ref 70–99)
Glucose-Capillary: 171 mg/dL — ABNORMAL HIGH (ref 70–99)
Glucose-Capillary: 160 mg/dL — ABNORMAL HIGH (ref 70–99)

## 2021-06-03 LAB — CBC
HCT: 24.4 % — ABNORMAL LOW (ref 39.0–52.0)
Hemoglobin: 8.1 g/dL — ABNORMAL LOW (ref 13.0–17.0)
MCH: 33.1 pg (ref 26.0–34.0)
MCHC: 33.2 g/dL (ref 30.0–36.0)
MCV: 99.6 fL (ref 80.0–100.0)
Platelets: 206 10*3/uL (ref 150–400)
RBC: 2.45 MIL/uL — ABNORMAL LOW (ref 4.22–5.81)
RDW: 13.8 % (ref 11.5–15.5)
WBC: 9.5 10*3/uL (ref 4.0–10.5)
nRBC: 0.7 % — ABNORMAL HIGH (ref 0.0–0.2)

## 2021-06-03 LAB — CREATININE, SERUM
Creatinine, Ser: 1.5 mg/dL — ABNORMAL HIGH (ref 0.61–1.24)
GFR, Estimated: 51 mL/min — ABNORMAL LOW (ref >60)

## 2021-06-03 LAB — SURGICAL PATHOLOGY

## 2021-06-03 LAB — POTASSIUM: Potassium: 3.6 mmol/L (ref 3.5–5.1)

## 2021-06-03 MED ORDER — LOPERAMIDE HCL 2 MG PO CAPS: 2.0000 mg | ORAL_CAPSULE | Freq: Two times a day (BID) | ORAL | 0 refills | Status: DC

## 2021-06-03 MED ORDER — TAMSULOSIN HCL 0.4 MG PO CAPS
0.4000 mg | ORAL_CAPSULE | Freq: Every day | ORAL | Status: DC
  Administered 2021-06-03 – 2021-06-05 (×3): 0.4 mg via ORAL
  Filled 2021-06-03 (×3): qty 1

## 2021-06-03 MED ORDER — PROPRANOLOL HCL ER 80 MG PO CP24
80.0000 mg | ORAL_CAPSULE | Freq: Every day | ORAL | Status: DC
  Administered 2021-06-03 – 2021-06-04 (×2): 80 mg via ORAL
  Filled 2021-06-03 (×2): qty 1

## 2021-06-03 MED ORDER — TAMSULOSIN HCL 0.4 MG PO CAPS: 0.4000 mg | ORAL_CAPSULE | Freq: Every day | ORAL | 1 refills | Status: DC

## 2021-06-03 MED ORDER — CALCIUM POLYCARBOPHIL 625 MG PO TABS: 625.0000 mg | ORAL_TABLET | Freq: Two times a day (BID) | ORAL | 5 refills | Status: DC

## 2021-06-03 MED ORDER — METFORMIN HCL 500 MG PO TABS: 500.0000 mg | ORAL_TABLET | Freq: Two times a day (BID) | ORAL | 2 refills | Status: DC

## 2021-06-03 MED ORDER — TRAMADOL HCL 50 MG PO TABS: 50.0000 mg | ORAL_TABLET | Freq: Four times a day (QID) | ORAL | 0 refills | Status: DC | PRN

## 2021-06-03 MED ORDER — PROPRANOLOL HCL ER 80 MG PO CP24: 160.0000 mg | ORAL_CAPSULE | Freq: Every day | ORAL | 2 refills | Status: DC

## 2021-06-03 NOTE — Discharge Summary (Addendum)
Physician Discharge Summary  ? ? ?Patient ID: ?William Good ?MRN: [id number] ?DOB/AGE: 1955-05-02  ?66 y.o. ? ?Patient Care Team: ?Aura Dials, MD as PCP - General (Family Medicine) ?Michael Boston, MD as Consulting Physician (General Surgery) ?Otis Brace, MD as Consulting Physician (Gastroenterology) ?Ladell Pier, MD as Consulting Physician (Oncology) ?Kyung Rudd, MD as Consulting Physician (Radiation Oncology) ?Earl Gala, Deliah Goody, RN as Equities trader (Oncology) ?Larey Dresser, MD as Consulting Physician (Cardiology) ?Lucas Mallow, MD as Consulting Physician (Urology) ? ?Admit date: 05/27/2021 ? ?Discharge date: 06/05/2021 ? ?Hospital Stay = 8 days ? ? ? ?Discharge Diagnoses:  ?Principal Problem: ?  Rectal cancer ([place]) ?Active Problems: ?  Chronic kidney disease, stage 3 unspecified ([place]) ?  AKI (acute kidney injury) ([place]) ?  Essential hypertension ?  Migraine ?  Type 2 diabetes mellitus with complication, without long-term current use of insulin ([place]) ?  Bipolar disorder ([place]) ?  Polycystic kidney ?  Coronary artery calcification ?  High output ileostomy ([place]) ? ? ?8 Days Post-Op  05/27/2021 ? ?POST-OPERATIVE DIAGNOSIS:  DISTAL RECTAL CANCER ?  ?PROCEDURE:   ?-ROBOTIC LOW ANTERIOR RECTOSIGMOID RESECTION ?-VERY LOW COLOANAL ANASTOMOSIS ?-DIVERTING LOOP ILEOSTOMY ?-INTRAOPERATIVE ASSESSMENT OF TISSUE VASCULAR PERFUSION USING ICG (indocyanine green) IMMUNOFLUORESCENCE ?-TRANSVERSUS ABDOMINIS PLANE (TAP) BLOCK - BILATERAL ?-RIGID PROCTOSCOPY ?  ?SURGEON:  Adin Hector, MD ?  ?OR FINDINGS:  ?  ?Patient had fibrous scarring in the distal rectum 4-7 cm from the anal verge.  Some thin stricturing.  Consistent with pretty good pathological response compared to the very bulky tumor at first.  Very fibrotic and narrow pelvis with bulky mesorectum. ?  ?No obvious metastatic disease on visceral parietal peritoneum or liver. ?  ?The anastomosis rests 2 cm from the anal verge by rigid proctoscopy.   There is a 31 EEA mid descending colon to anal rectal margin stump ?  ?###################################### ?  ?Preoperative diagnosis:  ?1. Ureteral identification for pelvic surgery ?  ?Postoperative diagnosis: ?1. same ?  ?Procedure(s): ?1. Cystoscopy with bilateral ureteral catheterization for firefly injection ?  ?Surgeon: Dr. Donald Pore ?  ?  ?################################################ ?  ?SURGICAL PATHOLOGY  ?  ?FINAL MICROSCOPIC DIAGNOSIS:  ? ?A. COLON, RECTOSIGMOID, RESECTION:  ?- Fibrosis and slight inflammation consistent with treatment effect.  ?- No residual carcinoma identified.  ?- Margins negative for carcinoma.  ?- Five lymph nodes negative for metastatic carcinoma (0/5).  ?- See oncology table.  ? ?B. ANASTOMOSIS RING, DISTAL, EXCISION:  ?- Benign colon.  ?- No evidence of malignancy.  ? ?ONCOLOGY TABLE:  ?COLON AND RECTUM, CARCINOMA:  Resection  ?Regional Lymph Nodes:  ?     Number of Lymph Nodes with Tumor: 0  ?     Number of Lymph Nodes Examined: 5  ?Tumor Deposits: Not identified.  ?Pathologic Stage Classification (pTNM, AJCC 8th Edition): ypT0, ypN0  ? ?Consults: Physical Therapy, Occupational Therapy, Wound ostomy consult nurse (WOCN), Pharmacy, Nutrition, Case Management / Social Work, Medical Oncology, and Urology ? ?Hospital Course:  ? ?Patient found to have rectal cancer.  Underwent total neoadjuvant chemoradiation therapy.  10 weeks after recover was admitted and underwent robotic low anterior rectosigmoid resection with diverting loop ileostomy on the day of admission.  Postoperatively, the patient gradually mobilized and advanced to a solid diet.  Pain and other symptoms were treated aggressively.   ? ?Patient had hyperglycemia with poorly controlled diabetes.  Medicine consultation made.  Placed on sliding scale insulin.  Recommendation made to switch to  metformin 5 mg p.o. twice daily.  Glucose control improved. ? ?Patient had high output ileostomy.  Ileostomy diarrhea  controlled with fiber/iron/Imodium twice daily.  Also made recommendation of 1 L IV fluids through his Port-A-Cath daily x6 weeks to minimize dehydration and renal failure. ? ?Patient had chronic kidney disease.  Never was oliguric but did have a rising creatinine.  Nephrology was consulted.  Felt to be more obstructive.  Foley catheter placed with large return despite him voiding across this.  Creatinine normalized.  3 days later attempt at Foley removal but had difficulty urinating.  Foley replaced.  Discussed with Dr. Gloriann Loan, his urologist.  Recommendation starting tamsulosin/Flomax and follow-up in a couple weeks in their office to see if postvoid residual will succeed ? ?Pathology came back with a complete pathologic response.  Discussed at GI tumor board.  Felt no need for further post adjuvant chemotherapy but good idea to plug him back in to the cancer center for survival pathway ? ?By the time of discharge, the patient was walking well the hallways, eating food, having flatus.  Pain was well-controlled on an oral medications.  Based on meeting discharge criteria and continuing to recover, I felt it was safe for the patient to be discharged from the hospital to further recover with close followup.  It was felt by myself as well as the therapist and nursing that he would benefit from being in a skilled nursing facility.  Patient's brother rather involved and felt uncomfortable managing him by himself, especially in light of his bipolar disorder and other issues.  Postoperative recommendations were discussed in detail.  They are written as well. ? ?Discharged Condition: fair ? ?Discharge Exam: ?Blood pressure 111/71, pulse 82, temperature 98.2 ?F (36.8 ?C), temperature source Oral, resp. rate 16, height 6' (1.829 m), weight 109.6 kg, SpO2 95 %. ? ?General: Pt awake/alert/oriented x4 in No acute distress ?Eyes: PERRL, normal EOM.  Sclera clear.  No icterus ?Neuro: CN II-XII intact w/o focal sensory/motor  deficits. ?Lymph: No head/neck/groin lymphadenopathy ?Psych:  No delerium/psychosis/paranoia ?HENT: Normocephalic, Mucus membranes moist.  No thrush ?Neck: Supple, No tracheal deviation ?Chest:  No chest wall pain w good excursion ?CV:  Pulses intact.  Regular rhythm ?MS: Normal AROM mjr joints.  No obvious deformity ?Abdomen: Soft.  Nondistended.  Nontender.  No evidence of peritonitis.  Drain serosanguineous.  Mostly serous.  Ileostomy pink with oatmeal consistency effluent in bag.  Improved no incarcerated hernias. ?Ext:  SCDs BLE.  No mjr edema.  No cyanosis ?Skin: No petechiae / purpura ? ? ?Disposition:  ? ? Follow-up Information   ? ? Michael Boston, MD Follow up in 3 week(s).   ?Specialties: General Surgery, Colon and Rectal Surgery ?Why: To follow up after your operation, To follow up after your hospital stay ?Contact information: ?[address] ?[address] ?[place] [address] ?[phone] ? ? ?  ?  ? ? [facility]. Schedule an appointment as soon as possible for a visit in 1 week(s).   ?Specialty: General Surgery ?Why: TO GET OSTOMY CARE / TRAINING AT [facility] ?Contact information: ?[address] ?[id number] mc ?[place] ?[phone] ? ?  ?  ? ? Lucas Mallow, MD. Schedule an appointment as soon as possible for a visit in 2 week(s).   ?Specialty: Urology ?Why: To have your Foley bladder catheter removed & check urinary function ?Contact information: ?[address] ?[place] [address] ?[phone] ? ? ?  ?  ? ?  ?  ? ?  ? ? ?  Discharge disposition: 03-Skilled Nursing Facility ? ? ? ? ? ? ?Discharge Instructions   ? ? Call MD for:   Complete by: As directed ?  ? FEVER > 101.5 F  ?(temperatures < 101.5 F are not significant)  ? Call MD for:   Complete by: As directed ?  ? FEVER > 101.5 F  ?(temperatures < 101.5 F are not significant)  ? Call MD for:  extreme fatigue   Complete by: As directed ?  ? Call MD for:  extreme fatigue    Complete by: As directed ?  ? Call MD for:  persistant dizziness or light-headedness   Complete by: As directed ?  ? Call MD for:  persistant dizziness or light-headedness   Complete by: As directed ?  ? Call MD f

## 2021-06-03 NOTE — TOC Progression Note (Signed)
Transition of Care (TOC) - Progression Note  ? ? ?Patient Details  ?Name: William Good ?MRN: [id number] ?Date of Birth: 05/18/1955 ? ?Transition of Care (TOC) CM/SW Contact  ?Donterrius Santucci, LCSW ?Phone Number: ?06/03/2021, 2:33 PM ? ?Clinical Narrative:    ?Pt medically cleared for dc to SNF now and bed search is underway.  No offers at this time.  Will keep team posted. ? ? ?Expected Discharge Plan: [place] ?Barriers to Discharge: Continued Medical Work up, Ship broker, [place] (PASRR), SNF Pending bed offer ? ?Expected Discharge Plan and Services ?Expected Discharge Plan: [place] ?In-house Referral: Clinical Social Work ?  ?  ?Living arrangements for the past 2 months: [place] ?Expected Discharge Date: 06/03/21               ?DME Arranged: N/A ?DME Agency: NA ?  ?  ?  ?  ?  ?  ?  ?  ? ? ?Social Determinants of Health (SDOH) Interventions ?  ? ?Readmission Risk Interventions ? ?  05/29/2021  ?  2:21 PM  ?Readmission Risk Prevention Plan  ?Transportation Screening Complete  ?PCP or Specialist Appt within 3-5 Days Complete  ?[place] or Home Care Consult Complete  ?Social Work Consult for [place] Planning/Counseling Complete  ?Palliative Care Screening Not Applicable  ? ? ?

## 2021-06-03 NOTE — Progress Notes (Signed)
Pts breathing is labored. RR in low 30s. O2 sat on Ra was 89% placed on 2L. Gave abuterol inhaler. RN is contacting md. ?

## 2021-06-03 NOTE — NC FL2 (Signed)
?[place] MEDICAID FL2 LEVEL OF CARE SCREENING TOOL  ?  ? ?IDENTIFICATION  ?Patient Name: ?William Good Birthdate: [birth year] Sex: male Admission Date (Current Location): ?05/27/2021  ?[place] and [place] Number: ? [place] ?  Facility and Address:  ?[facility],  [place], [place] ?     Provider Number: ?[id number]  ?Attending Physician Name and Address:  ?Michael Boston, MD ? Relative Name and Phone Number:  ?brother, Mildred Bollard [phone] ?   ?Current Level of Care: ?Hospital Recommended Level of Care: ?[place] Prior Approval Number: ?  ? ?Date Approved/Denied: ?  PASRR Number: ?[id number] F ? ?Discharge Plan: ?SNF ?  ? ?Current Diagnoses: ?Patient Active Problem List  ? Diagnosis Date Noted  ? High output ileostomy ([place]) 05/29/2021  ? Coronary artery calcification 05/28/2021  ? AKI (acute kidney injury) ([place]) 05/28/2021  ? Bipolar disorder ([place]) 05/27/2021  ? Polycystic kidney 05/27/2021  ? Type 2 diabetes mellitus with other circulatory complications ([place]) 66/59/9357  ? Tobacco abuse 05/27/2021  ? Rectal adenocarcinoma ([place]) 05/27/2021  ? Preoperative clearance 03/19/2021  ? Type 2 diabetes mellitus with complication, without long-term current use of insulin ([place]) 03/19/2021  ? Gastroesophageal reflux disease 03/17/2021  ? Hyperlipidemia 03/17/2021  ? Benign prostatic hyperplasia with lower urinary tract symptoms 03/17/2021  ? Migraine without aura, not refractory 03/17/2021  ? Obesity 03/17/2021  ? Restless legs 03/17/2021  ? Severe recurrent major depression with psychotic features ([place]) 03/17/2021  ? Vitamin D deficiency 03/17/2021  ? Chronic kidney disease, stage 3 unspecified ([place]) 09/24/2020  ? Obstruction of left ureteropelvic junction (UPJ) 09/24/2020  ? Rectal cancer ([place]) 09/16/2020  ? Prolapsed internal hemorrhoids, grade 2 09/16/2020  ? Migraine 09/16/2020  ? Essential hypertension 01/16/2010  ? SHORTNESS OF BREATH 01/16/2010  ? CHEST PAIN  01/16/2010  ? ELECTROCARDIOGRAM, ABNORMAL 01/16/2010  ? ? ?Orientation RESPIRATION BLADDER Height & Weight   ?  ?Self, Time, Situation, Place ? Normal Continent, Indwelling catheter Weight: 241 lb 10 oz (109.6 kg) ?Height:  6' (182.9 cm)  ?BEHAVIORAL SYMPTOMS/MOOD NEUROLOGICAL BOWEL NUTRITION STATUS  ?    Colostomy    ?AMBULATORY STATUS COMMUNICATION OF NEEDS Skin   ?Limited Assist Verbally Surgical wounds (new colostomy) ?  ?  ?  ?    ?     ?     ? ? ?Personal Care Assistance Level of Assistance  ?Bathing, Dressing Bathing Assistance: Limited assistance ?  ?Dressing Assistance: Limited assistance ?   ? ?Functional Limitations Info  ?    ?  ?   ? ? ?SPECIAL CARE FACTORS FREQUENCY  ?PT (By licensed PT), OT (By licensed OT)   ?  ?PT Frequency: 5x/wk ?OT Frequency: 5x/wk ?  ?  ?  ?   ? ? ?Contractures Contractures Info: Not present  ? ? ?Additional Factors Info  ?Code Status, Allergies, Insulin Sliding Scale, Psychotropic Code Status Info: Full ?Allergies Info: NKDA ?Psychotropic Info: see MAR ?Insulin Sliding Scale Info: see MAR ?  ?   ? ?Current Medications (06/03/2021):  This is the current hospital active medication list ?Current Facility-Administered Medications  ?Medication Dose Route Frequency Provider Last Rate Last Admin  ? acetaminophen (TYLENOL) tablet 1,000 mg  1,000 mg Oral Q6H Michael Boston, MD   1,000 mg at 06/03/21 1142  ? albuterol (VENTOLIN HFA) 108 (90 Base) MCG/ACT inhaler 2 puff  2 puff Inhalation Q6H PRN Michael Boston, MD   2 puff at 06/03/21 1105  ? ALPRAZolam (XANAX) tablet 0.5 mg  0.5 mg Oral BID Michael Boston, MD   0.5 mg at 06/03/21 8185  ? alum & mag hydroxide-simeth (MAALOX/MYLANTA) 200-200-20 MG/5ML suspension 30 mL  30 mL Oral Q6H PRN Michael Boston, MD      ? aspirin EC tablet 81 mg  81 mg Oral Daily Michael Boston, MD   81 mg at 06/03/21 6314  ? Chlorhexidine Gluconate Cloth 2 % PADS 6 each  6 each Topical H7026 Michael Boston, MD   6 each at 06/03/21 0535  ? cholecalciferol (VITAMIN D)  tablet 2,000 Units  2,000 Units Oral Daily Michael Boston, MD   2,000 Units at 06/03/21 [phone]  ? diphenhydrAMINE (BENADRYL) 12.5 MG/5ML elixir 12.5 mg  12.5 mg Oral Q6H PRN Michael Boston, MD      ? Or  ? diphenhydrAMINE (BENADRYL) injection 12.5 mg  12.5 mg Intravenous Q6H PRN Michael Boston, MD      ? enoxaparin (LOVENOX) injection 40 mg  40 mg Subcutaneous Q24H Michael Boston, MD   40 mg at 06/03/21 0747  ? feeding supplement (GLUCERNA SHAKE) (GLUCERNA SHAKE) liquid 237 mL  237 mL Oral BID BM Michael Boston, MD   237 mL at 06/03/21 0938  ? ferrous sulfate tablet 325 mg  325 mg Oral BID WC Michael Boston, MD   325 mg at 06/03/21 0747  ? HYDROmorphone (DILAUDID) injection 0.5-2 mg  0.5-2 mg Intravenous Q4H PRN Michael Boston, MD   1 mg at 05/30/21 0840  ? insulin aspart (novoLOG) injection 0-15 Units  0-15 Units Subcutaneous TID WC Michael Boston, MD   3 Units at 06/03/21 1244  ? insulin aspart (novoLOG) injection 0-5 Units  0-5 Units Subcutaneous Ardeen Fillers, MD   2 Units at 05/30/21 2135  ? insulin aspart (novoLOG) injection 2 Units  2 Units Subcutaneous TID WC Charlynne Cousins, MD   2 Units at 06/03/21 1246  ? lactated ringers bolus 1,000 mL  1,000 mL Intravenous Daily Michael Boston, MD 999 mL/hr at 06/03/21 0949 1,000 mL at 06/03/21 0949  ? lip balm (CARMEX) ointment 1 application.  1 application. Topical BID Michael Boston, MD   1 application. at 06/03/21 8850  ? loperamide (IMODIUM) capsule 2 mg  2 mg Oral Q6H PRN Michael Boston, MD   2 mg at 05/30/21 1044  ? loperamide (IMODIUM) capsule 2 mg  2 mg Oral BID Michael Boston, MD   2 mg at 06/03/21 2774  ? magic mouthwash  15 mL Oral QID PRN Michael Boston, MD      ? melatonin tablet 3 mg  3 mg Oral QHS PRN Michael Boston, MD      ? metFORMIN (GLUCOPHAGE) tablet 500 mg  500 mg Oral BID WC Michael Boston, MD   500 mg at 06/03/21 0747  ? mirtazapine (REMERON) tablet 45 mg  45 mg Oral Ardeen Fillers, MD   45 mg at 06/02/21 2134  ? ondansetron (ZOFRAN) tablet 4 mg  4  mg Oral Q6H PRN Michael Boston, MD   4 mg at 05/29/21 2245  ? Or  ? ondansetron (ZOFRAN) injection 4 mg  4 mg Intravenous Q6H PRN Michael Boston, MD   4 mg at 05/30/21 1287  ? pantoprazole (PROTONIX) EC tablet 40 mg  40 mg Oral Q1200 Michael Boston, MD   40 mg at 06/03/21 1142  ? polycarbophil (FIBERCON) tablet 625 mg  625 mg Oral BID Michael Boston, MD   625 mg at 06/03/21 8676  ? potassium chloride SA (KLOR-CON M) CR tablet  20 mEq  20 mEq Oral Daily Michael Boston, MD   20 mEq at 06/03/21 2094  ? pravastatin (PRAVACHOL) tablet 40 mg  40 mg Oral Daily Michael Boston, MD   40 mg at 06/03/21 7096  ? prochlorperazine (COMPAZINE) tablet 10 mg  10 mg Oral Q6H PRN Michael Boston, MD      ? Or  ? prochlorperazine (COMPAZINE) injection 5-10 mg  5-10 mg Intravenous Q6H PRN Michael Boston, MD      ? propranolol ER (INDERAL LA) 24 hr capsule 80 mg  80 mg Oral Ardeen Fillers, MD      ? protein supplement (ENSURE MAX) liquid  11 oz Oral Daily Michael Boston, MD   11 oz at 06/02/21 2134  ? risperiDONE (RISPERDAL) tablet 0.25 mg  0.25 mg Oral Ardeen Fillers, MD   0.25 mg at 06/02/21 2134  ? simethicone (MYLICON) chewable tablet 40 mg  40 mg Oral Q6H PRN Michael Boston, MD   40 mg at 05/29/21 1511  ? sodium chloride flush (NS) 0.9 % injection 10-40 mL  10-40 mL Intracatheter Gorden Harms, MD   10 mL at 06/03/21 0948  ? sodium chloride flush (NS) 0.9 % injection 10-40 mL  10-40 mL Intracatheter PRN Michael Boston, MD   10 mL at 06/03/21 0947  ? SUMAtriptan (IMITREX) tablet 100 mg  100 mg Oral BID PRN Michael Boston, MD      ? tamsulosin (FLOMAX) capsule 0.4 mg  0.4 mg Oral QPC breakfast Michael Boston, MD   0.4 mg at 06/03/21 1245  ? traMADol (ULTRAM) tablet 50-100 mg  50-100 mg Oral Q6H PRN Michael Boston, MD   100 mg at 05/30/21 2836  ? ? ? ?Discharge Medications: ?Please see discharge summary for a list of discharge medications. ? ?Relevant Imaging Results: ? ?Relevant Lab Results: ? ? ?Additional Information ?SS# [id number];   Covid vax x 2 ? ?Regan Llorente, LCSW ? ? ? ? ?

## 2021-06-03 NOTE — Progress Notes (Signed)
? ?William Good ?[id number] ?1955-11-22 ? ?CARE TEAM: ? ?PCP: Aura Dials, MD ? ?Outpatient Care Team: Patient Care Team: ?Aura Dials, MD as PCP - General (Family Medicine) ?Michael Boston, MD as Consulting Physician (General Surgery) ?Otis Brace, MD as Consulting Physician (Gastroenterology) ?Ladell Pier, MD as Consulting Physician (Oncology) ?Kyung Rudd, MD as Consulting Physician (Radiation Oncology) ?Earl Gala, Deliah Goody, RN as Equities trader (Oncology) ?Larey Dresser, MD as Consulting Physician (Cardiology) ?Lucas Mallow, MD as Consulting Physician (Urology) ? ?Inpatient Treatment Team: Treatment Team: Attending Provider: Michael Boston, MD; [place] Nurse: Illene Regulus, RN; Consulting Physician: Ladell Pier, MD; Consulting Physician: Charlynne Cousins, MD; Registered Nurse: Lucia Bitter, RN; Occupational Therapist: Rosemary Holms, OT; Registered Nurse: Charlyne Petrin, RN; Utilization Review: Fortino Sic, RN ? ? ?Problem List:  ? ?Principal Problem: ?  Rectal cancer ([place]) ?Active Problems: ?  Chronic kidney disease, stage 3 unspecified ([place]) ?  AKI (acute kidney injury) ([place]) ?  Essential hypertension ?  Migraine ?  Type 2 diabetes mellitus with complication, without long-term current use of insulin ([place]) ?  Bipolar disorder ([place]) ?  Polycystic kidney ?  Coronary artery calcification ?  High output ileostomy ([place]) ? ? ?7 Days Post-Op  05/27/2021 ? ?POST-OPERATIVE DIAGNOSIS:  DISTAL RECTAL CANCER ?  ?PROCEDURE:   ?-ROBOTIC LOW ANTERIOR RECTOSIGMOID RESECTION ?-VERY LOW COLOANAL ANASTOMOSIS ?-DIVERTING LOOP ILEOSTOMY ?-INTRAOPERATIVE ASSESSMENT OF TISSUE VASCULAR PERFUSION USING ICG (indocyanine green) IMMUNOFLUORESCENCE ?-TRANSVERSUS ABDOMINIS PLANE (TAP) BLOCK - BILATERAL ?-RIGID PROCTOSCOPY ?  ?SURGEON:  Adin Hector, MD ? ?OR FINDINGS:  ?  ?Patient had fibrous scarring in the distal rectum 4-7 cm from the anal verge.  Some thin stricturing.  Consistent  with pretty good pathological response compared to the very bulky tumor at first.  Very fibrotic and narrow pelvis with bulky mesorectum. ?  ?No obvious metastatic disease on visceral parietal peritoneum or liver. ?  ?The anastomosis rests 2 cm from the anal verge by rigid proctoscopy.  There is a 31 EEA mid descending colon to anal rectal margin stump ? ?###################################### ? ?Preoperative diagnosis:  ?1. Ureteral identification for pelvic surgery ?  ?Postoperative diagnosis: ?1. same ?  ?Procedure(s): ?1. Cystoscopy with bilateral ureteral catheterization for firefly injection ?  ?Surgeon: Dr. Donald Pore ?  ? ?################################################ ? ?SURGICAL PATHOLOGY  ? ?FINAL MICROSCOPIC DIAGNOSIS:  ? ?A. COLON, RECTOSIGMOID, RESECTION:  ?- Fibrosis and slight inflammation consistent with treatment effect.  ?- No residual carcinoma identified.  ?- Margins negative for carcinoma.  ?- Five lymph nodes negative for metastatic carcinoma (0/5).  ?- See oncology table.  ? ?B. ANASTOMOSIS RING, DISTAL, EXCISION:  ?- Benign colon.  ?- No evidence of malignancy.  ? ?ONCOLOGY TABLE:  ?COLON AND RECTUM, CARCINOMA:  Resection  ?Regional Lymph Nodes:  ?     Number of Lymph Nodes with Tumor: 0  ?     Number of Lymph Nodes Examined: 5  ?Tumor Deposits: Not identified.  ?Pathologic Stage Classification (pTNM, AJCC 8th Edition): ypT0, ypN0  ? ?Assessment ? ?Recovering ? ?(Hospital Stay = 7 days) ? ?Plan: ? ?ERAS enhance recovery pathway ? ?Antidiarrhea regimen to control high output ileostomy.  Fiber twice daily.  Iron twice daily.  Imodiumtwice daily with as needed breakthrough .  Markedly improved ? ?Solid diet.  Calorie counts.  I worry his p.o. intake will be fair and he is at risk for getting dehydrated again.  We will see. ? ?AKI in the setting  of CKD3 ?-Most likely combination of dehydration from ileostomy and urinary retention ?-Remove Foley catheter removed and patient voiding.   ?Check  bladder postvoid residuals to make sure he is not retaining like he has before.   ?If urinating fine then see if we can avoid chronic Foley catheter  ?If cannot void then replace Foley and have urology follow-up for urinary studies in a few weeks. ?-Avoiding nephrotoxic agents ?-Nephrology saw over the weekend and is already signed off yesterday.  Reconsult if not better.  Hopefully less likely ? ?-Patient failed voiding trial with catheter out after 72 hours.  Replace.  Discussed with Dr. Gloriann Loan, his urologist.  He recommends starting Flomax and follow-up with urology clinic in 2 weeks for voiding trial versus cystoscopy. ? ? ? ?Patient high risk for dehydration with ileostomy and chronic kidney disease.  We will do 1 L LR daily through the Port-A-Cath x6 weeks to minimize this.   ? ?Diabetic control.  Sliding scale insulin started .  We will ask medicine to have input to help start regimen since he is not on anything.  Called [facility] - Dr Olevia Bowens recommends trial of metformin 500 twice daily.  We will start and monitor ? ?Bipolar disorder.-No mental status changes.  Continue baseline medication. ? ?Xanax for anxiety.  No breakthrough at this time.  Seems little sedated.  Cut back for now ? ?Follow-up on pathology - ypT1ypN0 -good pathological response.  Added patient to tumor board to discuss this week ? ?Patient had small EEA anastomotic leak controlled with drain and planned loop ileostomy diversion.  Drain clear for the past week and relatively pelvic.  Will remove to simplify things  ? ?ileostomy care and training.  I believe he would benefit from follow-up with the ostomy clinic.  We will see if we can get that arranged ? ?Have involvement with physical, occupational therapies.  Also case management since patient apparently lives at home but is borderline functioning/independent and brother most likely does not feel comfortable managing patient by himself.  Seems to be rather deconditioned.  I believe he will require  a SNF.  Impression his family is rather overwhelmed on trying to help take care of him.  Current therapies agree with that.  They will help follow. ? ?-VTE prophylaxis- SCDs, etc ? ?-mobilize as tolerated to help recovery.  Patient feeling a little wiped out.  Keep trying to mobilize as tolerated. ? ?Disposition: Ready discharge from my standpoint.  Orders for discharge placed ?Disposition:  ?The patient is from: Home ? ?Anticipate discharge to:  [facility] (SNF) ? ?Anticipated Date of Discharge is:  March 29,2023 ?  ? ?Barriers to discharge:  Pending Clinical improvement (more likely than not) ? ?Patient currently is NOT MEDICALLY STABLE for discharge from the hospital from a surgery standpoint. ? ? ? ? ? ?I reviewed nursing notes, last 24 h vitals and pain scores, last 48 h intake and output, last 24 h labs and trends, and last 24 h imaging results. I have reviewed this patient's available data, including medical history, events of note, test results, etc as part of my evaluation.  A significant portion of that time was spent in counseling.  Care during the described time interval was provided by me. ? ?This care required high  level of medical decision making.  06/03/2021 ? ? ? ?Subjective: ?(Chief complaint) ? ?Patient trying to take p.o. ? ?Foley catheter removed.  Nursing notes he is voiding on his own.  Patient confirms  that. ? ?No nausea or vomiting. ? ?Staying in bed mostly but starting to mobilize with therapies ? ?Objective: ? ?Vital signs: ? ?Vitals:  ? 06/02/21 0447 06/02/21 1337 06/02/21 2030 06/03/21 0644  ?BP: 134/81 123/80 118/74 119/66  ?Pulse: 82 74 84 76  ?Resp: '16 16 17 18  '$ ?Temp: 98.8 ?F (37.1 ?C) 98.7 ?F (37.1 ?C) 99.3 ?F (37.4 ?C) 98.7 ?F (37.1 ?C)  ?TempSrc: Oral Oral Oral Oral  ?SpO2: 95% 94% 96% 98%  ?Weight: 109.6 kg     ?Height:      ? ? ?Last BM Date : 06/02/21 ? ?Intake/Output  ? ?Yesterday: ? 03/28 0701 - 03/29 0700 ?In: 1960.9 [P.O.:960; IV Piggyback:1000.9] ?Out: 5374  [BRKVT:5521; Drains:150; Stool:625] ?This shift: ? No intake/output data recorded. ? ?Bowel function: ? Flatus: YES ? BM:  YES -mildlt thickened dark brown succus effluent ? Drain: [employer]

## 2021-06-03 NOTE — Progress Notes (Signed)
Calorie Count Note ?  ?48 hour calorie count ordered. ?  ?Diet: Heart Healthy ?Supplements:  ?-Glucerna Shake po BID, each supplement provides 220 kcal and 10 grams of protein  ?-Ensure MAX Protein po daily, each supplement provides 150 kcal and 30 grams of protein  ?  ?3/28: ?Breakfast: 250 kcals, 12g protein ?Lunch: nothing documented ?Dinner: 345 kcals, 9g protein ?Supplements: 1 Glucerna, 1/2 Ensure Max - 295 kcals, 25g protein ?  ?Total intake: ?890 kcal (40% of minimum estimated needs)  ?46g protein (38% of minimum estimated needs) ?  ?Nutrition Dx: Increased nutrient needs related to post-op healing, cancer and cancer related treatments as evidenced by estimated needs. ?  ?Goal: Pt to meet >/= 90% of their estimated nutrition needs  ?  ?Intervention:  ?-Glucerna Shake po BID, each supplement provides 220 kcal and 10 grams of protein  ?-Ensure Max daily, 150 kcals, 30g protein ?-Prosource Plus PO TID, each provides 100 kcals and 15g protein ?-Continue Calorie Count ?  ?Clayton Bibles, MS, RD, LDN ?Inpatient Clinical Dietitian ?Contact information available via Amion ?

## 2021-06-03 NOTE — Progress Notes (Signed)
The proposed treatment discussed in conference is for discussion purpose only and is not a binding recommendation.  The patients have not been physically examined, or presented with their treatment options.  Therefore, final treatment plans cannot be decided.  

## 2021-06-03 NOTE — Progress Notes (Signed)
Occupational Therapy Treatment ?Patient Details ?Name: William Good ?MRN: [id number] ?DOB: [birth year] ?Today's Date: 06/03/2021 ? ? ?History of present illness Pt s/p low anterior rectosigmoid resection, coloanal anastomisis, and diverting loop ileostomy 2* rectal CA.  Pt with CKD, DM, back surgery, R TKR and CKD ?  ?OT comments ? Patient needing verbal cues and min A to complete log roll and sit trunk up to edge of bed. With cues not to pull on walker patient overall min A for safety to transfer from bed to recliner. Patient reports dizziness in standing. After resting in chair BP taken 116/79. In standing BP 98/86 with patient's knees buckling at times. Made RN aware of BP changes with sit to stand. Patient set up with breakfast in recliner with symptoms resolved.   ? ?Recommendations for follow up therapy are one component of a multi-disciplinary discharge planning process, led by the attending physician.  Recommendations may be updated based on patient status, additional functional criteria and insurance authorization. ?   ?Follow Up Recommendations ? Skilled nursing-short term rehab (<3 hours/day)  ?  ?Assistance Recommended at Discharge Frequent or constant Supervision/Assistance  ?Patient can return home with the following ? A little help with walking and/or transfers;A lot of help with bathing/dressing/bathroom;Assistance with cooking/housework;Assist for transportation;Help with stairs or ramp for entrance ?  ?Equipment Recommendations ? None recommended by OT  ?  ?   ?Precautions / Restrictions Precautions ?Precautions: Fall ?Precaution Comments: new ileostomy, JP drain on R, ?Restrictions ?Weight Bearing Restrictions: No  ? ? ?  ? ?Mobility Bed Mobility ?Overal bed mobility: Needs Assistance ?Bed Mobility: Sidelying to Sit, Rolling ?Rolling: Min guard ?Sidelying to sit: Min assist, HOB elevated ?  ?  ?  ?General bed mobility comments: Cues for log rolling, min A to upright trunk ?  ? ? ?  ?Balance  Overall balance assessment: Needs assistance ?Sitting-balance support: Feet supported ?Sitting balance-Leahy Scale: Fair ?  ?  ?Standing balance support: Reliant on assistive device for balance ?Standing balance-Leahy Scale: Poor ?  ?  ?  ?  ?  ?  ?  ?  ?  ?  ?  ?  ?   ? ?ADL either performed or assessed with clinical judgement  ? ?ADL Overall ADL's : Needs assistance/impaired ?  ?  ?Grooming: Wash/dry hands;Set up;Sitting ?  ?  ?  ?  ?  ?  ?  ?  ?  ?Toilet Transfer: Minimal assistance;Cueing for sequencing;Cueing for safety;Stand-pivot;Rolling walker (2 wheels) ?Toilet Transfer Details (indicate cue type and reason): To recliner chair, min A for safety as patient is tremulous at times. Endorses dizziness with 18 point systolic drop in standing compared to sitting with RN made aware. ?  ?  ?  ?  ?Functional mobility during ADLs: Minimal assistance;Cueing for safety;Cueing for sequencing;Rolling walker (2 wheels) ?  ?  ? ? ? ?Cognition Arousal/Alertness: Awake/alert ?Behavior During Therapy: Flat affect ?Overall Cognitive Status: Within Functional Limits for tasks assessed ?  ?  ?  ?  ?  ?  ?  ?  ?  ?  ?  ?  ?  ?  ?  ?  ?  ?  ?  ?   ?   ?   ?   ? ? ?Pertinent Vitals/ Pain       Pain Assessment ?Pain Assessment: Faces ?Faces Pain Scale: Hurts little more ?Pain Location: abdomen with movement ?Pain Descriptors / Indicators: Grimacing, Guarding ?Pain Intervention(s): Monitored during session ? ?   ?   ? ?  Frequency ? Min 2X/week  ? ? ? ? ?  ?Progress Toward Goals ? ?OT Goals(current goals can now be found in the care plan section) ? Progress towards OT goals: Progressing toward goals ? ?Acute Rehab OT Goals ?Patient Stated Goal: go to rehab ?OT Goal Formulation: With patient ?Time For Goal Achievement: 06/11/21 ?Potential to Achieve Goals: Good ?ADL Goals ?Pt Will Perform Lower Body Dressing: with modified independence;sit to/from stand ?Pt Will Transfer to Toilet: with supervision;ambulating;regular height toilet;grab  bars ?Pt Will Perform Toileting - Clothing Manipulation and hygiene: with supervision;sit to/from stand ?Additional ADL Goal #1: Patient will stand at sink to perform grooming task as evidence of improving activity tolerance  ?Plan Discharge plan remains appropriate   ? ?   ?AM-PAC OT "6 Clicks" Daily Activity     ?Outcome Measure ? ? Help from another person eating meals?: A Little ?Help from another person taking care of personal grooming?: A Little ?Help from another person toileting, which includes using toliet, bedpan, or urinal?: A Lot ?Help from another person bathing (including washing, rinsing, drying)?: A Lot ?Help from another person to put on and taking off regular upper body clothing?: A Lot ?Help from another person to put on and taking off regular lower body clothing?: A Lot ?6 Click Score: 14 ? ?  ?End of Session Equipment Utilized During Treatment: Rolling walker (2 wheels);Gait belt ? ?OT Visit Diagnosis: Muscle weakness (generalized) (M62.81) ?  ?Activity Tolerance Patient tolerated treatment well ?  ?Patient Left in chair;with call bell/phone within reach;with chair alarm set ?  ?Nurse Communication Mobility status;Other (comment) (BP) ?  ? ?   ? ?Time: 8882-8003 ?OT Time Calculation (min): 18 min ? ?Charges: OT General Charges ?$OT Visit: 1 Visit ?OT Treatments ?$Self Care/Home Management : 8-22 mins ? ?Delbert Phenix OT ?OT pager: [phone] ? ? ?Rosemary Holms ?06/03/2021, 11:20 AM ?

## 2021-06-04 LAB — GLUCOSE, CAPILLARY
Glucose-Capillary: 163 mg/dL — ABNORMAL HIGH (ref 70–99)
Glucose-Capillary: 182 mg/dL — ABNORMAL HIGH (ref 70–99)
Glucose-Capillary: 114 mg/dL — ABNORMAL HIGH (ref 70–99)
Glucose-Capillary: 192 mg/dL — ABNORMAL HIGH (ref 70–99)

## 2021-06-04 LAB — CREATININE, SERUM
Creatinine, Ser: 1.26 mg/dL — ABNORMAL HIGH (ref 0.61–1.24)
GFR, Estimated: >60 mL/min (ref >60)

## 2021-06-04 LAB — POTASSIUM: Potassium: 3.8 mmol/L (ref 3.5–5.1)

## 2021-06-04 NOTE — Progress Notes (Signed)
Physical Therapy Treatment ?Patient Details ?Name: William Good ?MRN: [id number] ?DOB: Aug 02, 1955 ?Today's Date: 06/04/2021 ? ? ?History of Present Illness Pt s/p low anterior rectosigmoid resection, coloanal anastomisis, and diverting loop ileostomy 2* rectal CA.  Pt with CKD, DM, back surgery, R TKR and CKD ? ?  ?PT Comments  ? ? General Comments: AxO x 3 a little flat but following all directions and converasive.  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 ?Assisted OOB to amb required increased time and effort.  General bed mobility comments: required increased asisst die to increased ABD pain as well as increased time to transition to EOB and support b LE back to bed (recent ABD surgery)   General transfer comment: 50% VC's on proper hand placement to avoid pulling up on walker.  25% VC's on safety with turns using walker.  Poor forward flex posture. General Gait Details: decreased amb distance due to increased c/o weakness/fatigue.  Slow sluggish gait with heavy lean on walker. ?Pt will need ST Rehab at SNF prior to safely retuning home. ?  ?Recommendations for follow up therapy are one component of a multi-disciplinary discharge planning process, led by the attending physician.  Recommendations may be updated based on patient status, additional functional criteria and insurance authorization. ? ?Follow Up Recommendations ? Skilled nursing-short term rehab (<3 hours/day) ?  ?  ?Assistance Recommended at Discharge Frequent or constant Supervision/Assistance  ?Patient can return home with the following A lot of help with walking and/or transfers;A lot of help with bathing/dressing/bathroom;Assistance with cooking/housework;Assist for transportation;Help with stairs or ramp for entrance ?  ?Equipment Recommendations ? None recommended by PT  ?  ?Recommendations for Other Services   ? ? ?  ?Precautions / Restrictions Precautions ?Precautions: Fall ?Precaution Comments: new  ileostomy ?Restrictions ?Weight Bearing Restrictions: No  ?  ? ?Mobility ? Bed Mobility ?Overal bed mobility: Needs Assistance ?Bed Mobility: Supine to Sit, Sit to Supine ?  ?  ?Supine to sit: Mod assist, Max assist ?Sit to supine: Mod assist, Max assist ?  ?General bed mobility comments: required increased asisst die to increased ABD pain as well as increased time to transition to EOB and support b LE back to bed (recent ABD surgery) ?  ? ?Transfers ?Overall transfer level: Needs assistance ?Equipment used: Rolling walker (2 wheels) ?Transfers: Sit to/from Stand ?Sit to Stand: Mod assist, Min assist ?  ?  ?  ?  ?  ?General transfer comment: 50% VC's on proper hand placement to avoid pulling up on walker.  25% VC's on safety with turns using walker.  Poor forward flex posture. ?  ? ?Ambulation/Gait ?Ambulation/Gait assistance: Min assist, Mod assist ?Gait Distance (Feet): 38 Feet ?Assistive device: Rolling walker (2 wheels) ?Gait Pattern/deviations: Step-to pattern, Decreased step length - right, Decreased step length - left, Shuffle ?Gait velocity: decreased ?  ?  ?General Gait Details: decreased amb distance due to increased c/o weakness/fatigue.  Slow sluggish gait with heavy lean on walker. ? ? ?Stairs ?  ?  ?  ?  ?  ? ? ?Wheelchair Mobility ?  ? ?Modified Rankin (Stroke Patients Only) ?  ? ? ?  ?Balance   ?  ?  ?  ?  ?  ?  ?  ?  ?  ?  ?  ?  ?  ?  ?  ?  ?  ?  ?  ? ?  ?Cognition Arousal/Alertness: Awake/alert ?Behavior During Therapy: [facility] for tasks assessed/performed ?Overall Cognitive Status:  Within Functional Limits for tasks assessed ?  ?  ?  ?  ?  ?  ?  ?  ?  ?  ?  ?  ?  ?  ?  ?  ?General Comments: AxO x 3 a little flat but following all directions and converasive.  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 ?  ?  ? ?  ?Exercises   ? ?  ?General Comments   ?  ?  ? ?Pertinent Vitals/Pain Pain Assessment ?Pain Assessment: Faces ?Faces Pain Scale: Hurts even more ?Pain Location: abdomen with movement L  LQ ?Pain Descriptors / Indicators: Grimacing, Guarding ?Pain Intervention(s): Monitored during session  ? ? ?Home Living   ?  ?  ?  ?  ?  ?  ?  ?  ?  ?   ?  ?Prior Function    ?  ?  ?   ? ?PT Goals (current goals can now be found in the care plan section) Progress towards PT goals: Progressing toward goals ? ?  ?Frequency ? ? ? Min 3X/week ? ? ? ?  ?PT Plan Current plan remains appropriate  ? ? ?Co-evaluation   ?  ?  ?  ?  ? ?  ?AM-PAC PT "6 Clicks" Mobility   ?Outcome Measure ? Help needed turning from your back to your side while in a flat bed without using bedrails?: A Lot ?Help needed moving from lying on your back to sitting on the side of a flat bed without using bedrails?: A Lot ?Help needed moving to and from a bed to a chair (including a wheelchair)?: A Lot ?Help needed standing up from a chair using your arms (e.g., wheelchair or bedside chair)?: A Lot ?Help needed to walk in hospital room?: A Lot ?Help needed climbing 3-5 steps with a railing? : A Lot ?6 Click Score: 12 ? ?  ?End of Session Equipment Utilized During Treatment: Gait belt ?Activity Tolerance: Patient limited by fatigue ?Patient left: in bed;with call bell/phone within reach;with bed alarm set;with family/visitor present ?Nurse Communication: Mobility status ?PT Visit Diagnosis: Unsteadiness on feet (R26.81);History of falling (Z91.81);Difficulty in walking, not elsewhere classified (R26.2);Pain ?  ? ? ?Time: 4235-3614 ?PT Time Calculation (min) (ACUTE ONLY): 24 min ? ?Charges:  $Gait Training: 8-22 mins ?$Therapeutic Activity: 8-22 mins          ?          ? ?Rica Koyanagi  PTA ?Acute  Rehabilitation Services ?Pager      [phone] ?Office      [phone] ? ? ?

## 2021-06-04 NOTE — TOC Progression Note (Signed)
Transition of Care (TOC) - Progression Note  ? ? ?Patient Details  ?Name: William Good ?MRN: [id number] ?Date of Birth: 1955/10/05 ? ?Transition of Care (TOC) CM/SW Contact  ?Anairis Knick, LCSW ?Phone Number: ?06/04/2021, 3:47 PM ? ?Clinical Narrative:    ?Have reviewed SNF bed offers received today with pt and brother, [name] and they have accepted offer at [facility] who can admit pt tomorrow. Will begin insurance authorization.  Have alerted [facility] office to inform Dr. Johney Maine. ? ? ?Expected Discharge Plan: [place] ?Barriers to Discharge: Continued Medical Work up, Ship broker, [place] (PASRR), SNF Pending bed offer ? ?Expected Discharge Plan and Services ?Expected Discharge Plan: [place] ?In-house Referral: Clinical Social Work ?  ?  ?Living arrangements for the past 2 months: [place] ?Expected Discharge Date: 06/03/21               ?DME Arranged: N/A ?DME Agency: NA ?  ?  ?  ?  ?  ?  ?  ?  ? ? ?Social Determinants of Health (SDOH) Interventions ?  ? ?Readmission Risk Interventions ? ?  05/29/2021  ?  2:21 PM  ?Readmission Risk Prevention Plan  ?Transportation Screening Complete  ?PCP or Specialist Appt within 3-5 Days Complete  ?[place] or Home Care Consult Complete  ?Social Work Consult for [place] Planning/Counseling Complete  ?Palliative Care Screening Not Applicable  ? ? ?

## 2021-06-04 NOTE — Consult Note (Signed)
[facility] Nurse ostomy follow up ?Stoma type/location: RLQ loop ileostomy ?Stomal assessment/size: oblong, 2 inches x 1 and 5/8 inches. Proximal os with lumen at 6 o'clock; red, edematous, moist, with blood clots at 3 and 9 o'clock. ?Peristomal assessment: intact ?Treatment options for stomal/peristomal skin: skin barrier ring ?Output 50 mls of soft brown stool ?Ostomy pouching: 2pc. 2 and 3/4 inch pouching system with skin barrier ring ?Education provided: None today. Patient is to discharge to a skill nursing facility.  Supplies ( 5 set ups) readied for discharge when bed offer is made. Teaching materials in folder at bedside.  ?Enrolled patient in [place] Start Discharge program: Yes  ? ?Next scheduled pouch change is Monday, 06/08/21. ? ?[facility] nursing team will follow, and will remain available to this patient, the nursing and medical teams.   ?Thanks, ?Maudie Flakes, MSN, RN, [place], [place], CWON-AP, [place]  ?Pager# [phone]  ?

## 2021-06-05 DIAGNOSIS — I10 Essential (primary) hypertension: Secondary | ICD-10-CM | POA: Diagnosis not present

## 2021-06-05 DIAGNOSIS — R1011 Right upper quadrant pain: Secondary | ICD-10-CM | POA: Diagnosis not present

## 2021-06-05 DIAGNOSIS — E1122 Type 2 diabetes mellitus with diabetic chronic kidney disease: Secondary | ICD-10-CM | POA: Diagnosis not present

## 2021-06-05 DIAGNOSIS — I700 Atherosclerosis of aorta: Secondary | ICD-10-CM | POA: Diagnosis not present

## 2021-06-05 DIAGNOSIS — U071 COVID-19: Secondary | ICD-10-CM | POA: Diagnosis not present

## 2021-06-05 DIAGNOSIS — R059 Cough, unspecified: Secondary | ICD-10-CM | POA: Diagnosis not present

## 2021-06-05 DIAGNOSIS — D62 Acute posthemorrhagic anemia: Secondary | ICD-10-CM | POA: Diagnosis not present

## 2021-06-05 DIAGNOSIS — E119 Type 2 diabetes mellitus without complications: Secondary | ICD-10-CM | POA: Diagnosis not present

## 2021-06-05 DIAGNOSIS — Z741 Need for assistance with personal care: Secondary | ICD-10-CM | POA: Diagnosis not present

## 2021-06-05 DIAGNOSIS — R140 Abdominal distension (gaseous): Secondary | ICD-10-CM | POA: Diagnosis not present

## 2021-06-05 DIAGNOSIS — N183 Chronic kidney disease, stage 3 unspecified: Secondary | ICD-10-CM | POA: Diagnosis not present

## 2021-06-05 DIAGNOSIS — E1129 Type 2 diabetes mellitus with other diabetic kidney complication: Secondary | ICD-10-CM | POA: Diagnosis not present

## 2021-06-05 DIAGNOSIS — Z7401 Bed confinement status: Secondary | ICD-10-CM | POA: Diagnosis not present

## 2021-06-05 DIAGNOSIS — E1169 Type 2 diabetes mellitus with other specified complication: Secondary | ICD-10-CM | POA: Diagnosis not present

## 2021-06-05 DIAGNOSIS — R404 Transient alteration of awareness: Secondary | ICD-10-CM | POA: Diagnosis not present

## 2021-06-05 DIAGNOSIS — R0602 Shortness of breath: Secondary | ICD-10-CM | POA: Diagnosis not present

## 2021-06-05 DIAGNOSIS — R109 Unspecified abdominal pain: Secondary | ICD-10-CM | POA: Diagnosis not present

## 2021-06-05 DIAGNOSIS — Z743 Need for continuous supervision: Secondary | ICD-10-CM | POA: Diagnosis not present

## 2021-06-05 DIAGNOSIS — E1159 Type 2 diabetes mellitus with other circulatory complications: Secondary | ICD-10-CM | POA: Diagnosis not present

## 2021-06-05 DIAGNOSIS — K219 Gastro-esophageal reflux disease without esophagitis: Secondary | ICD-10-CM | POA: Diagnosis not present

## 2021-06-05 DIAGNOSIS — E43 Unspecified severe protein-calorie malnutrition: Secondary | ICD-10-CM | POA: Diagnosis not present

## 2021-06-05 DIAGNOSIS — R198 Other specified symptoms and signs involving the digestive system and abdomen: Secondary | ICD-10-CM | POA: Diagnosis not present

## 2021-06-05 DIAGNOSIS — M6281 Muscle weakness (generalized): Secondary | ICD-10-CM | POA: Diagnosis not present

## 2021-06-05 DIAGNOSIS — R7982 Elevated C-reactive protein (CRP): Secondary | ICD-10-CM | POA: Diagnosis not present

## 2021-06-05 DIAGNOSIS — E559 Vitamin D deficiency, unspecified: Secondary | ICD-10-CM | POA: Diagnosis not present

## 2021-06-05 DIAGNOSIS — D6869 Other thrombophilia: Secondary | ICD-10-CM | POA: Diagnosis not present

## 2021-06-05 DIAGNOSIS — Z932 Ileostomy status: Secondary | ICD-10-CM | POA: Diagnosis not present

## 2021-06-05 DIAGNOSIS — N179 Acute kidney failure, unspecified: Secondary | ICD-10-CM | POA: Diagnosis not present

## 2021-06-05 DIAGNOSIS — I25110 Atherosclerotic heart disease of native coronary artery with unstable angina pectoris: Secondary | ICD-10-CM | POA: Diagnosis not present

## 2021-06-05 DIAGNOSIS — G43009 Migraine without aura, not intractable, without status migrainosus: Secondary | ICD-10-CM | POA: Diagnosis not present

## 2021-06-05 DIAGNOSIS — C20 Malignant neoplasm of rectum: Secondary | ICD-10-CM | POA: Diagnosis not present

## 2021-06-05 DIAGNOSIS — Z978 Presence of other specified devices: Secondary | ICD-10-CM | POA: Diagnosis not present

## 2021-06-05 DIAGNOSIS — R2681 Unsteadiness on feet: Secondary | ICD-10-CM | POA: Diagnosis not present

## 2021-06-05 DIAGNOSIS — I422 Other hypertrophic cardiomyopathy: Secondary | ICD-10-CM | POA: Diagnosis not present

## 2021-06-05 DIAGNOSIS — Z9181 History of falling: Secondary | ICD-10-CM | POA: Diagnosis not present

## 2021-06-05 DIAGNOSIS — R279 Unspecified lack of coordination: Secondary | ICD-10-CM | POA: Diagnosis not present

## 2021-06-05 DIAGNOSIS — R310 Gross hematuria: Secondary | ICD-10-CM | POA: Diagnosis not present

## 2021-06-05 DIAGNOSIS — E118 Type 2 diabetes mellitus with unspecified complications: Secondary | ICD-10-CM | POA: Diagnosis not present

## 2021-06-05 DIAGNOSIS — E785 Hyperlipidemia, unspecified: Secondary | ICD-10-CM | POA: Diagnosis not present

## 2021-06-05 DIAGNOSIS — R262 Difficulty in walking, not elsewhere classified: Secondary | ICD-10-CM | POA: Diagnosis not present

## 2021-06-05 DIAGNOSIS — N1832 Chronic kidney disease, stage 3b: Secondary | ICD-10-CM | POA: Diagnosis not present

## 2021-06-05 DIAGNOSIS — G2581 Restless legs syndrome: Secondary | ICD-10-CM | POA: Diagnosis not present

## 2021-06-05 DIAGNOSIS — R1312 Dysphagia, oropharyngeal phase: Secondary | ICD-10-CM | POA: Diagnosis not present

## 2021-06-05 LAB — CREATININE, SERUM
Creatinine, Ser: 1.27 mg/dL — ABNORMAL HIGH (ref 0.61–1.24)
GFR, Estimated: >60 mL/min (ref >60)

## 2021-06-05 LAB — GLUCOSE, CAPILLARY
Glucose-Capillary: 175 mg/dL — ABNORMAL HIGH (ref 70–99)
Glucose-Capillary: 186 mg/dL — ABNORMAL HIGH (ref 70–99)

## 2021-06-05 LAB — POTASSIUM: Potassium: 3.8 mmol/L (ref 3.5–5.1)

## 2021-06-05 MED ORDER — HEPARIN SOD (PORK) LOCK FLUSH 100 UNIT/ML IV SOLN
500.0000 [IU] | INTRAVENOUS | Status: AC | PRN
  Administered 2021-06-05: 500 [IU]
  Filled 2021-06-05: qty 5

## 2021-06-05 NOTE — Plan of Care (Signed)
?  Problem: Education: ?Goal: Knowledge of General Education information will improve ?Description: Including pain rating scale, medication(s)/side effects and non-pharmacologic comfort measures ?Outcome: Adequate for Discharge ?  ?Problem: Health Behavior/Discharge Planning: ?Goal: Ability to manage health-related needs will improve ?Outcome: Adequate for Discharge ?  ?Problem: Clinical Measurements: ?Goal: Ability to maintain clinical measurements within normal limits will improve ?Outcome: Adequate for Discharge ?Goal: Will remain free from infection ?Outcome: Adequate for Discharge ?Goal: Diagnostic test results will improve ?Outcome: Adequate for Discharge ?Goal: Respiratory complications will improve ?Outcome: Adequate for Discharge ?Goal: Cardiovascular complication will be avoided ?Outcome: Adequate for Discharge ?  ?Problem: Activity: ?Goal: Risk for activity intolerance will decrease ?Outcome: Adequate for Discharge ?  ?Problem: Nutrition: ?Goal: Adequate nutrition will be maintained ?Outcome: Adequate for Discharge ?  ?Problem: Coping: ?Goal: Level of anxiety will decrease ?Outcome: Adequate for Discharge ?  ?Problem: Elimination: ?Goal: Will not experience complications related to bowel motility ?Outcome: Adequate for Discharge ?Goal: Will not experience complications related to urinary retention ?Outcome: Adequate for Discharge ?  ?Problem: Pain Managment: ?Goal: General experience of comfort will improve ?Outcome: Adequate for Discharge ?  ?Problem: Safety: ?Goal: Ability to remain free from injury will improve ?Outcome: Adequate for Discharge ?  ?Problem: Skin Integrity: ?Goal: Risk for impaired skin integrity will decrease ?Outcome: Adequate for Discharge ?  ?Problem: Education: ?Goal: Required Educational Video(s) ?Outcome: Adequate for Discharge ?  ?Problem: Clinical Measurements: ?Goal: Ability to maintain clinical measurements within normal limits will improve ?Outcome: Adequate for  Discharge ?Goal: Postoperative complications will be avoided or minimized ?Outcome: Adequate for Discharge ?  ?

## 2021-06-05 NOTE — Care Management Important Message (Signed)
Important Message ? ?Patient Details IM Letter given to the Patient. ?Name: William Good ?MRN: [id number] ?Date of Birth: 01-Mar-1956 ? ? ?Medicare Important Message Given:  Yes ? ? ? ? ?Kerin Salen ?06/05/2021, 12:37 PM ?

## 2021-06-05 NOTE — TOC Transition Note (Signed)
Transition of Care (TOC) - CM/SW Discharge Note ? ? ?Patient Details  ?Name: William Good ?MRN: [id number] ?Date of Birth: [birth year] ? ?Transition of Care (TOC) CM/SW Contact:  ?Dartanion Teo, LCSW ?Phone Number: ?06/05/2021, 11:32 AM ? ? ?Clinical Narrative:    ?Have received insurance authorization for [facility] (auth# [id number]) and pt medically cleared for dc today.  Pt and family aware and agreeable.  PTAR called at 11:30am.  RN to call report to [phone].  No further TOC needs. ? ? ?Final next level of care: [place] ?Barriers to Discharge: Barriers Resolved ? ? ?Patient Goals and CMS Choice ?Patient states their goals for this hospitalization and ongoing recovery are:: to return home following SNF rehab ?  ?  ? ?Discharge Placement ?PASRR number recieved: 06/03/21 ?           ?Patient chooses bed at: [facility] ?Patient to be transferred to facility by: PTAR ?Name of family member notified: brother, [name] ?Patient and family notified of of transfer: 06/05/21 ? ?Discharge Plan and Services ?In-house Referral: Clinical Social Work ?  ?           ?DME Arranged: N/A ?DME Agency: NA ?  ?  ?  ?  ?  ?  ?  ?  ? ?Social Determinants of Health (SDOH) Interventions ?  ? ? ?Readmission Risk Interventions ? ?  05/29/2021  ?  2:21 PM  ?Readmission Risk Prevention Plan  ?Transportation Screening Complete  ?PCP or Specialist Appt within 3-5 Days Complete  ?[place] or Home Care Consult Complete  ?Social Work Consult for [place] Planning/Counseling Complete  ?Palliative Care Screening Not Applicable  ? ? ? ? ? ?

## 2021-06-05 NOTE — Progress Notes (Signed)
? ?TIVIS WHERRY ?[id number] ?1955-06-30 ? ?CARE TEAM: ? ?PCP: Aura Dials, MD ? ?Outpatient Care Team: Patient Care Team: ?Aura Dials, MD as PCP - General (Family Medicine) ?Michael Boston, MD as Consulting Physician (General Surgery) ?Otis Brace, MD as Consulting Physician (Gastroenterology) ?Ladell Pier, MD as Consulting Physician (Oncology) ?Kyung Rudd, MD as Consulting Physician (Radiation Oncology) ?Earl Gala, Deliah Goody, RN as Equities trader (Oncology) ?Larey Dresser, MD as Consulting Physician (Cardiology) ?Lucas Mallow, MD as Consulting Physician (Urology) ? ?Inpatient Treatment Team: Treatment Team: Attending Provider: Michael Boston, MD; [place] Nurse: Illene Regulus, RN; Consulting Physician: Ladell Pier, MD; Consulting Physician: Charlynne Cousins, MD; Consulting Physician: Lucas Mallow, MD; Registered Nurse: Charlyne Petrin, RN; Utilization Review: Barbara Cower, RN; Case Manager: Tawanna Cooler, RN ? ? ?Problem List:  ? ?Principal Problem: ?  Rectal cancer ([place]) ?Active Problems: ?  Chronic kidney disease, stage 3 unspecified ([place]) ?  AKI (acute kidney injury) ([place]) ?  Essential hypertension ?  Migraine ?  Type 2 diabetes mellitus with complication, without long-term current use of insulin ([place]) ?  Bipolar disorder ([place]) ?  Polycystic kidney ?  Coronary artery calcification ?  High output ileostomy ([place]) ? ? ?9 Days Post-Op  05/27/2021 ? ?POST-OPERATIVE DIAGNOSIS:  DISTAL RECTAL CANCER ?  ?PROCEDURE:   ?-ROBOTIC LOW ANTERIOR RECTOSIGMOID RESECTION ?-VERY LOW COLOANAL ANASTOMOSIS ?-DIVERTING LOOP ILEOSTOMY ?-INTRAOPERATIVE ASSESSMENT OF TISSUE VASCULAR PERFUSION USING ICG (indocyanine green) IMMUNOFLUORESCENCE ?-TRANSVERSUS ABDOMINIS PLANE (TAP) BLOCK - BILATERAL ?-RIGID PROCTOSCOPY ?  ?SURGEON:  Adin Hector, MD ? ?OR FINDINGS:  ?  ?Patient had fibrous scarring in the distal rectum 4-7 cm from the anal verge.  Some thin stricturing.  Consistent with  pretty good pathological response compared to the very bulky tumor at first.  Very fibrotic and narrow pelvis with bulky mesorectum. ?  ?No obvious metastatic disease on visceral parietal peritoneum or liver. ?  ?The anastomosis rests 2 cm from the anal verge by rigid proctoscopy.  There is a 31 EEA mid descending colon to anal rectal margin stump ? ?###################################### ? ?Preoperative diagnosis:  ?1. Ureteral identification for pelvic surgery ?  ?Postoperative diagnosis: ?1. same ?  ?Procedure(s): ?1. Cystoscopy with bilateral ureteral catheterization for firefly injection ?  ?Surgeon: Dr. Donald Pore ?  ? ?################################################ ? ?SURGICAL PATHOLOGY  ? ?FINAL MICROSCOPIC DIAGNOSIS:  ? ?A. COLON, RECTOSIGMOID, RESECTION:  ?- Fibrosis and slight inflammation consistent with treatment effect.  ?- No residual carcinoma identified.  ?- Margins negative for carcinoma.  ?- Five lymph nodes negative for metastatic carcinoma (0/5).  ?- See oncology table.  ? ?B. ANASTOMOSIS RING, DISTAL, EXCISION:  ?- Benign colon.  ?- No evidence of malignancy.  ? ?ONCOLOGY TABLE:  ?COLON AND RECTUM, CARCINOMA:  Resection  ?Regional Lymph Nodes:  ?     Number of Lymph Nodes with Tumor: 0  ?     Number of Lymph Nodes Examined: 5  ?Tumor Deposits: Not identified.  ?Pathologic Stage Classification (pTNM, AJCC 8th Edition): ypT0, ypN0  ? ?Assessment ? ?Recovering -awaiting skilled facility placement ? ?(Hospital Stay = 9 days) ? ?Plan: ? ?ERAS enhance recovery pathway ? ?Antidiarrhea regimen to control high output ileostomy.  Fiber twice daily.  Iron twice daily.  Imodium twice daily with as needed breakthrough .  Markedly improved ? ?Solid diet.  Calorie counts.  I worry his p.o. intake will be fair and he is at risk for getting dehydrated again.  We will  see. ? ?AKI in the setting of CKD3 ?-Most likely combination of dehydration from ileostomy and urinary retention ?-Remove Foley catheter  removed and patient voiding.   ?Check bladder postvoid residuals to make sure he is not retaining like he has before.   ?If urinating fine then see if we can avoid chronic Foley catheter  ?If cannot void then replace Foley and have urology follow-up for urinary studies in a few weeks. ?-Avoiding nephrotoxic agents ?-Nephrology saw over the weekend and is already signed off yesterday.  Reconsult if not better.  Hopefully less likely ? ?-Patient failed voiding trial with catheter out after 72 hours.  Replace.  Discussed with Dr. Gloriann Loan, his urologist.  He recommends starting Flomax and follow-up with urology clinic in 2 weeks for voiding trial versus cystoscopy. ? ? ? ?Patient high risk for dehydration with ileostomy and chronic kidney disease.  We will do 1 L LR daily through the Port-A-Cath x6 weeks to minimize this.   ? ?Diabetic control.  Sliding scale insulin started .  We will ask medicine to have input to help start regimen since he is not on anything.  Called [facility] - Dr Olevia Bowens recommends trial of metformin 500 twice daily.  We will start and monitor ? ?Bipolar disorder.-No mental status changes.  Continue baseline medication. ? ?Xanax for anxiety.  No breakthrough at this time.  Seems little sedated.  Cut back for now ? ?Follow-up on pathology - ypT1ypN0 -good pathological response.  Added patient to tumor board to discuss this week ? ?Patient had small EEA anastomotic leak controlled with drain and planned loop ileostomy diversion.  Drain clear for the past week and relatively pelvic.  Will remove to simplify things  ? ?ileostomy care and training.  I believe he would benefit from follow-up with the ostomy clinic.  We will see if we can get that arranged ? ?Have involvement with physical, occupational therapies.  Also case management since patient apparently lives at home but is borderline functioning/independent and brother most likely does not feel comfortable managing patient by himself.  Seems to be rather  deconditioned.  I believe he will require a SNF.  Impression his family is rather overwhelmed on trying to help take care of him.  Current therapies agree with that.  They will help follow. ? ?-VTE prophylaxis- SCDs, etc ? ?-mobilize as tolerated to help recovery.  Patient feeling a little wiped out.  Keep trying to mobilize as tolerated. ? ?Disposition: Ready discharge from my standpoint.  Orders for discharge placed ?Disposition:  ?The patient is from: Home ? ?Anticipate discharge to:  [facility] (SNF) ? ?Anticipated Date of Discharge is:  March 29,2023 ?  ? ?Barriers to discharge:  Pending Clinical improvement (more likely than not) ? ?Patient currently is NOT MEDICALLY STABLE for discharge from the hospital from a surgery standpoint. ? ? ? ? ? ?I reviewed nursing notes, last 24 h vitals and pain scores, last 48 h intake and output, last 24 h labs and trends, and last 24 h imaging results. I have reviewed this patient's available data, including medical history, events of note, test results, etc as part of my evaluation.  A significant portion of that time was spent in counseling.  Care during the described time interval was provided by me. ? ?This care required high  level of medical decision making.  06/05/2021 ? ? ? ?Subjective: ?(Chief complaint) ? ?Patient with slightly better appetite.  No major events ? ?Denies any severe pain or  bloating ? ?Objective: ? ?Vital signs: ? ?Vitals:  ? 06/04/21 0525 06/04/21 1312 06/04/21 2055 06/05/21 0650  ?BP: 131/80 121/69 (!) 143/79 127/72  ?Pulse: 85 79 92 81  ?Resp: '18 18 18 18  '$ ?Temp: 98.6 ?F (37 ?C) 99 ?F (37.2 ?C) 98.2 ?F (36.8 ?C) 98.6 ?F (37 ?C)  ?TempSrc: Oral Oral Oral Oral  ?SpO2: 94% 94% 95% 94%  ?Weight:      ?Height:      ? ? ?Last BM Date : 06/04/21 ? ?Intake/Output  ? ?Yesterday: ? 03/30 0701 - 03/31 0700 ?In: 1783.5 [P.O.:780; IV Piggyback:1003.5] ?Out: 3400 [Urine:2775; Stool:625] ?This shift: ? Total I/O ?In: 61 [P.O.:60] ?Out: -  ? ?Bowel  function: ? Flatus: YES ? BM:  YES -mildlt thickened dark brown succus effluent ? Drain: Thinly serosanguineous.   ? ?Physical Exam: ? ?General: Pt alert in no acute distress.  Looks a little tired and spacey whi

## 2021-06-05 NOTE — Progress Notes (Signed)
Patient report was called to [facility] and given to [place] [phone]. All questions were answered. Patient being transported via [place]. ?

## 2021-06-06 ENCOUNTER — Other Ambulatory Visit: Payer: Self-pay | Admitting: Adult Health

## 2021-06-06 MED ORDER — ALPRAZOLAM 1 MG PO TABS: 1.0000 mg | ORAL_TABLET | Freq: Two times a day (BID) | ORAL | 0 refills | Status: DC

## 2021-06-06 MED ORDER — TRAMADOL HCL 50 MG PO TABS: 50.0000 mg | ORAL_TABLET | Freq: Four times a day (QID) | ORAL | 0 refills | Status: DC | PRN

## 2021-06-08 ENCOUNTER — Telehealth: Payer: Self-pay | Admitting: *Deleted

## 2021-06-08 ENCOUNTER — Non-Acute Institutional Stay (SKILLED_NURSING_FACILITY): Payer: Medicare Other | Admitting: Adult Health

## 2021-06-08 ENCOUNTER — Encounter: Payer: Self-pay | Admitting: Adult Health

## 2021-06-08 DIAGNOSIS — K219 Gastro-esophageal reflux disease without esophagitis: Secondary | ICD-10-CM

## 2021-06-08 DIAGNOSIS — E1122 Type 2 diabetes mellitus with diabetic chronic kidney disease: Secondary | ICD-10-CM

## 2021-06-08 DIAGNOSIS — R198 Other specified symptoms and signs involving the digestive system and abdomen: Secondary | ICD-10-CM

## 2021-06-08 DIAGNOSIS — E1169 Type 2 diabetes mellitus with other specified complication: Secondary | ICD-10-CM

## 2021-06-08 DIAGNOSIS — I25110 Atherosclerotic heart disease of native coronary artery with unstable angina pectoris: Secondary | ICD-10-CM | POA: Diagnosis not present

## 2021-06-08 DIAGNOSIS — F319 Bipolar disorder, unspecified: Secondary | ICD-10-CM

## 2021-06-08 DIAGNOSIS — E118 Type 2 diabetes mellitus with unspecified complications: Secondary | ICD-10-CM

## 2021-06-08 DIAGNOSIS — R338 Other retention of urine: Secondary | ICD-10-CM

## 2021-06-08 DIAGNOSIS — N401 Enlarged prostate with lower urinary tract symptoms: Secondary | ICD-10-CM

## 2021-06-08 DIAGNOSIS — N183 Chronic kidney disease, stage 3 unspecified: Secondary | ICD-10-CM

## 2021-06-08 DIAGNOSIS — F333 Major depressive disorder, recurrent, severe with psychotic symptoms: Secondary | ICD-10-CM

## 2021-06-08 DIAGNOSIS — N1831 Chronic kidney disease, stage 3a: Secondary | ICD-10-CM

## 2021-06-08 DIAGNOSIS — I700 Atherosclerosis of aorta: Secondary | ICD-10-CM

## 2021-06-08 DIAGNOSIS — I422 Other hypertrophic cardiomyopathy: Secondary | ICD-10-CM | POA: Diagnosis not present

## 2021-06-08 DIAGNOSIS — E785 Hyperlipidemia, unspecified: Secondary | ICD-10-CM

## 2021-06-08 DIAGNOSIS — C20 Malignant neoplasm of rectum: Secondary | ICD-10-CM

## 2021-06-08 DIAGNOSIS — Z932 Ileostomy status: Secondary | ICD-10-CM

## 2021-06-08 DIAGNOSIS — I129 Hypertensive chronic kidney disease with stage 1 through stage 4 chronic kidney disease, or unspecified chronic kidney disease: Secondary | ICD-10-CM

## 2021-06-08 NOTE — Progress Notes (Signed)
?Location:  [place] ?Nursing Home Room Number: 132-P ?Place of Service:  SNF (31) ? ? ?CODE STATUS: Full Code ? ?Allergies  ?Allergen Reactions  ? Nsaids Other (See Comments)  ?  Chronic kidney disease - avoid NSAIDs  ? ? ?Chief Complaint  ?Patient presents with  ? Hospitalization Follow-up  ? ? ?HPI: ? ?He is a 66 year old man who has been hospitalized from 05-27-21 through 06-05-21. His medical history includes diabetes; hypertension; hyperlipidemia. He has had been diagnosed with rectal cancer. He underwent a robotic low anterior rectosigmoid resection with diverting loop ileostomy on the day of his admission. He has a high out put ileostomy with the consistency of oatmeal at this time. He will need one liter of fluids daily to help maintain his renal function. He is here for short term rehab with his goal to return back home. He is presently denying any pain. He will continue to be followed for his chronic illnesses including:  Type 2 diabetes mellitus with complication without long term use of insulin: Hypertension associated with stage 3a chronic kidney disease due to type 2 diabetes mellitus: Hyperlipidemia associated with type 2 diabetes mellitus: . CKD stage 3 due to type 2 diabetes mellitus ? ? ? ?Past Medical History:  ?Diagnosis Date  ? Back pain   ? Bipolar disorder ([place])   ? bipolar 1  ? CKD (chronic kidney disease) stage 2, GFR 60-89 ml/min 09/24/2020  ? Hypertension   ? Knee pain   ? Migraine   ? Rectal cancer ([place])   ? Type 2 diabetes mellitus with complication, without long-term current use of insulin ([place]) 03/19/2021  ? ? ?Past Surgical History:  ?Procedure Laterality Date  ? BACK SURGERY    ? JOINT REPLACEMENT Right   ? knee  ? OSTOMY N/A 05/27/2021  ? Procedure: POSSIBLE OSTOMY;  Surgeon: Michael Boston, MD;  Location: [facility];  Service: General;  Laterality: N/A;  ? PORTACATH PLACEMENT N/A 10/02/2020  ? Procedure: PORT-A-CATH PLACEMENT;  Surgeon: Dwan Bolt, MD;  Location: [facility];   Service: General;  Laterality: N/A;  ? PROCTOSCOPY N/A 05/27/2021  ? Procedure: RIGID PROCTOSCOPY;  Surgeon: Michael Boston, MD;  Location: [facility];  Service: General;  Laterality: N/A;  ? XI ROBOTIC ASSISTED LOWER ANTERIOR RESECTION N/A 05/27/2021  ? Procedure: ROBOTIC LOW ANTERIOR COLON RESECTION, VERY LOW COLOANAL ANASTOMOSIS, DIVERTING ILEOSTOMY, BILATERAL TAP BLOCK,  FIRFLY INJECTION;  Surgeon: Michael Boston, MD;  Location: [facility];  Service: General;  Laterality: N/A;  ? ? ?Social History  ? ?Socioeconomic History  ? Marital status: Divorced  ?  Spouse name: Not on file  ? Number of children: Not on file  ? Years of education: Not on file  ? Highest education level: Not on file  ?Occupational History  ? Not on file  ?Tobacco Use  ? Smoking status: Former  ?  Packs/day: 0.50  ?  Years: 45.00  ?  Pack years: 22.50  ?  Types: Cigarettes  ?  Quit date: 04/22/2021  ?  Years since quitting: 0.1  ? Smokeless tobacco: Never  ?Vaping Use  ? Vaping Use: Never used  ?Substance and Sexual Activity  ? Alcohol use: No  ? Drug use: No  ? Sexual activity: Not on file  ?Other Topics Concern  ? Not on file  ?Social History Narrative  ? Not on file  ? ?Social Determinants of Health  ? ?Financial Resource Strain: Not on file  ?Food Insecurity: Not on  file  ?Transportation Needs: Not on file  ?Physical Activity: Not on file  ?Stress: Not on file  ?Social Connections: Not on file  ?Intimate Partner Violence: Not on file  ? ?Family History  ?Problem Relation Age of Onset  ? ALS Mother   ? Heart disease Mother   ? Parkinson's disease Mother   ? Heart failure Father   ? ? ? ? ?VITAL SIGNS ?BP 127/73   Pulse 90   Temp 98 ?F (36.7 ?C)   Resp 20   Ht 6' (1.829 m)   Wt 227 lb 3.2 oz (103.1 kg)   SpO2 94%   BMI 30.81 kg/m?  ? ?Outpatient Encounter Medications as of 06/08/2021  ?Medication Sig  ? acetaminophen (TYLENOL) 325 MG tablet Take 650 mg every 4 (four) hours as needed by mouth for mild pain.  ? albuterol (VENTOLIN HFA) 108 (90 Base)  MCG/ACT inhaler Inhale 2 puffs into the lungs every 6 (six) hours as needed for wheezing or shortness of breath.  ? ALPRAZolam (XANAX) 1 MG tablet Take 1 tablet (1 mg total) by mouth 2 (two) times daily.  ? aspirin EC 81 MG tablet Take 81 mg by mouth daily. Swallow whole.  ? Cholecalciferol (VITAMIN D3) 50 MCG (2000 UT) capsule Take 2,000 Units by mouth daily.  ? Heparin Sodium, Porcine, (HEPARIN LOCK FLUSH IJ) syringe; 10 unit/mL; amt: 10 units/mL; intravenous;Once A Day ?Special Instructions: Flush port line after each use.  ? lidocaine-prilocaine (EMLA) cream Apply 1 application topically as directed. Apply 1/2 tablespoon to port-a-cath site 2 hours prior to stick and cover with plastic wrap to numb site  ? loperamide (IMODIUM) 2 MG capsule Take 2 mg by mouth as needed for diarrhea or loose stools.  ? loperamide (IMODIUM) 2 MG capsule Take 1 capsule (2 mg total) by mouth 2 (two) times daily.  ? LOTENSIN HCT 20-12.5 MG per tablet TAKE (2) TABLETS DAILY  ? magnesium oxide (MAG-OX) 400 (240 Mg) MG tablet TAKE ONE TABLET TWICE DAILY  ? metFORMIN (GLUCOPHAGE) 500 MG tablet Take 1 tablet (500 mg total) by mouth 2 (two) times daily with a meal.  ? mirtazapine (REMERON) 45 MG tablet Take 45 mg by mouth at bedtime.  ? Needle, Disp, (HUBER NEEDLE 22GX3/4") 22G X 3/4" MISC 22 x 3/4 "; miscellaneous,Once A Day on Mon ?Special Instructions: change weekly  ? NON FORMULARY Diet: Regular  ? omeprazole (PRILOSEC) 20 MG capsule Take 1 capsule (20 mg total) 2 (two) times daily by mouth.  ? ondansetron (ZOFRAN) 8 MG tablet Take 1 tablet (8 mg total) by mouth every 8 (eight) hours as needed for nausea or vomiting.  ? polycarbophil (FIBERCON) 625 MG tablet Take 1 tablet (625 mg total) by mouth 2 (two) times daily.  ? potassium chloride SA (KLOR-CON M) 20 MEQ tablet TAKE ONE TABLET ONCE DAILY  ? pravastatin (PRAVACHOL) 40 MG tablet Take 40 mg by mouth daily.  ? propranolol ER (INDERAL LA) 80 MG 24 hr capsule Take 2 capsules (160 mg  total) by mouth daily.  ? risperiDONE (RISPERDAL) 0.25 MG tablet Take 0.25 mg by mouth at bedtime.  ? sodium chloride 0.9 % injection Special Instructions: Flush port-a-cath with N/S 56m before administering IV fluids. ?Once A Day  ? SUMAtriptan (IMITREX) 100 MG tablet Take 100 mg by mouth 2 (two) times daily as needed for migraine.  ? tamsulosin (FLOMAX) 0.4 MG CAPS capsule Take 1 capsule (0.4 mg total) by mouth daily after breakfast.  ? traMADol (ULTRAM)  50 MG tablet Take 1-2 tablets (50-100 mg total) by mouth every 6 (six) hours as needed for moderate pain.  ? [DISCONTINUED] sodium chloride 0.9 % SOLN by CRRT route as needed.  ? prochlorperazine (COMPAZINE) 10 MG tablet Take 1 tablet (10 mg total) by mouth every 6 (six) hours as needed for nausea. (Patient not taking: Reported on 05/12/2021)  ? ?No facility-administered encounter medications on file as of 06/08/2021.  ? ? ? ?SIGNIFICANT DIAGNOSTIC EXAMS ? ?TODAY ? ?04-01-21: 2-d echo:  ?1. There is severe asymmetric hypertrophy of the apical segments (up to  18 mm) consistent with apical variant hypertrophic cardiomyopathy. There  is concern for an apical aneurysm on this study, however this was a  difficult study. Would recommend cardiac  MRI for better characterization. Left ventricular ejection fraction, by  estimation, is 55 to 60%. The left ventricle has normal function. The left ventricle has no regional wall motion abnormalities. There is severe  asymmetric left ventricular  hypertrophy of the apical segment. Left ventricular diastolic parameters  are consistent with Grade I diastolic dysfunction (impaired relaxation).  ? ?05-29-21: renal ultrasound ?Redemonstration of right renal cyst without evidence of superimposed hydronephrosis. Greatest dimension estimated 8.2 cm. ?Left renal cortex not well evaluated and is atrophic/replaced by chronic cystic change/hydronephrosis in the setting of chronic UPJ obstruction described on prior CT. ? ?LABS REVIEWED;   ? ?05-14-21: hgb a1c 7.2 ?05-28-21: wbc 9.7; hgb 13.5; hct 38.8; mcv 97.5 plt 124; glucose 193; bun 28; creat 1.74; k+ 4.4; na++ 134; ca 7.8; GFR 43; protein 5.8; albumin 3.1; mag 1.5 ?06-01-21: glucose 134; bun 28; creat

## 2021-06-08 NOTE — Telephone Encounter (Signed)
Call to [employer] at [facility]. She reports he is doing well. Provided her with appointment information for 06/27/21 with Dr. Benay Spice and location of practice. Facility will transport. ?Transportation to appointments were confirmed for the patient as being  Facility . ? ?

## 2021-06-09 ENCOUNTER — Non-Acute Institutional Stay (SKILLED_NURSING_FACILITY): Payer: Medicare Other | Admitting: Internal Medicine

## 2021-06-09 ENCOUNTER — Encounter: Payer: Self-pay | Admitting: Internal Medicine

## 2021-06-09 ENCOUNTER — Other Ambulatory Visit (HOSPITAL_COMMUNITY)
Admission: RE | Admit: 2021-06-09 | Discharge: 2021-06-09 | Disposition: A | Discharge status: Home / Self Care | Payer: Medicare Other | Source: Skilled Nursing Facility | Attending: Adult Health | Admitting: Adult Health

## 2021-06-09 DIAGNOSIS — D62 Acute posthemorrhagic anemia: Secondary | ICD-10-CM

## 2021-06-09 DIAGNOSIS — N179 Acute kidney failure, unspecified: Secondary | ICD-10-CM

## 2021-06-09 DIAGNOSIS — C20 Malignant neoplasm of rectum: Secondary | ICD-10-CM | POA: Diagnosis not present

## 2021-06-09 DIAGNOSIS — Z28310 Unvaccinated for covid-19: Secondary | ICD-10-CM | POA: Insufficient documentation

## 2021-06-09 DIAGNOSIS — E118 Type 2 diabetes mellitus with unspecified complications: Secondary | ICD-10-CM | POA: Diagnosis not present

## 2021-06-09 LAB — CBC WITH DIFFERENTIAL/PLATELET
Abs Immature Granulocytes: 0.18 10*3/uL — ABNORMAL HIGH (ref 0.00–0.07)
Basophils Absolute: 0 10*3/uL (ref 0.0–0.1)
Basophils Relative: 0 %
Eosinophils Absolute: 0.1 10*3/uL (ref 0.0–0.5)
Eosinophils Relative: 1 %
HCT: 26.8 % — ABNORMAL LOW (ref 39.0–52.0)
Hemoglobin: 8.5 g/dL — ABNORMAL LOW (ref 13.0–17.0)
Immature Granulocytes: 2 %
Lymphocytes Relative: 10 %
Lymphs Abs: 0.8 10*3/uL (ref 0.7–4.0)
MCH: 32 pg (ref 26.0–34.0)
MCHC: 31.7 g/dL (ref 30.0–36.0)
MCV: 100.8 fL — ABNORMAL HIGH (ref 80.0–100.0)
Monocytes Absolute: 1 10*3/uL (ref 0.1–1.0)
Monocytes Relative: 13 %
Neutro Abs: 5.4 10*3/uL (ref 1.7–7.7)
Neutrophils Relative %: 74 %
Platelets: 399 10*3/uL (ref 150–400)
RBC: 2.66 MIL/uL — ABNORMAL LOW (ref 4.22–5.81)
RDW: 14.4 % (ref 11.5–15.5)
WBC: 7.4 10*3/uL (ref 4.0–10.5)
nRBC: 0 % (ref 0.0–0.2)

## 2021-06-09 LAB — BASIC METABOLIC PANEL
Anion gap: 8 (ref 5–15)
BUN: 19 mg/dL (ref 8–23)
CO2: 29 mmol/L (ref 22–32)
Calcium: 8.3 mg/dL — ABNORMAL LOW (ref 8.9–10.3)
Chloride: 98 mmol/L (ref 98–111)
Creatinine, Ser: 1.43 mg/dL — ABNORMAL HIGH (ref 0.61–1.24)
GFR, Estimated: 54 mL/min — ABNORMAL LOW (ref >60)
Glucose, Bld: 145 mg/dL — ABNORMAL HIGH (ref 70–99)
Potassium: 3.8 mmol/L (ref 3.5–5.1)
Sodium: 135 mmol/L (ref 135–145)

## 2021-06-09 LAB — D-DIMER, QUANTITATIVE: D-Dimer, Quant: 7.64 ug/mL-FEU — ABNORMAL HIGH (ref 0.00–0.50)

## 2021-06-09 NOTE — Assessment & Plan Note (Signed)
Monitor CBC at SNF. ?

## 2021-06-09 NOTE — Assessment & Plan Note (Signed)
Peak creatinine 3.11 with a GFR 21 indicating stage IV renal disease.  Nephrology consulted.  Obstructive etiology clinically present with good response to Foley placement.  Creatinine 1.27 and GFR greater than 60 at discharge. ?

## 2021-06-09 NOTE — Patient Instructions (Signed)
See assessment and plan under each diagnosis in the problem list and acutely for this visit 

## 2021-06-09 NOTE — Progress Notes (Signed)
? ?NURSING HOME LOCATION:  [place] ?ROOM NUMBER:   ? ?CODE STATUS:  Full Code ? ?PCP:  Aura Dials MD ? ?This is a comprehensive admission note to this SNFperformed on this date less than 30 days from date of admission. ?Included are preadmission medical/surgical history; reconciled medication list; family history; social history and comprehensive review of systems.  ?Corrections and additions to the records were documented. Comprehensive physical exam was also performed. Additionally a clinical summary was entered for each active diagnosis pertinent to this admission in the Problem List to enhance continuity of care. ? ?HPI: He was hospitalized 3/22 - 06/05/2021 for surgical treatment of rectal cancer. ?On 3/22 robotic assisted lower anterior rectosigmoid resection with very low coloanal anastomosis was performed along with diverting loop ileostomy by Dr. Alwyn Pea.  Rigid proctoscopy revealed that the anastomosis was located 2 cm from the anal verge.  No residual carcinoma or lymph node involvement was identified.  Pathology suggested a complete pathologic response; GI tumor board discussed the case and felt there was no need for additional adjuvant chemotherapy.  Survival pathway was to be pursued through the [facility]. ?These procedures were performed 10 weeks after he had recovered from neoadjuvant chemoradiation therapy. ?On 3/23 H/H was 13.5/38.8.  As expected with extensive surgery; there was a postoperative drop in H/H with values of 8.1/24.4 at discharge. ?Postoperatively he was gradually mobilized and advanced to a solid diet.  Sliding scale insulin was initiated for hyperglycemia.  Glucoses ranged from a low of 114 up to a high of 299.  Current A1c was 7.3% indicating adequate diabetic control.  ?Postop he did have high output ileostomy syndrome which  was controlled with fiber/iron/Imodium twice daily.  It was recommended that he receive 1 L of IV fluids through the  Port-A-Cath daily x6 weeks to minimize dehydration and renal failure. ?He did have some AKI superimposed on chronic kidney disease.  Metformin was adjusted to 500 mg twice daily.Creatinine peaked at 3.11 with a GFR of 21 indicating stage IV renal disease.   ?Nephrology felt that this was obstructive in nature and placed a Foley with large return.  Creatinine did trend down toward baseline. Foley removal was attempted 3 days later but he had difficulty urinating.  Dr. Gloriann Loan, Urology recommended tamsulosin and with follow-up several weeks as an outpatient to reassess postvoid residual. ?At discharge creatinine was 1.27 with a GFR greater than 60 indicating stage II CKD. PT/OT recommended SNF placement for rehab.  Such supportive care was felt to be clinically indicated because of the ileostomy management issues. ? ?Past medical and surgical history: Includes history of diabetes with CKD stage II, history of polycystic kidneys, essential hypertension, history of migraine, and bipolar disorder. ?Surgeries and procedures include TKA, back surgery, and Port-A-Cath placement. ? ?Social history: Nondrinker; he has over a 20-pack-year history of smoking. ? ?Family history: Reviewed ?  ?Review of systems: He is alert and oriented.  He understands the complexities of his hospitalization.  He made the comment that there was a "misstep and I had to have a Foley".  He describes some numbness and tingling in his toes.  He denies any significant abdominal pain or GI symptoms at this time. ? ?Constitutional: No fever, significant weight change, fatigue  ?Eyes: No redness, discharge, pain, vision change ?ENT/mouth: No nasal congestion, purulent discharge, earache, change in hearing, sore throat  ?Cardiovascular: No chest pain, palpitations, paroxysmal nocturnal dyspnea, claudication, edema  ?Respiratory: No cough, sputum production, hemoptysis, DOE,  significant snoring, apnea Gastrointestinal: No heartburn, dysphagia, abdominal pain,  nausea /vomiting, rectal bleeding, melena, change in bowels ?Genitourinary: No dysuria, hematuria, pyuria, incontinence, nocturia ?Musculoskeletal: No joint stiffness, joint swelling, weakness, pain ?Dermatologic: No rash, pruritus, change in appearance of skin ?Neurologic: No dizziness, headache, syncope, seizures ?Psychiatric: No significant anxiety, depression, insomnia, anorexia ?Endocrine: No change in hair/skin/nails, excessive thirst, excessive hunger, excessive urination  ?Hematologic/lymphatic: No significant bruising, lymphadenopathy, abnormal bleeding ?Allergy/immunology: No itchy/watery eyes, significant sneezing, urticaria, angioedema ? ?Physical exam:  ?Pertinent or positive findings: He has a full head of hair.  He has beard and mustache.  He is edentulous.  Heart sounds are distant.  Bowel sounds are active.  There is slight fullness in the left abdomen to palpation.  Ostomy bag and Foley with leg bag present.  He has trace edema at the left sock line and 1/2+ at the right sock line.  Pedal pulses are good. ? ?General appearance: Adequately nourished; no acute distress, increased work of breathing is present.   ?Lymphatic: No lymphadenopathy about the head, neck, axilla. ?Eyes: No conjunctival inflammation or lid edema is present. There is no scleral icterus. ?Ears:  External ear exam shows no significant lesions or deformities.   ?Nose:  External nasal examination shows no deformity or inflammation. Nasal mucosa are pink and moist without lesions, exudates ?Oral exam: Lips and gums are healthy appearing.There is no oropharyngeal erythema or exudate. ?Neck:  No thyromegaly, masses, tenderness noted.    ?Heart:  Normal rate and regular rhythm. S1 and S2 normal without gallop, murmur, click, rub.  ?Lungs: Chest clear to auscultation without wheezes, rhonchi, rales, rubs. ?Abdomen: Abdomen is soft and nontender with no organomegaly, hernias, masses. ?GU: Deferred  ?Extremities:  No cyanosis,  clubbing. ?Neurologic exam: Balance, Rhomberg, finger to nose testing could not be completed due to clinical state ?Skin: Warm & dry w/o tenting. ?No significant lesions or rash. ? ?See clinical summary under each active problem in the Problem List with associated updated therapeutic plan ? ?

## 2021-06-09 NOTE — Assessment & Plan Note (Signed)
05/27/2021 robotic assisted lower anterior rectosigmoid resection with very low coloanal anastomosis with diverting loop ileostomy.  Preop H/H 13.5/38.8.  H/H predischarge 8.1/24.4.  CBC will be monitored at the SNF.  No bleeding dyscrasias reported at this time. ?

## 2021-06-09 NOTE — Assessment & Plan Note (Addendum)
Glucoses while hospitalized ranged from a low of 114 up to a high of 299.  Current A1c 7.3% indicating adequate control.  Metformin adjusted in the context of AKI. ?

## 2021-06-10 ENCOUNTER — Non-Acute Institutional Stay (SKILLED_NURSING_FACILITY): Payer: Medicare Other | Admitting: Adult Health

## 2021-06-10 ENCOUNTER — Encounter: Payer: Self-pay | Admitting: Adult Health

## 2021-06-10 DIAGNOSIS — R7982 Elevated C-reactive protein (CRP): Secondary | ICD-10-CM | POA: Diagnosis not present

## 2021-06-10 DIAGNOSIS — U071 COVID-19: Secondary | ICD-10-CM

## 2021-06-10 DIAGNOSIS — E119 Type 2 diabetes mellitus without complications: Secondary | ICD-10-CM | POA: Diagnosis not present

## 2021-06-10 LAB — C-REACTIVE PROTEIN: CRP: 10.7 mg/dL — ABNORMAL HIGH (ref <1.0)

## 2021-06-10 NOTE — Progress Notes (Signed)
?Location:  [place] ?Nursing Home Room Number: 160-P ?Place of Service:  SNF (31) ? ? ?CODE STATUS: Full Code ? ?Allergies  ?Allergen Reactions  ? Nsaids Other (See Comments)  ?  Chronic kidney disease - avoid NSAIDs  ? ? ?Chief Complaint  ?Patient presents with  ? Acute Visit  ?  COVID positive   ? ? ?HPI: ? ?He has tested positive for covid. There are no reports of cough or shortness of breath. There are no reports of fever present. His CRP is elevated at 10.7.  ? ?Past Medical History:  ?Diagnosis Date  ? Back pain   ? Bipolar disorder ([place])   ? bipolar 1  ? CKD (chronic kidney disease) stage 2, GFR 60-89 ml/min 09/24/2020  ? Hypertension   ? Knee pain   ? Migraine   ? Rectal cancer ([place])   ? Type 2 diabetes mellitus with complication, without long-term current use of insulin ([place]) 03/19/2021  ? ? ?Past Surgical History:  ?Procedure Laterality Date  ? BACK SURGERY    ? JOINT REPLACEMENT Right   ? knee  ? OSTOMY N/A 05/27/2021  ? Procedure: POSSIBLE OSTOMY;  Surgeon: Michael Boston, MD;  Location: [facility];  Service: General;  Laterality: N/A;  ? PORTACATH PLACEMENT N/A 10/02/2020  ? Procedure: PORT-A-CATH PLACEMENT;  Surgeon: Dwan Bolt, MD;  Location: [facility];  Service: General;  Laterality: N/A;  ? PROCTOSCOPY N/A 05/27/2021  ? Procedure: RIGID PROCTOSCOPY;  Surgeon: Michael Boston, MD;  Location: [facility];  Service: General;  Laterality: N/A;  ? XI ROBOTIC ASSISTED LOWER ANTERIOR RESECTION N/A 05/27/2021  ? Procedure: ROBOTIC LOW ANTERIOR COLON RESECTION, VERY LOW COLOANAL ANASTOMOSIS, DIVERTING ILEOSTOMY, BILATERAL TAP BLOCK,  FIRFLY INJECTION;  Surgeon: Michael Boston, MD;  Location: [facility];  Service: General;  Laterality: N/A;  ? ? ?Social History  ? ?Socioeconomic History  ? Marital status: Divorced  ?  Spouse name: Not on file  ? Number of children: Not on file  ? Years of education: Not on file  ? Highest education level: Not on file  ?Occupational History  ? Not on file  ?Tobacco Use  ? Smoking  status: Former  ?  Packs/day: 0.50  ?  Years: 45.00  ?  Pack years: 22.50  ?  Types: Cigarettes  ?  Quit date: 04/22/2021  ?  Years since quitting: 0.1  ? Smokeless tobacco: Never  ?Vaping Use  ? Vaping Use: Never used  ?Substance and Sexual Activity  ? Alcohol use: No  ? Drug use: No  ? Sexual activity: Not on file  ?Other Topics Concern  ? Not on file  ?Social History Narrative  ? Not on file  ? ?Social Determinants of Health  ? ?Financial Resource Strain: Not on file  ?Food Insecurity: Not on file  ?Transportation Needs: Not on file  ?Physical Activity: Not on file  ?Stress: Not on file  ?Social Connections: Not on file  ?Intimate Partner Violence: Not on file  ? ?Family History  ?Problem Relation Age of Onset  ? ALS Mother   ? Heart disease Mother   ? Parkinson's disease Mother   ? Heart failure Father   ? ? ? ? ?VITAL SIGNS ?BP 125/60   Pulse 88   Temp (!) 97.4 ?F (36.3 ?C)   Resp 20   Ht 6' (1.829 m)   Wt 221 lb 9.6 oz (100.5 kg)   SpO2 97%   BMI 30.05 kg/m?  ? ?Outpatient Encounter Medications  as of 06/10/2021  ?Medication Sig  ? acetaminophen (TYLENOL) 325 MG tablet Take 650 mg every 4 (four) hours as needed by mouth for mild pain.  ? albuterol (VENTOLIN HFA) 108 (90 Base) MCG/ACT inhaler Inhale 2 puffs into the lungs every 6 (six) hours as needed for wheezing or shortness of breath.  ? ALPRAZolam (XANAX) 1 MG tablet Take 1 tablet (1 mg total) by mouth 2 (two) times daily.  ? aspirin EC 81 MG tablet Take 81 mg by mouth daily. Swallow whole.  ? Cholecalciferol (VITAMIN D3) 50 MCG (2000 UT) capsule Take 2,000 Units by mouth daily.  ? Ergocalciferol (VITAMIN D2 PO) Take 1,250 mcg by mouth once a week.  ? Heparin Sodium, Porcine, (HEPARIN LOCK FLUSH IJ) syringe; 10 unit/mL; amt: 10 units/mL; intravenous;Once A Day ?Special Instructions: Flush port line after each use.  ? lidocaine-prilocaine (EMLA) cream Apply 1 application topically as directed. Apply 1/2 tablespoon to port-a-cath site 2 hours prior to  stick and cover with plastic wrap to numb site  ? loperamide (IMODIUM) 2 MG capsule Take 2 mg by mouth every 8 (eight) hours as needed for diarrhea or loose stools.  ? loperamide (IMODIUM) 2 MG capsule Take 1 capsule (2 mg total) by mouth 2 (two) times daily.  ? LOTENSIN HCT 20-12.5 MG per tablet TAKE (2) TABLETS DAILY  ? magnesium oxide (MAG-OX) 400 (240 Mg) MG tablet TAKE ONE TABLET TWICE DAILY  ? metFORMIN (GLUCOPHAGE) 500 MG tablet Take 1 tablet (500 mg total) by mouth 2 (two) times daily with a meal.  ? mirtazapine (REMERON) 45 MG tablet Take 45 mg by mouth at bedtime.  ? molnupiravir EUA (LAGEVRIO) 200 MG CAPS capsule Take 4 capsules by mouth 2 (two) times daily.  ? Needle, Disp, (HUBER NEEDLE 22GX3/4") 22G X 3/4" MISC 22 x 3/4 "; miscellaneous,Once A Day on Mon ?Special Instructions: change weekly  ? NON FORMULARY Diet: Regular  ? Nutritional Supplements (ENSURE ENLIVE PO) Take by mouth. 120 ml with medpass to protect muscle mass and help meet increased proten/energy needs ?Twice A Day Between Meals  ? omeprazole (PRILOSEC) 20 MG capsule Take 1 capsule (20 mg total) 2 (two) times daily by mouth.  ? ondansetron (ZOFRAN) 8 MG tablet Take 1 tablet (8 mg total) by mouth every 8 (eight) hours as needed for nausea or vomiting.  ? polycarbophil (FIBERCON) 625 MG tablet Take 1 tablet (625 mg total) by mouth 2 (two) times daily.  ? potassium chloride SA (KLOR-CON M) 20 MEQ tablet TAKE ONE TABLET ONCE DAILY  ? pravastatin (PRAVACHOL) 40 MG tablet Take 40 mg by mouth daily.  ? predniSONE (DELTASONE) 20 MG tablet Take 20 mg by mouth in the morning and at bedtime. for CRP of 10.7  ? propranolol ER (INDERAL LA) 80 MG 24 hr capsule Take 2 capsules (160 mg total) by mouth daily.  ? risperiDONE (RISPERDAL) 0.25 MG tablet Take 0.25 mg by mouth at bedtime.  ? sodium chloride 0.9 % injection Special Instructions: Flush port-a-cath with N/S 38m before administering IV fluids. ?Once A Day  ? SUMAtriptan (IMITREX) 100 MG tablet  Take 100 mg by mouth 2 (two) times daily as needed for migraine.  ? tamsulosin (FLOMAX) 0.4 MG CAPS capsule Take 1 capsule (0.4 mg total) by mouth daily after breakfast.  ? Throat Lozenges (ZINC W/A&C) LOZG Use as directed in the mouth or throat. 5 Times Per Day x 2 weeks.  ? traMADol (ULTRAM) 50 MG tablet Take 1-2 tablets (50-100 mg  total) by mouth every 6 (six) hours as needed for moderate pain.  ? vitamin C (ASCORBIC ACID) 500 MG tablet Take 500 mg by mouth 2 (two) times daily.  ? prochlorperazine (COMPAZINE) 10 MG tablet Take 1 tablet (10 mg total) by mouth every 6 (six) hours as needed for nausea. (Patient not taking: Reported on 05/12/2021)  ? ?No facility-administered encounter medications on file as of 06/10/2021.  ? ? ? ?SIGNIFICANT DIAGNOSTIC EXAMS ? ?TODAY ? ?04-01-21: 2-d echo:  ?1. There is severe asymmetric hypertrophy of the apical segments (up to  18 mm) consistent with apical variant hypertrophic cardiomyopathy. There  is concern for an apical aneurysm on this study, however this was a  difficult study. Would recommend cardiac  MRI for better characterization. Left ventricular ejection fraction, by  estimation, is 55 to 60%. The left ventricle has normal function. The left ventricle has no regional wall motion abnormalities. There is severe  asymmetric left ventricular  hypertrophy of the apical segment. Left ventricular diastolic parameters  are consistent with Grade I diastolic dysfunction (impaired relaxation).  ? ?05-29-21: renal ultrasound ?Redemonstration of right renal cyst without evidence of superimposed hydronephrosis. Greatest dimension estimated 8.2 cm. ?Left renal cortex not well evaluated and is atrophic/replaced by chronic cystic change/hydronephrosis in the setting of chronic UPJ obstruction described on prior CT. ? ?LABS REVIEWED;  ? ?05-14-21: hgb a1c 7.2 ?05-28-21: wbc 9.7; hgb 13.5; hct 38.8; mcv 97.5 plt 124; glucose 193; bun 28; creat 1.74; k+ 4.4; na++ 134; ca 7.8; GFR 43; protein 5.8;  albumin 3.1; mag 1.5 ?06-01-21: glucose 134; bun 28; creat 1.69; k+ 3.7; na++ 136; ca 7.7; GFR 44 ? ?TODAY ? ?06-09-21: wbc 7.4; hgb 8.5; hct 26.8; mcv 100.8 plt 399; glucose 145; bun 19; creat 1.43; k+ 3.8; na++ 135;

## 2021-06-11 ENCOUNTER — Other Ambulatory Visit: Payer: Self-pay | Admitting: Adult Health

## 2021-06-11 DIAGNOSIS — E785 Hyperlipidemia, unspecified: Secondary | ICD-10-CM | POA: Insufficient documentation

## 2021-06-11 DIAGNOSIS — E1169 Type 2 diabetes mellitus with other specified complication: Secondary | ICD-10-CM | POA: Insufficient documentation

## 2021-06-11 DIAGNOSIS — I700 Atherosclerosis of aorta: Secondary | ICD-10-CM | POA: Insufficient documentation

## 2021-06-11 DIAGNOSIS — I2510 Atherosclerotic heart disease of native coronary artery without angina pectoris: Secondary | ICD-10-CM | POA: Insufficient documentation

## 2021-06-11 DIAGNOSIS — I129 Hypertensive chronic kidney disease with stage 1 through stage 4 chronic kidney disease, or unspecified chronic kidney disease: Secondary | ICD-10-CM | POA: Insufficient documentation

## 2021-06-11 DIAGNOSIS — N183 Chronic kidney disease, stage 3 unspecified: Secondary | ICD-10-CM | POA: Insufficient documentation

## 2021-06-11 DIAGNOSIS — I422 Other hypertrophic cardiomyopathy: Secondary | ICD-10-CM | POA: Insufficient documentation

## 2021-06-11 DIAGNOSIS — E1122 Type 2 diabetes mellitus with diabetic chronic kidney disease: Secondary | ICD-10-CM | POA: Insufficient documentation

## 2021-06-11 MED ORDER — ALPRAZOLAM 1 MG PO TABS: 1.0000 mg | ORAL_TABLET | Freq: Two times a day (BID) | ORAL | 0 refills | Status: DC

## 2021-06-15 ENCOUNTER — Other Ambulatory Visit (HOSPITAL_COMMUNITY)
Admission: RE | Admit: 2021-06-15 | Discharge: 2021-06-15 | Disposition: A | Discharge status: Home / Self Care | Payer: Medicare Other | Source: Skilled Nursing Facility | Attending: Adult Health | Admitting: Adult Health

## 2021-06-15 DIAGNOSIS — C20 Malignant neoplasm of rectum: Secondary | ICD-10-CM | POA: Insufficient documentation

## 2021-06-15 LAB — MAGNESIUM: Magnesium: 2 mg/dL (ref 1.7–2.4)

## 2021-06-16 ENCOUNTER — Inpatient Hospital Stay: Payer: Medicare Other

## 2021-06-16 ENCOUNTER — Ambulatory Visit (HOSPITAL_COMMUNITY): Payer: Medicare Other

## 2021-06-16 ENCOUNTER — Inpatient Hospital Stay: Payer: Medicare Other | Admitting: Oncology

## 2021-06-17 DIAGNOSIS — E119 Type 2 diabetes mellitus without complications: Secondary | ICD-10-CM | POA: Insufficient documentation

## 2021-06-17 DIAGNOSIS — R7982 Elevated C-reactive protein (CRP): Secondary | ICD-10-CM | POA: Insufficient documentation

## 2021-06-18 ENCOUNTER — Encounter (HOSPITAL_COMMUNITY)
Admission: RE | Admit: 2021-06-18 | Discharge: 2021-06-18 | Disposition: A | Discharge status: Home / Self Care | Payer: Medicare Other | Source: Skilled Nursing Facility | Attending: Adult Health | Admitting: Adult Health

## 2021-06-18 DIAGNOSIS — I129 Hypertensive chronic kidney disease with stage 1 through stage 4 chronic kidney disease, or unspecified chronic kidney disease: Secondary | ICD-10-CM | POA: Insufficient documentation

## 2021-06-18 DIAGNOSIS — U071 COVID-19: Secondary | ICD-10-CM | POA: Insufficient documentation

## 2021-06-18 DIAGNOSIS — R338 Other retention of urine: Secondary | ICD-10-CM | POA: Insufficient documentation

## 2021-06-18 LAB — C-REACTIVE PROTEIN: CRP: 1 mg/dL — ABNORMAL HIGH (ref <1.0)

## 2021-06-18 LAB — D-DIMER, QUANTITATIVE: D-Dimer, Quant: 2.81 ug/mL-FEU — ABNORMAL HIGH (ref 0.00–0.50)

## 2021-06-19 ENCOUNTER — Encounter: Payer: Self-pay | Admitting: Adult Health

## 2021-06-19 ENCOUNTER — Non-Acute Institutional Stay (SKILLED_NURSING_FACILITY): Payer: Medicare Other | Admitting: Adult Health

## 2021-06-19 DIAGNOSIS — N183 Chronic kidney disease, stage 3 unspecified: Secondary | ICD-10-CM

## 2021-06-19 DIAGNOSIS — R198 Other specified symptoms and signs involving the digestive system and abdomen: Secondary | ICD-10-CM | POA: Diagnosis not present

## 2021-06-19 DIAGNOSIS — E1122 Type 2 diabetes mellitus with diabetic chronic kidney disease: Secondary | ICD-10-CM | POA: Diagnosis not present

## 2021-06-19 DIAGNOSIS — I700 Atherosclerosis of aorta: Secondary | ICD-10-CM | POA: Diagnosis not present

## 2021-06-19 DIAGNOSIS — Z932 Ileostomy status: Secondary | ICD-10-CM

## 2021-06-19 NOTE — Progress Notes (Signed)
?Location:  [place] ?Nursing Home Room Number: 122-W ?Place of Service:  SNF (31) ? ? ?CODE STATUS: Full Code ? ?Allergies  ?Allergen Reactions  ? Nsaids Other (See Comments)  ?  Chronic kidney disease - avoid NSAIDs  ? ? ?Chief Complaint  ?Patient presents with  ? Acute Visit  ?  Care plan meeting  ? ? ?HPI: ? ?We have come together for his care plan meeting. Family present. BIMS 10/15; mood 8/30: poor appetite; tired; trouble sleeping. He is nonambulatory no falls. He requires limited to extensive assist with adls. Has foley and ileostomy. Dietary: weight is 227.2 pounds regular diet; poor appetite; feeds self. Therapy: doing well. In therapy; upper body set up; lower body minor assist; walk 100 feet with supervision; bed mobility transfers are supervision. Decreased activity tolerance. He continues to be followed for his chronic illnesses including: Aortic atherosclerosis  High out put ileostomy   CKD stage 3 due to type 2 diabetes mellitus ? ?Past Medical History:  ?Diagnosis Date  ? Back pain   ? Bipolar disorder ([place])   ? bipolar 1  ? CKD (chronic kidney disease) stage 2, GFR 60-89 ml/min 09/24/2020  ? Hypertension   ? Knee pain   ? Migraine   ? Rectal cancer ([place])   ? Type 2 diabetes mellitus with complication, without long-term current use of insulin ([place]) 03/19/2021  ? ? ?Past Surgical History:  ?Procedure Laterality Date  ? BACK SURGERY    ? JOINT REPLACEMENT Right   ? knee  ? OSTOMY N/A 05/27/2021  ? Procedure: POSSIBLE OSTOMY;  Surgeon: Michael Boston, MD;  [facility];  Service: General;  Laterality: N/A;  ? PORTACATH PLACEMENT N/A 10/02/2020  ? Procedure: PORT-A-CATH PLACEMENT;  Surgeon: Dwan Bolt, MD;  [facility];  Service: General;  Laterality: N/A;  ? PROCTOSCOPY N/A 05/27/2021  ? Procedure: RIGID PROCTOSCOPY;  Surgeon: Michael Boston, MD;  [facility];  Service: General;  Laterality: N/A;  ? XI ROBOTIC ASSISTED LOWER ANTERIOR RESECTION N/A 05/27/2021  ? Procedure:  ROBOTIC LOW ANTERIOR COLON RESECTION, VERY LOW COLOANAL ANASTOMOSIS, DIVERTING ILEOSTOMY, BILATERAL TAP BLOCK,  FIRFLY INJECTION;  Surgeon: Michael Boston, MD;  [facility];  Service: General;  Laterality: N/A;  ? ? ?Social History  ? ?Socioeconomic History  ? Marital status: Divorced  ?  Spouse name: Not on file  ? Number of children: Not on file  ? Years of education: Not on file  ? Highest education level: Not on file  ?Occupational History  ? Not on file  ?Tobacco Use  ? Smoking status: Former  ?  Packs/day: 0.50  ?  Years: 45.00  ?  Pack years: 22.50  ?  Types: Cigarettes  ?  Quit date: 04/22/2021  ?  Years since quitting: 0.1  ? Smokeless tobacco: Never  ?Vaping Use  ? Vaping Use: Never used  ?Substance and Sexual Activity  ? Alcohol use: No  ? Drug use: No  ? Sexual activity: Not on file  ?Other Topics Concern  ? Not on file  ?Social History Narrative  ? Not on file  ? ?Social Determinants of Health  ? ?Financial Resource Strain: Not on file  ?Food Insecurity: Not on file  ?Transportation Needs: Not on file  ?Physical Activity: Not on file  ?Stress: Not on file  ?Social Connections: Not on file  ?Intimate Partner Violence: Not on file  ? ?Family History  ?Problem Relation Age of Onset  ? ALS Mother   ?  Heart disease Mother   ? Parkinson's disease Mother   ? Heart failure Father   ? ? ? ? ?VITAL SIGNS ?BP (!) 146/70   Pulse 72   Temp (!) 97 ?F (36.1 ?C)   Resp 18   Ht 6' (1.829 m)   Wt 227 lb 9.6 oz (103.2 kg)   SpO2 94%   BMI 30.87 kg/m?  ? ?Outpatient Encounter Medications as of 06/19/2021  ?Medication Sig  ? acetaminophen (TYLENOL) 325 MG tablet Take 650 mg by mouth every 6 (six) hours as needed for mild pain.  ? albuterol (VENTOLIN HFA) 108 (90 Base) MCG/ACT inhaler Inhale 2 puffs into the lungs every 6 (six) hours as needed for wheezing or shortness of breath.  ? ALPRAZolam (XANAX) 1 MG tablet Take 1 tablet (1 mg total) by mouth 2 (two) times daily.  ? apixaban (ELIQUIS) 2.5 MG TABS tablet Take  2.5 mg by mouth 2 (two) times daily. Special Instructions: for d-dimer 2.81 ?[DX: 2019-nCoV acute respiratory disease-06/09/21]  ? aspirin EC 81 MG tablet Take 81 mg by mouth daily. Swallow whole.  ? Cholecalciferol (VITAMIN D3) 50 MCG (2000 UT) capsule Take 2,000 Units by mouth daily.  ? Ergocalciferol (VITAMIN D2 PO) Take 1,250 mcg by mouth once a week.  ? Heparin Sodium, Porcine, (HEPARIN LOCK FLUSH IJ) syringe; 10 unit/mL; amt: 10 units/mL; intravenous; Twice A Day ?Special Instructions: Flush port line after each use.  ? lidocaine-prilocaine (EMLA) cream Apply 1 application topically as directed. Apply 1/2 tablespoon to port-a-cath site 2 hours prior to stick and cover with plastic wrap to numb site  ? loperamide (IMODIUM) 2 MG capsule Take 2 mg by mouth every 8 (eight) hours as needed for diarrhea or loose stools.  ? loperamide (IMODIUM) 2 MG capsule Take 1 capsule (2 mg total) by mouth 2 (two) times daily.  ? LOTENSIN HCT 20-12.5 MG per tablet TAKE (2) TABLETS DAILY  ? magnesium oxide (MAG-OX) 400 (240 Mg) MG tablet TAKE ONE TABLET TWICE DAILY  ? metFORMIN (GLUCOPHAGE) 500 MG tablet Take 1 tablet (500 mg total) by mouth 2 (two) times daily with a meal.  ? mirtazapine (REMERON) 45 MG tablet Take 45 mg by mouth at bedtime.  ? Needle, Disp, (HUBER NEEDLE 22GX3/4") 22G X 3/4" MISC 22 x 3/4 "; miscellaneous,Once A Day on Mon ?Special Instructions: change weekly  ? NON FORMULARY Diet: Regular  ? Nutritional Supplements (ENSURE ENLIVE PO) Take by mouth. 120 ml with medpass to protect muscle mass and help meet increased proten/energy needs ?Twice A Day Between Meals  ? omeprazole (PRILOSEC) 20 MG capsule Take 1 capsule (20 mg total) 2 (two) times daily by mouth.  ? ondansetron (ZOFRAN) 8 MG tablet Take 1 tablet (8 mg total) by mouth every 8 (eight) hours as needed for nausea or vomiting.  ? Ostomy Supplies (STOMAHESIVE PROTECTIVE) POWD (ostomy supplies) [OTC]  ?powder; - ; topical Every Shift - PRN ?Special  Instructions: Apply to stoma periwound qshift prn irritation or excoriation.  ? polycarbophil (FIBERCON) 625 MG tablet Take 1 tablet (625 mg total) by mouth 2 (two) times daily.  ? potassium chloride SA (KLOR-CON M) 20 MEQ tablet TAKE ONE TABLET ONCE DAILY  ? pravastatin (PRAVACHOL) 40 MG tablet Take 40 mg by mouth daily.  ? propranolol ER (INDERAL LA) 80 MG 24 hr capsule Take 2 capsules (160 mg total) by mouth daily.  ? risperiDONE (RISPERDAL) 0.25 MG tablet Take 0.125 mg by mouth at bedtime.  ? sodium chloride 0.9 %  injection Special Instructions: Flush port-a-cath with N/S 61m before administering IV fluids. ?Once A Day  ? SUMAtriptan (IMITREX) 100 MG tablet Take 100 mg by mouth 2 (two) times daily as needed for migraine.  ? tamsulosin (FLOMAX) 0.4 MG CAPS capsule Take 1 capsule (0.4 mg total) by mouth daily after breakfast.  ? Throat Lozenges (ZINC W/A&C) LOZG Use as directed in the mouth or throat. 5 Times Per Day x 2 weeks.  ? vitamin C (ASCORBIC ACID) 500 MG tablet Take 500 mg by mouth 2 (two) times daily.  ? prochlorperazine (COMPAZINE) 10 MG tablet Take 1 tablet (10 mg total) by mouth every 6 (six) hours as needed for nausea. (Patient not taking: Reported on 05/12/2021)  ? traMADol (ULTRAM) 50 MG tablet Take 1-2 tablets (50-100 mg total) by mouth every 6 (six) hours as needed for moderate pain. (Patient not taking: Reported on 06/19/2021)  ? ?No facility-administered encounter medications on file as of 06/19/2021.  ? ? ? ?SIGNIFICANT DIAGNOSTIC EXAMS ? ?PREVIOUS  ? ?04-01-21: 2-d echo:  ?1. There is severe asymmetric hypertrophy of the apical segments (up to  18 mm) consistent with apical variant hypertrophic cardiomyopathy. There  is concern for an apical aneurysm on this study, however this was a  difficult study. Would recommend cardiac  MRI for better characterization. Left ventricular ejection fraction, by  estimation, is 55 to 60%. The left ventricle has normal function. The left ventricle has no regional  wall motion abnormalities. There is severe  asymmetric left ventricular  hypertrophy of the apical segment. Left ventricular diastolic parameters  are consistent with Grade I diastolic dysfunction (impaired relaxation).  ? ?

## 2021-06-21 ENCOUNTER — Encounter (HOSPITAL_COMMUNITY)
Admission: RE | Admit: 2021-06-21 | Discharge: 2021-06-21 | Disposition: A | Discharge status: Home / Self Care | Payer: Medicare Other | Source: Skilled Nursing Facility | Attending: Adult Health | Admitting: Adult Health

## 2021-06-21 LAB — BASIC METABOLIC PANEL
Anion gap: 9 (ref 5–15)
BUN: 72 mg/dL — ABNORMAL HIGH (ref 8–23)
CO2: 20 mmol/L — ABNORMAL LOW (ref 22–32)
Calcium: 8.9 mg/dL (ref 8.9–10.3)
Chloride: 101 mmol/L (ref 98–111)
Creatinine, Ser: 5.93 mg/dL — ABNORMAL HIGH (ref 0.61–1.24)
GFR, Estimated: 10 mL/min — ABNORMAL LOW (ref >60)
Glucose, Bld: 159 mg/dL — ABNORMAL HIGH (ref 70–99)
Potassium: 5.9 mmol/L — ABNORMAL HIGH (ref 3.5–5.1)
Sodium: 130 mmol/L — ABNORMAL LOW (ref 135–145)

## 2021-06-21 LAB — D-DIMER, QUANTITATIVE: D-Dimer, Quant: 1.71 ug/mL-FEU — ABNORMAL HIGH (ref 0.00–0.50)

## 2021-06-22 ENCOUNTER — Encounter: Payer: Self-pay | Admitting: Adult Health

## 2021-06-22 ENCOUNTER — Inpatient Hospital Stay (HOSPITAL_COMMUNITY): Admission: RE | Admit: 2021-06-22 | Payer: Medicare Other | Source: Ambulatory Visit

## 2021-06-22 ENCOUNTER — Non-Acute Institutional Stay (SKILLED_NURSING_FACILITY): Payer: Medicare Other | Admitting: Adult Health

## 2021-06-22 ENCOUNTER — Other Ambulatory Visit (HOSPITAL_COMMUNITY)
Admission: RE | Admit: 2021-06-22 | Discharge: 2021-06-22 | Disposition: A | Discharge status: Home / Self Care | Payer: Medicare Other | Source: Skilled Nursing Facility | Attending: Adult Health | Admitting: Adult Health

## 2021-06-22 ENCOUNTER — Encounter (HOSPITAL_COMMUNITY)
Admission: RE | Admit: 2021-06-22 | Discharge: 2021-06-22 | Disposition: A | Discharge status: Home / Self Care | Payer: Medicare Other | Source: Skilled Nursing Facility | Attending: Adult Health | Admitting: Adult Health

## 2021-06-22 DIAGNOSIS — E119 Type 2 diabetes mellitus without complications: Secondary | ICD-10-CM

## 2021-06-22 DIAGNOSIS — N179 Acute kidney failure, unspecified: Secondary | ICD-10-CM

## 2021-06-22 DIAGNOSIS — N1832 Chronic kidney disease, stage 3b: Secondary | ICD-10-CM

## 2021-06-22 DIAGNOSIS — R198 Other specified symptoms and signs involving the digestive system and abdomen: Secondary | ICD-10-CM

## 2021-06-22 DIAGNOSIS — Z932 Ileostomy status: Secondary | ICD-10-CM | POA: Diagnosis not present

## 2021-06-22 DIAGNOSIS — U071 COVID-19: Secondary | ICD-10-CM | POA: Diagnosis not present

## 2021-06-22 DIAGNOSIS — I129 Hypertensive chronic kidney disease with stage 1 through stage 4 chronic kidney disease, or unspecified chronic kidney disease: Secondary | ICD-10-CM | POA: Insufficient documentation

## 2021-06-22 LAB — CBC WITH DIFFERENTIAL/PLATELET
Abs Immature Granulocytes: 0.05 10*3/uL (ref 0.00–0.07)
Basophils Absolute: 0 10*3/uL (ref 0.0–0.1)
Basophils Relative: 0 %
Eosinophils Absolute: 0 10*3/uL (ref 0.0–0.5)
Eosinophils Relative: 1 %
HCT: 31.8 % — ABNORMAL LOW (ref 39.0–52.0)
Hemoglobin: 10.2 g/dL — ABNORMAL LOW (ref 13.0–17.0)
Immature Granulocytes: 1 %
Lymphocytes Relative: 12 %
Lymphs Abs: 0.8 10*3/uL (ref 0.7–4.0)
MCH: 31.4 pg (ref 26.0–34.0)
MCHC: 32.1 g/dL (ref 30.0–36.0)
MCV: 97.8 fL (ref 80.0–100.0)
Monocytes Absolute: 0.6 10*3/uL (ref 0.1–1.0)
Monocytes Relative: 9 %
Neutro Abs: 5.1 10*3/uL (ref 1.7–7.7)
Neutrophils Relative %: 77 %
Platelets: 175 10*3/uL (ref 150–400)
RBC: 3.25 MIL/uL — ABNORMAL LOW (ref 4.22–5.81)
RDW: 14.2 % (ref 11.5–15.5)
WBC: 6.6 10*3/uL (ref 4.0–10.5)
nRBC: 0 % (ref 0.0–0.2)

## 2021-06-22 LAB — BASIC METABOLIC PANEL
Anion gap: 6 (ref 5–15)
BUN: 70 mg/dL — ABNORMAL HIGH (ref 8–23)
CO2: 22 mmol/L (ref 22–32)
Calcium: 8.7 mg/dL — ABNORMAL LOW (ref 8.9–10.3)
Chloride: 104 mmol/L (ref 98–111)
Creatinine, Ser: 5.39 mg/dL — ABNORMAL HIGH (ref 0.61–1.24)
GFR, Estimated: 11 mL/min — ABNORMAL LOW (ref >60)
Glucose, Bld: 165 mg/dL — ABNORMAL HIGH (ref 70–99)
Potassium: 5.8 mmol/L — ABNORMAL HIGH (ref 3.5–5.1)
Sodium: 132 mmol/L — ABNORMAL LOW (ref 135–145)

## 2021-06-22 LAB — D-DIMER, QUANTITATIVE: D-Dimer, Quant: 1.77 ug/mL-FEU — ABNORMAL HIGH (ref 0.00–0.50)

## 2021-06-22 NOTE — Progress Notes (Signed)
?Location:  [place] ?Nursing Home Room Number: 122-W ?Place of Service:  SNF (31) ? ? ?CODE STATUS: Full Code ? ?Allergies  ?Allergen Reactions  ? Nsaids Other (See Comments)  ?  Chronic kidney disease - avoid NSAIDs  ? ? ?Chief Complaint  ?Patient presents with  ? Acute Visit  ?  Follow-up labs  ? ? ?HPI: ? ?The goal of his care is to return back home. He has a high output ileostomy. He is on vif 1 liter daily. This was stopped as a trial. He will not be ablt to return back home with the IVF; there will not be anyone who can do this for him. Over the past weekend he had anuria. He was started on VIF NS at 125 for one liter then 100 cc per hour. His creat today is 5.39 which is unchanged from yesterday. He does have a small of urine in his foley. His d-dimer remains elevated at 1.77.  ? ?Past Medical History:  ?Diagnosis Date  ? Back pain   ? Bipolar disorder ([place])   ? bipolar 1  ? CKD (chronic kidney disease) stage 2, GFR 60-89 ml/min 09/24/2020  ? Hypertension   ? Knee pain   ? Migraine   ? Rectal cancer ([place])   ? Type 2 diabetes mellitus with complication, without long-term current use of insulin ([place]) 03/19/2021  ? ? ?Past Surgical History:  ?Procedure Laterality Date  ? BACK SURGERY    ? JOINT REPLACEMENT Right   ? knee  ? OSTOMY N/A 05/27/2021  ? Procedure: POSSIBLE OSTOMY;  Surgeon: Michael Boston, MD;  Location: [facility];  Service: General;  Laterality: N/A;  ? PORTACATH PLACEMENT N/A 10/02/2020  ? Procedure: PORT-A-CATH PLACEMENT;  Surgeon: Dwan Bolt, MD;  Location: [facility];  Service: General;  Laterality: N/A;  ? PROCTOSCOPY N/A 05/27/2021  ? Procedure: RIGID PROCTOSCOPY;  Surgeon: Michael Boston, MD;  Location: [facility];  Service: General;  Laterality: N/A;  ? XI ROBOTIC ASSISTED LOWER ANTERIOR RESECTION N/A 05/27/2021  ? Procedure: ROBOTIC LOW ANTERIOR COLON RESECTION, VERY LOW COLOANAL ANASTOMOSIS, DIVERTING ILEOSTOMY, BILATERAL TAP BLOCK,  FIRFLY INJECTION;  Surgeon: Michael Boston, MD;   Location: [facility];  Service: General;  Laterality: N/A;  ? ? ?Social History  ? ?Socioeconomic History  ? Marital status: Divorced  ?  Spouse name: Not on file  ? Number of children: Not on file  ? Years of education: Not on file  ? Highest education level: Not on file  ?Occupational History  ? Not on file  ?Tobacco Use  ? Smoking status: Former  ?  Packs/day: 0.50  ?  Years: 45.00  ?  Pack years: 22.50  ?  Types: Cigarettes  ?  Quit date: 04/22/2021  ?  Years since quitting: 0.1  ? Smokeless tobacco: Never  ?Vaping Use  ? Vaping Use: Never used  ?Substance and Sexual Activity  ? Alcohol use: No  ? Drug use: No  ? Sexual activity: Not on file  ?Other Topics Concern  ? Not on file  ?Social History Narrative  ? Not on file  ? ?Social Determinants of Health  ? ?Financial Resource Strain: Not on file  ?Food Insecurity: Not on file  ?Transportation Needs: Not on file  ?Physical Activity: Not on file  ?Stress: Not on file  ?Social Connections: Not on file  ?Intimate Partner Violence: Not on file  ? ?Family History  ?Problem Relation Age of Onset  ? ALS Mother   ?  Heart disease Mother   ? Parkinson's disease Mother   ? Heart failure Father   ? ? ? ? ?VITAL SIGNS ?BP (!) 71/49   Pulse (!) 104   Temp 97.6 ?F (36.4 ?C)   Resp 18   Ht 6' (1.829 m)   Wt 227 lb 9.6 oz (103.2 kg)   SpO2 90%   BMI 30.87 kg/m?  ? ?Outpatient Encounter Medications as of 06/22/2021  ?Medication Sig  ? acetaminophen (TYLENOL) 325 MG tablet Take 650 mg by mouth every 6 (six) hours as needed for mild pain.  ? albuterol (VENTOLIN HFA) 108 (90 Base) MCG/ACT inhaler Inhale 2 puffs into the lungs every 6 (six) hours as needed for wheezing or shortness of breath.  ? ALPRAZolam (XANAX) 1 MG tablet Take 1 tablet (1 mg total) by mouth 2 (two) times daily.  ? apixaban (ELIQUIS) 2.5 MG TABS tablet Take 2.5 mg by mouth 2 (two) times daily. Special Instructions: for d-dimer 2.81 ?[DX: 2019-nCoV acute respiratory disease-06/09/21]  ? aspirin EC 81 MG tablet Take  81 mg by mouth daily. Swallow whole.  ? Cholecalciferol (VITAMIN D3) 50 MCG (2000 UT) capsule Take 2,000 Units by mouth daily.  ? Ergocalciferol (VITAMIN D2 PO) Take 1,250 mcg by mouth once a week.  ? Heparin Sodium, Porcine, (HEPARIN LOCK FLUSH IJ) syringe; 10 unit/mL; amt: 10 units/mL; intravenous; Twice A Day ?Special Instructions: Flush port line after each use.  ? loperamide (IMODIUM) 2 MG capsule Take 2 mg by mouth every 8 (eight) hours as needed for diarrhea or loose stools.  ? loperamide (IMODIUM) 2 MG capsule Take 1 capsule (2 mg total) by mouth 2 (two) times daily.  ? LOTENSIN HCT 20-12.5 MG per tablet TAKE (2) TABLETS DAILY  ? magnesium oxide (MAG-OX) 400 (240 Mg) MG tablet TAKE ONE TABLET TWICE DAILY  ? metFORMIN (GLUCOPHAGE) 500 MG tablet Take 1 tablet (500 mg total) by mouth 2 (two) times daily with a meal.  ? mirtazapine (REMERON) 45 MG tablet Take 45 mg by mouth at bedtime.  ? Needle, Disp, (HUBER NEEDLE 22GX3/4") 22G X 3/4" MISC 22 x 3/4 "; miscellaneous,Once A Day on Mon ?Special Instructions: change weekly  ? NON FORMULARY Diet: Regular  ? Nutritional Supplements (ENSURE ENLIVE PO) Take by mouth. 120 ml with medpass to protect muscle mass and help meet increased proten/energy needs ?Twice A Day Between Meals  ? omeprazole (PRILOSEC) 20 MG capsule Take 1 capsule (20 mg total) 2 (two) times daily by mouth.  ? ondansetron (ZOFRAN) 8 MG tablet Take 1 tablet (8 mg total) by mouth every 8 (eight) hours as needed for nausea or vomiting.  ? Ostomy Supplies (STOMAHESIVE PROTECTIVE) POWD (ostomy supplies) [OTC]  ?powder; - ; topical Every Shift - PRN ?Special Instructions: Apply to stoma periwound qshift prn irritation or excoriation.  ? polycarbophil (FIBERCON) 625 MG tablet Take 1 tablet (625 mg total) by mouth 2 (two) times daily.  ? potassium chloride SA (KLOR-CON M) 20 MEQ tablet TAKE ONE TABLET ONCE DAILY  ? pravastatin (PRAVACHOL) 40 MG tablet Take 40 mg by mouth daily.  ? propranolol ER (INDERAL LA)  80 MG 24 hr capsule Take 2 capsules (160 mg total) by mouth daily.  ? risperiDONE (RISPERDAL) 0.25 MG tablet Take 0.125 mg by mouth at bedtime.  ? sodium chloride 0.9 % injection Special Instructions: Flush port-a-cath with N/S 65m before administering IV fluids. ?Once A Day  ? SUMAtriptan (IMITREX) 100 MG tablet Take 100 mg by mouth 2 (two)  times daily as needed for migraine.  ? tamsulosin (FLOMAX) 0.4 MG CAPS capsule Take 1 capsule (0.4 mg total) by mouth daily after breakfast.  ? Throat Lozenges (ZINC W/A&C) LOZG Use as directed in the mouth or throat. 5 Times Per Day x 2 weeks.  ? vitamin C (ASCORBIC ACID) 500 MG tablet Take 500 mg by mouth 2 (two) times daily.  ? lidocaine-prilocaine (EMLA) cream Apply 1 application topically as directed. Apply 1/2 tablespoon to port-a-cath site 2 hours prior to stick and cover with plastic wrap to numb site  ? prochlorperazine (COMPAZINE) 10 MG tablet Take 1 tablet (10 mg total) by mouth every 6 (six) hours as needed for nausea. (Patient not taking: Reported on 05/12/2021)  ? traMADol (ULTRAM) 50 MG tablet Take 1-2 tablets (50-100 mg total) by mouth every 6 (six) hours as needed for moderate pain. (Patient not taking: Reported on 06/19/2021)  ? ?No facility-administered encounter medications on file as of 06/22/2021.  ? ? ? ?SIGNIFICANT DIAGNOSTIC EXAMS ? ? ?PREVIOUS  ? ?04-01-21: 2-d echo:  ?1. There is severe asymmetric hypertrophy of the apical segments (up to  18 mm) consistent with apical variant hypertrophic cardiomyopathy. There  is concern for an apical aneurysm on this study, however this was a  difficult study. Would recommend cardiac  MRI for better characterization. Left ventricular ejection fraction, by  estimation, is 55 to 60%. The left ventricle has normal function. The left ventricle has no regional wall motion abnormalities. There is severe  asymmetric left ventricular  hypertrophy of the apical segment. Left ventricular diastolic parameters  are consistent with  Grade I diastolic dysfunction (impaired relaxation).  ? ?05-29-21: renal ultrasound ?Redemonstration of right renal cyst without evidence of superimposed hydronephrosis. Greatest dimension estimated 8.2 cm. ?Left ren

## 2021-06-23 ENCOUNTER — Non-Acute Institutional Stay (SKILLED_NURSING_FACILITY): Payer: Medicare Other | Admitting: Adult Health

## 2021-06-23 ENCOUNTER — Encounter: Payer: Self-pay | Admitting: Adult Health

## 2021-06-23 ENCOUNTER — Other Ambulatory Visit (HOSPITAL_COMMUNITY)
Admission: RE | Admit: 2021-06-23 | Discharge: 2021-06-23 | Disposition: A | Discharge status: Home / Self Care | Payer: Medicare Other | Source: Skilled Nursing Facility | Attending: Adult Health | Admitting: Adult Health

## 2021-06-23 DIAGNOSIS — Z932 Ileostomy status: Secondary | ICD-10-CM

## 2021-06-23 DIAGNOSIS — N1832 Chronic kidney disease, stage 3b: Secondary | ICD-10-CM

## 2021-06-23 DIAGNOSIS — I129 Hypertensive chronic kidney disease with stage 1 through stage 4 chronic kidney disease, or unspecified chronic kidney disease: Secondary | ICD-10-CM | POA: Insufficient documentation

## 2021-06-23 DIAGNOSIS — N179 Acute kidney failure, unspecified: Secondary | ICD-10-CM

## 2021-06-23 DIAGNOSIS — E43 Unspecified severe protein-calorie malnutrition: Secondary | ICD-10-CM

## 2021-06-23 DIAGNOSIS — R198 Other specified symptoms and signs involving the digestive system and abdomen: Secondary | ICD-10-CM | POA: Diagnosis not present

## 2021-06-23 LAB — COMPREHENSIVE METABOLIC PANEL
ALT: 29 U/L (ref 0–44)
AST: 19 U/L (ref 15–41)
Albumin: 2.8 g/dL — ABNORMAL LOW (ref 3.5–5.0)
Alkaline Phosphatase: 121 U/L (ref 38–126)
Anion gap: 7 (ref 5–15)
BUN: 54 mg/dL — ABNORMAL HIGH (ref 8–23)
CO2: 20 mmol/L — ABNORMAL LOW (ref 22–32)
Calcium: 8.9 mg/dL (ref 8.9–10.3)
Chloride: 109 mmol/L (ref 98–111)
Creatinine, Ser: 3.12 mg/dL — ABNORMAL HIGH (ref 0.61–1.24)
GFR, Estimated: 21 mL/min — ABNORMAL LOW (ref >60)
Glucose, Bld: 191 mg/dL — ABNORMAL HIGH (ref 70–99)
Potassium: 5.2 mmol/L — ABNORMAL HIGH (ref 3.5–5.1)
Sodium: 136 mmol/L (ref 135–145)
Total Bilirubin: 0.5 mg/dL (ref 0.3–1.2)
Total Protein: 6.2 g/dL — ABNORMAL LOW (ref 6.5–8.1)

## 2021-06-23 NOTE — Progress Notes (Signed)
?Location:  [place] ?Nursing Home Room Number: N/122/W ?Place of Service:  SNF (31) ? ? ?CODE STATUS: FULL ? ?Allergies  ?Allergen Reactions  ? Nsaids Other (See Comments)  ?  Chronic kidney disease - avoid NSAIDs  ? ? ?Chief Complaint  ?Patient presents with  ? Acute Visit  ?  Follow up lab  ? ? ?HPI: ? ?He is being seen for his acute renal failure. His bun 54; creat 3.12. there is improvement in his creat from 5.9.  he is presently receiving NS at 125 cc per hour. He is denying any uncontrolled pain. There are no reports of nausea or vomiting. His ileostomy continues with increased output.  ? ?Past Medical History:  ?Diagnosis Date  ? Back pain   ? Bipolar disorder ([place])   ? bipolar 1  ? CKD (chronic kidney disease) stage 2, GFR 60-89 ml/min 09/24/2020  ? Hypertension   ? Knee pain   ? Migraine   ? Rectal cancer ([place])   ? Type 2 diabetes mellitus with complication, without long-term current use of insulin ([place]) 03/19/2021  ? ? ?Past Surgical History:  ?Procedure Laterality Date  ? BACK SURGERY    ? JOINT REPLACEMENT Right   ? knee  ? OSTOMY N/A 05/27/2021  ? Procedure: POSSIBLE OSTOMY;  Surgeon: Michael Boston, MD;  [facility];  Service: General;  Laterality: N/A;  ? PORTACATH PLACEMENT N/A 10/02/2020  ? Procedure: PORT-A-CATH PLACEMENT;  Surgeon: Dwan Bolt, MD;  [facility];  Service: General;  Laterality: N/A;  ? PROCTOSCOPY N/A 05/27/2021  ? Procedure: RIGID PROCTOSCOPY;  Surgeon: Michael Boston, MD;  [facility];  Service: General;  Laterality: N/A;  ? XI ROBOTIC ASSISTED LOWER ANTERIOR RESECTION N/A 05/27/2021  ? Procedure: ROBOTIC LOW ANTERIOR COLON RESECTION, VERY LOW COLOANAL ANASTOMOSIS, DIVERTING ILEOSTOMY, BILATERAL TAP BLOCK,  FIRFLY INJECTION;  Surgeon: Michael Boston, MD;  [facility];  Service: General;  Laterality: N/A;  ? ? ?Social History  ? ?Socioeconomic History  ? Marital status: Divorced  ?  Spouse name: Not on file  ? Number of children: Not on file  ? Years  of education: Not on file  ? Highest education level: Not on file  ?Occupational History  ? Not on file  ?Tobacco Use  ? Smoking status: Former  ?  Packs/day: 0.50  ?  Years: 45.00  ?  Pack years: 22.50  ?  Types: Cigarettes  ?  Quit date: 04/22/2021  ?  Years since quitting: 0.1  ? Smokeless tobacco: Never  ?Vaping Use  ? Vaping Use: Never used  ?Substance and Sexual Activity  ? Alcohol use: No  ? Drug use: No  ? Sexual activity: Not on file  ?Other Topics Concern  ? Not on file  ?Social History Narrative  ? Not on file  ? ?Social Determinants of Health  ? ?Financial Resource Strain: Not on file  ?Food Insecurity: Not on file  ?Transportation Needs: Not on file  ?Physical Activity: Not on file  ?Stress: Not on file  ?Social Connections: Not on file  ?Intimate Partner Violence: Not on file  ? ?Family History  ?Problem Relation Age of Onset  ? ALS Mother   ? Heart disease Mother   ? Parkinson's disease Mother   ? Heart failure Father   ? ? ? ? ?VITAL SIGNS ?BP 132/78   Pulse 76   Temp 97.6 ?F (36.4 ?C)   Resp 20   Wt 227 lb 9.6 oz (  103.2 kg)   SpO2 95%   BMI 30.87 kg/m?  ? ?Outpatient Encounter Medications as of 06/23/2021  ?Medication Sig  ? acetaminophen (TYLENOL) 325 MG tablet Take 650 mg by mouth every 6 (six) hours as needed for mild pain.  ? albuterol (VENTOLIN HFA) 108 (90 Base) MCG/ACT inhaler Inhale 2 puffs into the lungs every 6 (six) hours as needed for wheezing or shortness of breath.  ? ALPRAZolam (XANAX) 1 MG tablet Take 1 tablet (1 mg total) by mouth 2 (two) times daily.  ? apixaban (ELIQUIS) 2.5 MG TABS tablet Take 2.5 mg by mouth 2 (two) times daily. Special Instructions: for d-dimer 2.81 ?[DX: 2019-nCoV acute respiratory disease-06/09/21]  ? aspirin EC 81 MG tablet Take 81 mg by mouth daily. Swallow whole.  ? Cholecalciferol (VITAMIN D3) 50 MCG (2000 UT) capsule Take 2,000 Units by mouth daily.  ? Ergocalciferol (VITAMIN D2 PO) Take 1,250 mcg by mouth once a week.  ? Heparin Sodium, Porcine,  (HEPARIN LOCK FLUSH IJ) syringe; 10 unit/mL; amt: 10 units/mL; intravenous; Twice A Day ?Special Instructions: Flush port line after each use.  ? lidocaine-prilocaine (EMLA) cream Apply 1 application topically as directed. Apply 1/2 tablespoon to port-a-cath site 2 hours prior to stick and cover with plastic wrap to numb site  ? loperamide (IMODIUM) 2 MG capsule Take 2 mg by mouth every 8 (eight) hours as needed for diarrhea or loose stools.  ? loperamide (IMODIUM) 2 MG capsule Take 1 capsule (2 mg total) by mouth 2 (two) times daily.  ? LOTENSIN HCT 20-12.5 MG per tablet TAKE (2) TABLETS DAILY  ? magnesium oxide (MAG-OX) 400 (240 Mg) MG tablet TAKE ONE TABLET TWICE DAILY  ? metFORMIN (GLUCOPHAGE) 500 MG tablet Take 1 tablet (500 mg total) by mouth 2 (two) times daily with a meal.  ? mirtazapine (REMERON) 45 MG tablet Take 45 mg by mouth at bedtime.  ? Needle, Disp, (HUBER NEEDLE 22GX3/4") 22G X 3/4" MISC 22 x 3/4 "; miscellaneous,Once A Day on Mon ?Special Instructions: change weekly  ? NON FORMULARY Diet: Regular  ? Nutritional Supplements (ENSURE ENLIVE PO) Take by mouth. 120 ml with medpass to protect muscle mass and help meet increased proten/energy needs ?Twice A Day Between Meals  ? omeprazole (PRILOSEC) 20 MG capsule Take 1 capsule (20 mg total) 2 (two) times daily by mouth.  ? ondansetron (ZOFRAN) 8 MG tablet Take 1 tablet (8 mg total) by mouth every 8 (eight) hours as needed for nausea or vomiting.  ? Ostomy Supplies (STOMAHESIVE PROTECTIVE) POWD (ostomy supplies) [OTC]  ?powder; - ; topical Every Shift - PRN ?Special Instructions: Apply to stoma periwound qshift prn irritation or excoriation.  ? polycarbophil (FIBERCON) 625 MG tablet Take 1 tablet (625 mg total) by mouth 2 (two) times daily.  ? potassium chloride SA (KLOR-CON M) 20 MEQ tablet TAKE ONE TABLET ONCE DAILY  ? pravastatin (PRAVACHOL) 40 MG tablet Take 40 mg by mouth daily.  ? propranolol (INNOPRAN XL) 80 MG 24 hr capsule Take 160 mg by mouth  daily. Take 2 80 mg po one time daily  ? risperiDONE (RISPERDAL) 0.25 MG tablet Take 0.125 mg by mouth at bedtime.  ? sodium chloride 0.9 % injection Special Instructions: Flush port-a-cath with N/S 58m before administering IV fluids. ?Once A Day  ? SUMAtriptan (IMITREX) 100 MG tablet Take 100 mg by mouth 2 (two) times daily as needed for migraine.  ? tamsulosin (FLOMAX) 0.4 MG CAPS capsule Take 1 capsule (0.4 mg total) by  mouth daily after breakfast.  ? Throat Lozenges (ZINC W/A&C) LOZG Use as directed in the mouth or throat. 5 Times Per Day x 2 weeks.  ? vitamin C (ASCORBIC ACID) 500 MG tablet Take 500 mg by mouth 2 (two) times daily.  ? prochlorperazine (COMPAZINE) 10 MG tablet Take 1 tablet (10 mg total) by mouth every 6 (six) hours as needed for nausea. (Patient not taking: Reported on 05/12/2021)  ? traMADol (ULTRAM) 50 MG tablet Take 1-2 tablets (50-100 mg total) by mouth every 6 (six) hours as needed for moderate pain. (Patient not taking: Reported on 06/19/2021)  ? [DISCONTINUED] propranolol ER (INDERAL LA) 80 MG 24 hr capsule Take 2 capsules (160 mg total) by mouth daily.  ? ?No facility-administered encounter medications on file as of 06/23/2021.  ? ? ? ?SIGNIFICANT DIAGNOSTIC EXAMS ? ?PREVIOUS  ? ?04-01-21: 2-d echo:  ?1. There is severe asymmetric hypertrophy of the apical segments (up to  18 mm) consistent with apical variant hypertrophic cardiomyopathy. There  is concern for an apical aneurysm on this study, however this was a  difficult study. Would recommend cardiac  MRI for better characterization. Left ventricular ejection fraction, by  estimation, is 55 to 60%. The left ventricle has normal function. The left ventricle has no regional wall motion abnormalities. There is severe  asymmetric left ventricular  hypertrophy of the apical segment. Left ventricular diastolic parameters  are consistent with Grade I diastolic dysfunction (impaired relaxation).  ? ?05-29-21: renal ultrasound ?Redemonstration of  right renal cyst without evidence of superimposed hydronephrosis. Greatest dimension estimated 8.2 cm. ?Left renal cortex not well evaluated and is atrophic/replaced by chronic cystic change/hydronephrosis in the

## 2021-06-24 ENCOUNTER — Non-Acute Institutional Stay (SKILLED_NURSING_FACILITY): Payer: Medicare Other | Admitting: Internal Medicine

## 2021-06-24 ENCOUNTER — Encounter: Payer: Self-pay | Admitting: Internal Medicine

## 2021-06-24 ENCOUNTER — Other Ambulatory Visit (HOSPITAL_COMMUNITY)
Admission: RE | Admit: 2021-06-24 | Discharge: 2021-06-24 | Disposition: A | Discharge status: Home / Self Care | Payer: Medicare Other | Source: Skilled Nursing Facility | Attending: Internal Medicine | Admitting: Internal Medicine

## 2021-06-24 DIAGNOSIS — C20 Malignant neoplasm of rectum: Secondary | ICD-10-CM | POA: Insufficient documentation

## 2021-06-24 DIAGNOSIS — D62 Acute posthemorrhagic anemia: Secondary | ICD-10-CM | POA: Insufficient documentation

## 2021-06-24 DIAGNOSIS — N179 Acute kidney failure, unspecified: Secondary | ICD-10-CM | POA: Diagnosis not present

## 2021-06-24 DIAGNOSIS — E118 Type 2 diabetes mellitus with unspecified complications: Secondary | ICD-10-CM | POA: Diagnosis not present

## 2021-06-24 DIAGNOSIS — R198 Other specified symptoms and signs involving the digestive system and abdomen: Secondary | ICD-10-CM

## 2021-06-24 DIAGNOSIS — N189 Chronic kidney disease, unspecified: Secondary | ICD-10-CM | POA: Insufficient documentation

## 2021-06-24 DIAGNOSIS — E1122 Type 2 diabetes mellitus with diabetic chronic kidney disease: Secondary | ICD-10-CM | POA: Insufficient documentation

## 2021-06-24 DIAGNOSIS — R1011 Right upper quadrant pain: Secondary | ICD-10-CM | POA: Diagnosis not present

## 2021-06-24 DIAGNOSIS — I129 Hypertensive chronic kidney disease with stage 1 through stage 4 chronic kidney disease, or unspecified chronic kidney disease: Secondary | ICD-10-CM | POA: Insufficient documentation

## 2021-06-24 DIAGNOSIS — U071 COVID-19: Secondary | ICD-10-CM | POA: Insufficient documentation

## 2021-06-24 DIAGNOSIS — Z932 Ileostomy status: Secondary | ICD-10-CM

## 2021-06-24 DIAGNOSIS — N1832 Chronic kidney disease, stage 3b: Secondary | ICD-10-CM | POA: Insufficient documentation

## 2021-06-24 LAB — BASIC METABOLIC PANEL
Anion gap: 5 (ref 5–15)
BUN: 47 mg/dL — ABNORMAL HIGH (ref 8–23)
CO2: 22 mmol/L (ref 22–32)
Calcium: 8.8 mg/dL — ABNORMAL LOW (ref 8.9–10.3)
Chloride: 113 mmol/L — ABNORMAL HIGH (ref 98–111)
Creatinine, Ser: 2.4 mg/dL — ABNORMAL HIGH (ref 0.61–1.24)
GFR, Estimated: 29 mL/min — ABNORMAL LOW (ref >60)
Glucose, Bld: 120 mg/dL — ABNORMAL HIGH (ref 70–99)
Potassium: 6 mmol/L — ABNORMAL HIGH (ref 3.5–5.1)
Sodium: 140 mmol/L (ref 135–145)

## 2021-06-24 NOTE — Patient Instructions (Signed)
See assessment and plan under each diagnosis in the problem list and acutely for this visit 

## 2021-06-24 NOTE — Assessment & Plan Note (Addendum)
He failed trial of oral fluid repletion, necessitating IV fluids.  This is in the context of recent COVID and AKI with acute renal insufficiency stage IV.  Serially this is improving. ?Up to Date states that tamsulosin can be associated with diarrhea in 6% of the cases.  As he has a Foley; the tamsulosin will be discontinued temporarily. ?If high-output failure persist; the loperamide will be changed to diphenoxylate/atropine (Lomotil). ?Because of the intermittent abdominal pain; KUB & lipase will be performed.  Clinically at this time he does not have an ileus. ?

## 2021-06-24 NOTE — Progress Notes (Signed)
? ?NURSING HOME LOCATION:  [place] ?ROOM NUMBER:122 W   ? ?CODE STATUS:  Full ? ?PCP:  Aura Dials MD ? ?This is a nursing facility follow up visit for specific acute issue of AKI in context of high output ileostomy & recent COVID 19 infection. ?Interim medical record and care since last SNF visit was updated with review of diagnostic studies and change in clinical status since last visit were documented. ? ?HPI: He has high output from his ileostomy.  Attempts were made here to replete the fluid deficits orally but this trial failed as creatinine rose as high as 5.93 with GFR nadir of 10 on 4/16. Current creat was 3.12 & GFR  21. IV fluids would not be an option in the home environment. ?This situation is in the context of positive COVID on 4/4.  D-dimer peaked at 7.64 but subsequently dropped to 1.77.  C-reactive protein peaked at 10.7 but as of 4/19 was 1.0. ?At this time he is on Lotensin/HCT and metformin 500 mg twice daily.  The antimotility agent loperamide is being employed.  He is also on tamsulosin. ? ?Review of systems: He states that he has had a nonproductive cough and loss of taste with COVID.  His major issue is abdominal pain intermittently over the last 5 days.  This is described as sharp and throbbing and mainly localized around the ileostomy.It can last hours. ? ?Constitutional: No fever, significant weight change  ?Eyes: No redness, discharge, pain, vision change ?ENT/mouth: No nasal congestion,  purulent discharge, earache, change in hearing, sore throat  ?Cardiovascular: No chest pain, palpitations, paroxysmal nocturnal dyspnea, claudication, edema  ?Respiratory: No  sputum production, hemoptysis, , significant snoring, apnea   ?Gastrointestinal: No heartburn, dysphagia, nausea /vomiting, rectal bleeding, melena ?Genitourinary: No dysuria, hematuria, pyuria ?Musculoskeletal: No joint stiffness, joint swelling, weakness, pain ?Dermatologic: No rash, pruritus, change in  appearance of skin ?Neurologic: No dizziness, headache, syncope, seizures, numbness, tingling ?Endocrine: No change in hair/skin/nails,  excessive hunger, excessive urination  ?Hematologic/lymphatic: No significant bruising, lymphadenopathy, abnormal bleeding ? ?Physical exam:  ?Pertinent or positive findings: He appears chronically ill. He is hoarse. He has full beard and mustache.  He is edentulous.  The tongue is markedly dry. Cath present at the right upper chest.  Heart sounds are somewhat distant and breath sounds are also decreased.  He has minor low-grade expiratory rhonchi.  He has minimally increase bowel sounds without clinical ileus.  There is symmetrical fullness in the left abdomen but this area is nontender to palpation.  He describes some discomfort to palpation medial to the ileostomy.  Foley catheter in place.  Pedal pulses are decreased.  Only slight tenting is present. ? ?General appearance: no acute distress, increased work of breathing is present.   ?Lymphatic: No lymphadenopathy about the head, neck, axilla. ?Eyes: No conjunctival inflammation or lid edema is present. There is no scleral icterus. ?Ears:  External ear exam shows no significant lesions or deformities.   ?Nose:  External nasal examination shows no deformity or inflammation. Nasal mucosa are pink and moist without lesions, exudates ?Oral exam:  There is no oropharyngeal erythema or exudate. ?Neck:  No thyromegaly, masses, tenderness noted.    ?Heart:  No gallop, murmur, click, rub .  ?Lungs:  without wheezes, rales, rubs. ?Abdomen:Abdomen with no organomegaly, hernias, masses. ?GU: Deferred  ?Extremities:  No cyanosis, clubbing, edema  ?Skin: No significant lesions or rash. ? ?See summary under each active problem in the Problem List with  associated updated therapeutic plan ? ? ?

## 2021-06-24 NOTE — Assessment & Plan Note (Addendum)
Glucoses have been well controlled; glucose today was 97.  There was only 1 value over 200.  Metformin will be discontinued because of the AKI. ?If persistent hyperglycemia is present; as needed insulin will be initiated. ?

## 2021-06-24 NOTE — Assessment & Plan Note (Addendum)
Creatinine peaked at 5.93 with a nadir GFR of 10 on 4/16.  Discussion with NP has prompted discontinuation of the Lotensin/HCT and metformin.Monitor BMET serially. ?

## 2021-06-25 ENCOUNTER — Encounter: Payer: Self-pay | Admitting: Adult Health

## 2021-06-25 ENCOUNTER — Other Ambulatory Visit (HOSPITAL_COMMUNITY)
Admission: RE | Admit: 2021-06-25 | Discharge: 2021-06-25 | Disposition: A | Discharge status: Home / Self Care | Payer: Medicare Other | Source: Skilled Nursing Facility | Attending: Adult Health | Admitting: Adult Health

## 2021-06-25 ENCOUNTER — Non-Acute Institutional Stay (SKILLED_NURSING_FACILITY): Payer: Medicare Other | Admitting: Adult Health

## 2021-06-25 DIAGNOSIS — U071 COVID-19: Secondary | ICD-10-CM | POA: Diagnosis not present

## 2021-06-25 DIAGNOSIS — D6869 Other thrombophilia: Secondary | ICD-10-CM | POA: Diagnosis not present

## 2021-06-25 DIAGNOSIS — Z932 Ileostomy status: Secondary | ICD-10-CM | POA: Diagnosis not present

## 2021-06-25 DIAGNOSIS — E43 Unspecified severe protein-calorie malnutrition: Secondary | ICD-10-CM | POA: Insufficient documentation

## 2021-06-25 DIAGNOSIS — R198 Other specified symptoms and signs involving the digestive system and abdomen: Secondary | ICD-10-CM

## 2021-06-25 DIAGNOSIS — N179 Acute kidney failure, unspecified: Secondary | ICD-10-CM

## 2021-06-25 DIAGNOSIS — I129 Hypertensive chronic kidney disease with stage 1 through stage 4 chronic kidney disease, or unspecified chronic kidney disease: Secondary | ICD-10-CM | POA: Insufficient documentation

## 2021-06-25 DIAGNOSIS — N1832 Chronic kidney disease, stage 3b: Secondary | ICD-10-CM

## 2021-06-25 LAB — BASIC METABOLIC PANEL
Anion gap: 6 (ref 5–15)
BUN: 45 mg/dL — ABNORMAL HIGH (ref 8–23)
CO2: 19 mmol/L — ABNORMAL LOW (ref 22–32)
Calcium: 8.4 mg/dL — ABNORMAL LOW (ref 8.9–10.3)
Chloride: 113 mmol/L — ABNORMAL HIGH (ref 98–111)
Creatinine, Ser: 2.91 mg/dL — ABNORMAL HIGH (ref 0.61–1.24)
GFR, Estimated: 23 mL/min — ABNORMAL LOW (ref >60)
Glucose, Bld: 119 mg/dL — ABNORMAL HIGH (ref 70–99)
Potassium: 5.2 mmol/L — ABNORMAL HIGH (ref 3.5–5.1)
Sodium: 138 mmol/L (ref 135–145)

## 2021-06-25 LAB — D-DIMER, QUANTITATIVE: D-Dimer, Quant: 2.02 ug/mL-FEU — ABNORMAL HIGH (ref 0.00–0.50)

## 2021-06-25 LAB — LIPASE, BLOOD: Lipase: 31 U/L (ref 11–51)

## 2021-06-25 NOTE — Progress Notes (Signed)
?Location:  [facility] ?Nursing Home Room Number: 122-W ?Place of Service:  SNF (31) ? ? ?CODE STATUS: Full Code ? ?Allergies  ?Allergen Reactions  ? Nsaids Other (See Comments)  ?  Chronic kidney disease - avoid NSAIDs  ? ? ?Chief Complaint  ?Patient presents with  ? Acute Visit  ?  Follow-up labs  ? ? ?HPI: ? ?His d-dimer is slowly improving is at 2.02. he remains on eliquis 2.5 mg twice daily. There are no indications of thrombi present. He continues on NS at 125 cc per hour; he continues to have acute renal failure.  ? ? ?Past Medical History:  ?Diagnosis Date  ? Back pain   ? Bipolar disorder ([place])   ? bipolar 1  ? CKD (chronic kidney disease) stage 2, GFR 60-89 ml/min 09/24/2020  ? Hypertension   ? Knee pain   ? Migraine   ? Rectal cancer ([place])   ? Type 2 diabetes mellitus with complication, without long-term current use of insulin ([place]) 03/19/2021  ? ? ?Past Surgical History:  ?Procedure Laterality Date  ? BACK SURGERY    ? JOINT REPLACEMENT Right   ? knee  ? OSTOMY N/A 05/27/2021  ? Procedure: POSSIBLE OSTOMY;  Surgeon: Michael Boston, MD;  Location: [facility];  Service: General;  Laterality: N/A;  ? PORTACATH PLACEMENT N/A 10/02/2020  ? Procedure: PORT-A-CATH PLACEMENT;  Surgeon: Dwan Bolt, MD;  Location: [facility];  Service: General;  Laterality: N/A;  ? PROCTOSCOPY N/A 05/27/2021  ? Procedure: RIGID PROCTOSCOPY;  Surgeon: Michael Boston, MD;  Location: [facility];  Service: General;  Laterality: N/A;  ? XI ROBOTIC ASSISTED LOWER ANTERIOR RESECTION N/A 05/27/2021  ? Procedure: ROBOTIC LOW ANTERIOR COLON RESECTION, VERY LOW COLOANAL ANASTOMOSIS, DIVERTING ILEOSTOMY, BILATERAL TAP BLOCK,  FIRFLY INJECTION;  Surgeon: Michael Boston, MD;  Location: [facility];  Service: General;  Laterality: N/A;  ? ? ?Social History  ? ?Socioeconomic History  ? Marital status: Divorced  ?  Spouse name: Not on file  ? Number of children: Not on file  ? Years of education: Not on file  ? Highest education level: Not on file   ?Occupational History  ? Not on file  ?Tobacco Use  ? Smoking status: Former  ?  Packs/day: 0.50  ?  Years: 45.00  ?  Pack years: 22.50  ?  Types: Cigarettes  ?  Quit date: 04/22/2021  ?  Years since quitting: 0.1  ? Smokeless tobacco: Never  ?Vaping Use  ? Vaping Use: Never used  ?Substance and Sexual Activity  ? Alcohol use: No  ? Drug use: No  ? Sexual activity: Not on file  ?Other Topics Concern  ? Not on file  ?Social History Narrative  ? Not on file  ? ?Social Determinants of Health  ? ?Financial Resource Strain: Not on file  ?Food Insecurity: Not on file  ?Transportation Needs: Not on file  ?Physical Activity: Not on file  ?Stress: Not on file  ?Social Connections: Not on file  ?Intimate Partner Violence: Not on file  ? ?Family History  ?Problem Relation Age of Onset  ? ALS Mother   ? Heart disease Mother   ? Parkinson's disease Mother   ? Heart failure Father   ? ? ? ? ?VITAL SIGNS ?BP (!) 99/58   Pulse 75   Temp 98.6 ?F (37 ?C)   Resp 20   Ht 6' (1.829 m)   Wt 227 lb 9.6 oz (103.2 kg)   SpO2 90%  BMI 30.87 kg/m?  ? ?Outpatient Encounter Medications as of 06/25/2021  ?Medication Sig  ? acetaminophen (TYLENOL) 325 MG tablet Take 650 mg by mouth every 6 (six) hours as needed for mild pain.  ? albuterol (VENTOLIN HFA) 108 (90 Base) MCG/ACT inhaler Inhale 2 puffs into the lungs every 6 (six) hours as needed for wheezing or shortness of breath.  ? ALPRAZolam (XANAX) 1 MG tablet Take 1 tablet (1 mg total) by mouth 2 (two) times daily.  ? Amino Acids-Protein Hydrolys (FEEDING SUPPLEMENT, PRO-STAT SUGAR FREE 64,) LIQD Take 30 mLs by mouth 3 (three) times daily with meals. Special Instructions: for albumin 2.8  ? apixaban (ELIQUIS) 2.5 MG TABS tablet Take 2.5 mg by mouth 2 (two) times daily. Special Instructions: for d-dimer 2.81 ?[DX: 2019-nCoV acute respiratory disease-06/09/21]  ? aspirin EC 81 MG tablet Take 81 mg by mouth daily. Swallow whole.  ? Cholecalciferol (VITAMIN D3) 50 MCG (2000 UT) capsule Take  2,000 Units by mouth daily.  ? Heparin Sodium, Porcine, (HEPARIN LOCK FLUSH IJ) syringe; 10 unit/mL; amt: 10 units/mL; intravenous; Twice A Day ?Special Instructions: Flush port line after each use.  ? lidocaine-prilocaine (EMLA) cream Apply 1 application topically as directed. Apply 1/2 tablespoon to port-a-cath site 2 hours prior to stick and cover with plastic wrap to numb site  ? loperamide (IMODIUM) 2 MG capsule Take 2 mg by mouth every 8 (eight) hours as needed for diarrhea or loose stools.  ? loperamide (IMODIUM) 2 MG capsule Take 1 capsule (2 mg total) by mouth 2 (two) times daily.  ? magnesium oxide (MAG-OX) 400 (240 Mg) MG tablet TAKE ONE TABLET TWICE DAILY  ? mirtazapine (REMERON) 45 MG tablet Take 45 mg by mouth at bedtime.  ? Needle, Disp, (HUBER NEEDLE 22GX3/4") 22G X 3/4" MISC 22 x 3/4 "; miscellaneous,Once A Day on Mon ?Special Instructions: change weekly  ? NON FORMULARY Diet:  Dysphagia 3  with Thin Liquids  ? Nutritional Supplements (ENSURE ENLIVE PO) Take by mouth. 120 ml with medpass to protect muscle mass and help meet increased proten/energy needs ?Twice A Day Between Meals  ? omeprazole (PRILOSEC) 20 MG capsule Take 1 capsule (20 mg total) 2 (two) times daily by mouth.  ? ondansetron (ZOFRAN) 8 MG tablet Take 1 tablet (8 mg total) by mouth every 8 (eight) hours as needed for nausea or vomiting.  ? Ostomy Supplies (STOMAHESIVE PROTECTIVE) POWD (ostomy supplies) [OTC]  ?powder; - ; topical Every Shift - PRN ?Special Instructions: Apply to stoma periwound qshift prn irritation or excoriation.  ? polycarbophil (FIBERCON) 625 MG tablet Take 1 tablet (625 mg total) by mouth 2 (two) times daily.  ? potassium chloride SA (KLOR-CON M) 20 MEQ tablet TAKE ONE TABLET ONCE DAILY  ? pravastatin (PRAVACHOL) 40 MG tablet Take 40 mg by mouth daily.  ? propranolol (INNOPRAN XL) 80 MG 24 hr capsule Take 160 mg by mouth daily. Take 2 80 mg po one time daily  ? risperiDONE (RISPERDAL) 0.25 MG tablet Take 0.125 mg  by mouth at bedtime.  ? sodium chloride 0.9 % injection Special Instructions: Flush port-a-cath with N/S 66m before administering IV fluids. ?Once A Day  ? SUMAtriptan (IMITREX) 100 MG tablet Take 100 mg by mouth 2 (two) times daily as needed for migraine.  ? LOTENSIN HCT 20-12.5 MG per tablet TAKE (2) TABLETS DAILY (Patient not taking: Reported on 06/25/2021)  ? metFORMIN (GLUCOPHAGE) 500 MG tablet Take 1 tablet (500 mg total) by mouth 2 (two) times daily  with a meal. (Patient not taking: Reported on 06/25/2021)  ? prochlorperazine (COMPAZINE) 10 MG tablet Take 1 tablet (10 mg total) by mouth every 6 (six) hours as needed for nausea. (Patient not taking: Reported on 05/12/2021)  ? tamsulosin (FLOMAX) 0.4 MG CAPS capsule Take 1 capsule (0.4 mg total) by mouth daily after breakfast. (Patient not taking: Reported on 06/25/2021)  ? traMADol (ULTRAM) 50 MG tablet Take 1-2 tablets (50-100 mg total) by mouth every 6 (six) hours as needed for moderate pain. (Patient not taking: Reported on 06/19/2021)  ? ?No facility-administered encounter medications on file as of 06/25/2021.  ? ? ? ?SIGNIFICANT DIAGNOSTIC EXAMS ? ?PREVIOUS  ? ?04-01-21: 2-d echo:  ?1. There is severe asymmetric hypertrophy of the apical segments (up to  18 mm) consistent with apical variant hypertrophic cardiomyopathy. There  is concern for an apical aneurysm on this study, however this was a  difficult study. Would recommend cardiac  MRI for better characterization. Left ventricular ejection fraction, by  estimation, is 55 to 60%. The left ventricle has normal function. The left ventricle has no regional wall motion abnormalities. There is severe  asymmetric left ventricular  hypertrophy of the apical segment. Left ventricular diastolic parameters  are consistent with Grade I diastolic dysfunction (impaired relaxation).  ? ?05-29-21: renal ultrasound ?Redemonstration of right renal cyst without evidence of superimposed hydronephrosis. Greatest dimension  estimated 8.2 cm. ?Left renal cortex not well evaluated and is atrophic/replaced by chronic cystic change/hydronephrosis in the setting of chronic UPJ obstruction described on prior CT. ? ?NO NEW EXAMS.  ? ?LABS REVIEWED;  ?

## 2021-06-26 ENCOUNTER — Other Ambulatory Visit (HOSPITAL_COMMUNITY)
Admission: RE | Admit: 2021-06-26 | Discharge: 2021-06-26 | Disposition: A | Discharge status: Home / Self Care | Payer: Medicare Other | Source: Skilled Nursing Facility | Attending: Adult Health | Admitting: Adult Health

## 2021-06-26 ENCOUNTER — Non-Acute Institutional Stay (SKILLED_NURSING_FACILITY): Payer: Medicare Other | Admitting: Adult Health

## 2021-06-26 ENCOUNTER — Encounter: Payer: Self-pay | Admitting: Adult Health

## 2021-06-26 DIAGNOSIS — R198 Other specified symptoms and signs involving the digestive system and abdomen: Secondary | ICD-10-CM | POA: Diagnosis not present

## 2021-06-26 DIAGNOSIS — Z932 Ileostomy status: Secondary | ICD-10-CM

## 2021-06-26 DIAGNOSIS — N1832 Chronic kidney disease, stage 3b: Secondary | ICD-10-CM

## 2021-06-26 DIAGNOSIS — N179 Acute kidney failure, unspecified: Secondary | ICD-10-CM | POA: Diagnosis not present

## 2021-06-26 DIAGNOSIS — I129 Hypertensive chronic kidney disease with stage 1 through stage 4 chronic kidney disease, or unspecified chronic kidney disease: Secondary | ICD-10-CM | POA: Insufficient documentation

## 2021-06-26 LAB — COMPREHENSIVE METABOLIC PANEL
ALT: 18 U/L (ref 0–44)
AST: 16 U/L (ref 15–41)
Albumin: 2.6 g/dL — ABNORMAL LOW (ref 3.5–5.0)
Alkaline Phosphatase: 103 U/L (ref 38–126)
Anion gap: 7 (ref 5–15)
BUN: 38 mg/dL — ABNORMAL HIGH (ref 8–23)
CO2: 19 mmol/L — ABNORMAL LOW (ref 22–32)
Calcium: 8.4 mg/dL — ABNORMAL LOW (ref 8.9–10.3)
Chloride: 116 mmol/L — ABNORMAL HIGH (ref 98–111)
Creatinine, Ser: 2.49 mg/dL — ABNORMAL HIGH (ref 0.61–1.24)
GFR, Estimated: 28 mL/min — ABNORMAL LOW (ref >60)
Glucose, Bld: 116 mg/dL — ABNORMAL HIGH (ref 70–99)
Potassium: 4.7 mmol/L (ref 3.5–5.1)
Sodium: 142 mmol/L (ref 135–145)
Total Bilirubin: 0.7 mg/dL (ref 0.3–1.2)
Total Protein: 5.7 g/dL — ABNORMAL LOW (ref 6.5–8.1)

## 2021-06-26 NOTE — Progress Notes (Signed)
?Location:  [facility] ?Nursing Home Room Number: 122-W ?Place of Service:  SNF (31) ? ? ?CODE STATUS: Full Code ? ?Allergies  ?Allergen Reactions  ? Nsaids Other (See Comments)  ?  Chronic kidney disease - avoid NSAIDs  ? ? ?Chief Complaint  ?Patient presents with  ? Acute Visit  ?  Lab  ? ? ?HPI: ? ?His labs have returned his creat is 2.49; is slowly improving. He remains on IVF. Staff reports that he is restless at night; and is having some delusional thoughts. He has taken his IV a part. There are no reports of fevers present.  ? ?Past Medical History:  ?Diagnosis Date  ? Back pain   ? Bipolar disorder ([place])   ? bipolar 1  ? CKD (chronic kidney disease) stage 2, GFR 60-89 ml/min 09/24/2020  ? Hypertension   ? Knee pain   ? Migraine   ? Rectal cancer ([place])   ? Type 2 diabetes mellitus with complication, without long-term current use of insulin ([place]) 03/19/2021  ? ? ?Past Surgical History:  ?Procedure Laterality Date  ? BACK SURGERY    ? JOINT REPLACEMENT Right   ? knee  ? OSTOMY N/A 05/27/2021  ? Procedure: POSSIBLE OSTOMY;  Surgeon: Michael Boston, MD;  [facility]: [facility];  Service: General;  Laterality: N/A;  ? PORTACATH PLACEMENT N/A 10/02/2020  ? Procedure: PORT-A-CATH PLACEMENT;  Surgeon: Dwan Bolt, MD;  Location: [facility];  Service: General;  Laterality: N/A;  ? PROCTOSCOPY N/A 05/27/2021  ? Procedure: RIGID PROCTOSCOPY;  Surgeon: Michael Boston, MD;  Location: [facility];  Service: General;  Laterality: N/A;  ? XI ROBOTIC ASSISTED LOWER ANTERIOR RESECTION N/A 05/27/2021  ? Procedure: ROBOTIC LOW ANTERIOR COLON RESECTION, VERY LOW COLOANAL ANASTOMOSIS, DIVERTING ILEOSTOMY, BILATERAL TAP BLOCK,  FIRFLY INJECTION;  Surgeon: Michael Boston, MD;  Location: [facility];  Service: General;  Laterality: N/A;  ? ? ?Social History  ? ?Socioeconomic History  ? Marital status: Divorced  ?  Spouse name: Not on file  ? Number of children: Not on file  ? Years of education: Not on file  ? Highest education level: Not on file   ?Occupational History  ? Not on file  ?Tobacco Use  ? Smoking status: Former  ?  Packs/day: 0.50  ?  Years: 45.00  ?  Pack years: 22.50  ?  Types: Cigarettes  ?  Quit date: 04/22/2021  ?  Years since quitting: 0.1  ? Smokeless tobacco: Never  ?Vaping Use  ? Vaping Use: Never used  ?Substance and Sexual Activity  ? Alcohol use: No  ? Drug use: No  ? Sexual activity: Not on file  ?Other Topics Concern  ? Not on file  ?Social History Narrative  ? Not on file  ? ?Social Determinants of Health  ? ?Financial Resource Strain: Not on file  ?Food Insecurity: Not on file  ?Transportation Needs: Not on file  ?Physical Activity: Not on file  ?Stress: Not on file  ?Social Connections: Not on file  ?Intimate Partner Violence: Not on file  ? ?Family History  ?Problem Relation Age of Onset  ? ALS Mother   ? Heart disease Mother   ? Parkinson's disease Mother   ? Heart failure Father   ? ? ? ? ?VITAL SIGNS ?BP (!) 99/58   Pulse 75   Temp (!) 96.3 ?F (35.7 ?C)   Resp 20   Ht 6' (1.829 m)   Wt 227 lb 1.6 oz (103 kg)  SpO2 90%   BMI 30.80 kg/m?  ? ?Outpatient Encounter Medications as of 06/26/2021  ?Medication Sig  ? acetaminophen (TYLENOL) 325 MG tablet Take 650 mg by mouth every 6 (six) hours as needed for mild pain.  ? albuterol (VENTOLIN HFA) 108 (90 Base) MCG/ACT inhaler Inhale 2 puffs into the lungs every 6 (six) hours as needed for wheezing or shortness of breath.  ? ALPRAZolam (XANAX) 1 MG tablet Take 1 tablet (1 mg total) by mouth 2 (two) times daily.  ? Amino Acids-Protein Hydrolys (FEEDING SUPPLEMENT, PRO-STAT SUGAR FREE 64,) LIQD Take 30 mLs by mouth 3 (three) times daily with meals. Special Instructions: for albumin 2.8  ? apixaban (ELIQUIS) 2.5 MG TABS tablet Take 2.5 mg by mouth 2 (two) times daily. Special Instructions: for d-dimer 2.81 ?[DX: 2019-nCoV acute respiratory disease-06/09/21]  ? aspirin EC 81 MG tablet Take 81 mg by mouth daily. Swallow whole.  ? Cholecalciferol (VITAMIN D3) 50 MCG (2000 UT) capsule  Take 2,000 Units by mouth daily.  ? Heparin Sodium, Porcine, (HEPARIN LOCK FLUSH IJ) syringe; 10 unit/mL; amt: 10 units/mL; intravenous; Twice A Day ?Special Instructions: Flush port line after each use.  ? lidocaine-prilocaine (EMLA) cream Apply 1 application topically as directed. Apply 1/2 tablespoon to port-a-cath site 2 hours prior to stick and cover with plastic wrap to numb site  ? loperamide (IMODIUM) 2 MG capsule Take 2 mg by mouth every 8 (eight) hours as needed for diarrhea or loose stools.  ? loperamide (IMODIUM) 2 MG capsule Take 1 capsule (2 mg total) by mouth 2 (two) times daily.  ? magnesium oxide (MAG-OX) 400 (240 Mg) MG tablet TAKE ONE TABLET TWICE DAILY  ? melatonin 3 MG TABS tablet Take 3 mg by mouth at bedtime.  ? mirtazapine (REMERON) 45 MG tablet Take 45 mg by mouth at bedtime.  ? Needle, Disp, (HUBER NEEDLE 22GX3/4") 22G X 3/4" MISC 22 x 3/4 "; miscellaneous,Once A Day on Mon ?Special Instructions: change weekly  ? NON FORMULARY Diet:  Dysphagia 3  with Thin Liquids  ? Nutritional Supplements (ENSURE ENLIVE PO) Take by mouth. 120 ml with medpass to protect muscle mass and help meet increased proten/energy needs ?Twice A Day Between Meals  ? omeprazole (PRILOSEC) 20 MG capsule Take 1 capsule (20 mg total) 2 (two) times daily by mouth.  ? ondansetron (ZOFRAN) 8 MG tablet Take 1 tablet (8 mg total) by mouth every 8 (eight) hours as needed for nausea or vomiting.  ? Ostomy Supplies (STOMAHESIVE PROTECTIVE) POWD (ostomy supplies) [OTC]  ?powder; - ; topical Every Shift - PRN ?Special Instructions: Apply to stoma periwound qshift prn irritation or excoriation.  ? polycarbophil (FIBERCON) 625 MG tablet Take 1 tablet (625 mg total) by mouth 2 (two) times daily.  ? potassium chloride SA (KLOR-CON M) 20 MEQ tablet TAKE ONE TABLET ONCE DAILY  ? pravastatin (PRAVACHOL) 40 MG tablet Take 40 mg by mouth daily.  ? propranolol (INNOPRAN XL) 80 MG 24 hr capsule Take 160 mg by mouth daily. Take 2 80 mg po one  time daily  ? risperiDONE (RISPERDAL) 0.25 MG tablet Take 0.125 mg by mouth at bedtime.  ? sodium chloride 0.9 % injection Special Instructions: Flush port-a-cath with N/S 46m before administering IV fluids. ?Once A Day  ? SUMAtriptan (IMITREX) 100 MG tablet Take 100 mg by mouth 2 (two) times daily as needed for migraine.  ? LOTENSIN HCT 20-12.5 MG per tablet TAKE (2) TABLETS DAILY (Patient not taking: Reported on 06/25/2021)  ?  metFORMIN (GLUCOPHAGE) 500 MG tablet Take 1 tablet (500 mg total) by mouth 2 (two) times daily with a meal. (Patient not taking: Reported on 06/25/2021)  ? prochlorperazine (COMPAZINE) 10 MG tablet Take 1 tablet (10 mg total) by mouth every 6 (six) hours as needed for nausea. (Patient not taking: Reported on 05/12/2021)  ? tamsulosin (FLOMAX) 0.4 MG CAPS capsule Take 1 capsule (0.4 mg total) by mouth daily after breakfast. (Patient not taking: Reported on 06/25/2021)  ? traMADol (ULTRAM) 50 MG tablet Take 1-2 tablets (50-100 mg total) by mouth every 6 (six) hours as needed for moderate pain. (Patient not taking: Reported on 06/19/2021)  ? ?No facility-administered encounter medications on file as of 06/26/2021.  ? ? ? ?SIGNIFICANT DIAGNOSTIC EXAMS ? ?PREVIOUS  ? ?04-01-21: 2-d echo:  ?1. There is severe asymmetric hypertrophy of the apical segments (up to  18 mm) consistent with apical variant hypertrophic cardiomyopathy. There  is concern for an apical aneurysm on this study, however this was a  difficult study. Would recommend cardiac  MRI for better characterization. Left ventricular ejection fraction, by  estimation, is 55 to 60%. The left ventricle has normal function. The left ventricle has no regional wall motion abnormalities. There is severe  asymmetric left ventricular  hypertrophy of the apical segment. Left ventricular diastolic parameters  are consistent with Grade I diastolic dysfunction (impaired relaxation).  ? ?05-29-21: renal ultrasound ?Redemonstration of right renal cyst without  evidence of superimposed hydronephrosis. Greatest dimension estimated 8.2 cm. ?Left renal cortex not well evaluated and is atrophic/replaced by chronic cystic change/hydronephrosis in the setting of chronic UPJ

## 2021-06-29 ENCOUNTER — Non-Acute Institutional Stay (SKILLED_NURSING_FACILITY): Payer: Medicare Other | Admitting: Adult Health

## 2021-06-29 ENCOUNTER — Encounter: Payer: Self-pay | Admitting: Adult Health

## 2021-06-29 ENCOUNTER — Encounter (HOSPITAL_COMMUNITY)
Admission: RE | Admit: 2021-06-29 | Discharge: 2021-06-29 | Disposition: A | Discharge status: Home / Self Care | Payer: Medicare Other | Source: Skilled Nursing Facility | Attending: Adult Health | Admitting: Adult Health

## 2021-06-29 DIAGNOSIS — N1832 Chronic kidney disease, stage 3b: Secondary | ICD-10-CM

## 2021-06-29 DIAGNOSIS — R198 Other specified symptoms and signs involving the digestive system and abdomen: Secondary | ICD-10-CM | POA: Diagnosis not present

## 2021-06-29 DIAGNOSIS — Z932 Ileostomy status: Secondary | ICD-10-CM | POA: Diagnosis not present

## 2021-06-29 DIAGNOSIS — D6869 Other thrombophilia: Secondary | ICD-10-CM | POA: Insufficient documentation

## 2021-06-29 DIAGNOSIS — N179 Acute kidney failure, unspecified: Secondary | ICD-10-CM | POA: Diagnosis not present

## 2021-06-29 LAB — CBC WITH DIFFERENTIAL/PLATELET
Abs Immature Granulocytes: 0.03 10*3/uL (ref 0.00–0.07)
Basophils Absolute: 0 10*3/uL (ref 0.0–0.1)
Basophils Relative: 0 %
Eosinophils Absolute: 0.1 10*3/uL (ref 0.0–0.5)
Eosinophils Relative: 3 %
HCT: 24.9 % — ABNORMAL LOW (ref 39.0–52.0)
Hemoglobin: 7.9 g/dL — ABNORMAL LOW (ref 13.0–17.0)
Immature Granulocytes: 1 %
Lymphocytes Relative: 17 %
Lymphs Abs: 0.9 10*3/uL (ref 0.7–4.0)
MCH: 31.1 pg (ref 26.0–34.0)
MCHC: 31.7 g/dL (ref 30.0–36.0)
MCV: 98 fL (ref 80.0–100.0)
Monocytes Absolute: 0.4 10*3/uL (ref 0.1–1.0)
Monocytes Relative: 7 %
Neutro Abs: 3.5 10*3/uL (ref 1.7–7.7)
Neutrophils Relative %: 72 %
Platelets: 115 10*3/uL — ABNORMAL LOW (ref 150–400)
RBC: 2.54 MIL/uL — ABNORMAL LOW (ref 4.22–5.81)
RDW: 14.3 % (ref 11.5–15.5)
WBC: 4.9 10*3/uL (ref 4.0–10.5)
nRBC: 0 % (ref 0.0–0.2)

## 2021-06-29 LAB — BASIC METABOLIC PANEL
Anion gap: 4 — ABNORMAL LOW (ref 5–15)
BUN: 19 mg/dL (ref 8–23)
CO2: 22 mmol/L (ref 22–32)
Calcium: 7.8 mg/dL — ABNORMAL LOW (ref 8.9–10.3)
Chloride: 114 mmol/L — ABNORMAL HIGH (ref 98–111)
Creatinine, Ser: 1.57 mg/dL — ABNORMAL HIGH (ref 0.61–1.24)
GFR, Estimated: 48 mL/min — ABNORMAL LOW (ref >60)
Glucose, Bld: 150 mg/dL — ABNORMAL HIGH (ref 70–99)
Potassium: 3.8 mmol/L (ref 3.5–5.1)
Sodium: 140 mmol/L (ref 135–145)

## 2021-06-29 NOTE — Progress Notes (Signed)
?Location:  [place] ?Nursing Home Room Number: 50 W ?Place of Service:  SNF (31) ?Provider:  Ok Edwards, NP ? ? ?CODE STATUS: FULL CODE ? ?Allergies  ?Allergen Reactions  ? Nsaids Other (See Comments)  ?  Chronic kidney disease - avoid NSAIDs  ? ? ?Chief Complaint  ?Patient presents with  ? Acute Visit  ?  Labs  ? ? ?HPI: ? ?His renal function continues to improve. He is present receiving NS at 150 cc per hour. His creat is 1.57. his po intake is stable; his appetite is without change.  ? ?Past Medical History:  ?Diagnosis Date  ? Back pain   ? Bipolar disorder ([place])   ? bipolar 1  ? CKD (chronic kidney disease) stage 2, GFR 60-89 ml/min 09/24/2020  ? Hypertension   ? Knee pain   ? Migraine   ? Rectal cancer ([place])   ? Type 2 diabetes mellitus with complication, without long-term current use of insulin ([place]) 03/19/2021  ? ? ?Past Surgical History:  ?Procedure Laterality Date  ? BACK SURGERY    ? JOINT REPLACEMENT Right   ? knee  ? OSTOMY N/A 05/27/2021  ? Procedure: POSSIBLE OSTOMY;  Surgeon: Michael Boston, MD;  Location: [facility];  Service: General;  Laterality: N/A;  ? PORTACATH PLACEMENT N/A 10/02/2020  ? Procedure: PORT-A-CATH PLACEMENT;  Surgeon: Dwan Bolt, MD;  Location: [facility];  Service: General;  Laterality: N/A;  ? PROCTOSCOPY N/A 05/27/2021  ? Procedure: RIGID PROCTOSCOPY;  Surgeon: Michael Boston, MD;  Location: [facility];  Service: General;  Laterality: N/A;  ? XI ROBOTIC ASSISTED LOWER ANTERIOR RESECTION N/A 05/27/2021  ? Procedure: ROBOTIC LOW ANTERIOR COLON RESECTION, VERY LOW COLOANAL ANASTOMOSIS, DIVERTING ILEOSTOMY, BILATERAL TAP BLOCK,  FIRFLY INJECTION;  Surgeon: Michael Boston, MD;  Location: [facility];  Service: General;  Laterality: N/A;  ? ? ?Social History  ? ?Socioeconomic History  ? Marital status: Divorced  ?  Spouse name: Not on file  ? Number of children: Not on file  ? Years of education: Not on file  ? Highest education level: Not on file  ?Occupational History  ? Not on file   ?Tobacco Use  ? Smoking status: Former  ?  Packs/day: 0.50  ?  Years: 45.00  ?  Pack years: 22.50  ?  Types: Cigarettes  ?  Quit date: 04/22/2021  ?  Years since quitting: 0.1  ? Smokeless tobacco: Never  ?Vaping Use  ? Vaping Use: Never used  ?Substance and Sexual Activity  ? Alcohol use: No  ? Drug use: No  ? Sexual activity: Not on file  ?Other Topics Concern  ? Not on file  ?Social History Narrative  ? Not on file  ? ?Social Determinants of Health  ? ?Financial Resource Strain: Not on file  ?Food Insecurity: Not on file  ?Transportation Needs: Not on file  ?Physical Activity: Not on file  ?Stress: Not on file  ?Social Connections: Not on file  ?Intimate Partner Violence: Not on file  ? ?Family History  ?Problem Relation Age of Onset  ? ALS Mother   ? Heart disease Mother   ? Parkinson's disease Mother   ? Heart failure Father   ? ? ? ? ?VITAL SIGNS ?Temp (!) 97.1 ?F (36.2 ?C)   Ht 6' (1.829 m)   Wt 227 lb 1.6 oz (103 kg)   BMI 30.80 kg/m?  ? ?Outpatient Encounter Medications as of 06/29/2021  ?Medication Sig  ? acetaminophen (TYLENOL) 325  MG tablet Take 650 mg by mouth every 6 (six) hours as needed for mild pain.  ? albuterol (VENTOLIN HFA) 108 (90 Base) MCG/ACT inhaler Inhale 2 puffs into the lungs every 6 (six) hours as needed for wheezing or shortness of breath.  ? ALPRAZolam (XANAX) 1 MG tablet Take 1 tablet (1 mg total) by mouth 2 (two) times daily.  ? Amino Acids-Protein Hydrolys (FEEDING SUPPLEMENT, PRO-STAT SUGAR FREE 64,) LIQD Take 30 mLs by mouth 3 (three) times daily with meals. Special Instructions: for albumin 2.8  ? apixaban (ELIQUIS) 2.5 MG TABS tablet Take 2.5 mg by mouth 2 (two) times daily. Special Instructions: for d-dimer 2.81 ?[DX: 2019-nCoV acute respiratory disease-06/09/21]  ? aspirin EC 81 MG tablet Take 81 mg by mouth daily. Swallow whole.  ? Cholecalciferol (VITAMIN D3) 50 MCG (2000 UT) capsule Take 2,000 Units by mouth daily.  ? Heparin Sodium, Porcine, (HEPARIN LOCK FLUSH IJ)  syringe; 10 unit/mL; amt: 10 units/mL; intravenous; Twice A Day ?Special Instructions: Flush port line after each use.  ? lidocaine-prilocaine (EMLA) cream Apply 1 application topically as directed. Apply 1/2 tablespoon to port-a-cath site 2 hours prior to stick and cover with plastic wrap to numb site  ? loperamide (IMODIUM) 2 MG capsule Take 2 mg by mouth every 8 (eight) hours as needed for diarrhea or loose stools.  ? loperamide (IMODIUM) 2 MG capsule Take 1 capsule (2 mg total) by mouth 2 (two) times daily.  ? magnesium oxide (MAG-OX) 400 (240 Mg) MG tablet TAKE ONE TABLET TWICE DAILY  ? melatonin 3 MG TABS tablet Take 3 mg by mouth at bedtime.  ? mirtazapine (REMERON) 45 MG tablet Take 45 mg by mouth at bedtime.  ? Needle, Disp, (HUBER NEEDLE 22GX3/4") 22G X 3/4" MISC 22 x 3/4 "; miscellaneous,Once A Day on Mon ?Special Instructions: change weekly  ? NON FORMULARY Diet:  Dysphagia 3  with Thin Liquids  ? Nutritional Supplements (ENSURE ENLIVE PO) Take by mouth. 120 ml with medpass to protect muscle mass and help meet increased proten/energy needs ?Twice A Day Between Meals  ? omeprazole (PRILOSEC) 20 MG capsule Take 1 capsule (20 mg total) 2 (two) times daily by mouth.  ? ondansetron (ZOFRAN) 8 MG tablet Take 1 tablet (8 mg total) by mouth every 8 (eight) hours as needed for nausea or vomiting.  ? Ostomy Supplies (STOMAHESIVE PROTECTIVE) POWD (ostomy supplies) [OTC]  ?powder; - ; topical Every Shift - PRN ?Special Instructions: Apply to stoma periwound qshift prn irritation or excoriation.  ? polycarbophil (FIBERCON) 625 MG tablet Take 1 tablet (625 mg total) by mouth 2 (two) times daily.  ? potassium chloride SA (KLOR-CON M) 20 MEQ tablet TAKE ONE TABLET ONCE DAILY  ? pravastatin (PRAVACHOL) 40 MG tablet Take 40 mg by mouth daily.  ? propranolol (INNOPRAN XL) 80 MG 24 hr capsule Take 160 mg by mouth daily. Take 2 80 mg po one time daily  ? risperiDONE (RISPERDAL) 0.25 MG tablet Take 0.125 mg by mouth at  bedtime.  ? sodium chloride 0.9 % injection Special Instructions: Flush port-a-cath with N/S 26m before administering IV fluids. ?Once A Day  ? SUMAtriptan (IMITREX) 100 MG tablet Take 100 mg by mouth 2 (two) times daily as needed for migraine.  ? LOTENSIN HCT 20-12.5 MG per tablet TAKE (2) TABLETS DAILY (Patient not taking: Reported on 06/25/2021)  ? [DISCONTINUED] metFORMIN (GLUCOPHAGE) 500 MG tablet Take 1 tablet (500 mg total) by mouth 2 (two) times daily with a meal. (Patient  not taking: Reported on 06/25/2021)  ? [DISCONTINUED] prochlorperazine (COMPAZINE) 10 MG tablet Take 1 tablet (10 mg total) by mouth every 6 (six) hours as needed for nausea. (Patient not taking: Reported on 05/12/2021)  ? [DISCONTINUED] tamsulosin (FLOMAX) 0.4 MG CAPS capsule Take 1 capsule (0.4 mg total) by mouth daily after breakfast. (Patient not taking: Reported on 06/25/2021)  ? [DISCONTINUED] traMADol (ULTRAM) 50 MG tablet Take 1-2 tablets (50-100 mg total) by mouth every 6 (six) hours as needed for moderate pain. (Patient not taking: Reported on 06/19/2021)  ? ?No facility-administered encounter medications on file as of 06/29/2021.  ? ? ? ?SIGNIFICANT DIAGNOSTIC EXAMS ? ? ?PREVIOUS  ? ?04-01-21: 2-d echo:  ?1. There is severe asymmetric hypertrophy of the apical segments (up to  18 mm) consistent with apical variant hypertrophic cardiomyopathy. There  is concern for an apical aneurysm on this study, however this was a  difficult study. Would recommend cardiac  MRI for better characterization. Left ventricular ejection fraction, by  estimation, is 55 to 60%. The left ventricle has normal function. The left ventricle has no regional wall motion abnormalities. There is severe  asymmetric left ventricular  hypertrophy of the apical segment. Left ventricular diastolic parameters  are consistent with Grade I diastolic dysfunction (impaired relaxation).  ? ?05-29-21: renal ultrasound ?Redemonstration of right renal cyst without evidence of  superimposed hydronephrosis. Greatest dimension estimated 8.2 cm. ?Left renal cortex not well evaluated and is atrophic/replaced by chronic cystic change/hydronephrosis in the setting of chronic UPJ obstruction described

## 2021-06-30 ENCOUNTER — Inpatient Hospital Stay: Payer: Medicare Other

## 2021-06-30 ENCOUNTER — Inpatient Hospital Stay: Payer: Medicare Other | Admitting: Oncology

## 2021-06-30 ENCOUNTER — Other Ambulatory Visit (HOSPITAL_COMMUNITY)
Admission: RE | Admit: 2021-06-30 | Discharge: 2021-06-30 | Disposition: A | Discharge status: Home / Self Care | Payer: Medicare Other | Source: Skilled Nursing Facility | Attending: Adult Health | Admitting: Adult Health

## 2021-06-30 ENCOUNTER — Encounter: Payer: Self-pay | Admitting: Adult Health

## 2021-06-30 ENCOUNTER — Non-Acute Institutional Stay (SKILLED_NURSING_FACILITY): Payer: Medicare Other | Admitting: Adult Health

## 2021-06-30 DIAGNOSIS — N183 Chronic kidney disease, stage 3 unspecified: Secondary | ICD-10-CM | POA: Diagnosis not present

## 2021-06-30 DIAGNOSIS — R198 Other specified symptoms and signs involving the digestive system and abdomen: Secondary | ICD-10-CM | POA: Diagnosis not present

## 2021-06-30 DIAGNOSIS — D6869 Other thrombophilia: Secondary | ICD-10-CM | POA: Diagnosis not present

## 2021-06-30 DIAGNOSIS — U071 COVID-19: Secondary | ICD-10-CM

## 2021-06-30 DIAGNOSIS — E1122 Type 2 diabetes mellitus with diabetic chronic kidney disease: Secondary | ICD-10-CM

## 2021-06-30 DIAGNOSIS — Z932 Ileostomy status: Secondary | ICD-10-CM | POA: Diagnosis not present

## 2021-06-30 DIAGNOSIS — I129 Hypertensive chronic kidney disease with stage 1 through stage 4 chronic kidney disease, or unspecified chronic kidney disease: Secondary | ICD-10-CM | POA: Insufficient documentation

## 2021-06-30 LAB — HEMOGLOBIN AND HEMATOCRIT, BLOOD
HCT: 26.5 % — ABNORMAL LOW (ref 39.0–52.0)
Hemoglobin: 8.6 g/dL — ABNORMAL LOW (ref 13.0–17.0)

## 2021-06-30 LAB — BASIC METABOLIC PANEL
Anion gap: 3 — ABNORMAL LOW (ref 5–15)
BUN: 13 mg/dL (ref 8–23)
CO2: 23 mmol/L (ref 22–32)
Calcium: 7.8 mg/dL — ABNORMAL LOW (ref 8.9–10.3)
Chloride: 114 mmol/L — ABNORMAL HIGH (ref 98–111)
Creatinine, Ser: 1.45 mg/dL — ABNORMAL HIGH (ref 0.61–1.24)
GFR, Estimated: 53 mL/min — ABNORMAL LOW (ref >60)
Glucose, Bld: 102 mg/dL — ABNORMAL HIGH (ref 70–99)
Potassium: 3.6 mmol/L (ref 3.5–5.1)
Sodium: 140 mmol/L (ref 135–145)

## 2021-06-30 NOTE — Progress Notes (Signed)
?Location:  [place] ?Nursing Home Room Number: 122-W ?Place of Service:  SNF (31) ? ? ?CODE STATUS: Full Code  ?Allergies  ?Allergen Reactions  ? Nsaids Other (See Comments)  ?  Chronic kidney disease - avoid NSAIDs  ? ? ?Chief Complaint  ?Patient presents with  ? Acute Visit  ?  Follow-up on recent labs   ? ? ?HPI: ? ?His renal function labs continue to improve. His d-dimer remains elevated at 3.48 and remains on eliquis. There are no reports of nausea or vomiting. Stool remains without change.  ? ?Past Medical History:  ?Diagnosis Date  ? Back pain   ? Bipolar disorder ([place])   ? bipolar 1  ? CKD (chronic kidney disease) stage 2, GFR 60-89 ml/min 09/24/2020  ? Hypertension   ? Knee pain   ? Migraine   ? Rectal cancer ([place])   ? Type 2 diabetes mellitus with complication, without long-term current use of insulin ([place]) 03/19/2021  ? ? ?Past Surgical History:  ?Procedure Laterality Date  ? BACK SURGERY    ? JOINT REPLACEMENT Right   ? knee  ? OSTOMY N/A 05/27/2021  ? Procedure: POSSIBLE OSTOMY;  Surgeon: Michael Boston, MD;  [facility];  Service: General;  Laterality: N/A;  ? PORTACATH PLACEMENT N/A 10/02/2020  ? Procedure: PORT-A-CATH PLACEMENT;  Surgeon: Dwan Bolt, MD;  Location: [facility];  Service: General;  Laterality: N/A;  ? PROCTOSCOPY N/A 05/27/2021  ? Procedure: RIGID PROCTOSCOPY;  Surgeon: Michael Boston, MD;  [facility]: [facility];  Service: General;  Laterality: N/A;  ? XI ROBOTIC ASSISTED LOWER ANTERIOR RESECTION N/A 05/27/2021  ? Procedure: ROBOTIC LOW ANTERIOR COLON RESECTION, VERY LOW COLOANAL ANASTOMOSIS, DIVERTING ILEOSTOMY, BILATERAL TAP BLOCK,  FIRFLY INJECTION;  Surgeon: Michael Boston, MD;  Location: [facility];  Service: General;  Laterality: N/A;  ? ? ?Social History  ? ?Socioeconomic History  ? Marital status: Divorced  ?  Spouse name: Not on file  ? Number of children: Not on file  ? Years of education: Not on file  ? Highest education level: Not on file  ?Occupational History  ? Not on  file  ?Tobacco Use  ? Smoking status: Former  ?  Packs/day: 0.50  ?  Years: 45.00  ?  Pack years: 22.50  ?  Types: Cigarettes  ?  Quit date: 04/22/2021  ?  Years since quitting: 0.1  ? Smokeless tobacco: Never  ?Vaping Use  ? Vaping Use: Never used  ?Substance and Sexual Activity  ? Alcohol use: No  ? Drug use: No  ? Sexual activity: Not on file  ?Other Topics Concern  ? Not on file  ?Social History Narrative  ? Not on file  ? ?Social Determinants of Health  ? ?Financial Resource Strain: Not on file  ?Food Insecurity: Not on file  ?Transportation Needs: Not on file  ?Physical Activity: Not on file  ?Stress: Not on file  ?Social Connections: Not on file  ?Intimate Partner Violence: Not on file  ? ?Family History  ?Problem Relation Age of Onset  ? ALS Mother   ? Heart disease Mother   ? Parkinson's disease Mother   ? Heart failure Father   ? ? ? ? ?VITAL SIGNS ?BP (!) 99/58   Pulse 75   Temp (!) 97.1 ?F (36.2 ?C)   Resp 20   Ht 6' (1.829 m)   Wt 227 lb 1.6 oz (103 kg)   SpO2 90%   BMI 30.80 kg/m?  ? ?  Outpatient Encounter Medications as of 06/30/2021  ?Medication Sig  ? acetaminophen (TYLENOL) 325 MG tablet Take 650 mg by mouth every 6 (six) hours as needed for mild pain.  ? albuterol (VENTOLIN HFA) 108 (90 Base) MCG/ACT inhaler Inhale 2 puffs into the lungs every 6 (six) hours as needed for wheezing or shortness of breath.  ? ALPRAZolam (XANAX) 1 MG tablet Take 1 tablet (1 mg total) by mouth 2 (two) times daily.  ? Amino Acids-Protein Hydrolys (FEEDING SUPPLEMENT, PRO-STAT SUGAR FREE 64,) LIQD Take 30 mLs by mouth 3 (three) times daily with meals. Special Instructions: for albumin 2.8  ? apixaban (ELIQUIS) 2.5 MG TABS tablet Take 2.5 mg by mouth 2 (two) times daily. Special Instructions: for d-dimer 2.81 ?[DX: 2019-nCoV acute respiratory disease-06/09/21]  ? aspirin EC 81 MG tablet Take 81 mg by mouth daily. Swallow whole.  ? Cholecalciferol (VITAMIN D3) 50 MCG (2000 UT) capsule Take 2,000 Units by mouth daily.  ?  Heparin Sodium, Porcine, (HEPARIN LOCK FLUSH IJ) syringe; 10 unit/mL; amt: 10 units/mL; intravenous; Twice A Day ?Special Instructions: Flush port line after each use.  ? lidocaine-prilocaine (EMLA) cream Apply 1 application topically as directed. Apply 1/2 tablespoon to port-a-cath site 2 hours prior to stick and cover with plastic wrap to numb site  ? loperamide (IMODIUM) 2 MG capsule Take 2 mg by mouth every 8 (eight) hours as needed for diarrhea or loose stools.  ? loperamide (IMODIUM) 2 MG capsule Take 1 capsule (2 mg total) by mouth 2 (two) times daily.  ? magnesium oxide (MAG-OX) 400 (240 Mg) MG tablet TAKE ONE TABLET TWICE DAILY  ? melatonin 3 MG TABS tablet Take 3 mg by mouth at bedtime.  ? mirtazapine (REMERON) 45 MG tablet Take 45 mg by mouth at bedtime.  ? Needle, Disp, (HUBER NEEDLE 22GX3/4") 22G X 3/4" MISC 22 x 3/4 "; miscellaneous,Once A Day on Mon ?Special Instructions: change weekly  ? NON FORMULARY Diet:  Dysphagia 3  with Thin Liquids  ? Nutritional Supplements (ENSURE ENLIVE PO) Take by mouth. 120 ml with medpass to protect muscle mass and help meet increased proten/energy needs ?Twice A Day Between Meals  ? omeprazole (PRILOSEC) 20 MG capsule Take 1 capsule (20 mg total) 2 (two) times daily by mouth.  ? ondansetron (ZOFRAN) 8 MG tablet Take 1 tablet (8 mg total) by mouth every 8 (eight) hours as needed for nausea or vomiting.  ? Ostomy Supplies (STOMAHESIVE PROTECTIVE) POWD (ostomy supplies) [OTC]  ?powder; - ; topical Every Shift - PRN ?Special Instructions: Apply to stoma periwound qshift prn irritation or excoriation.  ? polycarbophil (FIBERCON) 625 MG tablet Take 1 tablet (625 mg total) by mouth 2 (two) times daily.  ? potassium chloride SA (KLOR-CON M) 20 MEQ tablet TAKE ONE TABLET ONCE DAILY  ? pravastatin (PRAVACHOL) 40 MG tablet Take 40 mg by mouth daily.  ? propranolol (INNOPRAN XL) 80 MG 24 hr capsule Take 160 mg by mouth daily.  ? risperiDONE (RISPERDAL) 0.25 MG tablet Take 0.125 mg  by mouth at bedtime.  ? sodium chloride 0.9 % injection Special Instructions: Flush port-a-cath with N/S 49m before administering IV fluids. ?Twice daily  ? SUMAtriptan (IMITREX) 100 MG tablet Take 100 mg by mouth 2 (two) times daily as needed for migraine.  ? [DISCONTINUED] LOTENSIN HCT 20-12.5 MG per tablet TAKE (2) TABLETS DAILY (Patient not taking: Reported on 06/25/2021)  ? ?No facility-administered encounter medications on file as of 06/30/2021.  ? ? ? ?SIGNIFICANT DIAGNOSTIC EXAMS ? ?  PREVIOUS  ? ?04-01-21: 2-d echo:  ?1. There is severe asymmetric hypertrophy of the apical segments (up to  18 mm) consistent with apical variant hypertrophic cardiomyopathy. There  is concern for an apical aneurysm on this study, however this was a  difficult study. Would recommend cardiac  MRI for better characterization. Left ventricular ejection fraction, by  estimation, is 55 to 60%. The left ventricle has normal function. The left ventricle has no regional wall motion abnormalities. There is severe  asymmetric left ventricular  hypertrophy of the apical segment. Left ventricular diastolic parameters  are consistent with Grade I diastolic dysfunction (impaired relaxation).  ? ?05-29-21: renal ultrasound ?Redemonstration of right renal cyst without evidence of superimposed hydronephrosis. Greatest dimension estimated 8.2 cm. ?Left renal cortex not well evaluated and is atrophic/replaced by chronic cystic change/hydronephrosis in the setting of chronic UPJ obstruction described on prior CT. ? ?NO NEW EXAMS.  ? ?LABS REVIEWED;  ? ?05-14-21: hgb a1c 7.2 ?05-28-21: wbc 9.7; hgb 13.5; hct 38.8; mcv 97.5 plt 124; glucose 193; bun 28; creat 1.74; k+ 4.4; na++ 134; ca 7.8; GFR 43; protein 5.8; albumin 3.1; mag 1.5 ?06-01-21: glucose 134; bun 28; creat 1.69; k+ 3.7; na++ 136; ca 7.7; GFR 44 ?06-09-21: wbc 7.4; hgb 8.5; hct 26.8; mcv 100.8 plt 399; glucose 145; bun 19; creat 1.43; k+ 3.8; na++ 135; ca 8.3; GFR 54; crp 10.7; d-dimer 7.64   ? ?TODAY ? ?06-15-21: mag 2.0 ?06-18-21: CRP 1.0; d-dimer 2.81  ?06-21-21: glucose 159; bun 72; creat 5.93; k+ 5.9; na++ 130; ca 8.9; GFR 10 ?06-22-21: wbc 6.6; hgb 10.2; hct 31.8; mcv 97.8 plt 175; glucose 165; bun 7

## 2021-07-01 ENCOUNTER — Encounter: Payer: Self-pay | Admitting: Adult Health

## 2021-07-01 ENCOUNTER — Non-Acute Institutional Stay (SKILLED_NURSING_FACILITY): Payer: Medicare Other | Admitting: Adult Health

## 2021-07-01 ENCOUNTER — Other Ambulatory Visit (HOSPITAL_COMMUNITY)
Admission: RE | Admit: 2021-07-01 | Discharge: 2021-07-01 | Disposition: A | Discharge status: Home / Self Care | Payer: Medicare Other | Source: Skilled Nursing Facility | Attending: Adult Health | Admitting: Adult Health

## 2021-07-01 ENCOUNTER — Encounter (HOSPITAL_COMMUNITY)
Admission: RE | Admit: 2021-07-01 | Discharge: 2021-07-01 | Disposition: A | Discharge status: Home / Self Care | Payer: Medicare Other | Source: Skilled Nursing Facility | Attending: *Deleted | Admitting: *Deleted

## 2021-07-01 DIAGNOSIS — R338 Other retention of urine: Secondary | ICD-10-CM | POA: Insufficient documentation

## 2021-07-01 DIAGNOSIS — U071 COVID-19: Secondary | ICD-10-CM

## 2021-07-01 DIAGNOSIS — N183 Chronic kidney disease, stage 3 unspecified: Secondary | ICD-10-CM

## 2021-07-01 DIAGNOSIS — D6869 Other thrombophilia: Secondary | ICD-10-CM | POA: Diagnosis not present

## 2021-07-01 DIAGNOSIS — I129 Hypertensive chronic kidney disease with stage 1 through stage 4 chronic kidney disease, or unspecified chronic kidney disease: Secondary | ICD-10-CM | POA: Insufficient documentation

## 2021-07-01 DIAGNOSIS — E1122 Type 2 diabetes mellitus with diabetic chronic kidney disease: Secondary | ICD-10-CM

## 2021-07-01 LAB — CBC
HCT: 25.8 % — ABNORMAL LOW (ref 39.0–52.0)
Hemoglobin: 8.2 g/dL — ABNORMAL LOW (ref 13.0–17.0)
MCH: 31.1 pg (ref 26.0–34.0)
MCHC: 31.8 g/dL (ref 30.0–36.0)
MCV: 97.7 fL (ref 80.0–100.0)
Platelets: 93 10*3/uL — ABNORMAL LOW (ref 150–400)
RBC: 2.64 MIL/uL — ABNORMAL LOW (ref 4.22–5.81)
RDW: 14.2 % (ref 11.5–15.5)
WBC: 4.4 10*3/uL (ref 4.0–10.5)
nRBC: 0 % (ref 0.0–0.2)

## 2021-07-01 LAB — D-DIMER, QUANTITATIVE: D-Dimer, Quant: 3.48 ug/mL-FEU — ABNORMAL HIGH (ref 0.00–0.50)

## 2021-07-01 LAB — BASIC METABOLIC PANEL
Anion gap: 5 (ref 5–15)
BUN: 9 mg/dL (ref 8–23)
CO2: 23 mmol/L (ref 22–32)
Calcium: 7.5 mg/dL — ABNORMAL LOW (ref 8.9–10.3)
Chloride: 112 mmol/L — ABNORMAL HIGH (ref 98–111)
Creatinine, Ser: 1.32 mg/dL — ABNORMAL HIGH (ref 0.61–1.24)
GFR, Estimated: 59 mL/min — ABNORMAL LOW (ref >60)
Glucose, Bld: 115 mg/dL — ABNORMAL HIGH (ref 70–99)
Potassium: 3.8 mmol/L (ref 3.5–5.1)
Sodium: 140 mmol/L (ref 135–145)

## 2021-07-01 NOTE — Progress Notes (Signed)
?Location:  [facility] ?[facility]ursing Home Room Number: M/122/[id number] ?Place of Service:  SNF (31) ? ? ?CODE STATUS: FULL ? ?Allergies  ?Allergen Reactions  ? Nsaids Other (See Comments)  ?  Chronic kidney disease - avoid NSAIDs  ? ? ?Chief Complaint  ?Patient presents with  ? Acute Visit  ?  Patient is being seen today for lab results  ? ? ?HPI: ? ?His renal labs continue to improve. He is tolerating ivf without difficulty. He continues to eliquis due to elevated d-dimer from covid 19. He is ready to come off 24 hours of fluids. He will still require ivf at night.  ? ?Past Medical History:  ?Diagnosis Date  ? Back pain   ? Bipolar disorder ([place])   ? bipolar 1  ? CKD (chronic kidney disease) stage 2, GFR 60-89 ml/min 09/24/2020  ? Hypertension   ? Knee pain   ? Migraine   ? Rectal cancer ([place])   ? Type 2 diabetes mellitus with complication, without long-term current use of insulin ([place]) 03/19/2021  ? ? ?Past Surgical History:  ?Procedure Laterality Date  ? BACK SURGERY    ? JOINT REPLACEMENT Right   ? knee  ? OSTOMY N/A 05/27/2021  ? Procedure: POSSIBLE OSTOMY;  Surgeon: Michael Boston, MD;  [facility]: [facility];  Service: General;  Laterality: N/A;  ? PORTACATH PLACEMENT N/A 10/02/2020  ? Procedure: PORT-A-CATH PLACEMENT;  Surgeon: Dwan Bolt, MD;  Location: [facility];  Service: General;  Laterality: N/A;  ? PROCTOSCOPY N/A 05/27/2021  ? Procedure: RIGID PROCTOSCOPY;  Surgeon: Michael Boston, MD;  [facility]: [facility];  Service: General;  Laterality: N/A;  ? XI ROBOTIC ASSISTED LOWER ANTERIOR RESECTION N/A 05/27/2021  ? Procedure: ROBOTIC LOW ANTERIOR COLON RESECTION, VERY LOW COLOANAL ANASTOMOSIS, DIVERTING ILEOSTOMY, BILATERAL TAP BLOCK,  FIRFLY INJECTION;  Surgeon: Michael Boston, MD;  [facility];  Service: General;  Laterality: N/A;  ? ? ?Social History  ? ?Socioeconomic History  ? Marital status: Divorced  ?  Spouse name: Not on file  ? Number of children: Not on file  ? Years of education: Not on file  ? Highest  education level: Not on file  ?Occupational History  ? Not on file  ?Tobacco Use  ? Smoking status: Former  ?  Packs/day: 0.50  ?  Years: 45.00  ?  Pack years: 22.50  ?  Types: Cigarettes  ?  Quit date: 04/22/2021  ?  Years since quitting: 0.1  ? Smokeless tobacco: Never  ?Vaping Use  ? Vaping Use: Never used  ?Substance and Sexual Activity  ? Alcohol use: No  ? Drug use: No  ? Sexual activity: Not on file  ?Other Topics Concern  ? Not on file  ?Social History Narrative  ? Not on file  ? ?Social Determinants of Health  ? ?Financial Resource Strain: Not on file  ?Food Insecurity: Not on file  ?Transportation Needs: Not on file  ?Physical Activity: Not on file  ?Stress: Not on file  ?Social Connections: Not on file  ?Intimate Partner Violence: Not on file  ? ?Family History  ?Problem Relation Age of Onset  ? ALS Mother   ? Heart disease Mother   ? Parkinson's disease Mother   ? Heart failure Father   ? ? ? ? ?VITAL SIGNS ?BP (!) 99/58   Pulse 75   Temp (!) 97.2 ?F (36.2 ?C)   Resp 20   Wt 227 lb (103 kg)   SpO2 90%  BMI 30.79 kg/m?  ? ?Outpatient Encounter Medications as of 07/01/2021  ?Medication Sig  ? acetaminophen (TYLENOL) 325 MG tablet Take 650 mg by mouth every 6 (six) hours as needed for mild pain.  ? albuterol (VENTOLIN HFA) 108 (90 Base) MCG/ACT inhaler Inhale 2 puffs into the lungs every 6 (six) hours as needed for wheezing or shortness of breath.  ? ALPRAZolam (XANAX) 1 MG tablet Take 1 tablet (1 mg total) by mouth 2 (two) times daily.  ? Amino Acids-Protein Hydrolys (FEEDING SUPPLEMENT, PRO-STAT SUGAR FREE 64,) LIQD Take 30 mLs by mouth 3 (three) times daily with meals. Special Instructions: for albumin 2.8  ? apixaban (ELIQUIS) 2.5 MG TABS tablet Take 2.5 mg by mouth 2 (two) times daily. Special Instructions: for d-dimer 2.81 ?[DX: 2019-nCoV acute respiratory disease-06/09/21]  ? aspirin EC 81 MG tablet Take 81 mg by mouth daily. Swallow whole.  ? Cholecalciferol (VITAMIN D3) 50 MCG (2000 UT) capsule  Take 2,000 Units by mouth daily.  ? Heparin Sodium, Porcine, (HEPARIN LOCK FLUSH IJ) syringe; 10 unit/mL; amt: 10 units/mL; intravenous; Twice A Day ?Special Instructions: Flush port line after each use.  ? lidocaine-prilocaine (EMLA) cream Apply 1 application topically as directed. Apply 1/2 tablespoon to port-a-cath site 2 hours prior to stick and cover with plastic wrap to numb site  ? loperamide (IMODIUM) 2 MG capsule Take 2 mg by mouth every 8 (eight) hours as needed for diarrhea or loose stools.  ? loperamide (IMODIUM) 2 MG capsule Take 1 capsule (2 mg total) by mouth 2 (two) times daily.  ? magnesium oxide (MAG-OX) 400 (240 Mg) MG tablet TAKE ONE TABLET TWICE DAILY  ? melatonin 3 MG TABS tablet Take 3 mg by mouth at bedtime.  ? mirtazapine (REMERON) 45 MG tablet Take 45 mg by mouth at bedtime.  ? Needle, Disp, (HUBER NEEDLE 22GX3/4") 22G X 3/4" MISC 22 x 3/4 "; miscellaneous,Once A Day on Mon ?Special Instructions: change weekly  ? NON FORMULARY Diet:  Dysphagia 3  with Thin Liquids  ? Nutritional Supplements (ENSURE ENLIVE PO) Take by mouth. 120 ml with medpass to protect muscle mass and help meet increased proten/energy needs ?Twice A Day Between Meals  ? omeprazole (PRILOSEC) 20 MG capsule Take 1 capsule (20 mg total) 2 (two) times daily by mouth.  ? ondansetron (ZOFRAN) 8 MG tablet Take 1 tablet (8 mg total) by mouth every 8 (eight) hours as needed for nausea or vomiting.  ? Ostomy Supplies (STOMAHESIVE PROTECTIVE) POWD (ostomy supplies) [OTC]  ?powder; - ; topical Every Shift - PRN ?Special Instructions: Apply to stoma periwound qshift prn irritation or excoriation.  ? polycarbophil (FIBERCON) 625 MG tablet Take 1 tablet (625 mg total) by mouth 2 (two) times daily.  ? potassium chloride SA (KLOR-CON M) 20 MEQ tablet TAKE ONE TABLET ONCE DAILY  ? pravastatin (PRAVACHOL) 40 MG tablet Take 40 mg by mouth daily.  ? propranolol (INNOPRAN XL) 80 MG 24 hr capsule Take 160 mg by mouth daily.  ? risperiDONE  (RISPERDAL) 0.25 MG tablet Take 0.125 mg by mouth at bedtime.  ? sodium chloride 0.9 % injection Special Instructions: Flush port-a-cath with N/S 25m before administering IV fluids. ?Twice daily  ? SUMAtriptan (IMITREX) 100 MG tablet Take 100 mg by mouth 2 (two) times daily as needed for migraine.  ? ?No facility-administered encounter medications on file as of 07/01/2021.  ? ? ? ?SIGNIFICANT DIAGNOSTIC EXAMS ? ? ?PREVIOUS  ? ?04-01-21: 2-d echo:  ?1. There is severe asymmetric  hypertrophy of the apical segments (up to  18 mm) consistent with apical variant hypertrophic cardiomyopathy. There  is concern for an apical aneurysm on this study, however this was a  difficult study. Would recommend cardiac  MRI for better characterization. Left ventricular ejection fraction, by  estimation, is 55 to 60%. The left ventricle has normal function. The left ventricle has no regional wall motion abnormalities. There is severe  asymmetric left ventricular  hypertrophy of the apical segment. Left ventricular diastolic parameters  are consistent with Grade I diastolic dysfunction (impaired relaxation).  ? ?05-29-21: renal ultrasound ?Redemonstration of right renal cyst without evidence of superimposed hydronephrosis. Greatest dimension estimated 8.2 cm. ?Left renal cortex not well evaluated and is atrophic/replaced by chronic cystic change/hydronephrosis in the setting of chronic UPJ obstruction described on prior CT. ? ?NO NEW EXAMS.  ? ?LABS REVIEWED;  ? ?05-14-21: hgb a1c 7.2 ?05-28-21: wbc 9.7; hgb 13.5; hct 38.8; mcv 97.5 plt 124; glucose 193; bun 28; creat 1.74; k+ 4.4; na++ 134; ca 7.8; GFR 43; protein 5.8; albumin 3.1; mag 1.5 ?06-01-21: glucose 134; bun 28; creat 1.69; k+ 3.7; na++ 136; ca 7.7; GFR 44 ?06-09-21: wbc 7.4; hgb 8.5; hct 26.8; mcv 100.8 plt 399; glucose 145; bun 19; creat 1.43; k+ 3.8; na++ 135; ca 8.3; GFR 54; crp 10.7; d-dimer 7.64  ? ?TODAY ? ?06-15-21: mag 2.0 ?06-18-21: CRP 1.0; d-dimer 2.81  ?06-21-21: glucose  159; bun 72; creat 5.93; k+ 5.9; na++ 130; ca 8.9; GFR 10 ?06-22-21: wbc 6.6; hgb 10.2; hct 31.8; mcv 97.8 plt 175; glucose 165; bun 70; creat 5.39; k+ 5.8; na++ 132; ca 8.7; GFR 11 d-dimer 1.77  ?06-23-21: glucose 191; bu

## 2021-07-02 ENCOUNTER — Encounter: Payer: Self-pay | Admitting: Adult Health

## 2021-07-02 ENCOUNTER — Non-Acute Institutional Stay (SKILLED_NURSING_FACILITY): Payer: Medicare Other | Admitting: Adult Health

## 2021-07-02 DIAGNOSIS — R310 Gross hematuria: Secondary | ICD-10-CM

## 2021-07-02 DIAGNOSIS — D6869 Other thrombophilia: Secondary | ICD-10-CM

## 2021-07-02 DIAGNOSIS — U071 COVID-19: Secondary | ICD-10-CM

## 2021-07-02 LAB — HEPATIC FUNCTION PANEL
ALT: 15 U/L (ref 0–44)
AST: 13 U/L — ABNORMAL LOW (ref 15–41)
Albumin: 2.5 g/dL — ABNORMAL LOW (ref 3.5–5.0)
Alkaline Phosphatase: 95 U/L (ref 38–126)
Bilirubin, Direct: 0.2 mg/dL (ref 0.0–0.2)
Indirect Bilirubin: 0.2 mg/dL — ABNORMAL LOW (ref 0.3–0.9)
Total Bilirubin: 0.4 mg/dL (ref 0.3–1.2)
Total Protein: 5.9 g/dL — ABNORMAL LOW (ref 6.5–8.1)

## 2021-07-02 LAB — URINALYSIS, ROUTINE W REFLEX MICROSCOPIC
Bilirubin Urine: NEGATIVE
Glucose, UA: NEGATIVE mg/dL
Ketones, ur: NEGATIVE mg/dL
Nitrite: NEGATIVE
Protein, ur: 100 mg/dL — AB
RBC / HPF: >50 RBC/hpf — ABNORMAL HIGH (ref 0–5)
Specific Gravity, Urine: 1.004 — ABNORMAL LOW (ref 1.005–1.030)
WBC, UA: >50 WBC/hpf — ABNORMAL HIGH (ref 0–5)
pH: 6 (ref 5.0–8.0)

## 2021-07-02 NOTE — Progress Notes (Signed)
?Location:  [facility] ?Nursing Home Room Number: 124-P ?Place of Service:  SNF (31) ? ? ?CODE STATUS: Full Code ? ?Allergies  ?Allergen Reactions  ? Nsaids Other (See Comments)  ?  Chronic kidney disease - avoid NSAIDs  ? ? ?Chief Complaint  ?Patient presents with  ? Acute Visit  ?  Hematuria   ? ? ?HPI: ? ?He has hematuria today. He has had a urine culture sent results are pending. He is presently taking low dose eliquis for elevated d-dimer associated with covid 19. There are no reports of blood in his stool. He denies any bladder pain; no back pain. No reports of fevers are present.  ? ?Past Medical History:  ?Diagnosis Date  ? Back pain   ? Bipolar disorder ([place])   ? bipolar 1  ? CKD (chronic kidney disease) stage 2, GFR 60-89 ml/min 09/24/2020  ? Hypertension   ? Knee pain   ? Migraine   ? Rectal cancer ([place])   ? Type 2 diabetes mellitus with complication, without long-term current use of insulin ([place]) 03/19/2021  ? ? ?Past Surgical History:  ?Procedure Laterality Date  ? BACK SURGERY    ? JOINT REPLACEMENT Right   ? knee  ? OSTOMY N/A 05/27/2021  ? Procedure: POSSIBLE OSTOMY;  Surgeon: Michael Boston, MD;  [facility];  Service: General;  Laterality: N/A;  ? PORTACATH PLACEMENT N/A 10/02/2020  ? Procedure: PORT-A-CATH PLACEMENT;  Surgeon: Dwan Bolt, MD;  [facility];  Service: General;  Laterality: N/A;  ? PROCTOSCOPY N/A 05/27/2021  ? Procedure: RIGID PROCTOSCOPY;  Surgeon: Michael Boston, MD;  [facility];  Service: General;  Laterality: N/A;  ? XI ROBOTIC ASSISTED LOWER ANTERIOR RESECTION N/A 05/27/2021  ? Procedure: ROBOTIC LOW ANTERIOR COLON RESECTION, VERY LOW COLOANAL ANASTOMOSIS, DIVERTING ILEOSTOMY, BILATERAL TAP BLOCK,  FIRFLY INJECTION;  Surgeon: Michael Boston, MD;  [facility];  Service: General;  Laterality: N/A;  ? ? ?Social History  ? ?Socioeconomic History  ? Marital status: Divorced  ?  Spouse name: Not on file  ? Number of children: Not on file  ? Years of  education: Not on file  ? Highest education level: Not on file  ?Occupational History  ? Not on file  ?Tobacco Use  ? Smoking status: Former  ?  Packs/day: 0.50  ?  Years: 45.00  ?  Pack years: 22.50  ?  Types: Cigarettes  ?  Quit date: 04/22/2021  ?  Years since quitting: 0.1  ? Smokeless tobacco: Never  ?Vaping Use  ? Vaping Use: Never used  ?Substance and Sexual Activity  ? Alcohol use: No  ? Drug use: No  ? Sexual activity: Not on file  ?Other Topics Concern  ? Not on file  ?Social History Narrative  ? Not on file  ? ?Social Determinants of Health  ? ?Financial Resource Strain: Not on file  ?Food Insecurity: Not on file  ?Transportation Needs: Not on file  ?Physical Activity: Not on file  ?Stress: Not on file  ?Social Connections: Not on file  ?Intimate Partner Violence: Not on file  ? ?Family History  ?Problem Relation Age of Onset  ? ALS Mother   ? Heart disease Mother   ? Parkinson's disease Mother   ? Heart failure Father   ? ? ? ? ?VITAL SIGNS ?BP 104/69   Pulse 96   Temp (!) 96.5 ?F (35.8 ?C)   Resp 20   Wt 227 lb 1.6 oz (103  kg)   SpO2 90%   BMI 30.80 kg/m?  ? ?Outpatient Encounter Medications as of 07/02/2021  ?Medication Sig  ? acetaminophen (TYLENOL) 325 MG tablet Take 650 mg by mouth every 6 (six) hours as needed for mild pain.  ? albuterol (VENTOLIN HFA) 108 (90 Base) MCG/ACT inhaler Inhale 2 puffs into the lungs every 6 (six) hours as needed for wheezing or shortness of breath.  ? ALPRAZolam (XANAX) 1 MG tablet Take 1 tablet (1 mg total) by mouth 2 (two) times daily.  ? Amino Acids-Protein Hydrolys (FEEDING SUPPLEMENT, PRO-STAT SUGAR FREE 64,) LIQD Take 30 mLs by mouth 3 (three) times daily with meals. Special Instructions: for albumin 2.8  ? aspirin EC 81 MG tablet Take 81 mg by mouth daily. Swallow whole.  ? Cholecalciferol (VITAMIN D3) 50 MCG (2000 UT) capsule Take 2,000 Units by mouth daily.  ? Heparin Sodium, Porcine, (HEPARIN LOCK FLUSH IJ) syringe; 10 unit/mL; amt: 10 units/mL;  intravenous; Twice A Day ?Special Instructions: Flush port line after each use.  ? lidocaine-prilocaine (EMLA) cream Apply 1 application topically as directed. Apply 1/2 tablespoon to port-a-cath site 2 hours prior to stick and cover with plastic wrap to numb site  ? loperamide (IMODIUM) 2 MG capsule Take 2 mg by mouth every 8 (eight) hours as needed for diarrhea or loose stools.  ? loperamide (IMODIUM) 2 MG capsule Take 1 capsule (2 mg total) by mouth 2 (two) times daily.  ? magnesium oxide (MAG-OX) 400 (240 Mg) MG tablet TAKE ONE TABLET TWICE DAILY  ? melatonin 3 MG TABS tablet Take 3 mg by mouth at bedtime.  ? mirtazapine (REMERON) 45 MG tablet Take 45 mg by mouth at bedtime.  ? Needle, Disp, (HUBER NEEDLE 22GX3/4") 22G X 3/4" MISC 22 x 3/4 "; miscellaneous,Once A Day on Mon ?Special Instructions: change weekly  ? NON FORMULARY Diet:  Dysphagia 3  with Thin Liquids  ? Nutritional Supplements (ENSURE ENLIVE PO) Take by mouth. 120 ml with medpass to protect muscle mass and help meet increased proten/energy needs ?Twice A Day Between Meals  ? omeprazole (PRILOSEC) 20 MG capsule Take 1 capsule (20 mg total) 2 (two) times daily by mouth.  ? ondansetron (ZOFRAN) 8 MG tablet Take 1 tablet (8 mg total) by mouth every 8 (eight) hours as needed for nausea or vomiting.  ? Ostomy Supplies (STOMAHESIVE PROTECTIVE) POWD (ostomy supplies) [OTC]  ?powder; - ; topical Every Shift - PRN ?Special Instructions: Apply to stoma periwound qshift prn irritation or excoriation.  ? polycarbophil (FIBERCON) 625 MG tablet Take 1 tablet (625 mg total) by mouth 2 (two) times daily.  ? potassium chloride SA (KLOR-CON M) 20 MEQ tablet TAKE ONE TABLET ONCE DAILY  ? pravastatin (PRAVACHOL) 40 MG tablet Take 40 mg by mouth daily.  ? propranolol (INNOPRAN XL) 80 MG 24 hr capsule Take 160 mg by mouth daily.  ? risperiDONE (RISPERDAL) 0.25 MG tablet Take 0.125 mg by mouth at bedtime.  ? sodium chloride 0.9 % injection Special Instructions: Flush  port-a-cath with N/S 34m before administering IV fluids. ?Twice daily  ? SUMAtriptan (IMITREX) 100 MG tablet Take 100 mg by mouth 2 (two) times daily as needed for migraine.  ? [DISCONTINUED] apixaban (ELIQUIS) 2.5 MG TABS tablet Take 2.5 mg by mouth 2 (two) times daily. Special Instructions: for d-dimer 2.81 ?[DX: 2019-nCoV acute respiratory disease-06/09/21]  ? ?No facility-administered encounter medications on file as of 07/02/2021.  ? ? ? ?SIGNIFICANT DIAGNOSTIC EXAMS ? ?PREVIOUS  ? ?04-01-21: 2-d  echo:  ?1. There is severe asymmetric hypertrophy of the apical segments (up to  18 mm) consistent with apical variant hypertrophic cardiomyopathy. There  is concern for an apical aneurysm on this study, however this was a  difficult study. Would recommend cardiac  MRI for better characterization. Left ventricular ejection fraction, by  estimation, is 55 to 60%. The left ventricle has normal function. The left ventricle has no regional wall motion abnormalities. There is severe  asymmetric left ventricular  hypertrophy of the apical segment. Left ventricular diastolic parameters  are consistent with Grade I diastolic dysfunction (impaired relaxation).  ? ?05-29-21: renal ultrasound ?Redemonstration of right renal cyst without evidence of superimposed hydronephrosis. Greatest dimension estimated 8.2 cm. ?Left renal cortex not well evaluated and is atrophic/replaced by chronic cystic change/hydronephrosis in the setting of chronic UPJ obstruction described on prior CT. ? ?NO NEW EXAMS.  ? ?LABS REVIEWED;  ? ?05-14-21: hgb a1c 7.2 ?05-28-21: wbc 9.7; hgb 13.5; hct 38.8; mcv 97.5 plt 124; glucose 193; bun 28; creat 1.74; k+ 4.4; na++ 134; ca 7.8; GFR 43; protein 5.8; albumin 3.1; mag 1.5 ?06-01-21: glucose 134; bun 28; creat 1.69; k+ 3.7; na++ 136; ca 7.7; GFR 44 ?06-09-21: wbc 7.4; hgb 8.5; hct 26.8; mcv 100.8 plt 399; glucose 145; bun 19; creat 1.43; k+ 3.8; na++ 135; ca 8.3; GFR 54; crp 10.7; d-dimer 7.64  ?06-15-21: mag  2.0 ?06-18-21: CRP 1.0; d-dimer 2.81  ?06-21-21: glucose 159; bun 72; creat 5.93; k+ 5.9; na++ 130; ca 8.9; GFR 10 ?06-22-21: wbc 6.6; hgb 10.2; hct 31.8; mcv 97.8 plt 175; glucose 165; bun 70; creat 5.39; k+ 5.8; na++ 132; ca 8.7; GFR 11

## 2021-07-03 ENCOUNTER — Encounter: Payer: Self-pay | Admitting: Adult Health

## 2021-07-03 DIAGNOSIS — R319 Hematuria, unspecified: Secondary | ICD-10-CM | POA: Insufficient documentation

## 2021-07-03 NOTE — Progress Notes (Signed)
?Location:  [facility] ?Nursing Home Room Number: 124-P ?Place of Service:  SNF (31) ? ? ?CODE STATUS: Full Code ? ?Allergies  ?Allergen Reactions  ? Nsaids Other (See Comments)  ?  Chronic kidney disease - avoid NSAIDs  ? ? ?Chief Complaint  ?Patient presents with  ? Acute Visit  ?  Poor appetite   ? ? ?HPI: ? ? ? ?Past Medical History:  ?Diagnosis Date  ? Back pain   ? Bipolar disorder ([place])   ? bipolar 1  ? CKD (chronic kidney disease) stage 2, GFR 60-89 ml/min 09/24/2020  ? Hypertension   ? Knee pain   ? Migraine   ? Rectal cancer ([place])   ? Type 2 diabetes mellitus with complication, without long-term current use of insulin ([place]) 03/19/2021  ? ? ?Past Surgical History:  ?Procedure Laterality Date  ? BACK SURGERY    ? JOINT REPLACEMENT Right   ? knee  ? OSTOMY N/A 05/27/2021  ? Procedure: POSSIBLE OSTOMY;  Surgeon: Michael Boston, MD;  [facility];  Service: General;  Laterality: N/A;  ? PORTACATH PLACEMENT N/A 10/02/2020  ? Procedure: PORT-A-CATH PLACEMENT;  Surgeon: Dwan Bolt, MD;  [facility];  Service: General;  Laterality: N/A;  ? PROCTOSCOPY N/A 05/27/2021  ? Procedure: RIGID PROCTOSCOPY;  Surgeon: Michael Boston, MD;  [facility];  Service: General;  Laterality: N/A;  ? XI ROBOTIC ASSISTED LOWER ANTERIOR RESECTION N/A 05/27/2021  ? Procedure: ROBOTIC LOW ANTERIOR COLON RESECTION, VERY LOW COLOANAL ANASTOMOSIS, DIVERTING ILEOSTOMY, BILATERAL TAP BLOCK,  FIRFLY INJECTION;  Surgeon: Michael Boston, MD;  [facility];  Service: General;  Laterality: N/A;  ? ? ?Social History  ? ?Socioeconomic History  ? Marital status: Divorced  ?  Spouse name: Not on file  ? Number of children: Not on file  ? Years of education: Not on file  ? Highest education level: Not on file  ?Occupational History  ? Not on file  ?Tobacco Use  ? Smoking status: Former  ?  Packs/day: 0.50  ?  Years: 45.00  ?  Pack years: 22.50  ?  Types: Cigarettes  ?  Quit date: 04/22/2021  ?  Years since quitting: 0.1  ?  Smokeless tobacco: Never  ?Vaping Use  ? Vaping Use: Never used  ?Substance and Sexual Activity  ? Alcohol use: No  ? Drug use: No  ? Sexual activity: Not on file  ?Other Topics Concern  ? Not on file  ?Social History Narrative  ? Not on file  ? ?Social Determinants of Health  ? ?Financial Resource Strain: Not on file  ?Food Insecurity: Not on file  ?Transportation Needs: Not on file  ?Physical Activity: Not on file  ?Stress: Not on file  ?Social Connections: Not on file  ?Intimate Partner Violence: Not on file  ? ?Family History  ?Problem Relation Age of Onset  ? ALS Mother   ? Heart disease Mother   ? Parkinson's disease Mother   ? Heart failure Father   ? ? ? ? ?VITAL SIGNS ?BP 127/85   Pulse 77   Temp (!) 96.5 ?F (35.8 ?C)   Resp 18   Ht 6' (1.829 m)   Wt 216 lb 3.2 oz (98.1 kg)   SpO2 93%   BMI 29.32 kg/m?  ? ?Outpatient Encounter Medications as of 07/03/2021  ?Medication Sig  ? acetaminophen (TYLENOL) 325 MG tablet Take 650 mg by mouth every 6 (six) hours as needed for mild pain.  ?  albuterol (VENTOLIN HFA) 108 (90 Base) MCG/ACT inhaler Inhale 2 puffs into the lungs every 6 (six) hours as needed for wheezing or shortness of breath.  ? ALPRAZolam (XANAX) 1 MG tablet Take 1 tablet (1 mg total) by mouth 2 (two) times daily.  ? Amino Acids-Protein Hydrolys (FEEDING SUPPLEMENT, PRO-STAT SUGAR FREE 64,) LIQD Take 30 mLs by mouth 3 (three) times daily with meals. Special Instructions: for albumin 2.8  ? aspirin EC 81 MG tablet Take 81 mg by mouth daily. Swallow whole.  ? Cholecalciferol (VITAMIN D3) 50 MCG (2000 UT) capsule Take 2,000 Units by mouth daily.  ? enoxaparin (LOVENOX) 40 MG/0.4ML injection Inject 40 mg into the skin daily. for d-dimer 3.48  ? Heparin Sodium, Porcine, (HEPARIN LOCK FLUSH IJ) syringe; 10 unit/mL; amt: 10 units/mL; intravenous; Twice A Day ?Special Instructions: Flush port line after each use.  ? lidocaine-prilocaine (EMLA) cream Apply 1 application topically as directed. Apply 1/2  tablespoon to port-a-cath site 2 hours prior to stick and cover with plastic wrap to numb site  ? loperamide (IMODIUM) 2 MG capsule Take 2 mg by mouth every 8 (eight) hours as needed for diarrhea or loose stools.  ? loperamide (IMODIUM) 2 MG capsule Take 1 capsule (2 mg total) by mouth 2 (two) times daily.  ? magnesium oxide (MAG-OX) 400 (240 Mg) MG tablet TAKE ONE TABLET TWICE DAILY  ? melatonin 3 MG TABS tablet Take 3 mg by mouth at bedtime.  ? mirtazapine (REMERON) 45 MG tablet Take 45 mg by mouth at bedtime.  ? Needle, Disp, (HUBER NEEDLE 22GX3/4") 22G X 3/4" MISC 22 x 3/4 "; miscellaneous,Once A Day on Mon ?Special Instructions: change weekly  ? NON FORMULARY Diet: Regular Diet with Thin Liquids  Dysphagia 3  with Thin Liquids  ? Nutritional Supplements (ENSURE ENLIVE PO) Take by mouth. 120 ml with medpass to protect muscle mass and help meet increased proten/energy needs ?Twice A Day Between Meals  ? omeprazole (PRILOSEC) 20 MG capsule Take 1 capsule (20 mg total) 2 (two) times daily by mouth.  ? ondansetron (ZOFRAN) 8 MG tablet Take 1 tablet (8 mg total) by mouth every 8 (eight) hours as needed for nausea or vomiting.  ? Ostomy Supplies (STOMAHESIVE PROTECTIVE) POWD (ostomy supplies) [OTC]  ?powder; - ; topical Every Shift - PRN ?Special Instructions: Apply to stoma periwound qshift prn irritation or excoriation.  ? polycarbophil (FIBERCON) 625 MG tablet Take 1 tablet (625 mg total) by mouth 2 (two) times daily.  ? potassium chloride SA (KLOR-CON M) 20 MEQ tablet TAKE ONE TABLET ONCE DAILY  ? pravastatin (PRAVACHOL) 40 MG tablet Take 40 mg by mouth daily.  ? propranolol (INNOPRAN XL) 80 MG 24 hr capsule Take 160 mg by mouth daily.  ? risperiDONE (RISPERDAL) 0.25 MG tablet Take 0.125 mg by mouth at bedtime.  ? sodium chloride 0.9 % injection Special Instructions: Flush port-a-cath with N/S 33m before administering IV fluids. ?Twice daily  ? SUMAtriptan (IMITREX) 100 MG tablet Take 100 mg by mouth 2 (two)  times daily as needed for migraine.  ? ?No facility-administered encounter medications on file as of 07/03/2021.  ? ? ? ?SIGNIFICANT DIAGNOSTIC EXAMS ? ? ? ? ? ? ?ASSESSMENT/ PLAN: ? ? ? ? ?DOk EdwardsNP ?[facility]  ?Contact [phone] through Friday 8am- 5pm  ?After hours call [phone] ? ?

## 2021-07-04 LAB — URINE CULTURE: Culture: 20000 — AB

## 2021-07-06 ENCOUNTER — Other Ambulatory Visit (HOSPITAL_COMMUNITY)
Admission: RE | Admit: 2021-07-06 | Discharge: 2021-07-06 | Disposition: A | Discharge status: Home / Self Care | Payer: Medicare Other | Source: Skilled Nursing Facility | Attending: Adult Health | Admitting: Adult Health

## 2021-07-06 ENCOUNTER — Inpatient Hospital Stay: Payer: Medicare Other | Admitting: Oncology

## 2021-07-06 ENCOUNTER — Inpatient Hospital Stay: Payer: Medicare Other

## 2021-07-06 DIAGNOSIS — N179 Acute kidney failure, unspecified: Secondary | ICD-10-CM | POA: Diagnosis not present

## 2021-07-06 DIAGNOSIS — E1122 Type 2 diabetes mellitus with diabetic chronic kidney disease: Secondary | ICD-10-CM | POA: Diagnosis not present

## 2021-07-06 DIAGNOSIS — Z905 Acquired absence of kidney: Secondary | ICD-10-CM | POA: Diagnosis not present

## 2021-07-06 DIAGNOSIS — R279 Unspecified lack of coordination: Secondary | ICD-10-CM | POA: Diagnosis not present

## 2021-07-06 DIAGNOSIS — R262 Difficulty in walking, not elsewhere classified: Secondary | ICD-10-CM | POA: Diagnosis not present

## 2021-07-06 DIAGNOSIS — I129 Hypertensive chronic kidney disease with stage 1 through stage 4 chronic kidney disease, or unspecified chronic kidney disease: Secondary | ICD-10-CM | POA: Diagnosis not present

## 2021-07-06 DIAGNOSIS — R2681 Unsteadiness on feet: Secondary | ICD-10-CM | POA: Diagnosis not present

## 2021-07-06 DIAGNOSIS — C20 Malignant neoplasm of rectum: Secondary | ICD-10-CM | POA: Diagnosis not present

## 2021-07-06 DIAGNOSIS — R1312 Dysphagia, oropharyngeal phase: Secondary | ICD-10-CM | POA: Diagnosis not present

## 2021-07-06 DIAGNOSIS — N183 Chronic kidney disease, stage 3 unspecified: Secondary | ICD-10-CM | POA: Diagnosis not present

## 2021-07-06 DIAGNOSIS — Z741 Need for assistance with personal care: Secondary | ICD-10-CM | POA: Diagnosis not present

## 2021-07-06 DIAGNOSIS — U071 COVID-19: Secondary | ICD-10-CM | POA: Diagnosis not present

## 2021-07-06 DIAGNOSIS — M6281 Muscle weakness (generalized): Secondary | ICD-10-CM | POA: Diagnosis not present

## 2021-07-06 LAB — CBC
HCT: 28.1 % — ABNORMAL LOW (ref 39.0–52.0)
Hemoglobin: 8.9 g/dL — ABNORMAL LOW (ref 13.0–17.0)
MCH: 30.9 pg (ref 26.0–34.0)
MCHC: 31.7 g/dL (ref 30.0–36.0)
MCV: 97.6 fL (ref 80.0–100.0)
Platelets: 188 10*3/uL (ref 150–400)
RBC: 2.88 MIL/uL — ABNORMAL LOW (ref 4.22–5.81)
RDW: 14.2 % (ref 11.5–15.5)
WBC: 4.7 10*3/uL (ref 4.0–10.5)
nRBC: 0 % (ref 0.0–0.2)

## 2021-07-06 LAB — COMPREHENSIVE METABOLIC PANEL
ALT: 17 U/L (ref 0–44)
AST: 19 U/L (ref 15–41)
Albumin: 2.5 g/dL — ABNORMAL LOW (ref 3.5–5.0)
Alkaline Phosphatase: 106 U/L (ref 38–126)
Anion gap: 5 (ref 5–15)
BUN: 8 mg/dL (ref 8–23)
CO2: 28 mmol/L (ref 22–32)
Calcium: 8.2 mg/dL — ABNORMAL LOW (ref 8.9–10.3)
Chloride: 105 mmol/L (ref 98–111)
Creatinine, Ser: 1.33 mg/dL — ABNORMAL HIGH (ref 0.61–1.24)
GFR, Estimated: 59 mL/min — ABNORMAL LOW (ref >60)
Glucose, Bld: 114 mg/dL — ABNORMAL HIGH (ref 70–99)
Potassium: 3.4 mmol/L — ABNORMAL LOW (ref 3.5–5.1)
Sodium: 138 mmol/L (ref 135–145)
Total Bilirubin: 0.5 mg/dL (ref 0.3–1.2)
Total Protein: 6 g/dL — ABNORMAL LOW (ref 6.5–8.1)

## 2021-07-07 ENCOUNTER — Other Ambulatory Visit: Payer: Self-pay | Admitting: Adult Health

## 2021-07-07 ENCOUNTER — Non-Acute Institutional Stay (SKILLED_NURSING_FACILITY): Payer: Medicare Other | Admitting: Adult Health

## 2021-07-07 ENCOUNTER — Encounter: Payer: Self-pay | Admitting: Adult Health

## 2021-07-07 DIAGNOSIS — R2681 Unsteadiness on feet: Secondary | ICD-10-CM | POA: Diagnosis not present

## 2021-07-07 DIAGNOSIS — R1312 Dysphagia, oropharyngeal phase: Secondary | ICD-10-CM | POA: Diagnosis not present

## 2021-07-07 DIAGNOSIS — Z932 Ileostomy status: Secondary | ICD-10-CM | POA: Diagnosis not present

## 2021-07-07 DIAGNOSIS — R198 Other specified symptoms and signs involving the digestive system and abdomen: Secondary | ICD-10-CM | POA: Diagnosis not present

## 2021-07-07 DIAGNOSIS — M6281 Muscle weakness (generalized): Secondary | ICD-10-CM | POA: Diagnosis not present

## 2021-07-07 DIAGNOSIS — C20 Malignant neoplasm of rectum: Secondary | ICD-10-CM | POA: Diagnosis not present

## 2021-07-07 DIAGNOSIS — R634 Abnormal weight loss: Secondary | ICD-10-CM | POA: Insufficient documentation

## 2021-07-07 DIAGNOSIS — U071 COVID-19: Secondary | ICD-10-CM | POA: Diagnosis not present

## 2021-07-07 DIAGNOSIS — R279 Unspecified lack of coordination: Secondary | ICD-10-CM | POA: Diagnosis not present

## 2021-07-07 DIAGNOSIS — Z741 Need for assistance with personal care: Secondary | ICD-10-CM | POA: Diagnosis not present

## 2021-07-07 DIAGNOSIS — R262 Difficulty in walking, not elsewhere classified: Secondary | ICD-10-CM | POA: Diagnosis not present

## 2021-07-07 MED ORDER — DRONABINOL 2.5 MG PO CAPS: 2.5000 mg | ORAL_CAPSULE | Freq: Every day | ORAL | 0 refills | Status: AC

## 2021-07-07 NOTE — Progress Notes (Signed)
This encounter was created in error - please disregard.

## 2021-07-07 NOTE — Progress Notes (Signed)
?Location:  [facility] Room Number: 124-P ?Place of Service:  SNF (31) ? ? ?CODE STATUS: Full Code  ? ?Allergies  ?Allergen Reactions  ? Nsaids Other (See Comments)  ?  Chronic kidney disease - avoid NSAIDs  ? ? ?Chief Complaint  ?Patient presents with  ? Acute Visit  ?  Weight loss   ? ? ?HPI: ? ?He iw losing weight. On 06-05-21 weight was 230 pounds her current weight is 214 pound a 16 pound loss. He tells me that he is not hungry. He states that he has no appetite at all. His ileostomy has some solid;mostly liquid  present. Small volume is present.  ? ?Past Medical History:  ?Diagnosis Date  ? Back pain   ? Bipolar disorder ([place])   ? bipolar 1  ? CKD (chronic kidney disease) stage 2, GFR 60-89 ml/min 09/24/2020  ? Hypertension   ? Knee pain   ? Migraine   ? Rectal cancer ([place])   ? Type 2 diabetes mellitus with complication, without long-term current use of insulin ([place]) 03/19/2021  ? ? ?Past Surgical History:  ?Procedure Laterality Date  ? BACK SURGERY    ? JOINT REPLACEMENT Right   ? knee  ? OSTOMY N/A 05/27/2021  ? Procedure: POSSIBLE OSTOMY;  Surgeon: Michael Boston, MD;  Location: [facility];  Service: General;  Laterality: N/A;  ? PORTACATH PLACEMENT N/A 10/02/2020  ? Procedure: PORT-A-CATH PLACEMENT;  Surgeon: Dwan Bolt, MD;  Location: [facility];  Service: General;  Laterality: N/A;  ? PROCTOSCOPY N/A 05/27/2021  ? Procedure: RIGID PROCTOSCOPY;  Surgeon: Michael Boston, MD;  Location: [facility];  Service: General;  Laterality: N/A;  ? XI ROBOTIC ASSISTED LOWER ANTERIOR RESECTION N/A 05/27/2021  ? Procedure: ROBOTIC LOW ANTERIOR COLON RESECTION, VERY LOW COLOANAL ANASTOMOSIS, DIVERTING ILEOSTOMY, BILATERAL TAP BLOCK,  FIRFLY INJECTION;  Surgeon: Michael Boston, MD;  Location: [facility];  Service: General;  Laterality: N/A;  ? ? ?Social History  ? ?Socioeconomic History  ? Marital status: Divorced  ?  Spouse name: Not on file  ? Number of children: Not on file  ? Years of education: Not on file  ?  Highest education level: Not on file  ?Occupational History  ? Not on file  ?Tobacco Use  ? Smoking status: Former  ?  Packs/day: 0.50  ?  Years: 45.00  ?  Pack years: 22.50  ?  Types: Cigarettes  ?  Quit date: 04/22/2021  ?  Years since quitting: 0.2  ? Smokeless tobacco: Never  ?Vaping Use  ? Vaping Use: Never used  ?Substance and Sexual Activity  ? Alcohol use: No  ? Drug use: No  ? Sexual activity: Not on file  ?Other Topics Concern  ? Not on file  ?Social History Narrative  ? Not on file  ? ?Social Determinants of Health  ? ?Financial Resource Strain: Not on file  ?Food Insecurity: Not on file  ?Transportation Needs: Not on file  ?Physical Activity: Not on file  ?Stress: Not on file  ?Social Connections: Not on file  ?Intimate Partner Violence: Not on file  ? ?Family History  ?Problem Relation Age of Onset  ? ALS Mother   ? Heart disease Mother   ? Parkinson's disease Mother   ? Heart failure Father   ? ? ? ? ?VITAL SIGNS ?BP 127/85   Pulse 77   Temp (!) 97.1 ?F (36.2 ?C)   Resp 18   Ht 6' (1.829 m)  Wt 214 lb (97.1 kg)   SpO2 93%   BMI 29.02 kg/m?  ? ?Outpatient Encounter Medications as of 07/07/2021  ?Medication Sig  ? acetaminophen (TYLENOL) 325 MG tablet Take 650 mg by mouth every 6 (six) hours as needed for mild pain.  ? albuterol (VENTOLIN HFA) 108 (90 Base) MCG/ACT inhaler Inhale 2 puffs into the lungs every 6 (six) hours as needed for wheezing or shortness of breath.  ? ALPRAZolam (XANAX) 1 MG tablet Take 1 mg by mouth at bedtime. Scheduled and every morning as needed for anxiety, nerves, and behaviors  ? Amino Acids-Protein Hydrolys (FEEDING SUPPLEMENT, PRO-STAT SUGAR FREE 64,) LIQD Take 30 mLs by mouth 3 (three) times daily with meals. Special Instructions: for albumin 2.8  ? aspirin EC 81 MG tablet Take 81 mg by mouth daily. Swallow whole.  ? Cholecalciferol (VITAMIN D3) 50 MCG (2000 UT) capsule Take 2,000 Units by mouth daily.  ? dronabinol (MARINOL) 2.5 MG capsule Take 1 capsule (2.5 mg total)  by mouth daily before lunch.  ? enoxaparin (LOVENOX) 40 MG/0.4ML injection Inject 40 mg into the skin daily. for d-dimer 3.48  ? Heparin Sodium, Porcine, (HEPARIN LOCK FLUSH IJ) syringe; 10 unit/mL; amt: 10 units/mL; intravenous; Twice A Day ?Special Instructions: Flush port line after each use.  ? lidocaine-prilocaine (EMLA) cream Apply 1 application topically as directed. Apply 1/2 tablespoon to port-a-cath site 2 hours prior to stick and cover with plastic wrap to numb site  ? loperamide (IMODIUM) 2 MG capsule Take 2 mg by mouth every 8 (eight) hours as needed for diarrhea or loose stools.  ? loperamide (IMODIUM) 2 MG capsule Take 1 capsule (2 mg total) by mouth 2 (two) times daily.  ? magnesium oxide (MAG-OX) 400 (240 Mg) MG tablet TAKE ONE TABLET TWICE DAILY  ? melatonin 3 MG TABS tablet Take 3 mg by mouth at bedtime.  ? mirtazapine (REMERON) 45 MG tablet Take 45 mg by mouth at bedtime.  ? NON FORMULARY Diet: Regular Diet with Thin Liquids  Dysphagia 3  ? Nutritional Supplements (ENSURE ENLIVE PO) Take by mouth. 120 ml with medpass to protect muscle mass and help meet increased proten/energy needs ?Twice A Day Between Meals  ? omeprazole (PRILOSEC) 20 MG capsule Take 1 capsule (20 mg total) 2 (two) times daily by mouth.  ? ondansetron (ZOFRAN) 8 MG tablet Take 1 tablet (8 mg total) by mouth every 8 (eight) hours as needed for nausea or vomiting.  ? Ostomy Supplies (STOMAHESIVE PROTECTIVE) POWD (ostomy supplies) [OTC]  ?powder; - ; topical Every Shift - PRN ?Special Instructions: Apply to stoma periwound qshift prn irritation or excoriation.  ? polycarbophil (FIBERCON) 625 MG tablet Take 1 tablet (625 mg total) by mouth 2 (two) times daily.  ? potassium chloride SA (KLOR-CON M) 20 MEQ tablet TAKE ONE TABLET ONCE DAILY  ? pravastatin (PRAVACHOL) 40 MG tablet Take 40 mg by mouth daily.  ? propranolol (INNOPRAN XL) 80 MG 24 hr capsule Take 160 mg by mouth daily.  ? risperiDONE (RISPERDAL) 0.25 MG tablet Take 0.125  mg by mouth at bedtime.  ? sodium chloride 0.9 % injection Special Instructions: Flush port-a-cath with N/S 79m before administering IV fluids. ?Twice daily  ? SUMAtriptan (IMITREX) 100 MG tablet Take 100 mg by mouth 2 (two) times daily as needed for migraine.  ? Needle, Disp, (HUBER NEEDLE 22GX3/4") 22G X 3/4" MISC 22 x 3/4 "; miscellaneous,Once A Day on Mon ?Special Instructions: change weekly  ? [DISCONTINUED] ALPRAZolam (Duanne Moron 1  MG tablet Take 1 tablet (1 mg total) by mouth 2 (two) times daily.  ? ?No facility-administered encounter medications on file as of 07/07/2021.  ? ? ? ?SIGNIFICANT DIAGNOSTIC EXAMS ? ?PREVIOUS  ? ?04-01-21: 2-d echo:  ?1. There is severe asymmetric hypertrophy of the apical segments (up to  18 mm) consistent with apical variant hypertrophic cardiomyopathy. There  is concern for an apical aneurysm on this study, however this was a  difficult study. Would recommend cardiac  MRI for better characterization. Left ventricular ejection fraction, by  estimation, is 55 to 60%. The left ventricle has normal function. The left ventricle has no regional wall motion abnormalities. There is severe  asymmetric left ventricular  hypertrophy of the apical segment. Left ventricular diastolic parameters  are consistent with Grade I diastolic dysfunction (impaired relaxation).  ? ?05-29-21: renal ultrasound ?Redemonstration of right renal cyst without evidence of superimposed hydronephrosis. Greatest dimension estimated 8.2 cm. ?Left renal cortex not well evaluated and is atrophic/replaced by chronic cystic change/hydronephrosis in the setting of chronic UPJ obstruction described on prior CT. ? ?NO NEW EXAMS.  ? ?LABS REVIEWED;  ? ?05-14-21: hgb a1c 7.2 ?05-28-21: wbc 9.7; hgb 13.5; hct 38.8; mcv 97.5 plt 124; glucose 193; bun 28; creat 1.74; k+ 4.4; na++ 134; ca 7.8; GFR 43; protein 5.8; albumin 3.1; mag 1.5 ?06-01-21: glucose 134; bun 28; creat 1.69; k+ 3.7; na++ 136; ca 7.7; GFR 44 ?06-09-21: wbc 7.4; hgb 8.5;  hct 26.8; mcv 100.8 plt 399; glucose 145; bun 19; creat 1.43; k+ 3.8; na++ 135; ca 8.3; GFR 54; crp 10.7; d-dimer 7.64  ?06-15-21: mag 2.0 ?06-18-21: CRP 1.0; d-dimer 2.81  ?06-21-21: glucose 159; bun 72; creat 5

## 2021-07-08 DIAGNOSIS — R262 Difficulty in walking, not elsewhere classified: Secondary | ICD-10-CM | POA: Diagnosis not present

## 2021-07-08 DIAGNOSIS — M6281 Muscle weakness (generalized): Secondary | ICD-10-CM | POA: Diagnosis not present

## 2021-07-08 DIAGNOSIS — Z741 Need for assistance with personal care: Secondary | ICD-10-CM | POA: Diagnosis not present

## 2021-07-08 DIAGNOSIS — R279 Unspecified lack of coordination: Secondary | ICD-10-CM | POA: Diagnosis not present

## 2021-07-08 DIAGNOSIS — R1312 Dysphagia, oropharyngeal phase: Secondary | ICD-10-CM | POA: Diagnosis not present

## 2021-07-08 DIAGNOSIS — U071 COVID-19: Secondary | ICD-10-CM | POA: Diagnosis not present

## 2021-07-08 DIAGNOSIS — C20 Malignant neoplasm of rectum: Secondary | ICD-10-CM | POA: Diagnosis not present

## 2021-07-08 DIAGNOSIS — R2681 Unsteadiness on feet: Secondary | ICD-10-CM | POA: Diagnosis not present

## 2021-07-09 DIAGNOSIS — R2681 Unsteadiness on feet: Secondary | ICD-10-CM | POA: Diagnosis not present

## 2021-07-09 DIAGNOSIS — C20 Malignant neoplasm of rectum: Secondary | ICD-10-CM | POA: Diagnosis not present

## 2021-07-09 DIAGNOSIS — R279 Unspecified lack of coordination: Secondary | ICD-10-CM | POA: Diagnosis not present

## 2021-07-09 DIAGNOSIS — R262 Difficulty in walking, not elsewhere classified: Secondary | ICD-10-CM | POA: Diagnosis not present

## 2021-07-09 DIAGNOSIS — M6281 Muscle weakness (generalized): Secondary | ICD-10-CM | POA: Diagnosis not present

## 2021-07-09 DIAGNOSIS — U071 COVID-19: Secondary | ICD-10-CM | POA: Diagnosis not present

## 2021-07-09 DIAGNOSIS — R1312 Dysphagia, oropharyngeal phase: Secondary | ICD-10-CM | POA: Diagnosis not present

## 2021-07-09 DIAGNOSIS — Z741 Need for assistance with personal care: Secondary | ICD-10-CM | POA: Diagnosis not present

## 2021-07-10 ENCOUNTER — Encounter: Payer: Self-pay | Admitting: Adult Health

## 2021-07-10 ENCOUNTER — Non-Acute Institutional Stay (SKILLED_NURSING_FACILITY): Payer: Medicare Other | Admitting: Adult Health

## 2021-07-10 ENCOUNTER — Other Ambulatory Visit (HOSPITAL_COMMUNITY)
Admission: RE | Admit: 2021-07-10 | Discharge: 2021-07-10 | Disposition: A | Discharge status: Home / Self Care | Payer: Medicare Other | Source: Skilled Nursing Facility | Attending: Adult Health | Admitting: Adult Health

## 2021-07-10 DIAGNOSIS — C20 Malignant neoplasm of rectum: Secondary | ICD-10-CM

## 2021-07-10 DIAGNOSIS — N390 Urinary tract infection, site not specified: Secondary | ICD-10-CM | POA: Insufficient documentation

## 2021-07-10 DIAGNOSIS — R198 Other specified symptoms and signs involving the digestive system and abdomen: Secondary | ICD-10-CM

## 2021-07-10 DIAGNOSIS — I129 Hypertensive chronic kidney disease with stage 1 through stage 4 chronic kidney disease, or unspecified chronic kidney disease: Secondary | ICD-10-CM | POA: Diagnosis not present

## 2021-07-10 DIAGNOSIS — D6869 Other thrombophilia: Secondary | ICD-10-CM

## 2021-07-10 DIAGNOSIS — U071 COVID-19: Secondary | ICD-10-CM

## 2021-07-10 DIAGNOSIS — N1831 Chronic kidney disease, stage 3a: Secondary | ICD-10-CM | POA: Diagnosis not present

## 2021-07-10 DIAGNOSIS — E1159 Type 2 diabetes mellitus with other circulatory complications: Secondary | ICD-10-CM

## 2021-07-10 DIAGNOSIS — R2681 Unsteadiness on feet: Secondary | ICD-10-CM | POA: Diagnosis not present

## 2021-07-10 DIAGNOSIS — Z741 Need for assistance with personal care: Secondary | ICD-10-CM | POA: Diagnosis not present

## 2021-07-10 DIAGNOSIS — E1129 Type 2 diabetes mellitus with other diabetic kidney complication: Secondary | ICD-10-CM | POA: Insufficient documentation

## 2021-07-10 DIAGNOSIS — M6281 Muscle weakness (generalized): Secondary | ICD-10-CM | POA: Diagnosis not present

## 2021-07-10 DIAGNOSIS — R279 Unspecified lack of coordination: Secondary | ICD-10-CM | POA: Diagnosis not present

## 2021-07-10 DIAGNOSIS — Z932 Ileostomy status: Secondary | ICD-10-CM

## 2021-07-10 DIAGNOSIS — E1122 Type 2 diabetes mellitus with diabetic chronic kidney disease: Secondary | ICD-10-CM

## 2021-07-10 DIAGNOSIS — R1312 Dysphagia, oropharyngeal phase: Secondary | ICD-10-CM | POA: Diagnosis not present

## 2021-07-10 DIAGNOSIS — R262 Difficulty in walking, not elsewhere classified: Secondary | ICD-10-CM | POA: Diagnosis not present

## 2021-07-10 NOTE — Progress Notes (Signed)
?Location:  [facility] ?Nursing Home Room Number: 124-P ?Place of Service:  SNF (31) ? ? ?CODE STATUS: Full Code ? ?Allergies  ?Allergen Reactions  ? Nsaids Other (See Comments)  ?  Chronic kidney disease - avoid NSAIDs  ? ? ?Chief Complaint  ?Patient presents with  ? Medical Management of Chronic Issues ? ?                     High output ileostomy/rectal cancer:  Type 2 diabetes mellitus with complication without long term use of insulin:. Hypertension associated with stage 3a chronic kidney disease due to type 2 diabetes mellitus:Hyperlipidemia associated with type 2 diabetes mellitus  ? ? ?HPI: ? ?He is a 66 year old man who is being seen for his chronic illnesses including:  High output ileostomy/rectal cancer:  Type 2 diabetes mellitus with complication without long term use of insulin:. Hypertension associated with stage 3a chronic kidney disease due to type 2 diabetes mellitus:Hyperlipidemia associated with type 2 diabetes mellitus. There are no reports of uncontrolled pain. He is complaining of urinary urgency despite having a foley; has nausea; and has lower abdominal discomfort. There are no reports of fevers present.  ? ?Past Medical History:  ?Diagnosis Date  ? Back pain   ? Bipolar disorder ([place])   ? bipolar 1  ? CKD (chronic kidney disease) stage 2, GFR 60-89 ml/min 09/24/2020  ? Hypertension   ? Knee pain   ? Migraine   ? Rectal cancer ([place])   ? Type 2 diabetes mellitus with complication, without long-term current use of insulin ([place]) 03/19/2021  ? ? ?Past Surgical History:  ?Procedure Laterality Date  ? BACK SURGERY    ? JOINT REPLACEMENT Right   ? knee  ? OSTOMY N/A 05/27/2021  ? Procedure: POSSIBLE OSTOMY;  Surgeon: Michael Boston, MD;  [facility];  Service: General;  Laterality: N/A;  ? PORTACATH PLACEMENT N/A 10/02/2020  ? Procedure: PORT-A-CATH PLACEMENT;  Surgeon: Dwan Bolt, MD;  Location: [facility];  Service: General;  Laterality: N/A;  ? PROCTOSCOPY N/A 05/27/2021  ? Procedure:  RIGID PROCTOSCOPY;  Surgeon: Michael Boston, MD;  Location: [facility];  Service: General;  Laterality: N/A;  ? XI ROBOTIC ASSISTED LOWER ANTERIOR RESECTION N/A 05/27/2021  ? Procedure: ROBOTIC LOW ANTERIOR COLON RESECTION, VERY LOW COLOANAL ANASTOMOSIS, DIVERTING ILEOSTOMY, BILATERAL TAP BLOCK,  FIRFLY INJECTION;  Surgeon: Michael Boston, MD;  Location: [facility];  Service: General;  Laterality: N/A;  ? ? ?Social History  ? ?Socioeconomic History  ? Marital status: Divorced  ?  Spouse name: Not on file  ? Number of children: Not on file  ? Years of education: Not on file  ? Highest education level: Not on file  ?Occupational History  ? Not on file  ?Tobacco Use  ? Smoking status: Former  ?  Packs/day: 0.50  ?  Years: 45.00  ?  Pack years: 22.50  ?  Types: Cigarettes  ?  Quit date: 04/22/2021  ?  Years since quitting: 0.2  ? Smokeless tobacco: Never  ?Vaping Use  ? Vaping Use: Never used  ?Substance and Sexual Activity  ? Alcohol use: No  ? Drug use: No  ? Sexual activity: Not on file  ?Other Topics Concern  ? Not on file  ?Social History Narrative  ? Not on file  ? ?Social Determinants of Health  ? ?Financial Resource Strain: Not on file  ?Food Insecurity: Not on file  ?Transportation Needs: Not on  file  ?Physical Activity: Not on file  ?Stress: Not on file  ?Social Connections: Not on file  ?Intimate Partner Violence: Not on file  ? ?Family History  ?Problem Relation Age of Onset  ? ALS Mother   ? Heart disease Mother   ? Parkinson's disease Mother   ? Heart failure Father   ? ? ? ? ?VITAL SIGNS ?BP 127/85   Pulse 77   Temp (!) 97.5 ?F (36.4 ?C)   Resp 18   Ht 6' (1.829 m)   Wt 214 lb 6.4 oz (97.3 kg)   SpO2 93%   BMI 29.08 kg/m?  ? ?Outpatient Encounter Medications as of 07/10/2021  ?Medication Sig  ? acetaminophen (TYLENOL) 325 MG tablet Take 650 mg by mouth every 6 (six) hours as needed for mild pain.  ? albuterol (VENTOLIN HFA) 108 (90 Base) MCG/ACT inhaler Inhale 2 puffs into the lungs every 6 (six) hours as needed  for wheezing or shortness of breath.  ? ALPRAZolam (XANAX) 1 MG tablet Take 1 mg by mouth at bedtime. Every morning as needed for anxiety, nerves, and behavior  ? Amino Acids-Protein Hydrolys (FEEDING SUPPLEMENT, PRO-STAT SUGAR FREE 64,) LIQD Take 30 mLs by mouth 3 (three) times daily with meals. Special Instructions: for albumin 2.8  ? aspirin EC 81 MG tablet Take 81 mg by mouth daily. Swallow whole.  ? Cholecalciferol (VITAMIN D3) 50 MCG (2000 UT) capsule Take 2,000 Units by mouth daily.  ? dronabinol (MARINOL) 2.5 MG capsule Take 1 capsule (2.5 mg total) by mouth daily before lunch.  ? enoxaparin (LOVENOX) 40 MG/0.4ML injection Inject 40 mg into the skin daily. for d-dimer 3.48  ? Heparin Sodium, Porcine, (HEPARIN LOCK FLUSH IJ) syringe; 10 unit/mL; amt: 10 units/mL; intravenous; Twice A Day ?Special Instructions: Flush port line after each use.  ? lidocaine-prilocaine (EMLA) cream Apply 1 application topically as directed. Apply 1/2 tablespoon to port-a-cath site 2 hours prior to stick and cover with plastic wrap to numb site  ? loperamide (IMODIUM) 2 MG capsule Take 2 mg by mouth every 8 (eight) hours as needed for diarrhea or loose stools.  ? loperamide (IMODIUM) 2 MG capsule Take 1 capsule (2 mg total) by mouth 2 (two) times daily.  ? magnesium oxide (MAG-OX) 400 (240 Mg) MG tablet TAKE ONE TABLET TWICE DAILY  ? melatonin 3 MG TABS tablet Take 3 mg by mouth at bedtime.  ? mirtazapine (REMERON) 45 MG tablet Take 45 mg by mouth at bedtime.  ? Needle, Disp, (HUBER NEEDLE 22GX3/4") 22G X 3/4" MISC 22 x 3/4 "; miscellaneous,Once A Day on Mon ?Special Instructions: change weekly  ? NON FORMULARY Diet:Regular Diet with Thin Liquids  ? Nutritional Supplements (ENSURE ENLIVE PO) Take by mouth. 120 ml with medpass to protect muscle mass and help meet increased proten/energy needs ?Twice A Day Between Meals  ? omeprazole (PRILOSEC) 20 MG capsule Take 1 capsule (20 mg total) 2 (two) times daily by mouth.  ? ondansetron  (ZOFRAN) 8 MG tablet Take 1 tablet (8 mg total) by mouth every 8 (eight) hours as needed for nausea or vomiting.  ? Ostomy Supplies (STOMAHESIVE PROTECTIVE) POWD (ostomy supplies) [OTC]  ?powder; - ; topical Every Shift - PRN ?Special Instructions: Apply to stoma periwound qshift prn irritation or excoriation.  ? polycarbophil (FIBERCON) 625 MG tablet Take 1 tablet (625 mg total) by mouth 2 (two) times daily.  ? potassium chloride SA (KLOR-CON M) 20 MEQ tablet Take 20 mEq by mouth 2 (two)  times daily.  ? pravastatin (PRAVACHOL) 40 MG tablet Take 40 mg by mouth daily.  ? propranolol (INNOPRAN XL) 80 MG 24 hr capsule Take 160 mg by mouth daily.  ? risperiDONE (RISPERDAL) 0.25 MG tablet Take 0.125 mg by mouth at bedtime.  ? sodium chloride 0.9 % injection Special Instructions: Flush port-a-cath with N/S 60m before administering IV fluids. ?Twice daily  ? SUMAtriptan (IMITREX) 100 MG tablet Take 100 mg by mouth 2 (two) times daily as needed for migraine.  ? [DISCONTINUED] potassium chloride SA (KLOR-CON M) 20 MEQ tablet TAKE ONE TABLET ONCE DAILY (Patient taking differently: 20 mEq 2 (two) times daily.)  ? ?No facility-administered encounter medications on file as of 07/10/2021.  ? ? ? ?SIGNIFICANT DIAGNOSTIC EXAMS ? ?PREVIOUS  ? ?04-01-21: 2-d echo:  ?1. There is severe asymmetric hypertrophy of the apical segments (up to  18 mm) consistent with apical variant hypertrophic cardiomyopathy. There  is concern for an apical aneurysm on this study, however this was a  difficult study. Would recommend cardiac  MRI for better characterization. Left ventricular ejection fraction, by  estimation, is 55 to 60%. The left ventricle has normal function. The left ventricle has no regional wall motion abnormalities. There is severe  asymmetric left ventricular  hypertrophy of the apical segment. Left ventricular diastolic parameters  are consistent with Grade I diastolic dysfunction (impaired relaxation).  ? ?05-29-21: renal  ultrasound ?Redemonstration of right renal cyst without evidence of superimposed hydronephrosis. Greatest dimension estimated 8.2 cm. ?Left renal cortex not well evaluated and is atrophic/replaced by chronic cystic ch

## 2021-07-12 DIAGNOSIS — R2681 Unsteadiness on feet: Secondary | ICD-10-CM | POA: Diagnosis not present

## 2021-07-12 DIAGNOSIS — R279 Unspecified lack of coordination: Secondary | ICD-10-CM | POA: Diagnosis not present

## 2021-07-12 DIAGNOSIS — Z741 Need for assistance with personal care: Secondary | ICD-10-CM | POA: Diagnosis not present

## 2021-07-12 DIAGNOSIS — C20 Malignant neoplasm of rectum: Secondary | ICD-10-CM | POA: Diagnosis not present

## 2021-07-12 DIAGNOSIS — R262 Difficulty in walking, not elsewhere classified: Secondary | ICD-10-CM | POA: Diagnosis not present

## 2021-07-12 DIAGNOSIS — M6281 Muscle weakness (generalized): Secondary | ICD-10-CM | POA: Diagnosis not present

## 2021-07-12 DIAGNOSIS — U071 COVID-19: Secondary | ICD-10-CM | POA: Diagnosis not present

## 2021-07-12 DIAGNOSIS — R1312 Dysphagia, oropharyngeal phase: Secondary | ICD-10-CM | POA: Diagnosis not present

## 2021-07-13 ENCOUNTER — Encounter: Payer: Self-pay | Admitting: Adult Health

## 2021-07-13 ENCOUNTER — Non-Acute Institutional Stay (SKILLED_NURSING_FACILITY): Payer: Medicare Other | Admitting: Adult Health

## 2021-07-13 DIAGNOSIS — M6281 Muscle weakness (generalized): Secondary | ICD-10-CM | POA: Diagnosis not present

## 2021-07-13 DIAGNOSIS — R262 Difficulty in walking, not elsewhere classified: Secondary | ICD-10-CM | POA: Diagnosis not present

## 2021-07-13 DIAGNOSIS — C20 Malignant neoplasm of rectum: Secondary | ICD-10-CM | POA: Diagnosis not present

## 2021-07-13 DIAGNOSIS — R1312 Dysphagia, oropharyngeal phase: Secondary | ICD-10-CM | POA: Diagnosis not present

## 2021-07-13 DIAGNOSIS — R279 Unspecified lack of coordination: Secondary | ICD-10-CM | POA: Diagnosis not present

## 2021-07-13 DIAGNOSIS — N390 Urinary tract infection, site not specified: Secondary | ICD-10-CM | POA: Diagnosis not present

## 2021-07-13 DIAGNOSIS — Z741 Need for assistance with personal care: Secondary | ICD-10-CM | POA: Diagnosis not present

## 2021-07-13 DIAGNOSIS — R2681 Unsteadiness on feet: Secondary | ICD-10-CM | POA: Diagnosis not present

## 2021-07-13 DIAGNOSIS — U071 COVID-19: Secondary | ICD-10-CM | POA: Diagnosis not present

## 2021-07-13 LAB — URINE CULTURE: Culture: >=100000 — AB

## 2021-07-13 NOTE — Progress Notes (Signed)
?Location:  [facility] Room Number: 124-P ?Place of Service:  SNF (31) ? ? ?CODE STATUS: Full Code ? ?Allergies  ?Allergen Reactions  ? Nsaids Other (See Comments)  ?  Chronic kidney disease - avoid NSAIDs  ? ? ?Chief Complaint  ?Patient presents with  ? Acute Visit  ?  Urinary tract infection  ? ? ?HPI: ? ?He had a urine culture done due to urgency; nausea and lower abdomen discomfort. His culture grew >100,000 colonies of enterobacter cloacae. There are no reports of fevers present. He does have a loose nonproductive cough.   ? ?Past Medical History:  ?Diagnosis Date  ? Back pain   ? Bipolar disorder ([place])   ? bipolar 1  ? CKD (chronic kidney disease) stage 2, GFR 60-89 ml/min 09/24/2020  ? Hypertension   ? Knee pain   ? Migraine   ? Rectal cancer ([place])   ? Type 2 diabetes mellitus with complication, without long-term current use of insulin ([place]) 03/19/2021  ? ? ?Past Surgical History:  ?Procedure Laterality Date  ? BACK SURGERY    ? JOINT REPLACEMENT Right   ? knee  ? OSTOMY N/A 05/27/2021  ? Procedure: POSSIBLE OSTOMY;  Surgeon: Michael Boston, MD;  Location: [facility];  Service: General;  Laterality: N/A;  ? PORTACATH PLACEMENT N/A 10/02/2020  ? Procedure: PORT-A-CATH PLACEMENT;  Surgeon: Dwan Bolt, MD;  Location: [facility];  Service: General;  Laterality: N/A;  ? PROCTOSCOPY N/A 05/27/2021  ? Procedure: RIGID PROCTOSCOPY;  Surgeon: Michael Boston, MD;  Location: [facility];  Service: General;  Laterality: N/A;  ? XI ROBOTIC ASSISTED LOWER ANTERIOR RESECTION N/A 05/27/2021  ? Procedure: ROBOTIC LOW ANTERIOR COLON RESECTION, VERY LOW COLOANAL ANASTOMOSIS, DIVERTING ILEOSTOMY, BILATERAL TAP BLOCK,  FIRFLY INJECTION;  Surgeon: Michael Boston, MD;  Location: [facility];  Service: General;  Laterality: N/A;  ? ? ?Social History  ? ?Socioeconomic History  ? Marital status: Divorced  ?  Spouse name: Not on file  ? Number of children: Not on file  ? Years of education: Not on file  ? Highest education level:  Not on file  ?Occupational History  ? Not on file  ?Tobacco Use  ? Smoking status: Former  ?  Packs/day: 0.50  ?  Years: 45.00  ?  Pack years: 22.50  ?  Types: Cigarettes  ?  Quit date: 04/22/2021  ?  Years since quitting: 0.2  ? Smokeless tobacco: Never  ?Vaping Use  ? Vaping Use: Never used  ?Substance and Sexual Activity  ? Alcohol use: No  ? Drug use: No  ? Sexual activity: Not on file  ?Other Topics Concern  ? Not on file  ?Social History Narrative  ? Not on file  ? ?Social Determinants of Health  ? ?Financial Resource Strain: Not on file  ?Food Insecurity: Not on file  ?Transportation Needs: Not on file  ?Physical Activity: Not on file  ?Stress: Not on file  ?Social Connections: Not on file  ?Intimate Partner Violence: Not on file  ? ?Family History  ?Problem Relation Age of Onset  ? ALS Mother   ? Heart disease Mother   ? Parkinson's disease Mother   ? Heart failure Father   ? ? ? ? ?VITAL SIGNS ?BP 127/85   Pulse 77   Temp 98 ?F (36.7 ?C)   Resp 18   Ht 6' (1.829 m)   Wt 214 lb 6.4 oz (97.3 kg)   SpO2 93%   BMI  29.08 kg/m?  ? ?Outpatient Encounter Medications as of 07/13/2021  ?Medication Sig  ? acetaminophen (TYLENOL) 325 MG tablet Take 650 mg by mouth every 6 (six) hours as needed for mild pain.  ? albuterol (VENTOLIN HFA) 108 (90 Base) MCG/ACT inhaler Inhale 2 puffs into the lungs every 6 (six) hours as needed for wheezing or shortness of breath.  ? ALPRAZolam (XANAX) 1 MG tablet Take 1 mg by mouth at bedtime. Every morning as needed for anxiety, nerves, and behavior  ? Amino Acids-Protein Hydrolys (FEEDING SUPPLEMENT, PRO-STAT SUGAR FREE 64,) LIQD Take 30 mLs by mouth 3 (three) times daily with meals. Special Instructions: for albumin 2.8  ? aspirin EC 81 MG tablet Take 81 mg by mouth daily. Swallow whole.  ? Cholecalciferol (VITAMIN D3) 50 MCG (2000 UT) capsule Take 2,000 Units by mouth daily.  ? dronabinol (MARINOL) 2.5 MG capsule Take 1 capsule (2.5 mg total) by mouth daily before lunch.  ?  enoxaparin (LOVENOX) 40 MG/0.4ML injection Inject 40 mg into the skin daily. for d-dimer 3.48  ? Heparin Sodium, Porcine, (HEPARIN LOCK FLUSH IJ) syringe; 10 unit/mL; amt: 10 units/mL; intravenous; Twice A Day ?Special Instructions: Flush port line after each use.  ? lidocaine-prilocaine (EMLA) cream Apply 1 application topically as directed. Apply 1/2 tablespoon to port-a-cath site 2 hours prior to stick and cover with plastic wrap to numb site  ? loperamide (IMODIUM) 2 MG capsule Take 2 mg by mouth every 8 (eight) hours as needed for diarrhea or loose stools.  ? loperamide (IMODIUM) 2 MG capsule Take 1 capsule (2 mg total) by mouth 2 (two) times daily.  ? magnesium oxide (MAG-OX) 400 (240 Mg) MG tablet TAKE ONE TABLET TWICE DAILY  ? melatonin 3 MG TABS tablet Take 3 mg by mouth at bedtime.  ? mirtazapine (REMERON) 45 MG tablet Take 45 mg by mouth at bedtime.  ? Needle, Disp, (HUBER NEEDLE 22GX3/4") 22G X 3/4" MISC 22 x 3/4 "; miscellaneous,Once A Day on Mon ?Special Instructions: change weekly  ? nitrofurantoin, macrocrystal-monohydrate, (MACROBID) 100 MG capsule Take 100 mg by mouth 2 (two) times daily. Enterobacter cloacae UTI  ? NON FORMULARY Diet:Regular Diet with Thin Liquids  ? Nutritional Supplements (ENSURE ENLIVE PO) Take by mouth. 120 ml with medpass to protect muscle mass and help meet increased proten/energy needs ?Twice A Day Between Meals  ? omeprazole (PRILOSEC) 20 MG capsule Take 1 capsule (20 mg total) 2 (two) times daily by mouth.  ? ondansetron (ZOFRAN) 8 MG tablet Take 1 tablet (8 mg total) by mouth every 8 (eight) hours as needed for nausea or vomiting.  ? ondansetron (ZOFRAN-ODT) 8 MG disintegrating tablet Take 8 mg by mouth every 8 (eight) hours as needed for nausea or vomiting.  ? Ostomy Supplies (STOMAHESIVE PROTECTIVE) POWD (ostomy supplies) [OTC]  ?powder; - ; topical Every Shift - PRN ?Special Instructions: Apply to stoma periwound qshift prn irritation or excoriation.  ? polycarbophil  (FIBERCON) 625 MG tablet Take 1 tablet (625 mg total) by mouth 2 (two) times daily.  ? potassium chloride SA (KLOR-CON M) 20 MEQ tablet Take 20 mEq by mouth 2 (two) times daily.  ? pravastatin (PRAVACHOL) 40 MG tablet Take 40 mg by mouth daily.  ? propranolol (INNOPRAN XL) 80 MG 24 hr capsule Take 160 mg by mouth daily.  ? risperiDONE (RISPERDAL) 0.25 MG tablet Take 0.125 mg by mouth at bedtime.  ? sodium chloride 0.9 % injection Give N/S @ 150cc/hr. x 8 hours each night. ?[DX:  Ileostomy status] ?Once A Day  ? SUMAtriptan (IMITREX) 100 MG tablet Take 100 mg by mouth 2 (two) times daily as needed for migraine.  ? ?No facility-administered encounter medications on file as of 07/13/2021.  ? ? ? ?SIGNIFICANT DIAGNOSTIC EXAMS ? ?PREVIOUS  ? ?04-01-21: 2-d echo:  ?1. There is severe asymmetric hypertrophy of the apical segments (up to  18 mm) consistent with apical variant hypertrophic cardiomyopathy. There  is concern for an apical aneurysm on this study, however this was a  difficult study. Would recommend cardiac  MRI for better characterization. Left ventricular ejection fraction, by  estimation, is 55 to 60%. The left ventricle has normal function. The left ventricle has no regional wall motion abnormalities. There is severe  asymmetric left ventricular  hypertrophy of the apical segment. Left ventricular diastolic parameters  are consistent with Grade I diastolic dysfunction (impaired relaxation).  ? ?05-29-21: renal ultrasound ?Redemonstration of right renal cyst without evidence of superimposed hydronephrosis. Greatest dimension estimated 8.2 cm. ?Left renal cortex not well evaluated and is atrophic/replaced by chronic cystic change/hydronephrosis in the setting of chronic UPJ obstruction described on prior CT. ? ?NO NEW EXAMS.  ? ?LABS REVIEWED;  ? ?05-14-21: hgb a1c 7.2 ?05-28-21: wbc 9.7; hgb 13.5; hct 38.8; mcv 97.5 plt 124; glucose 193; bun 28; creat 1.74; k+ 4.4; na++ 134; ca 7.8; GFR 43; protein 5.8; albumin 3.1;  mag 1.5 ?06-01-21: glucose 134; bun 28; creat 1.69; k+ 3.7; na++ 136; ca 7.7; GFR 44 ?06-09-21: wbc 7.4; hgb 8.5; hct 26.8; mcv 100.8 plt 399; glucose 145; bun 19; creat 1.43; k+ 3.8; na++ 135; ca 8.3; GFR 54; crp 10.7

## 2021-07-14 DIAGNOSIS — R2681 Unsteadiness on feet: Secondary | ICD-10-CM | POA: Diagnosis not present

## 2021-07-14 DIAGNOSIS — R1312 Dysphagia, oropharyngeal phase: Secondary | ICD-10-CM | POA: Diagnosis not present

## 2021-07-14 DIAGNOSIS — R279 Unspecified lack of coordination: Secondary | ICD-10-CM | POA: Diagnosis not present

## 2021-07-14 DIAGNOSIS — Z741 Need for assistance with personal care: Secondary | ICD-10-CM | POA: Diagnosis not present

## 2021-07-14 DIAGNOSIS — U071 COVID-19: Secondary | ICD-10-CM | POA: Diagnosis not present

## 2021-07-14 DIAGNOSIS — M6281 Muscle weakness (generalized): Secondary | ICD-10-CM | POA: Diagnosis not present

## 2021-07-14 DIAGNOSIS — C20 Malignant neoplasm of rectum: Secondary | ICD-10-CM | POA: Diagnosis not present

## 2021-07-14 DIAGNOSIS — R262 Difficulty in walking, not elsewhere classified: Secondary | ICD-10-CM | POA: Diagnosis not present

## 2021-07-16 ENCOUNTER — Non-Acute Institutional Stay (SKILLED_NURSING_FACILITY): Payer: Medicare Other | Admitting: Adult Health

## 2021-07-16 ENCOUNTER — Other Ambulatory Visit (HOSPITAL_COMMUNITY)
Admission: RE | Admit: 2021-07-16 | Discharge: 2021-07-16 | Disposition: A | Discharge status: Home / Self Care | Payer: Medicare Other | Source: Skilled Nursing Facility | Attending: Adult Health | Admitting: Adult Health

## 2021-07-16 ENCOUNTER — Encounter: Payer: Self-pay | Admitting: Adult Health

## 2021-07-16 DIAGNOSIS — D6869 Other thrombophilia: Secondary | ICD-10-CM

## 2021-07-16 DIAGNOSIS — U071 COVID-19: Secondary | ICD-10-CM

## 2021-07-16 LAB — D-DIMER, QUANTITATIVE: D-Dimer, Quant: 1.57 ug/mL-FEU — ABNORMAL HIGH (ref 0.00–0.50)

## 2021-07-16 NOTE — Progress Notes (Signed)
?Location:  [facility] Room Number: 124/P ?Place of Service:  SNF (31) ? ? ?CODE STATUS: FULL ? ?Allergies  ?Allergen Reactions  ? Nsaids Other (See Comments)  ?  Chronic kidney disease - avoid NSAIDs  ? ? ?Chief Complaint  ?Patient presents with  ? Acute Visit  ?  Patient is here for F/U on Labs  ? ? ?HPI: ? ?He has hypercoagulable state due to covid 19. There are no indications of abnormal clotting present. He is presently taking lovenox daily . His d-dimer is lower at 1.57. will need to continue this medication at this time.   ? ?Past Medical History:  ?Diagnosis Date  ? Back pain   ? Bipolar disorder ([place])   ? bipolar 1  ? CKD (chronic kidney disease) stage 2, GFR 60-89 ml/min 09/24/2020  ? Hypertension   ? Knee pain   ? Migraine   ? Rectal cancer ([place])   ? Type 2 diabetes mellitus with complication, without long-term current use of insulin ([place]) 03/19/2021  ? ? ?Past Surgical History:  ?Procedure Laterality Date  ? BACK SURGERY    ? JOINT REPLACEMENT Right   ? knee  ? OSTOMY N/A 05/27/2021  ? Procedure: POSSIBLE OSTOMY;  Surgeon: Michael Boston, MD;  [facility];  Service: General;  Laterality: N/A;  ? PORTACATH PLACEMENT N/A 10/02/2020  ? Procedure: PORT-A-CATH PLACEMENT;  Surgeon: Dwan Bolt, MD;  Location: [facility];  Service: General;  Laterality: N/A;  ? PROCTOSCOPY N/A 05/27/2021  ? Procedure: RIGID PROCTOSCOPY;  Surgeon: Michael Boston, MD;  [facility]: [facility];  Service: General;  Laterality: N/A;  ? XI ROBOTIC ASSISTED LOWER ANTERIOR RESECTION N/A 05/27/2021  ? Procedure: ROBOTIC LOW ANTERIOR COLON RESECTION, VERY LOW COLOANAL ANASTOMOSIS, DIVERTING ILEOSTOMY, BILATERAL TAP BLOCK,  FIRFLY INJECTION;  Surgeon: Michael Boston, MD;  [facility]: [facility];  Service: General;  Laterality: N/A;  ? ? ?Social History  ? ?Socioeconomic History  ? Marital status: Divorced  ?  Spouse name: Not on file  ? Number of children: Not on file  ? Years of education: Not on file  ? Highest education level:  Not on file  ?Occupational History  ? Not on file  ?Tobacco Use  ? Smoking status: Former  ?  Packs/day: 0.50  ?  Years: 45.00  ?  Pack years: 22.50  ?  Types: Cigarettes  ?  Quit date: 04/22/2021  ?  Years since quitting: 0.2  ? Smokeless tobacco: Never  ?Vaping Use  ? Vaping Use: Never used  ?Substance and Sexual Activity  ? Alcohol use: No  ? Drug use: No  ? Sexual activity: Not on file  ?Other Topics Concern  ? Not on file  ?Social History Narrative  ? Not on file  ? ?Social Determinants of Health  ? ?Financial Resource Strain: Not on file  ?Food Insecurity: Not on file  ?Transportation Needs: Not on file  ?Physical Activity: Not on file  ?Stress: Not on file  ?Social Connections: Not on file  ?Intimate Partner Violence: Not on file  ? ?Family History  ?Problem Relation Age of Onset  ? ALS Mother   ? Heart disease Mother   ? Parkinson's disease Mother   ? Heart failure Father   ? ? ? ? ?VITAL SIGNS ?There were no vitals taken for this visit. ? ?Outpatient Encounter Medications as of 07/16/2021  ?Medication Sig  ? acetaminophen (TYLENOL) 325 MG tablet Take 650 mg by mouth every 6 (six) hours  as needed for mild pain.  ? albuterol (VENTOLIN HFA) 108 (90 Base) MCG/ACT inhaler Inhale 2 puffs into the lungs every 6 (six) hours as needed for wheezing or shortness of breath.  ? ALPRAZolam (XANAX) 1 MG tablet Take 1 mg by mouth at bedtime. Every morning as needed for anxiety, nerves, and behavior  ? Amino Acids-Protein Hydrolys (FEEDING SUPPLEMENT, PRO-STAT SUGAR FREE 64,) LIQD Take 30 mLs by mouth 3 (three) times daily with meals. Special Instructions: for albumin 2.8  ? aspirin EC 81 MG tablet Take 81 mg by mouth daily. Swallow whole.  ? Cholecalciferol (VITAMIN D3) 50 MCG (2000 UT) capsule Take 2,000 Units by mouth daily.  ? dronabinol (MARINOL) 2.5 MG capsule Take 1 capsule (2.5 mg total) by mouth daily before lunch.  ? enoxaparin (LOVENOX) 40 MG/0.4ML injection Inject 40 mg into the skin daily. for d-dimer 3.48  ?  Heparin Sodium, Porcine, (HEPARIN LOCK FLUSH IJ) syringe; 10 unit/mL; amt: 10 units/mL; intravenous; Twice A Day ?Special Instructions: Flush port line after each use.  ? lidocaine-prilocaine (EMLA) cream Apply 1 application topically as directed. Apply 1/2 tablespoon to port-a-cath site 2 hours prior to stick and cover with plastic wrap to numb site  ? loperamide (IMODIUM) 2 MG capsule Take 2 mg by mouth every 8 (eight) hours as needed for diarrhea or loose stools.  ? loperamide (IMODIUM) 2 MG capsule Take 1 capsule (2 mg total) by mouth 2 (two) times daily.  ? magnesium oxide (MAG-OX) 400 (240 Mg) MG tablet TAKE ONE TABLET TWICE DAILY  ? melatonin 3 MG TABS tablet Take 3 mg by mouth at bedtime.  ? mirtazapine (REMERON) 45 MG tablet Take 45 mg by mouth at bedtime.  ? Needle, Disp, (HUBER NEEDLE 22GX3/4") 22G X 3/4" MISC 22 x 3/4 "; miscellaneous,Once A Day on Mon ?Special Instructions: change weekly  ? nitrofurantoin, macrocrystal-monohydrate, (MACROBID) 100 MG capsule Take 100 mg by mouth 2 (two) times daily. Enterobacter cloacae UTI  ? NON FORMULARY Diet:Regular Diet with Thin Liquids  ? Nutritional Supplements (ENSURE ENLIVE PO) Take by mouth. 120 ml with medpass to protect muscle mass and help meet increased proten/energy needs ?Twice A Day Between Meals  ? omeprazole (PRILOSEC) 20 MG capsule Take 1 capsule (20 mg total) 2 (two) times daily by mouth.  ? ondansetron (ZOFRAN) 8 MG tablet Take 1 tablet (8 mg total) by mouth every 8 (eight) hours as needed for nausea or vomiting.  ? ondansetron (ZOFRAN-ODT) 8 MG disintegrating tablet Take 8 mg by mouth every 8 (eight) hours as needed for nausea or vomiting.  ? Ostomy Supplies (STOMAHESIVE PROTECTIVE) POWD (ostomy supplies) [OTC]  ?powder; - ; topical Every Shift - PRN ?Special Instructions: Apply to stoma periwound qshift prn irritation or excoriation.  ? polycarbophil (FIBERCON) 625 MG tablet Take 1 tablet (625 mg total) by mouth 2 (two) times daily.  ? potassium  chloride SA (KLOR-CON M) 20 MEQ tablet Take 20 mEq by mouth 2 (two) times daily.  ? pravastatin (PRAVACHOL) 40 MG tablet Take 40 mg by mouth daily.  ? propranolol (INNOPRAN XL) 80 MG 24 hr capsule Take 160 mg by mouth daily.  ? risperiDONE (RISPERDAL) 0.25 MG tablet Take 0.125 mg by mouth at bedtime.  ? sodium chloride 0.9 % injection Give N/S @ 150cc/hr. x 8 hours each night. ?[DX: Ileostomy status] ?Once A Day  ? SUMAtriptan (IMITREX) 100 MG tablet Take 100 mg by mouth 2 (two) times daily as needed for migraine.  ? ?No facility-administered  encounter medications on file as of 07/16/2021.  ? ? ? ?SIGNIFICANT DIAGNOSTIC EXAMS ? ?PREVIOUS  ? ?04-01-21: 2-d echo:  ?1. There is severe asymmetric hypertrophy of the apical segments (up to  18 mm) consistent with apical variant hypertrophic cardiomyopathy. There  is concern for an apical aneurysm on this study, however this was a  difficult study. Would recommend cardiac  MRI for better characterization. Left ventricular ejection fraction, by  estimation, is 55 to 60%. The left ventricle has normal function. The left ventricle has no regional wall motion abnormalities. There is severe  asymmetric left ventricular  hypertrophy of the apical segment. Left ventricular diastolic parameters  are consistent with Grade I diastolic dysfunction (impaired relaxation).  ? ?05-29-21: renal ultrasound ?Redemonstration of right renal cyst without evidence of superimposed hydronephrosis. Greatest dimension estimated 8.2 cm. ?Left renal cortex not well evaluated and is atrophic/replaced by chronic cystic change/hydronephrosis in the setting of chronic UPJ obstruction described on prior CT. ? ?NO NEW EXAMS.  ? ?LABS REVIEWED;  ? ?05-14-21: hgb a1c 7.2 ?05-28-21: wbc 9.7; hgb 13.5; hct 38.8; mcv 97.5 plt 124; glucose 193; bun 28; creat 1.74; k+ 4.4; na++ 134; ca 7.8; GFR 43; protein 5.8; albumin 3.1; mag 1.5 ?06-01-21: glucose 134; bun 28; creat 1.69; k+ 3.7; na++ 136; ca 7.7; GFR 44 ?06-09-21: wbc  7.4; hgb 8.5; hct 26.8; mcv 100.8 plt 399; glucose 145; bun 19; creat 1.43; k+ 3.8; na++ 135; ca 8.3; GFR 54; crp 10.7; d-dimer 7.64  ?06-15-21: mag 2.0 ?06-18-21: CRP 1.0; d-dimer 2.81  ?06-21-21: glucose

## 2021-07-20 ENCOUNTER — Ambulatory Visit (HOSPITAL_COMMUNITY)
Admission: RE | Admit: 2021-07-20 | Discharge: 2021-07-20 | Disposition: A | Discharge status: Home / Self Care | Payer: Medicare Other | Source: Ambulatory Visit | Attending: Nurse Practitioner | Admitting: Nurse Practitioner

## 2021-07-20 DIAGNOSIS — Z960 Presence of urogenital implants: Secondary | ICD-10-CM | POA: Diagnosis not present

## 2021-07-20 DIAGNOSIS — C20 Malignant neoplasm of rectum: Secondary | ICD-10-CM | POA: Insufficient documentation

## 2021-07-20 DIAGNOSIS — R198 Other specified symptoms and signs involving the digestive system and abdomen: Secondary | ICD-10-CM | POA: Diagnosis not present

## 2021-07-20 DIAGNOSIS — Z432 Encounter for attention to ileostomy: Secondary | ICD-10-CM | POA: Insufficient documentation

## 2021-07-20 DIAGNOSIS — N390 Urinary tract infection, site not specified: Secondary | ICD-10-CM | POA: Diagnosis not present

## 2021-07-20 DIAGNOSIS — Z932 Ileostomy status: Secondary | ICD-10-CM

## 2021-07-20 DIAGNOSIS — L24B3 Irritant contact dermatitis related to fecal or urinary stoma or fistula: Secondary | ICD-10-CM | POA: Diagnosis not present

## 2021-07-20 NOTE — Discharge Instructions (Signed)
Emptied 320 ml urine from leg bag ?Changed ileostomy pouch ?Emptied 200 ml liquid stool from pouch ?We used stoma powder and skin prep to peristomal skin ?Used barrier ring ?Pouch opening 1 3/4"  and added ostomy belt.  ?Contact dermatitis, intact but red around stoma ? Empty twice weekly ?Patient applied ring, cut barrier and applied pouch.  Assisted with belt.   ?

## 2021-07-20 NOTE — Progress Notes (Signed)
[facility]  ? ?Reason for visit:  ?RLQ ileostomy ?HPI:  ?Rectal cancer with loop ileostomy ?Foley catheter with leg bag.  Currently treating for UTI  Urine is yellow and clear today. ?High output ileostomy and he states that he is getting IV fluids at night. His mouth is moist, no dizziness today ?ROS  ?Review of Systems  ?Genitourinary:   ?     Currently has foley with leg bag in place.  ?Clear yellow urine  ?Skin:  Positive for color change.  ?     Red peristomal skin  ?All other systems reviewed and are negative. ?Vital signs:  ?There were no vitals taken for this visit. ?Exam:  ?Physical Exam ?Constitutional:   ?   Appearance: Normal appearance.  ?HENT:  ?   Mouth/Throat:  ?   Mouth: Mucous membranes are moist.  ?Abdominal:  ?   Palpations: Abdomen is soft.  ?Neurological:  ?   Mental Status: He is alert and oriented to person, place, and time.  ?Psychiatric:     ?   Mood and Affect: Mood normal.     ?   Behavior: Behavior normal.  ?  ?Stoma type/location:  RLQ loop ileostomy ?Stomal assessment/size:  1 3/4" red and moist ?Peristomal assessment:  skin is red and intact ?Treatment options for stomal/peristomal skin: stoma powder and skin prep  barrier ring ?Output: liquid brown stool ?Ostomy pouching: 2 pc  2 3/4" pouch with barrier ring ?Education provided:  patient performed pouch change today with minimal assistance. He hopes to go home soon.  He lives alone but is here with his son today who lives close and will help as needed.  ? ?  ?Impression/dx  ?High output ileostomy ?UTI/foley in place ?Discussion  ?Sees urology this week for follow up ?Plan  ?Emptied foley leg bag and recorded output for SNF records  (320 ml urine) ?Emptied ostomy pouch (240m liquid stool)  ?For accurate I & O at facility ? ? ? ?Visit time: 60 minutes.  ? ?KDomenic MorasFNP-BC ? ?  ?

## 2021-07-21 ENCOUNTER — Other Ambulatory Visit (HOSPITAL_COMMUNITY): Payer: Self-pay | Admitting: Nurse Practitioner

## 2021-07-21 DIAGNOSIS — Z432 Encounter for attention to ileostomy: Secondary | ICD-10-CM

## 2021-07-21 DIAGNOSIS — L24B3 Irritant contact dermatitis related to fecal or urinary stoma or fistula: Secondary | ICD-10-CM

## 2021-07-23 DIAGNOSIS — N130 Hydronephrosis with ureteropelvic junction obstruction: Secondary | ICD-10-CM | POA: Diagnosis not present

## 2021-07-23 DIAGNOSIS — R338 Other retention of urine: Secondary | ICD-10-CM | POA: Diagnosis not present

## 2021-07-27 ENCOUNTER — Ambulatory Visit: Payer: Medicare Other | Admitting: Cardiology

## 2021-07-30 ENCOUNTER — Other Ambulatory Visit (HOSPITAL_COMMUNITY)
Admission: RE | Admit: 2021-07-30 | Discharge: 2021-07-30 | Disposition: A | Discharge status: Home / Self Care | Payer: Medicare Other | Source: Skilled Nursing Facility | Attending: Adult Health | Admitting: Adult Health

## 2021-07-30 ENCOUNTER — Encounter: Payer: Self-pay | Admitting: Adult Health

## 2021-07-30 ENCOUNTER — Non-Acute Institutional Stay (SKILLED_NURSING_FACILITY): Payer: Medicare Other | Admitting: Adult Health

## 2021-07-30 DIAGNOSIS — I129 Hypertensive chronic kidney disease with stage 1 through stage 4 chronic kidney disease, or unspecified chronic kidney disease: Secondary | ICD-10-CM | POA: Insufficient documentation

## 2021-07-30 DIAGNOSIS — Z932 Ileostomy status: Secondary | ICD-10-CM | POA: Diagnosis not present

## 2021-07-30 DIAGNOSIS — R198 Other specified symptoms and signs involving the digestive system and abdomen: Secondary | ICD-10-CM

## 2021-07-30 LAB — BASIC METABOLIC PANEL
Anion gap: 5 (ref 5–15)
BUN: 12 mg/dL (ref 8–23)
CO2: 27 mmol/L (ref 22–32)
Calcium: 8.2 mg/dL — ABNORMAL LOW (ref 8.9–10.3)
Chloride: 104 mmol/L (ref 98–111)
Creatinine, Ser: 1.36 mg/dL — ABNORMAL HIGH (ref 0.61–1.24)
GFR, Estimated: 57 mL/min — ABNORMAL LOW (ref >60)
Glucose, Bld: 117 mg/dL — ABNORMAL HIGH (ref 70–99)
Potassium: 3.8 mmol/L (ref 3.5–5.1)
Sodium: 136 mmol/L (ref 135–145)

## 2021-07-30 NOTE — Progress Notes (Signed)
Location:  [place] Room Number: 124 P Place of Service:  SNF (31) Provider:  Ok Edwards, NP   CODE STATUS: FULL CODE  Allergies  Allergen Reactions   Nsaids Other (See Comments)    Chronic kidney disease - avoid NSAIDs    Chief Complaint  Patient presents with   Acute Visit    High out put ileostomy    HPI:  He continues to receive IVF at night. The volume of his output is slowing down. The consistency remains liquid. His appetite is good. He does take adequate fluids. He is wanting to stop the IVF and see how his kidneys do.   Past Medical History:  Diagnosis Date   Back pain    Bipolar disorder ([place])    bipolar 1   CKD (chronic kidney disease) stage 2, GFR 60-89 ml/min 09/24/2020   Hypertension    Knee pain    Migraine    Rectal cancer ([place])    Type 2 diabetes mellitus with complication, without long-term current use of insulin ([place]) 03/19/2021    Past Surgical History:  Procedure Laterality Date   BACK SURGERY     JOINT REPLACEMENT Right    knee   OSTOMY N/A 05/27/2021   Procedure: POSSIBLE OSTOMY;  Surgeon: Michael Boston, MD;  Location: [facility];  Service: General;  Laterality: N/A;   PORTACATH PLACEMENT N/A 10/02/2020   Procedure: PORT-A-CATH PLACEMENT;  Surgeon: Dwan Bolt, MD;  Location: [facility];  Service: General;  Laterality: N/A;   PROCTOSCOPY N/A 05/27/2021   Procedure: RIGID PROCTOSCOPY;  Surgeon: Michael Boston, MD;  Location: [facility];  Service: General;  Laterality: N/A;   XI ROBOTIC ASSISTED LOWER ANTERIOR RESECTION N/A 05/27/2021   Procedure: ROBOTIC LOW ANTERIOR COLON RESECTION, VERY LOW COLOANAL ANASTOMOSIS, DIVERTING ILEOSTOMY, BILATERAL TAP BLOCK,  FIRFLY INJECTION;  Surgeon: Michael Boston, MD;  Location: [facility];  Service: General;  Laterality: N/A;    Social History   Socioeconomic History   Marital status: Divorced    Spouse name: Not on file   Number of children: Not on file   Years of education: Not on file    Highest education level: Not on file  Occupational History   Not on file  Tobacco Use   Smoking status: Former    Packs/day: 0.50    Years: 45.00    Pack years: 22.50    Types: Cigarettes    Quit date: 04/22/2021    Years since quitting: 0.2   Smokeless tobacco: Never  Vaping Use   Vaping Use: Never used  Substance and Sexual Activity   Alcohol use: No   Drug use: No   Sexual activity: Not on file  Other Topics Concern   Not on file  Social History Narrative   Not on file   Social Determinants of Health   Financial Resource Strain: Not on file  Food Insecurity: Not on file  Transportation Needs: Not on file  Physical Activity: Not on file  Stress: Not on file  Social Connections: Not on file  Intimate Partner Violence: Not on file   Family History  Problem Relation Age of Onset   ALS Mother    Heart disease Mother    Parkinson's disease Mother    Heart failure Father       VITAL SIGNS Temp (!) 97.4 F (36.3 C)   Ht 6' (1.829 m)   Wt 214 lb 6.4 oz (97.3 kg)   BMI 29.08 kg/m  Outpatient Encounter Medications as of 07/30/2021  Medication Sig   acetaminophen (TYLENOL) 325 MG tablet Take 650 mg by mouth every 6 (six) hours as needed for mild pain.   albuterol (VENTOLIN HFA) 108 (90 Base) MCG/ACT inhaler Inhale 2 puffs into the lungs every 6 (six) hours as needed for wheezing or shortness of breath.   ALPRAZolam (XANAX) 1 MG tablet Take 1 mg by mouth at bedtime. Every morning as needed for anxiety, nerves, and behavior   Amino Acids-Protein Hydrolys (FEEDING SUPPLEMENT, PRO-STAT SUGAR FREE 64,) LIQD Take 30 mLs by mouth 3 (three) times daily with meals. Special Instructions: for albumin 2.8   aspirin EC 81 MG tablet Take 81 mg by mouth daily. Swallow whole.   Cholecalciferol (VITAMIN D3) 50 MCG (2000 UT) capsule Take 2,000 Units by mouth daily.   dronabinol (MARINOL) 2.5 MG capsule Take 1 capsule (2.5 mg total) by mouth daily before lunch.   Heparin Sodium,  Porcine, (HEPARIN LOCK FLUSH IJ) syringe; 10 unit/mL; amt: 10 units/mL; intravenous; Twice A Day Special Instructions: Flush port line after each use.   lidocaine-prilocaine (EMLA) cream Apply 1 application topically as directed. Apply 1/2 tablespoon to port-a-cath site 2 hours prior to stick and cover with plastic wrap to numb site   loperamide (IMODIUM) 2 MG capsule Take 2 mg by mouth every 8 (eight) hours as needed for diarrhea or loose stools.   loperamide (IMODIUM) 2 MG capsule Take 1 capsule (2 mg total) by mouth 2 (two) times daily.   magnesium oxide (MAG-OX) 400 (240 Mg) MG tablet TAKE ONE TABLET TWICE DAILY   melatonin 3 MG TABS tablet Take 3 mg by mouth at bedtime.   mirtazapine (REMERON) 45 MG tablet Take 45 mg by mouth at bedtime.   Needle, Disp, (HUBER NEEDLE 22GX3/4") 22G X 3/4" MISC 22 x 3/4 "; miscellaneous,Once A Day on Mon Special Instructions: change weekly   NON FORMULARY Diet:Regular Diet with Thin Liquids   Nutritional Supplements (ENSURE ENLIVE PO) Take by mouth. 120 ml with medpass to protect muscle mass and help meet increased proten/energy needs Twice A Day Between Meals   omeprazole (PRILOSEC) 20 MG capsule Take 1 capsule (20 mg total) 2 (two) times daily by mouth.   ondansetron (ZOFRAN) 8 MG tablet Take 1 tablet (8 mg total) by mouth every 8 (eight) hours as needed for nausea or vomiting.   ondansetron (ZOFRAN-ODT) 8 MG disintegrating tablet Take 8 mg by mouth every 8 (eight) hours as needed for nausea or vomiting.   Ostomy Supplies (STOMAHESIVE PROTECTIVE) POWD (ostomy supplies) [OTC]  powder; - ; topical Every Shift - PRN Special Instructions: Apply to stoma periwound qshift prn irritation or excoriation.   polycarbophil (FIBERCON) 625 MG tablet Take 1 tablet (625 mg total) by mouth 2 (two) times daily.   potassium chloride SA (KLOR-CON M) 20 MEQ tablet Take 20 mEq by mouth 2 (two) times daily.   pravastatin (PRAVACHOL) 40 MG tablet Take 40 mg by mouth daily.    propranolol (INNOPRAN XL) 80 MG 24 hr capsule Take 160 mg by mouth daily.   risperiDONE (RISPERDAL) 0.25 MG tablet Take 0.125 mg by mouth at bedtime.   sodium chloride 0.9 % injection Give N/S @ 150cc/hr. x 8 hours each night. [DX: Ileostomy status] Once A Day   SUMAtriptan (IMITREX) 100 MG tablet Take 100 mg by mouth 2 (two) times daily as needed for migraine.   tamsulosin (FLOMAX) 0.4 MG CAPS capsule Take 0.4 mg by mouth at bedtime.   [  DISCONTINUED] enoxaparin (LOVENOX) 40 MG/0.4ML injection Inject 40 mg into the skin daily. for d-dimer 3.48   [DISCONTINUED] nitrofurantoin, macrocrystal-monohydrate, (MACROBID) 100 MG capsule Take 100 mg by mouth 2 (two) times daily. Enterobacter cloacae UTI   No facility-administered encounter medications on file as of 07/30/2021.     SIGNIFICANT DIAGNOSTIC EXAMS   PREVIOUS   04-01-21: 2-d echo:  1. There is severe asymmetric hypertrophy of the apical segments (up to  18 mm) consistent with apical variant hypertrophic cardiomyopathy. There  is concern for an apical aneurysm on this study, however this was a  difficult study. Would recommend cardiac  MRI for better characterization. Left ventricular ejection fraction, by  estimation, is 55 to 60%. The left ventricle has normal function. The left ventricle has no regional wall motion abnormalities. There is severe  asymmetric left ventricular  hypertrophy of the apical segment. Left ventricular diastolic parameters  are consistent with Grade I diastolic dysfunction (impaired relaxation).   05-29-21: renal ultrasound Redemonstration of right renal cyst without evidence of superimposed hydronephrosis. Greatest dimension estimated 8.2 cm. Left renal cortex not well evaluated and is atrophic/replaced by chronic cystic change/hydronephrosis in the setting of chronic UPJ obstruction described on prior CT.  NO NEW EXAMS.   LABS REVIEWED;   05-14-21: hgb a1c 7.2 05-28-21: wbc 9.7; hgb 13.5; hct 38.8; mcv 97.5 plt  124; glucose 193; bun 28; creat 1.74; k+ 4.4; na++ 134; ca 7.8; GFR 43; protein 5.8; albumin 3.1; mag 1.5 06-01-21: glucose 134; bun 28; creat 1.69; k+ 3.7; na++ 136; ca 7.7; GFR 44 06-09-21: wbc 7.4; hgb 8.5; hct 26.8; mcv 100.8 plt 399; glucose 145; bun 19; creat 1.43; k+ 3.8; na++ 135; ca 8.3; GFR 54; crp 10.7; d-dimer 7.64  06-15-21: mag 2.0 06-18-21: CRP 1.0; d-dimer 2.81  06-21-21: glucose 159; bun 72; creat 5.93; k+ 5.9; na++ 130; ca 8.9; GFR 10 06-22-21: wbc 6.6; hgb 10.2; hct 31.8; mcv 97.8 plt 175; glucose 165; bun 70; creat 5.39; k+ 5.8; na++ 132; ca 8.7; GFR 11 d-dimer 1.77  06-23-21: glucose 191; bun 54; creat 3.12; k+ 5.2; na++ 136; ca 8.9; GFR 21; protein 6.2 albumin 2.8 06-24-21: glucose 120; bun 47; creat 2.40; k+ 6.0 ;na++ 140; ca 8.8; GFR 29 06-25-21: glucose 119; bun 45; creat 2.91; k+ 5.2; na++ 138; ca 8.4; GFR 23; lipase 31; d-dimer 2.02   06-26-21: glucose 116; bun 38; creat 2.49; k+ 4.2; na++ 142; ca 8.4; GFR 28; protein 5.7; albumin 2.6 06-29-21: wbc 4.9; hgb 7.9; hct 24.9; mcv 98.0 plt 115; glucose 150; bun 19; creat 1.57; k+ 3.8 na++ 140; ca 7.8; GFR 48  06-30-21: hgb 8.6; hct 26.5; glucose 102; bun 13; creat 1.45; k+ 3.6; na++ 140; ca 7.8; GFR 53  07-01-21: wbc 4.4; hgb 8.2; hct 25.8; mcv 97.7 plt 93; glucose 115; bun 9; creat 1.32; k+ 3.8; na++ 140; ca 7.5; GFR 59; d-dimer 3.48 hepatic panel normal  07-06-21: wbc 4.7; hgb 8.9; hct 28.1 mcv 97.6 plt 188; glucose 114; bun 8; creat 1.33; k+ 3.4; na++ 138; ca 8.2; GFR 59 protein 6.0 albumin 2.5   07-10-21: urine culture: enterobacter cloacae: macrobid 07-16-21: d-dimer 1.57  NO NEW LABS.   Review of Systems  Constitutional:  Negative for malaise/fatigue.  Respiratory:  Negative for cough and shortness of breath.   Cardiovascular:  Negative for chest pain, palpitations and leg swelling.  Gastrointestinal:  Negative for abdominal pain, constipation and heartburn.  Musculoskeletal:  Negative for back pain, joint pain and myalgias.  Skin:  Negative.   Neurological:  Negative for dizziness.  Psychiatric/Behavioral:  The patient is not nervous/anxious.    Physical Exam Constitutional:      General: He is not in acute distress.    Appearance: He is well-developed and overweight. He is not diaphoretic.  Neck:     Thyroid: No thyromegaly.  Cardiovascular:     Rate and Rhythm: Normal rate and regular rhythm.     Pulses: Normal pulses.     Heart sounds: Normal heart sounds.  Pulmonary:     Effort: Pulmonary effort is normal. No respiratory distress.     Breath sounds: Normal breath sounds.  Abdominal:     General: Bowel sounds are normal. There is no distension.     Palpations: Abdomen is soft.     Tenderness: There is no abdominal tenderness.     Comments: Ileostomy: liquid stool   Musculoskeletal:        General: Normal range of motion.     Cervical back: Neck supple.  Lymphadenopathy:     Cervical: No cervical adenopathy.  Skin:    General: Skin is warm and dry.  Neurological:     Mental Status: He is alert and oriented to person, place, and time.  Psychiatric:        Mood and Affect: Mood normal.     ASSESSMENT/ PLAN:  TODAY  High output ileostomy: will stop IVF; will begin questran 4 gm twice daily will repeat BMP.   Ok Edwards NP [facility]  call [phone]

## 2021-07-31 ENCOUNTER — Non-Acute Institutional Stay (SKILLED_NURSING_FACILITY): Payer: Medicare Other | Admitting: Adult Health

## 2021-07-31 ENCOUNTER — Encounter: Payer: Self-pay | Admitting: Adult Health

## 2021-07-31 DIAGNOSIS — R198 Other specified symptoms and signs involving the digestive system and abdomen: Secondary | ICD-10-CM | POA: Diagnosis not present

## 2021-07-31 DIAGNOSIS — C20 Malignant neoplasm of rectum: Secondary | ICD-10-CM

## 2021-07-31 DIAGNOSIS — I700 Atherosclerosis of aorta: Secondary | ICD-10-CM | POA: Diagnosis not present

## 2021-07-31 DIAGNOSIS — Z932 Ileostomy status: Secondary | ICD-10-CM

## 2021-07-31 NOTE — Progress Notes (Signed)
Location:  [place] Room Number: 124-P Place of Service:  SNF (31)   CODE STATUS: Full Code  Allergies  Allergen Reactions   Nsaids Other (See Comments)    Chronic kidney disease - avoid NSAIDs    Chief Complaint  Patient presents with   Acute Visit    Care plan meeting    HPI:  We have come together for his care plan meeting.  family present. The purpose of this meeting is to discuss his discharge. He is managing his own ostomy. His IVF have been discontinued. Will recheck BMP on 08-04-21. He is walking independently. He has completed therapy. Will set out 2 liters daily at home for drinking. He has restarted back on xanax twice daily. He is wanting to go home next week.   Past Medical History:  Diagnosis Date   Back pain    Bipolar disorder ([place])    bipolar 1   CKD (chronic kidney disease) stage 2, GFR 60-89 ml/min 09/24/2020   Hypertension    Knee pain    Migraine    Rectal cancer ([place])    Type 2 diabetes mellitus with complication, without long-term current use of insulin ([employer]) 03/19/2021    Past Surgical History:  Procedure Laterality Date   BACK SURGERY     JOINT REPLACEMENT Right    knee   OSTOMY N/A 05/27/2021   Procedure: POSSIBLE OSTOMY;  Surgeon: Michael Boston, MD;  Location: [facility];  Service: General;  Laterality: N/A;   PORTACATH PLACEMENT N/A 10/02/2020   Procedure: PORT-A-CATH PLACEMENT;  Surgeon: Dwan Bolt, MD;  Location: [facility];  Service: General;  Laterality: N/A;   PROCTOSCOPY N/A 05/27/2021   Procedure: RIGID PROCTOSCOPY;  Surgeon: Michael Boston, MD;  [facility]: [facility];  Service: General;  Laterality: N/A;   XI ROBOTIC ASSISTED LOWER ANTERIOR RESECTION N/A 05/27/2021   Procedure: ROBOTIC LOW ANTERIOR COLON RESECTION, VERY LOW COLOANAL ANASTOMOSIS, DIVERTING ILEOSTOMY, BILATERAL TAP BLOCK,  FIRFLY INJECTION;  Surgeon: Michael Boston, MD;  [facility];  Service: General;  Laterality: N/A;    Social History    Socioeconomic History   Marital status: Divorced    Spouse name: Not on file   Number of children: Not on file   Years of education: Not on file   Highest education level: Not on file  Occupational History   Not on file  Tobacco Use   Smoking status: Former    Packs/day: 0.50    Years: 45.00    Pack years: 22.50    Types: Cigarettes    Quit date: 04/22/2021    Years since quitting: 0.2   Smokeless tobacco: Never  Vaping Use   Vaping Use: Never used  Substance and Sexual Activity   Alcohol use: No   Drug use: No   Sexual activity: Not on file  Other Topics Concern   Not on file  Social History Narrative   Not on file   Social Determinants of Health   Financial Resource Strain: Not on file  Food Insecurity: Not on file  Transportation Needs: Not on file  Physical Activity: Not on file  Stress: Not on file  Social Connections: Not on file  Intimate Partner Violence: Not on file   Family History  Problem Relation Age of Onset   ALS Mother    Heart disease Mother    Parkinson's disease Mother    Heart failure Father       VITAL SIGNS BP 129/86  Pulse (!) 103   Temp (!) 97.4 F (36.3 C)   Resp 20   Ht 6' (1.829 m)   Wt 214 lb 6.4 oz (97.3 kg)   SpO2 95%   BMI 29.08 kg/m   Outpatient Encounter Medications as of 07/31/2021  Medication Sig   acetaminophen (TYLENOL) 325 MG tablet Take 650 mg by mouth every 6 (six) hours as needed for mild pain.   albuterol (VENTOLIN HFA) 108 (90 Base) MCG/ACT inhaler Inhale 2 puffs into the lungs every 6 (six) hours as needed for wheezing or shortness of breath.   ALPRAZolam (XANAX) 1 MG tablet Take 1 mg by mouth at bedtime. Every morning as needed for anxiety, nerves, and behavior   Amino Acids-Protein Hydrolys (FEEDING SUPPLEMENT, PRO-STAT SUGAR FREE 64,) LIQD Take 30 mLs by mouth 3 (three) times daily with meals. Special Instructions: for albumin 2.8   aspirin EC 81 MG tablet Take 81 mg by mouth daily. Swallow whole.    Cholecalciferol (VITAMIN D3) 50 MCG (2000 UT) capsule Take 2,000 Units by mouth daily.   cholestyramine (QUESTRAN) 4 g packet Take 4 g by mouth 2 (two) times daily. to bulk stool for high output ileostomy   dronabinol (MARINOL) 2.5 MG capsule Take 1 capsule (2.5 mg total) by mouth daily before lunch.   Heparin Sodium, Porcine, (HEPARIN LOCK FLUSH IJ) syringe; 10 unit/mL; amt: 10 units/mL; intravenous; Twice A Day Special Instructions: Flush port line after each use.   lidocaine-prilocaine (EMLA) cream Apply 1 application topically as directed. Apply 1/2 tablespoon to port-a-cath site 2 hours prior to stick and cover with plastic wrap to numb site   loperamide (IMODIUM) 2 MG capsule Take 2 mg by mouth every 8 (eight) hours as needed for diarrhea or loose stools.   loperamide (IMODIUM) 2 MG capsule Take 1 capsule (2 mg total) by mouth 2 (two) times daily.   magnesium oxide (MAG-OX) 400 (240 Mg) MG tablet TAKE ONE TABLET TWICE DAILY   melatonin 3 MG TABS tablet Take 3 mg by mouth at bedtime.   mirtazapine (REMERON) 45 MG tablet Take 45 mg by mouth at bedtime.   Needle, Disp, (HUBER NEEDLE 22GX3/4") 22G X 3/4" MISC 22 x 3/4 "; miscellaneous,Once A Day on Mon Special Instructions: change weekly   NON FORMULARY Diet:Regular Diet with Thin Liquids   Nutritional Supplements (ENSURE ENLIVE PO) Take by mouth. 120 ml with medpass to protect muscle mass and help meet increased proten/energy needs Twice A Day Between Meals   omeprazole (PRILOSEC) 20 MG capsule Take 1 capsule (20 mg total) 2 (two) times daily by mouth.   ondansetron (ZOFRAN) 8 MG tablet Take 1 tablet (8 mg total) by mouth every 8 (eight) hours as needed for nausea or vomiting.   Ostomy Supplies (STOMAHESIVE PROTECTIVE) POWD (ostomy supplies) [OTC]  powder; - ; topical Every Shift - PRN Special Instructions: Apply to stoma periwound qshift prn irritation or excoriation.   polycarbophil (FIBERCON) 625 MG tablet Take 1 tablet (625 mg total) by  mouth 2 (two) times daily.   potassium chloride SA (KLOR-CON M) 20 MEQ tablet Take 20 mEq by mouth 2 (two) times daily.   pravastatin (PRAVACHOL) 40 MG tablet Take 40 mg by mouth daily.   propranolol (INNOPRAN XL) 80 MG 24 hr capsule Take 160 mg by mouth daily.   risperiDONE (RISPERDAL) 0.25 MG tablet Take 0.125 mg by mouth at bedtime.   sodium chloride 0.9 % injection 150 cc per/hr for 8 hours Once A Day; intravenous  Special Instructions: Give N/S @ 150cc/hr. x 8 hours each night. [DX: Ileostomy status] 73m Twice A Day; injection Special Instructions: Flush port-a-cath with N/S 138mBID & then follow with the heparin flush. (See order for heparin flush)   SUMAtriptan (IMITREX) 100 MG tablet Take 100 mg by mouth 2 (two) times daily as needed for migraine.   tamsulosin (FLOMAX) 0.4 MG CAPS capsule Take 0.4 mg by mouth at bedtime.   ondansetron (ZOFRAN-ODT) 8 MG disintegrating tablet Take 8 mg by mouth every 8 (eight) hours as needed for nausea or vomiting.   No facility-administered encounter medications on file as of 07/31/2021.     SIGNIFICANT DIAGNOSTIC EXAMS  PREVIOUS   04-01-21: 2-d echo:  1. There is severe asymmetric hypertrophy of the apical segments (up to  18 mm) consistent with apical variant hypertrophic cardiomyopathy. There  is concern for an apical aneurysm on this study, however this was a  difficult study. Would recommend cardiac  MRI for better characterization. Left ventricular ejection fraction, by  estimation, is 55 to 60%. The left ventricle has normal function. The left ventricle has no regional wall motion abnormalities. There is severe  asymmetric left ventricular  hypertrophy of the apical segment. Left ventricular diastolic parameters  are consistent with Grade I diastolic dysfunction (impaired relaxation).   05-29-21: renal ultrasound Redemonstration of right renal cyst without evidence of superimposed hydronephrosis. Greatest dimension estimated 8.2 cm. Left renal  cortex not well evaluated and is atrophic/replaced by chronic cystic change/hydronephrosis in the setting of chronic UPJ obstruction described on prior CT.  NO NEW EXAMS.   LABS REVIEWED;   05-14-21: hgb a1c 7.2 05-28-21: wbc 9.7; hgb 13.5; hct 38.8; mcv 97.5 plt 124; glucose 193; bun 28; creat 1.74; k+ 4.4; na++ 134; ca 7.8; GFR 43; protein 5.8; albumin 3.1; mag 1.5 06-01-21: glucose 134; bun 28; creat 1.69; k+ 3.7; na++ 136; ca 7.7; GFR 44 06-09-21: wbc 7.4; hgb 8.5; hct 26.8; mcv 100.8 plt 399; glucose 145; bun 19; creat 1.43; k+ 3.8; na++ 135; ca 8.3; GFR 54; crp 10.7; d-dimer 7.64  06-15-21: mag 2.0 06-18-21: CRP 1.0; d-dimer 2.81  06-21-21: glucose 159; bun 72; creat 5.93; k+ 5.9; na++ 130; ca 8.9; GFR 10 06-22-21: wbc 6.6; hgb 10.2; hct 31.8; mcv 97.8 plt 175; glucose 165; bun 70; creat 5.39; k+ 5.8; na++ 132; ca 8.7; GFR 11 d-dimer 1.77  06-23-21: glucose 191; bun 54; creat 3.12; k+ 5.2; na++ 136; ca 8.9; GFR 21; protein 6.2 albumin 2.8 06-24-21: glucose 120; bun 47; creat 2.40; k+ 6.0 ;na++ 140; ca 8.8; GFR 29 06-25-21: glucose 119; bun 45; creat 2.91; k+ 5.2; na++ 138; ca 8.4; GFR 23; lipase 31; d-dimer 2.02   06-26-21: glucose 116; bun 38; creat 2.49; k+ 4.2; na++ 142; ca 8.4; GFR 28; protein 5.7; albumin 2.6 06-29-21: wbc 4.9; hgb 7.9; hct 24.9; mcv 98.0 plt 115; glucose 150; bun 19; creat 1.57; k+ 3.8 na++ 140; ca 7.8; GFR 48  06-30-21: hgb 8.6; hct 26.5; glucose 102; bun 13; creat 1.45; k+ 3.6; na++ 140; ca 7.8; GFR 53  07-01-21: wbc 4.4; hgb 8.2; hct 25.8; mcv 97.7 plt 93; glucose 115; bun 9; creat 1.32; k+ 3.8; na++ 140; ca 7.5; GFR 59; d-dimer 3.48 hepatic panel normal  07-06-21: wbc 4.7; hgb 8.9; hct 28.1 mcv 97.6 plt 188; glucose 114; bun 8; creat 1.33; k+ 3.4; na++ 138; ca 8.2; GFR 59 protein 6.0 albumin 2.5   07-10-21: urine culture: enterobacter cloacae: macrobid 07-16-21: d-dimer  1.57   TODAY  07-30-21: glucose 117; bun 12; creat 1.36; k+ 3.8; na++ 136; ca 8.2; gfr 57  Review of Systems   Constitutional:  Negative for malaise/fatigue.  Respiratory:  Negative for cough and shortness of breath.   Cardiovascular:  Negative for chest pain, palpitations and leg swelling.  Gastrointestinal:  Negative for abdominal pain, constipation and heartburn.  Musculoskeletal:  Negative for back pain, joint pain and myalgias.  Skin: Negative.   Neurological:  Negative for dizziness.  Psychiatric/Behavioral:  The patient is not nervous/anxious.     Physical Exam Constitutional:      General: He is not in acute distress.    Appearance: He is well-developed and overweight. He is not diaphoretic.  Neck:     Thyroid: No thyromegaly.  Cardiovascular:     Rate and Rhythm: Normal rate and regular rhythm.     Heart sounds: Normal heart sounds.  Pulmonary:     Effort: Pulmonary effort is normal. No respiratory distress.     Breath sounds: Normal breath sounds.  Abdominal:     General: Bowel sounds are normal. There is no distension.     Palpations: Abdomen is soft.     Tenderness: There is no abdominal tenderness.     Comments: Ileostomy   Genitourinary:    Comments: foley Musculoskeletal:        General: Normal range of motion.     Cervical back: Neck supple.     Right lower leg: No edema.     Left lower leg: No edema.  Lymphadenopathy:     Cervical: No cervical adenopathy.  Skin:    General: Skin is warm and dry.  Neurological:     Mental Status: He is alert and oriented to person, place, and time.  Psychiatric:        Mood and Affect: Mood normal.     ASSESSMENT/ PLAN:  TODAY  The goal of his care is: is to go home next week as possible; depending on his renal function.    Time spent with patient: 40 minutes: goals of care; self care and future care.   Ok Edwards NP [facility] Adult Medicine   call [phone]

## 2021-08-04 ENCOUNTER — Other Ambulatory Visit: Payer: Self-pay | Admitting: Adult Health

## 2021-08-04 ENCOUNTER — Other Ambulatory Visit (HOSPITAL_COMMUNITY)
Admission: RE | Admit: 2021-08-04 | Discharge: 2021-08-04 | Disposition: A | Discharge status: Home / Self Care | Payer: Medicare Other | Source: Skilled Nursing Facility | Attending: Adult Health | Admitting: Adult Health

## 2021-08-04 DIAGNOSIS — I129 Hypertensive chronic kidney disease with stage 1 through stage 4 chronic kidney disease, or unspecified chronic kidney disease: Secondary | ICD-10-CM | POA: Insufficient documentation

## 2021-08-04 LAB — BASIC METABOLIC PANEL
Anion gap: 8 (ref 5–15)
BUN: 12 mg/dL (ref 8–23)
CO2: 28 mmol/L (ref 22–32)
Calcium: 8.8 mg/dL — ABNORMAL LOW (ref 8.9–10.3)
Chloride: 103 mmol/L (ref 98–111)
Creatinine, Ser: 1.33 mg/dL — ABNORMAL HIGH (ref 0.61–1.24)
GFR, Estimated: 59 mL/min — ABNORMAL LOW (ref >60)
Glucose, Bld: 104 mg/dL — ABNORMAL HIGH (ref 70–99)
Potassium: 3.8 mmol/L (ref 3.5–5.1)
Sodium: 139 mmol/L (ref 135–145)

## 2021-08-04 MED ORDER — ALPRAZOLAM 1 MG PO TABS: 1.0000 mg | ORAL_TABLET | Freq: Two times a day (BID) | ORAL | 0 refills | Status: DC

## 2021-08-04 MED ORDER — ALPRAZOLAM 1 MG PO TABS: 1.0000 mg | ORAL_TABLET | Freq: Every day | ORAL | 0 refills | Status: DC

## 2021-08-06 ENCOUNTER — Other Ambulatory Visit: Payer: Self-pay | Admitting: Adult Health

## 2021-08-06 ENCOUNTER — Non-Acute Institutional Stay (SKILLED_NURSING_FACILITY): Payer: Medicare Other | Admitting: Adult Health

## 2021-08-06 ENCOUNTER — Encounter: Payer: Self-pay | Admitting: Adult Health

## 2021-08-06 DIAGNOSIS — Z932 Ileostomy status: Secondary | ICD-10-CM | POA: Diagnosis not present

## 2021-08-06 DIAGNOSIS — E1122 Type 2 diabetes mellitus with diabetic chronic kidney disease: Secondary | ICD-10-CM

## 2021-08-06 DIAGNOSIS — I700 Atherosclerosis of aorta: Secondary | ICD-10-CM | POA: Diagnosis not present

## 2021-08-06 DIAGNOSIS — R198 Other specified symptoms and signs involving the digestive system and abdomen: Secondary | ICD-10-CM | POA: Diagnosis not present

## 2021-08-06 DIAGNOSIS — C20 Malignant neoplasm of rectum: Secondary | ICD-10-CM

## 2021-08-06 DIAGNOSIS — I129 Hypertensive chronic kidney disease with stage 1 through stage 4 chronic kidney disease, or unspecified chronic kidney disease: Secondary | ICD-10-CM

## 2021-08-06 DIAGNOSIS — N1831 Chronic kidney disease, stage 3a: Secondary | ICD-10-CM

## 2021-08-06 MED ORDER — PRO-STAT SUGAR FREE PO LIQD: 30.0000 mL | Freq: Three times a day (TID) | ORAL | 0 refills | Status: DC

## 2021-08-06 MED ORDER — POTASSIUM CHLORIDE CRYS ER 20 MEQ PO TBCR: 20.0000 meq | EXTENDED_RELEASE_TABLET | Freq: Two times a day (BID) | ORAL | 0 refills | Status: DC

## 2021-08-06 MED ORDER — ALBUTEROL SULFATE HFA 108 (90 BASE) MCG/ACT IN AERS: 2.0000 | INHALATION_SPRAY | Freq: Four times a day (QID) | RESPIRATORY_TRACT | 0 refills | Status: DC | PRN

## 2021-08-06 MED ORDER — ONDANSETRON 8 MG PO TBDP
8.0000 mg | ORAL_TABLET | Freq: Three times a day (TID) | ORAL | 0 refills | Status: DC | PRN
Start: 2021-08-06 — End: 2022-08-11

## 2021-08-06 MED ORDER — CHOLESTYRAMINE 4 G PO PACK: 4.0000 g | PACK | Freq: Two times a day (BID) | ORAL | 0 refills | Status: DC

## 2021-08-06 MED ORDER — MAGNESIUM OXIDE -MG SUPPLEMENT 400 (240 MG) MG PO TABS
1.0000 | ORAL_TABLET | Freq: Two times a day (BID) | ORAL | 0 refills | Status: DC
Start: 2021-08-06 — End: 2022-08-11

## 2021-08-06 MED ORDER — PROPRANOLOL HCL ER BEADS 80 MG PO CP24: 160.0000 mg | ORAL_CAPSULE | Freq: Every day | ORAL | 0 refills | Status: DC

## 2021-08-06 MED ORDER — RISPERIDONE 0.25 MG PO TABS
0.1250 mg | ORAL_TABLET | Freq: Every day | ORAL | 0 refills | Status: DC
Start: 2021-08-06 — End: 2022-10-21

## 2021-08-06 MED ORDER — ALPRAZOLAM 1 MG PO TABS: 1.0000 mg | ORAL_TABLET | Freq: Two times a day (BID) | ORAL | 0 refills | Status: AC

## 2021-08-06 MED ORDER — MIRTAZAPINE 45 MG PO TABS: 45.0000 mg | ORAL_TABLET | Freq: Every day | ORAL | 0 refills | Status: AC

## 2021-08-06 MED ORDER — SUMATRIPTAN SUCCINATE 100 MG PO TABS: 100.0000 mg | ORAL_TABLET | Freq: Two times a day (BID) | ORAL | 0 refills | Status: AC | PRN

## 2021-08-06 MED ORDER — TAMSULOSIN HCL 0.4 MG PO CAPS: 0.4000 mg | ORAL_CAPSULE | Freq: Every day | ORAL | 0 refills | Status: AC

## 2021-08-06 MED ORDER — PRAVASTATIN SODIUM 40 MG PO TABS: 40.0000 mg | ORAL_TABLET | Freq: Every day | ORAL | 0 refills | Status: AC

## 2021-08-06 NOTE — Progress Notes (Signed)
Location:  [facility] Room Number: 937-T Place of Service:  SNF (31)  Provider: Ok Edwards np   PCP: Aura Dials, MD Patient Care Team: Aura Dials, MD as PCP - General (Family Medicine) Michael Boston, MD as Consulting Physician (General Surgery) Otis Brace, MD as Consulting Physician (Gastroenterology) Ladell Pier, MD as Consulting Physician (Oncology) Kyung Rudd, MD as Consulting Physician (Radiation Oncology) Royston Bake, RN as Registered Nurse (Oncology) Larey Dresser, MD as Consulting Physician (Cardiology) Lucas Mallow, MD as Consulting Physician (Urology)  Extended Emergency Contact Information Primary Emergency Contact: Decker,Tim Address: [address]          [place], [place] [address] [place] L[place] of [place] Phone: [phone] Mobile Phone: [phone] Relation: Brother Secondary Emergency Contact: Lendon Ka Address: [address]          [place], [place] [address] [place] Phone: [phone] Mobile Phone: [phone] Relation: Relative Preferred language: English Interpreter needed? No  Code Status: full  Goals of care:  Advanced Directive information    08/06/2021   12:13 PM  Advanced Directives  Does Patient Have a Medical Advance Directive? No  Would patient like information on creating a medical advance directive? No - Patient declined     Allergies  Allergen Reactions   Nsaids Other (See Comments)    Chronic kidney disease - avoid NSAIDs    Chief Complaint  Patient presents with   Discharge Note    HPI:  66 y.o. male  being discharged to home with home health for pt/rn. He will not need any dme. He will need his prescriptions written and will need to follow up with his medical provider. He had a high output ileostomy. He was admitted to fluid management and therapy. He has participated in therapy. His ileostomy is not longer high output; but is  liquid stool.he is participating with his ostomy care.  He is now ready to return back home.     Past Medical History:  Diagnosis Date   Back pain    Bipolar disorder ([place])    bipolar 1   CKD (chronic kidney disease) stage 2, GFR 60-89 ml/min 09/24/2020   Hypertension    Knee pain    Migraine    Rectal cancer ([place])    Type 2 diabetes mellitus with complication, without long-term current use of insulin ([place]) 03/19/2021    Past Surgical History:  Procedure Laterality Date   BACK SURGERY     JOINT REPLACEMENT Right    knee   OSTOMY N/A 05/27/2021   Procedure: POSSIBLE OSTOMY;  Surgeon: Michael Boston, MD;  Location: [facility];  Service: General;  Laterality: N/A;   PORTACATH PLACEMENT N/A 10/02/2020   Procedure: PORT-A-CATH PLACEMENT;  Surgeon: Dwan Bolt, MD;  Location: [facility];  Service: General;  Laterality: N/A;   PROCTOSCOPY N/A 05/27/2021   Procedure: RIGID PROCTOSCOPY;  Surgeon: Michael Boston, MD;  Location: [facility];  Service: General;  Laterality: N/A;   XI ROBOTIC ASSISTED LOWER ANTERIOR RESECTION N/A 05/27/2021   Procedure: ROBOTIC LOW ANTERIOR COLON RESECTION, VERY LOW COLOANAL ANASTOMOSIS, DIVERTING ILEOSTOMY, BILATERAL TAP BLOCK,  FIRFLY INJECTION;  Surgeon: Michael Boston, MD;  Location: [facility];  Service: General;  Laterality: N/A;      reports that he quit smoking about 3 months ago. His smoking use included cigarettes. He has a 22.50 pack-year smoking history. He has never used smokeless tobacco. He reports that he does not drink alcohol and  does not use drugs. Social History   Socioeconomic History   Marital status: Divorced    Spouse name: Not on file   Number of children: Not on file   Years of education: Not on file   Highest education level: Not on file  Occupational History   Not on file  Tobacco Use   Smoking status: Former    Packs/day: 0.50    Years: 45.00    Pack years: 22.50    Types: Cigarettes    Quit date: 04/22/2021    Years since quitting: 0.3    Smokeless tobacco: Never  Vaping Use   Vaping Use: Never used  Substance and Sexual Activity   Alcohol use: No   Drug use: No   Sexual activity: Not on file  Other Topics Concern   Not on file  Social History Narrative   Not on file   Social Determinants of Health   Financial Resource Strain: Not on file  Food Insecurity: Not on file  Transportation Needs: Not on file  Physical Activity: Not on file  Stress: Not on file  Social Connections: Not on file  Intimate Partner Violence: Not on file   Functional Status Survey:    Allergies  Allergen Reactions   Nsaids Other (See Comments)    Chronic kidney disease - avoid NSAIDs    Pertinent  Health Maintenance Due  Topic Date Due   FOOT EXAM  Never done   OPHTHALMOLOGY EXAM  Never done   URINE MICROALBUMIN  Never done   INFLUENZA VACCINE  10/06/2021   HEMOGLOBIN A1C  11/14/2021   COLONOSCOPY (Pts 45-59yr Insurance coverage will need to be confirmed)  09/06/2030    Medications: Outpatient Encounter Medications as of 08/06/2021  Medication Sig   acetaminophen (TYLENOL) 325 MG tablet Take 650 mg by mouth every 6 (six) hours as needed for mild pain.   albuterol (VENTOLIN HFA) 108 (90 Base) MCG/ACT inhaler Inhale 2 puffs into the lungs every 6 (six) hours as needed for wheezing or shortness of breath.   ALPRAZolam (XANAX) 1 MG tablet Take 1 tablet (1 mg total) by mouth 2 (two) times daily. Every morning as needed for anxiety, nerves, and behavior   Amino Acids-Protein Hydrolys (FEEDING SUPPLEMENT, PRO-STAT SUGAR FREE 64,) LIQD Take 30 mLs by mouth 3 (three) times daily with meals. Special Instructions: for albumin 2.8   aspirin EC 81 MG tablet Take 81 mg by mouth daily. Swallow whole.   Cholecalciferol (VITAMIN D3) 50 MCG (2000 UT) capsule Take 2,000 Units by mouth daily.   cholestyramine (QUESTRAN) 4 g packet Take 1 packet (4 g total) by mouth 2 (two) times daily. to bulk stool for high output ileostomy   [EXPIRED] dronabinol  (MARINOL) 2.5 MG capsule Take 1 capsule (2.5 mg total) by mouth daily before lunch.   Heparin Sodium, Porcine, (HEPARIN LOCK FLUSH IJ) syringe; 10 unit/mL; amt: 10 units/mL; intravenous; Twice A Day Special Instructions: Flush port line after each use.   lidocaine-prilocaine (EMLA) cream Apply 1 application topically as directed. Apply 1/2 tablespoon to port-a-cath site 2 hours prior to stick and cover with plastic wrap to numb site   loperamide (IMODIUM) 2 MG capsule Take 2 mg by mouth every 8 (eight) hours as needed for diarrhea or loose stools.   loperamide (IMODIUM) 2 MG capsule Take 1 capsule (2 mg total) by mouth 2 (two) times daily.   magnesium oxide (MAG-OX) 400 (240 Mg) MG tablet Take 1 tablet (400 mg total) by mouth  2 (two) times daily.   melatonin 3 MG TABS tablet Take 3 mg by mouth at bedtime.   mirtazapine (REMERON) 45 MG tablet Take 1 tablet (45 mg total) by mouth at bedtime.   Needle, Disp, (HUBER NEEDLE 22GX3/4") 22G X 3/4" MISC 22 x 3/4 "; miscellaneous,Once A Day on Mon Special Instructions: change weekly   NON FORMULARY Diet:Regular Diet with Thin Liquids   Nutritional Supplements (ENSURE ENLIVE PO) Take by mouth. 120 ml with medpass to protect muscle mass and help meet increased proten/energy needs Twice A Day Between Meals   omeprazole (PRILOSEC) 20 MG capsule Take 1 capsule (20 mg total) 2 (two) times daily by mouth.   ondansetron (ZOFRAN-ODT) 8 MG disintegrating tablet Take 1 tablet (8 mg total) by mouth every 8 (eight) hours as needed for nausea or vomiting.   Ostomy Supplies (STOMAHESIVE PROTECTIVE) POWD (ostomy supplies) [OTC]  powder; - ; topical Every Shift - PRN Special Instructions: Apply to stoma periwound qshift prn irritation or excoriation.   polycarbophil (FIBERCON) 625 MG tablet Take 1 tablet (625 mg total) by mouth 2 (two) times daily.   potassium chloride SA (KLOR-CON M) 20 MEQ tablet Take 1 tablet (20 mEq total) by mouth 2 (two) times daily.   pravastatin  (PRAVACHOL) 40 MG tablet Take 1 tablet (40 mg total) by mouth daily.   propranolol (INNOPRAN XL) 80 MG 24 hr capsule Take 2 capsules (160 mg total) by mouth daily.   risperiDONE (RISPERDAL) 0.25 MG tablet Take 0.5 tablets (0.125 mg total) by mouth at bedtime.   sodium chloride 0.9 % SOLN Flush port-a-cath with N/S 36m BID & then follow with the heparin flush. (See order for heparin flush) Twice A Day   SUMAtriptan (IMITREX) 100 MG tablet Take 1 tablet (100 mg total) by mouth 2 (two) times daily as needed for migraine.   tamsulosin (FLOMAX) 0.4 MG CAPS capsule Take 1 capsule (0.4 mg total) by mouth at bedtime.   [DISCONTINUED] ondansetron (ZOFRAN) 8 MG tablet Take 1 tablet (8 mg total) by mouth every 8 (eight) hours as needed for nausea or vomiting.   No facility-administered encounter medications on file as of 08/06/2021.    Vitals:   08/06/21 1202  BP: 129/86  Pulse: (!) 103  Resp: 20  Temp: (!) 97.1 F (36.2 C)  SpO2: 95%  Weight: 214 lb 6.4 oz (97.3 kg)  Height: 6' (1.829 m)   Body mass index is 29.08 kg/m.   SIGNIFICANT DIAGNOSTIC EXAMS  PREVIOUS   04-01-21: 2-d echo:  1. There is severe asymmetric hypertrophy of the apical segments (up to  18 mm) consistent with apical variant hypertrophic cardiomyopathy. There  is concern for an apical aneurysm on this study, however this was a  difficult study. Would recommend cardiac  MRI for better characterization. Left ventricular ejection fraction, by  estimation, is 55 to 60%. The left ventricle has normal function. The left ventricle has no regional wall motion abnormalities. There is severe  asymmetric left ventricular  hypertrophy of the apical segment. Left ventricular diastolic parameters  are consistent with Grade I diastolic dysfunction (impaired relaxation).   05-29-21: renal ultrasound Redemonstration of right renal cyst without evidence of superimposed hydronephrosis. Greatest dimension estimated 8.2 cm. Left renal cortex not  well evaluated and is atrophic/replaced by chronic cystic change/hydronephrosis in the setting of chronic UPJ obstruction described on prior CT.  NO NEW EXAMS.   LABS REVIEWED;   05-14-21: hgb a1c 7.2 05-28-21: wbc 9.7; hgb 13.5; hct 38.8;  mcv 97.5 plt 124; glucose 193; bun 28; creat 1.74; k+ 4.4; na++ 134; ca 7.8; GFR 43; protein 5.8; albumin 3.1; mag 1.5 06-01-21: glucose 134; bun 28; creat 1.69; k+ 3.7; na++ 136; ca 7.7; GFR 44 06-09-21: wbc 7.4; hgb 8.5; hct 26.8; mcv 100.8 plt 399; glucose 145; bun 19; creat 1.43; k+ 3.8; na++ 135; ca 8.3; GFR 54; crp 10.7; d-dimer 7.64  06-15-21: mag 2.0 06-18-21: CRP 1.0; d-dimer 2.81  06-21-21: glucose 159; bun 72; creat 5.93; k+ 5.9; na++ 130; ca 8.9; GFR 10 06-22-21: wbc 6.6; hgb 10.2; hct 31.8; mcv 97.8 plt 175; glucose 165; bun 70; creat 5.39; k+ 5.8; na++ 132; ca 8.7; GFR 11 d-dimer 1.77  06-23-21: glucose 191; bun 54; creat 3.12; k+ 5.2; na++ 136; ca 8.9; GFR 21; protein 6.2 albumin 2.8 06-24-21: glucose 120; bun 47; creat 2.40; k+ 6.0 ;na++ 140; ca 8.8; GFR 29 06-25-21: glucose 119; bun 45; creat 2.91; k+ 5.2; na++ 138; ca 8.4; GFR 23; lipase 31; d-dimer 2.02   06-26-21: glucose 116; bun 38; creat 2.49; k+ 4.2; na++ 142; ca 8.4; GFR 28; protein 5.7; albumin 2.6 06-29-21: wbc 4.9; hgb 7.9; hct 24.9; mcv 98.0 plt 115; glucose 150; bun 19; creat 1.57; k+ 3.8 na++ 140; ca 7.8; GFR 48  06-30-21: hgb 8.6; hct 26.5; glucose 102; bun 13; creat 1.45; k+ 3.6; na++ 140; ca 7.8; GFR 53  07-01-21: wbc 4.4; hgb 8.2; hct 25.8; mcv 97.7 plt 93; glucose 115; bun 9; creat 1.32; k+ 3.8; na++ 140; ca 7.5; GFR 59; d-dimer 3.48 hepatic panel normal  07-06-21: wbc 4.7; hgb 8.9; hct 28.1 mcv 97.6 plt 188; glucose 114; bun 8; creat 1.33; k+ 3.4; na++ 138; ca 8.2; GFR 59 protein 6.0 albumin 2.5   07-10-21: urine culture: enterobacter cloacae: macrobid 07-16-21: d-dimer 1.57  07-30-21: glucose 117; bun 12; creat 1.36; k+ 3.8; na++ 136; ca 8.2; gfr 57  NO NEW LABS.   Review of Systems   Constitutional:  Negative for malaise/fatigue.  Respiratory:  Negative for cough and shortness of breath.   Cardiovascular:  Negative for chest pain, palpitations and leg swelling.  Gastrointestinal:  Negative for abdominal pain, constipation and heartburn.  Musculoskeletal:  Negative for back pain, joint pain and myalgias.  Skin: Negative.   Neurological:  Negative for dizziness.  Psychiatric/Behavioral:  The patient is not nervous/anxious.     Physical Exam Constitutional:      General: He is not in acute distress.    Appearance: He is well-developed. He is not diaphoretic.  Neck:     Thyroid: No thyromegaly.  Cardiovascular:     Rate and Rhythm: Normal rate and regular rhythm.     Pulses: Normal pulses.     Heart sounds: Normal heart sounds.  Pulmonary:     Effort: Pulmonary effort is normal. No respiratory distress.     Breath sounds: Normal breath sounds.  Abdominal:     General: Bowel sounds are normal. There is no distension.     Palpations: Abdomen is soft.     Tenderness: There is no abdominal tenderness.     Comments: Ileostomy   Genitourinary:    Comments: foley Musculoskeletal:        General: Normal range of motion.     Cervical back: Neck supple.  Lymphadenopathy:     Cervical: No cervical adenopathy.  Skin:    General: Skin is warm and dry.  Neurological:     Mental Status: He is alert and oriented to  person, place, and time.  Psychiatric:        Mood and Affect: Mood normal.     Assessment/Plan:    Patient is being discharged with the following home health services:  rn/pt: to evaluate and treat as indicated for gait balance strength medication management  Patient is being discharged with the following durable medical equipment:  none needed   Patient has been advised to f/u with their PCP in 1-2 weeks to for a transitions of care visit.  Social services at their facility was responsible for arranging this appointment.  Pt was provided with adequate  prescriptions of noncontrolled medications to reach the scheduled appointment .  For controlled substances, a limited supply was provided as appropriate for the individual patient.  If the pt normally receives these medications from a pain clinic or has a contract with another physician, these medications should be received from that clinic or physician only).    A 30 day supply of his prescription medications have been sent to [facility]  Time with patient: 35 minutes: medications; home health dme.   Ok Edwards NP [facility]  call [phone]

## 2021-08-07 DIAGNOSIS — N179 Acute kidney failure, unspecified: Secondary | ICD-10-CM | POA: Diagnosis not present

## 2021-08-07 DIAGNOSIS — C20 Malignant neoplasm of rectum: Secondary | ICD-10-CM | POA: Diagnosis not present

## 2021-08-07 DIAGNOSIS — E1169 Type 2 diabetes mellitus with other specified complication: Secondary | ICD-10-CM | POA: Diagnosis not present

## 2021-08-07 DIAGNOSIS — E1122 Type 2 diabetes mellitus with diabetic chronic kidney disease: Secondary | ICD-10-CM | POA: Diagnosis not present

## 2021-08-07 DIAGNOSIS — D6869 Other thrombophilia: Secondary | ICD-10-CM | POA: Diagnosis not present

## 2021-08-07 DIAGNOSIS — U071 COVID-19: Secondary | ICD-10-CM | POA: Diagnosis not present

## 2021-08-07 DIAGNOSIS — N1832 Chronic kidney disease, stage 3b: Secondary | ICD-10-CM | POA: Diagnosis not present

## 2021-08-07 DIAGNOSIS — Z432 Encounter for attention to ileostomy: Secondary | ICD-10-CM | POA: Diagnosis not present

## 2021-08-07 DIAGNOSIS — I129 Hypertensive chronic kidney disease with stage 1 through stage 4 chronic kidney disease, or unspecified chronic kidney disease: Secondary | ICD-10-CM | POA: Diagnosis not present

## 2021-08-07 DIAGNOSIS — E43 Unspecified severe protein-calorie malnutrition: Secondary | ICD-10-CM | POA: Diagnosis not present

## 2021-08-07 DIAGNOSIS — N390 Urinary tract infection, site not specified: Secondary | ICD-10-CM | POA: Diagnosis not present

## 2021-08-10 ENCOUNTER — Ambulatory Visit: Payer: Self-pay | Admitting: Surgery

## 2021-08-10 DIAGNOSIS — N183 Chronic kidney disease, stage 3 unspecified: Secondary | ICD-10-CM | POA: Diagnosis not present

## 2021-08-10 DIAGNOSIS — Z932 Ileostomy status: Secondary | ICD-10-CM | POA: Diagnosis not present

## 2021-08-10 DIAGNOSIS — R198 Other specified symptoms and signs involving the digestive system and abdomen: Secondary | ICD-10-CM | POA: Diagnosis not present

## 2021-08-10 DIAGNOSIS — C20 Malignant neoplasm of rectum: Secondary | ICD-10-CM | POA: Diagnosis not present

## 2021-08-11 ENCOUNTER — Telehealth: Payer: Self-pay | Admitting: Cardiology

## 2021-08-11 NOTE — Telephone Encounter (Signed)
[name] from [facility] called wanting to get and order to continue seeing patient at his home.  She also wanted and order for physical therapy to evaluate the patient.William Good

## 2021-08-11 NOTE — Telephone Encounter (Signed)
Returned call to [place] at [facility] who states that patient needs home health orders for patient to receive ostomy teaching from a nurse at his home. Patient is being followed by an oncologist for rectal cancer. Advised her that these orders may need to come from them. She states that primary care told her that orders would need to come from Dr. Percival Spanish or Oncologist. Advised her to reach out patients oncologist for these orders, and advised her that I would also forward to Dr. Percival Spanish for him review. She verbalized understanding.

## 2021-08-12 ENCOUNTER — Telehealth: Payer: Self-pay

## 2021-08-12 DIAGNOSIS — I129 Hypertensive chronic kidney disease with stage 1 through stage 4 chronic kidney disease, or unspecified chronic kidney disease: Secondary | ICD-10-CM | POA: Diagnosis not present

## 2021-08-12 DIAGNOSIS — Z432 Encounter for attention to ileostomy: Secondary | ICD-10-CM | POA: Diagnosis not present

## 2021-08-12 DIAGNOSIS — N1832 Chronic kidney disease, stage 3b: Secondary | ICD-10-CM | POA: Diagnosis not present

## 2021-08-12 DIAGNOSIS — N390 Urinary tract infection, site not specified: Secondary | ICD-10-CM | POA: Diagnosis not present

## 2021-08-12 DIAGNOSIS — E1122 Type 2 diabetes mellitus with diabetic chronic kidney disease: Secondary | ICD-10-CM | POA: Diagnosis not present

## 2021-08-12 DIAGNOSIS — C20 Malignant neoplasm of rectum: Secondary | ICD-10-CM | POA: Diagnosis not present

## 2021-08-12 NOTE — Telephone Encounter (Signed)
TC from [facility] with [place] [phone] Requesting 6 more visits for ostomy teaching and general ostomy care as well as PT evaluation. Discussed with Dr Benay Spice who agreed to the extra visits.

## 2021-08-13 DIAGNOSIS — C20 Malignant neoplasm of rectum: Secondary | ICD-10-CM | POA: Diagnosis not present

## 2021-08-13 DIAGNOSIS — Z720 Tobacco use: Secondary | ICD-10-CM | POA: Diagnosis not present

## 2021-08-14 DIAGNOSIS — Z432 Encounter for attention to ileostomy: Secondary | ICD-10-CM | POA: Diagnosis not present

## 2021-08-14 DIAGNOSIS — Z932 Ileostomy status: Secondary | ICD-10-CM | POA: Diagnosis not present

## 2021-08-14 DIAGNOSIS — E1122 Type 2 diabetes mellitus with diabetic chronic kidney disease: Secondary | ICD-10-CM | POA: Diagnosis not present

## 2021-08-14 DIAGNOSIS — I129 Hypertensive chronic kidney disease with stage 1 through stage 4 chronic kidney disease, or unspecified chronic kidney disease: Secondary | ICD-10-CM | POA: Diagnosis not present

## 2021-08-14 DIAGNOSIS — C20 Malignant neoplasm of rectum: Secondary | ICD-10-CM | POA: Diagnosis not present

## 2021-08-14 DIAGNOSIS — N390 Urinary tract infection, site not specified: Secondary | ICD-10-CM | POA: Diagnosis not present

## 2021-08-14 DIAGNOSIS — N1832 Chronic kidney disease, stage 3b: Secondary | ICD-10-CM | POA: Diagnosis not present

## 2021-08-17 DIAGNOSIS — I129 Hypertensive chronic kidney disease with stage 1 through stage 4 chronic kidney disease, or unspecified chronic kidney disease: Secondary | ICD-10-CM | POA: Diagnosis not present

## 2021-08-17 DIAGNOSIS — N390 Urinary tract infection, site not specified: Secondary | ICD-10-CM | POA: Diagnosis not present

## 2021-08-17 DIAGNOSIS — C20 Malignant neoplasm of rectum: Secondary | ICD-10-CM | POA: Diagnosis not present

## 2021-08-17 DIAGNOSIS — E1122 Type 2 diabetes mellitus with diabetic chronic kidney disease: Secondary | ICD-10-CM | POA: Diagnosis not present

## 2021-08-17 DIAGNOSIS — Z432 Encounter for attention to ileostomy: Secondary | ICD-10-CM | POA: Diagnosis not present

## 2021-08-17 DIAGNOSIS — N1832 Chronic kidney disease, stage 3b: Secondary | ICD-10-CM | POA: Diagnosis not present

## 2021-08-18 ENCOUNTER — Other Ambulatory Visit: Payer: Self-pay

## 2021-08-18 ENCOUNTER — Inpatient Hospital Stay: Payer: Medicare Other

## 2021-08-18 ENCOUNTER — Inpatient Hospital Stay: Payer: Medicare Other | Attending: Oncology

## 2021-08-18 ENCOUNTER — Inpatient Hospital Stay: Payer: Medicare Other | Admitting: Oncology

## 2021-08-18 VITALS — BP 101/73 | HR 74 | Temp 98.1°F | Resp 18 | Ht 72.0 in | Wt 195.0 lb

## 2021-08-18 DIAGNOSIS — E785 Hyperlipidemia, unspecified: Secondary | ICD-10-CM | POA: Diagnosis not present

## 2021-08-18 DIAGNOSIS — K635 Polyp of colon: Secondary | ICD-10-CM | POA: Diagnosis not present

## 2021-08-18 DIAGNOSIS — Z95828 Presence of other vascular implants and grafts: Secondary | ICD-10-CM

## 2021-08-18 DIAGNOSIS — G620 Drug-induced polyneuropathy: Secondary | ICD-10-CM | POA: Insufficient documentation

## 2021-08-18 DIAGNOSIS — I129 Hypertensive chronic kidney disease with stage 1 through stage 4 chronic kidney disease, or unspecified chronic kidney disease: Secondary | ICD-10-CM | POA: Insufficient documentation

## 2021-08-18 DIAGNOSIS — C20 Malignant neoplasm of rectum: Secondary | ICD-10-CM | POA: Insufficient documentation

## 2021-08-18 DIAGNOSIS — N189 Chronic kidney disease, unspecified: Secondary | ICD-10-CM | POA: Insufficient documentation

## 2021-08-18 DIAGNOSIS — E1122 Type 2 diabetes mellitus with diabetic chronic kidney disease: Secondary | ICD-10-CM | POA: Insufficient documentation

## 2021-08-18 DIAGNOSIS — G2581 Restless legs syndrome: Secondary | ICD-10-CM | POA: Insufficient documentation

## 2021-08-18 DIAGNOSIS — F319 Bipolar disorder, unspecified: Secondary | ICD-10-CM | POA: Diagnosis not present

## 2021-08-18 LAB — MAGNESIUM: Magnesium: 1.6 mg/dL — ABNORMAL LOW (ref 1.7–2.4)

## 2021-08-18 LAB — CMP (CANCER CENTER ONLY)
ALT: 54 U/L — ABNORMAL HIGH (ref 0–44)
AST: 40 U/L (ref 15–41)
Albumin: 3.5 g/dL (ref 3.5–5.0)
Alkaline Phosphatase: 215 U/L — ABNORMAL HIGH (ref 38–126)
Anion gap: 8 (ref 5–15)
BUN: 10 mg/dL (ref 8–23)
CO2: 26 mmol/L (ref 22–32)
Calcium: 10 mg/dL (ref 8.9–10.3)
Chloride: 102 mmol/L (ref 98–111)
Creatinine: 1.5 mg/dL — ABNORMAL HIGH (ref 0.61–1.24)
GFR, Estimated: 51 mL/min — ABNORMAL LOW (ref >60)
Glucose, Bld: 168 mg/dL — ABNORMAL HIGH (ref 70–99)
Potassium: 4.5 mmol/L (ref 3.5–5.1)
Sodium: 136 mmol/L (ref 135–145)
Total Bilirubin: 0.4 mg/dL (ref 0.3–1.2)
Total Protein: 7.5 g/dL (ref 6.5–8.1)

## 2021-08-18 MED ORDER — HEPARIN SOD (PORK) LOCK FLUSH 100 UNIT/ML IV SOLN
500.0000 [IU] | Freq: Once | INTRAVENOUS | Status: AC
  Administered 2021-08-18: 500 [IU] via INTRAVENOUS

## 2021-08-18 MED ORDER — SODIUM CHLORIDE 0.9% FLUSH
10.0000 mL | Freq: Once | INTRAVENOUS | Status: AC
  Administered 2021-08-18: 10 mL via INTRAVENOUS

## 2021-08-18 NOTE — Progress Notes (Signed)
William Good OFFICE PROGRESS NOTE   Diagnosis: Rectal cancer  INTERVAL HISTORY:   William Good returns as scheduled.  He underwent a low anterior resection and diverting loop ileostomy on 05/27/2021.  He was discharged to a skilled nursing facility.  He has returned home.  He reports the stool output is liquid and solid.  He empties the ostomy bag approximately 5 times per day.  He is eating and drinking.  He is here today with her brother.  His brother reports William Good is not taking cholestyramine consistently.  His appetite is poor.  William Good has no specific complaint  Objective:  Vital signs in last 24 hours:  Blood pressure 101/73, pulse 74, temperature 98.1 F (36.7 C), temperature source Oral, resp. rate 18, height 6' (1.829 m), weight 195 lb (88.5 kg), SpO2 100 %.    HEENT: No thrush or ulcers Lymphatics: No cervical, supraclavicular, axillary, or inguinal nodes Resp: Lungs clear bilaterally Cardio: Regular rate and rhythm GI: No hepatosplenomegaly, right abdomen ileostomy with semiformed brown stool Vascular: No leg edema   Portacath/PICC-without erythema  Lab Results:  Lab Results  Component Value Date   WBC 4.7 07/06/2021   HGB 8.9 (L) 07/06/2021   HCT 28.1 (L) 07/06/2021   MCV 97.6 07/06/2021   PLT 188 07/06/2021   NEUTROABS 3.5 06/29/2021    CMP  Lab Results  Component Value Date   NA 136 08/18/2021   K 4.5 08/18/2021   CL 102 08/18/2021   CO2 26 08/18/2021   GLUCOSE 168 (H) 08/18/2021   BUN 10 08/18/2021   CREATININE 1.50 (H) 08/18/2021   CALCIUM 10.0 08/18/2021   PROT 7.5 08/18/2021   ALBUMIN 3.5 08/18/2021   AST 40 08/18/2021   ALT 54 (H) 08/18/2021   ALKPHOS 215 (H) 08/18/2021   BILITOT 0.4 08/18/2021   GFRNONAA 51 (L) 08/18/2021   GFRAA >60 01/17/2017    Lab Results  Component Value Date   CEA 3.54 04/14/2021     Medications: I have reviewed the patient's current medications.   Assessment/Plan: Rectal  cancer Mass at 6 cm on digital exam/anoscopy 08/25/2020 Colonoscopy 09/05/2020-mid rectal mass-biopsy, adenocarcinoma, no loss of mismatch repair protein expression CT abdomen/pelvis 08/29/2020-small amount of free fluid in the pelvis, mildly enlarged left external iliac node, cystic changes of the left kidney with hydronephrosis and regions of cortical thinning with transition at the left UPJ suggesting chronic left UPJ obstruction MRI pelvis 09/24/2020-tumor 6 cm from the anal verge, 1.8 cm from the internal anal sphincter, T2, 7 mm lymph node in the left mesorectal sheath, N1 Cycle 1 FOLFOX 10/08/2020 Cycle 2 FOLFOX 10/22/2020 Cycle 3 FOLFOX 11/12/2020 (oxaliplatin dose reduced, white cell growth factor support on day of pump discontinuation) Cycle 4 FOLFOX 11/26/2020, 5-FU bolus and infusion dose reduced secondary to diarrhea, Ziextenzo Cycle 5 FOLFOX 12/10/2020, 5-FU bolus held due to diarrhea, Ziextenzo Cycle 6 FOLFOX 12/24/2020, 5-FU bolus held, Ziextenzo Cycle 7 FOLFOX 01/07/2021, oxaliplatin held due to neuropathy, 5-FU bolus held, Ziextenzo held Cycle 8 FOLFOX 01/21/2021, oxaliplatin held due to neuropathy, 5-FU bolus held, Ziextenzo held Radiation/Xeloda 02/02/2021-03/16/2021 Low anterior resection/diverting loop ileostomy 05/27/2021, no residual carcinoma, 0/19 nodes, negative resection margins, [id number]     Diabetes Chronic kidney disease Bipolar disorder Hypertension Hyperlipidemia Restless leg syndrome Multiple colon polyps removed on the colonoscopy 09/05/2020-tubular adenomas including a rectal polyp with high-grade dysplasia-polypectomy margin negative for dysplasia Portacath placement, Dr Zenia Resides, 10/02/2020 Oxaliplatin neuropathy    Disposition: William Good completed total neoadjuvant therapy  for treatment of clinical stage III rectal cancer.  Had complete pathologic response.  There is no indication for adjuvant systemic therapy.  He will follow-up with Dr. Johney Maine for ileostomy  takedown and Port-A-Cath removal.  We will schedule a follow-up visit in 1 month.  I encouraged him to increase his intake of liquids and solids.  I recommended he take the Imodium, cholestyramine, and fiber as prescribed by Dr. Johney Maine.  He will contact us for symptoms of dehydration.  Betsy Coder, MD  08/18/2021  12:16 PM

## 2021-08-19 DIAGNOSIS — I129 Hypertensive chronic kidney disease with stage 1 through stage 4 chronic kidney disease, or unspecified chronic kidney disease: Secondary | ICD-10-CM | POA: Diagnosis not present

## 2021-08-19 DIAGNOSIS — Z432 Encounter for attention to ileostomy: Secondary | ICD-10-CM | POA: Diagnosis not present

## 2021-08-19 DIAGNOSIS — N390 Urinary tract infection, site not specified: Secondary | ICD-10-CM | POA: Diagnosis not present

## 2021-08-19 DIAGNOSIS — E1122 Type 2 diabetes mellitus with diabetic chronic kidney disease: Secondary | ICD-10-CM | POA: Diagnosis not present

## 2021-08-19 DIAGNOSIS — C20 Malignant neoplasm of rectum: Secondary | ICD-10-CM | POA: Diagnosis not present

## 2021-08-19 DIAGNOSIS — N1832 Chronic kidney disease, stage 3b: Secondary | ICD-10-CM | POA: Diagnosis not present

## 2021-08-19 NOTE — Patient Instructions (Addendum)
DUE TO COVID-19 ONLY TWO VISITORS  (aged 67 and older)  ARE ALLOWED TO COME WITH YOU AND STAY IN THE WAITING ROOM ONLY DURING PRE OP AND PROCEDURE.   **NO VISITORS ARE ALLOWED IN THE SHORT STAY AREA OR RECOVERY ROOM!!**  IF YOU WILL BE ADMITTED INTO THE HOSPITAL YOU ARE ALLOWED ONLY FOUR SUPPORT PEOPLE DURING VISITATION HOURS ONLY (7 AM -8PM)   The support person(s) must pass our screening, gel in and out, and wear a mask at all times, including in the patient's room. Patients must also wear a mask when staff or their support person are in the room. Visitors GUEST BADGE MUST BE WORN VISIBLY  One adult visitor may remain with you overnight and MUST be in the room by 8 P.M.     Your procedure is scheduled on: 09-03-21   Report to [facility] Main Entrance    Report to admitting at      [place]  AM   Call this number if you have problems the morning of surgery [phone]   FOLLOW A CLEAR LIQUID DIET THE DAY OF YOUR BOWEL PREP TO PREVENT DEHYDRATION   You may have the following liquids until ___0800 AM THEM NOTHING BY MOUTH  Water Black Coffee (sugar ok, NO MILK/CREAM OR CREAMERS)  Tea (sugar ok, NO MILK/CREAM OR CREAMERS) regular and decaf                             Plain Jell-O (NO RED)                                           Fruit ices (not with fruit pulp, NO RED)                                     Popsicles (NO RED)                                                                  Juice: apple, WHITE grape, WHITE cranberry Sports drinks like Gatorade (NO RED) Clear broth(vegetable,chicken,beef)               Drink 2 G2 drinks AT 10:00 PM the night before surgery.        The day of surgery:  Drink ONE (1) Pre-Surgery  G2 at     0800 AM the morning of surgery. Drink in one sitting. Do not sip.  This drink was given to you during your hospital  pre-op appointment visit. Nothing else to drink after completing the  Pre-Surgery  G2.          If you have questions,  please contact your surgeon's office.   FOLLOW BOWEL PREP AND ANY ADDITIONAL PRE OP INSTRUCTIONS YOU RECEIVED FROM YOUR SURGEON'S OFFICE!!!     Oral Hygiene is also important to reduce your risk of infection.  Remember - BRUSH YOUR TEETH THE MORNING OF SURGERY WITH YOUR REGULAR TOOTHPASTE   Do NOT smoke after Midnight   Take these medicines the morning of surgery with A SIP OF WATER: inhaler (bring it with you), xanax if needed, propranolol, pravastatin, omeprazole  DO NOT TAKE ANY ORAL DIABETIC MEDICATIONS DAY OF YOUR SURGERY  Bring CPAP mask and tubing day of surgery.                              You may not have any metal on your body including hair pins, jewelry, and body piercing             Do not lotions, powders, perfumes/cologne, or deodorant                Men may shave face and neck.   Do not bring valuables to the hospital. [place].   Contacts, dentures or bridgework may not be worn into surgery.   Bring small overnight bag day of surgery.   DO NOT [place]. PHARMACY WILL DISPENSE MEDICATIONS LISTED ON YOUR MEDICATION LIST TO YOU DURING YOUR ADMISSION [place]!    Patients discharged on the day of surgery will not be allowed to drive home.  Someone NEEDS to stay with you for the first 24 hours after anesthesia.               Please read over the following fact sheets you were given: IF YOU HAVE QUESTIONS ABOUT YOUR PRE-OP INSTRUCTIONS PLEASE CALL [phone]     [facility] - Preparing for Surgery Before surgery, you can play an important role.  Because skin is not sterile, your skin needs to be as free of germs as possible.  You can reduce the number of germs on your skin by washing with CHG (chlorahexidine gluconate) soap before surgery.  CHG is an antiseptic cleaner which kills germs and bonds with the skin to continue killing germs  even after washing. Please DO NOT use if you have an allergy to CHG or antibacterial soaps.  If your skin becomes reddened/irritated stop using the CHG and inform your nurse when you arrive at Short Stay. Do not shave (including legs and underarms) for at least 48 hours prior to the first CHG shower.  You may shave your face/neck. Please follow these instructions carefully:  1.  Shower with CHG Soap the night before surgery and the  morning of Surgery.  2.  If you choose to wash your hair, wash your hair first as usual with your  normal  shampoo.  3.  After you shampoo, rinse your hair and body thoroughly to remove the  shampoo.                           4.  Use CHG as you would any other liquid soap.  You can apply chg directly  to the skin and wash                       Gently with a scrungie or clean washcloth.  5.  Apply the CHG Soap to your body ONLY FROM THE NECK DOWN.   Do not use on face/ open  Wound or open sores. Avoid contact with eyes, ears mouth and genitals (private parts).                       Wash face,  Genitals (private parts) with your normal soap.             6.  Wash thoroughly, paying special attention to the area where your surgery  will be performed.  7.  Thoroughly rinse your body with warm water from the neck down.  8.  DO NOT shower/wash with your normal soap after using and rinsing off  the CHG Soap.                9.  Pat yourself dry with a clean towel.            10.  Wear clean pajamas.            11.  Place clean sheets on your bed the night of your first shower and do not  sleep with pets. Day of Surgery : Do not apply any lotions/deodorants the morning of surgery.  Please wear clean clothes to the hospital/surgery center.  FAILURE TO FOLLOW THESE INSTRUCTIONS MAY RESULT IN THE CANCELLATION OF YOUR SURGERY PATIENT SIGNATURE_________________________________  NURSE  SIGNATURE__________________________________  ________________________________________________________________________   William Good  An incentive spirometer is a tool that can help keep your lungs clear and active. This tool measures how well you are filling your lungs with each breath. Taking long deep breaths may help reverse or decrease the chance of developing breathing (pulmonary) problems (especially infection) following: A long period of time when you are unable to move or be active. BEFORE THE PROCEDURE  If the spirometer includes an indicator to show your best effort, your nurse or respiratory therapist will set it to a desired goal. If possible, sit up straight or lean slightly forward. Try not to slouch. Hold the incentive spirometer in an upright position. INSTRUCTIONS FOR USE  Sit on the edge of your bed if possible, or sit up as far as you can in bed or on a chair. Hold the incentive spirometer in an upright position. Breathe out normally. Place the mouthpiece in your mouth and seal your lips tightly around it. Breathe in slowly and as deeply as possible, raising the piston or the ball toward the top of the column. Hold your breath for 3-5 seconds or for as long as possible. Allow the piston or ball to fall to the bottom of the column. Remove the mouthpiece from your mouth and breathe out normally. Rest for a few seconds and repeat Steps 1 through 7 at least 10 times every 1-2 hours when you are awake. Take your time and take a few normal breaths between deep breaths. The spirometer may include an indicator to show your best effort. Use the indicator as a goal to work toward during each repetition. After each set of 10 deep breaths, practice coughing to be sure your lungs are clear. If you have an incision (the cut made at the time of surgery), support your incision when coughing by placing a pillow or rolled up towels firmly against it. Once you are able to get out of  bed, walk around indoors and cough well. You may stop using the incentive spirometer when instructed by your caregiver.  RISKS AND COMPLICATIONS Take your time so you do not get dizzy or light-headed. If you are in pain, you may need to take or  ask for pain medication before doing incentive spirometry. It is harder to take a deep breath if you are having pain. AFTER USE Rest and breathe slowly and easily. It can be helpful to keep track of a log of your progress. Your caregiver can provide you with a simple table to help with this. If you are using the spirometer at home, follow these instructions: [place] IF:  You are having difficultly using the spirometer. You have trouble using the spirometer as often as instructed. Your pain medication is not giving enough relief while using the spirometer. You develop fever of 100.5 F (38.1 C) or higher. SEEK IMMEDIATE MEDICAL CARE IF:  You cough up bloody sputum that had not been present before. You develop fever of 102 F (38.9 C) or greater. You develop worsening pain at or near the incision site. MAKE SURE YOU:  Understand these instructions. Will watch your condition. Will get help right away if you are not doing well or get worse. Document Released: 07/05/2006 Document Revised: 05/17/2011 Document Reviewed: 09/05/2006 [facility] Patient Information 2014 [place], [place].   ________________________________________________________________________

## 2021-08-19 NOTE — Progress Notes (Addendum)
   PCP - Kathrene Bongo Dr. Sheryn Bison Cardiologist -  Dr. Rhae Hammock 03-20-21 epic  PPM/ICD -  Device Orders -  Rep Notified -   Chest x-ray - 10-24-20  EKG - 03-20-21 epic Stress Test -  ECHO - 04-01-21 epic Cardiac Cath -  CMP 08-18-21 epic  Sleep Study -  CPAP -   Fasting Blood Sugar -  Checks Blood Sugar __0___ times a day  Blood Thinner Instructions: Aspirin Instructions:81 mg ASA   Ok to continue  ERAS Protcol - PRE-SURGERY G2-    COVID vaccine -x3 moderna  Activity--Able to complete ADL's without SOB Anesthesia review: DM no meds, HTN, CKD stage2  Patient denies shortness of breath, fever, cough and chest pain at PAT appointment   All instructions explained to the patient, with a verbal understanding of the material. Patient agrees to go over the instructions while at home for a better understanding. Patient also instructed to self quarantine after being tested for COVID-19. The opportunity to ask questions was provided.

## 2021-08-20 ENCOUNTER — Other Ambulatory Visit: Payer: Self-pay | Admitting: Surgery

## 2021-08-20 DIAGNOSIS — C20 Malignant neoplasm of rectum: Secondary | ICD-10-CM

## 2021-08-20 DIAGNOSIS — N183 Chronic kidney disease, stage 3 unspecified: Secondary | ICD-10-CM

## 2021-08-20 DIAGNOSIS — Z932 Ileostomy status: Secondary | ICD-10-CM

## 2021-08-20 DIAGNOSIS — R198 Other specified symptoms and signs involving the digestive system and abdomen: Secondary | ICD-10-CM

## 2021-08-25 ENCOUNTER — Ambulatory Visit
Admission: RE | Admit: 2021-08-25 | Discharge: 2021-08-25 | Disposition: A | Discharge status: Home / Self Care | Payer: Medicare Other | Source: Ambulatory Visit | Attending: Surgery | Admitting: Surgery

## 2021-08-25 DIAGNOSIS — I129 Hypertensive chronic kidney disease with stage 1 through stage 4 chronic kidney disease, or unspecified chronic kidney disease: Secondary | ICD-10-CM | POA: Diagnosis not present

## 2021-08-25 DIAGNOSIS — E1122 Type 2 diabetes mellitus with diabetic chronic kidney disease: Secondary | ICD-10-CM | POA: Diagnosis not present

## 2021-08-25 DIAGNOSIS — K6389 Other specified diseases of intestine: Secondary | ICD-10-CM | POA: Diagnosis not present

## 2021-08-25 DIAGNOSIS — N1832 Chronic kidney disease, stage 3b: Secondary | ICD-10-CM | POA: Diagnosis not present

## 2021-08-25 DIAGNOSIS — R198 Other specified symptoms and signs involving the digestive system and abdomen: Secondary | ICD-10-CM

## 2021-08-25 DIAGNOSIS — Z432 Encounter for attention to ileostomy: Secondary | ICD-10-CM | POA: Diagnosis not present

## 2021-08-25 DIAGNOSIS — N390 Urinary tract infection, site not specified: Secondary | ICD-10-CM | POA: Diagnosis not present

## 2021-08-25 DIAGNOSIS — N183 Chronic kidney disease, stage 3 unspecified: Secondary | ICD-10-CM

## 2021-08-25 DIAGNOSIS — C20 Malignant neoplasm of rectum: Secondary | ICD-10-CM | POA: Diagnosis not present

## 2021-08-25 DIAGNOSIS — Z932 Ileostomy status: Secondary | ICD-10-CM

## 2021-08-26 ENCOUNTER — Encounter (HOSPITAL_COMMUNITY): Payer: Self-pay

## 2021-08-26 ENCOUNTER — Encounter (HOSPITAL_COMMUNITY)
Admission: RE | Admit: 2021-08-26 | Discharge: 2021-08-26 | Disposition: A | Discharge status: Home / Self Care | Payer: Medicare Other | Source: Ambulatory Visit | Attending: Surgery | Admitting: Surgery

## 2021-08-26 ENCOUNTER — Other Ambulatory Visit: Payer: Self-pay

## 2021-08-26 DIAGNOSIS — E1159 Type 2 diabetes mellitus with other circulatory complications: Secondary | ICD-10-CM | POA: Diagnosis not present

## 2021-08-26 DIAGNOSIS — E1122 Type 2 diabetes mellitus with diabetic chronic kidney disease: Secondary | ICD-10-CM | POA: Insufficient documentation

## 2021-08-26 DIAGNOSIS — I129 Hypertensive chronic kidney disease with stage 1 through stage 4 chronic kidney disease, or unspecified chronic kidney disease: Secondary | ICD-10-CM | POA: Insufficient documentation

## 2021-08-26 DIAGNOSIS — Z01812 Encounter for preprocedural laboratory examination: Secondary | ICD-10-CM | POA: Insufficient documentation

## 2021-08-26 DIAGNOSIS — N1831 Chronic kidney disease, stage 3a: Secondary | ICD-10-CM | POA: Diagnosis not present

## 2021-08-26 HISTORY — DX: Gastro-esophageal reflux disease without esophagitis: K21.9

## 2021-08-26 HISTORY — DX: Anxiety disorder, unspecified: F41.9

## 2021-08-26 LAB — GLUCOSE, CAPILLARY: Glucose-Capillary: 183 mg/dL — ABNORMAL HIGH (ref 70–99)

## 2021-08-26 LAB — HEMOGLOBIN A1C
Hgb A1c MFr Bld: 6 % — ABNORMAL HIGH (ref 4.8–5.6)
Mean Plasma Glucose: 125.5 mg/dL

## 2021-08-26 LAB — CBC
HCT: 37.2 % — ABNORMAL LOW (ref 39.0–52.0)
Hemoglobin: 11.3 g/dL — ABNORMAL LOW (ref 13.0–17.0)
MCH: 26.5 pg (ref 26.0–34.0)
MCHC: 30.4 g/dL (ref 30.0–36.0)
MCV: 87.3 fL (ref 80.0–100.0)
Platelets: 296 10*3/uL (ref 150–400)
RBC: 4.26 MIL/uL (ref 4.22–5.81)
RDW: 15.3 % (ref 11.5–15.5)
WBC: 5.9 10*3/uL (ref 4.0–10.5)
nRBC: 0 % (ref 0.0–0.2)

## 2021-08-27 DIAGNOSIS — E1122 Type 2 diabetes mellitus with diabetic chronic kidney disease: Secondary | ICD-10-CM | POA: Diagnosis not present

## 2021-08-27 DIAGNOSIS — N390 Urinary tract infection, site not specified: Secondary | ICD-10-CM | POA: Diagnosis not present

## 2021-08-27 DIAGNOSIS — I129 Hypertensive chronic kidney disease with stage 1 through stage 4 chronic kidney disease, or unspecified chronic kidney disease: Secondary | ICD-10-CM | POA: Diagnosis not present

## 2021-08-27 DIAGNOSIS — Z432 Encounter for attention to ileostomy: Secondary | ICD-10-CM | POA: Diagnosis not present

## 2021-08-27 DIAGNOSIS — N1832 Chronic kidney disease, stage 3b: Secondary | ICD-10-CM | POA: Diagnosis not present

## 2021-08-27 DIAGNOSIS — C20 Malignant neoplasm of rectum: Secondary | ICD-10-CM | POA: Diagnosis not present

## 2021-09-01 DIAGNOSIS — N1832 Chronic kidney disease, stage 3b: Secondary | ICD-10-CM | POA: Diagnosis not present

## 2021-09-01 DIAGNOSIS — N390 Urinary tract infection, site not specified: Secondary | ICD-10-CM | POA: Diagnosis not present

## 2021-09-01 DIAGNOSIS — I129 Hypertensive chronic kidney disease with stage 1 through stage 4 chronic kidney disease, or unspecified chronic kidney disease: Secondary | ICD-10-CM | POA: Diagnosis not present

## 2021-09-01 DIAGNOSIS — E1122 Type 2 diabetes mellitus with diabetic chronic kidney disease: Secondary | ICD-10-CM | POA: Diagnosis not present

## 2021-09-01 DIAGNOSIS — C20 Malignant neoplasm of rectum: Secondary | ICD-10-CM | POA: Diagnosis not present

## 2021-09-01 DIAGNOSIS — Z432 Encounter for attention to ileostomy: Secondary | ICD-10-CM | POA: Diagnosis not present

## 2021-09-02 NOTE — Anesthesia Preprocedure Evaluation (Addendum)
Anesthesia Evaluation  Patient identified by MRN, date of birth, ID band Patient awake    Reviewed: Allergy & Precautions, NPO status , Patient's Chart, lab work & pertinent test results, reviewed documented beta blocker date and time   History of Anesthesia Complications Negative for: history of anesthetic complications  Airway Mallampati: I  TM Distance: >3 FB Neck ROM: Full    Dental no notable dental hx. (+) Dental Advisory Given   Pulmonary neg pulmonary ROS, Patient abstained from smoking., former smoker,    Pulmonary exam normal        Cardiovascular hypertension, Pt. on medications and Pt. on home beta blockers Normal cardiovascular exam  IMPRESSIONS    1. There is severe asymmetric hypertrophy of the apical segments (up to  18 mm) consistent with apical variant hypertrophic cardiomyopathy. There  is concern for an apical aneurysm on this study, however this was a  difficult study. Would recommend cardiac  MRI for better characterization. Left ventricular ejection fraction, by  estimation, is 55 to 60%. The left ventricle has normal function. The left  ventricle has no regional wall motion abnormalities. There is severe  asymmetric left ventricular  hypertrophy of the apical segment. Left ventricular diastolic parameters  are consistent with Grade I diastolic dysfunction (impaired relaxation).  2. Right ventricular systolic function is normal. The right ventricular  size is normal. Tricuspid regurgitation signal is inadequate for assessing  PA pressure.  3. The mitral valve is grossly normal. Trivial mitral valve  regurgitation. No evidence of mitral stenosis.  4. The aortic valve is grossly normal. Aortic valve regurgitation is not  visualized. No aortic stenosis is present.  5. The inferior vena cava is normal in size with greater than 50%  respiratory variability, suggesting right atrial pressure of 3 mmHg.     Neuro/Psych  Headaches, PSYCHIATRIC DISORDERS Anxiety Depression Bipolar Disorder    GI/Hepatic Neg liver ROS, GERD  ,  Endo/Other  diabetes, Poorly Controlled, Type 2  Renal/GU Renal InsufficiencyRenal disease  negative genitourinary   Musculoskeletal negative musculoskeletal ROS (+)   Abdominal   Peds negative pediatric ROS (+)  Hematology  (+) Blood dyscrasia, anemia ,   Anesthesia Other Findings Rectal Cancer Hypertrophic Cardiomyopathy  Reproductive/Obstetrics negative OB ROS                            Anesthesia Physical  Anesthesia Plan  ASA: 3  Anesthesia Plan: General   Post-op Pain Management: Dilaudid IV and Tylenol PO (pre-op)*   Induction: Intravenous  PONV Risk Score and Plan: 4 or greater and Ondansetron, Midazolam, Treatment may vary due to age or medical condition, Dexamethasone and Scopolamine patch - Pre-op  Airway Management Planned: Oral ETT  Additional Equipment:   Intra-op Plan:   Post-operative Plan: Extubation in OR  Informed Consent: I have reviewed the patients History and Physical, chart, labs and discussed the procedure including the risks, benefits and alternatives for the proposed anesthesia with the patient or authorized representative who has indicated his/her understanding and acceptance.     Dental advisory given  Plan Discussed with: Anesthesiologist and CRNA  Anesthesia Plan Comments:        Anesthesia Quick Evaluation

## 2021-09-03 ENCOUNTER — Other Ambulatory Visit: Payer: Self-pay

## 2021-09-03 ENCOUNTER — Inpatient Hospital Stay (HOSPITAL_COMMUNITY)
Admission: RE | Admit: 2021-09-03 | Discharge: 2021-09-07 | DRG: 330 | Disposition: A | Discharge status: Home Health | Payer: Medicare Other | Source: Ambulatory Visit | Attending: Surgery | Admitting: Surgery

## 2021-09-03 ENCOUNTER — Encounter (HOSPITAL_COMMUNITY)
Admission: RE | Disposition: A | Discharge status: Home Health | Payer: Self-pay | Source: Ambulatory Visit | Attending: Surgery

## 2021-09-03 ENCOUNTER — Inpatient Hospital Stay (HOSPITAL_COMMUNITY): Payer: Medicare Other | Admitting: Anesthesiology

## 2021-09-03 DIAGNOSIS — Z432 Encounter for attention to ileostomy: Secondary | ICD-10-CM | POA: Diagnosis not present

## 2021-09-03 DIAGNOSIS — Z87891 Personal history of nicotine dependence: Secondary | ICD-10-CM

## 2021-09-03 DIAGNOSIS — E669 Obesity, unspecified: Secondary | ICD-10-CM | POA: Diagnosis present

## 2021-09-03 DIAGNOSIS — I2510 Atherosclerotic heart disease of native coronary artery without angina pectoris: Secondary | ICD-10-CM | POA: Diagnosis present

## 2021-09-03 DIAGNOSIS — E1122 Type 2 diabetes mellitus with diabetic chronic kidney disease: Secondary | ICD-10-CM | POA: Diagnosis present

## 2021-09-03 DIAGNOSIS — N1832 Chronic kidney disease, stage 3b: Secondary | ICD-10-CM | POA: Diagnosis not present

## 2021-09-03 DIAGNOSIS — E1159 Type 2 diabetes mellitus with other circulatory complications: Secondary | ICD-10-CM | POA: Diagnosis not present

## 2021-09-03 DIAGNOSIS — G43009 Migraine without aura, not intractable, without status migrainosus: Secondary | ICD-10-CM | POA: Diagnosis not present

## 2021-09-03 DIAGNOSIS — F319 Bipolar disorder, unspecified: Secondary | ICD-10-CM | POA: Diagnosis present

## 2021-09-03 DIAGNOSIS — E785 Hyperlipidemia, unspecified: Secondary | ICD-10-CM | POA: Diagnosis not present

## 2021-09-03 DIAGNOSIS — R1310 Dysphagia, unspecified: Secondary | ICD-10-CM | POA: Diagnosis not present

## 2021-09-03 DIAGNOSIS — F418 Other specified anxiety disorders: Secondary | ICD-10-CM

## 2021-09-03 DIAGNOSIS — Z820 Family history of epilepsy and other diseases of the nervous system: Secondary | ICD-10-CM

## 2021-09-03 DIAGNOSIS — I10 Essential (primary) hypertension: Secondary | ICD-10-CM | POA: Diagnosis not present

## 2021-09-03 DIAGNOSIS — I422 Other hypertrophic cardiomyopathy: Secondary | ICD-10-CM | POA: Diagnosis not present

## 2021-09-03 DIAGNOSIS — Z8616 Personal history of COVID-19: Secondary | ICD-10-CM

## 2021-09-03 DIAGNOSIS — Z452 Encounter for adjustment and management of vascular access device: Secondary | ICD-10-CM | POA: Diagnosis not present

## 2021-09-03 DIAGNOSIS — I129 Hypertensive chronic kidney disease with stage 1 through stage 4 chronic kidney disease, or unspecified chronic kidney disease: Secondary | ICD-10-CM | POA: Diagnosis present

## 2021-09-03 DIAGNOSIS — E1165 Type 2 diabetes mellitus with hyperglycemia: Secondary | ICD-10-CM | POA: Diagnosis not present

## 2021-09-03 DIAGNOSIS — I700 Atherosclerosis of aorta: Secondary | ICD-10-CM | POA: Diagnosis present

## 2021-09-03 DIAGNOSIS — F419 Anxiety disorder, unspecified: Secondary | ICD-10-CM | POA: Diagnosis present

## 2021-09-03 DIAGNOSIS — E559 Vitamin D deficiency, unspecified: Secondary | ICD-10-CM | POA: Diagnosis not present

## 2021-09-03 DIAGNOSIS — Z7982 Long term (current) use of aspirin: Secondary | ICD-10-CM | POA: Diagnosis not present

## 2021-09-03 DIAGNOSIS — Z85048 Personal history of other malignant neoplasm of rectum, rectosigmoid junction, and anus: Secondary | ICD-10-CM | POA: Diagnosis not present

## 2021-09-03 DIAGNOSIS — K219 Gastro-esophageal reflux disease without esophagitis: Secondary | ICD-10-CM | POA: Diagnosis not present

## 2021-09-03 DIAGNOSIS — C20 Malignant neoplasm of rectum: Secondary | ICD-10-CM | POA: Diagnosis present

## 2021-09-03 DIAGNOSIS — Z6826 Body mass index (BMI) 26.0-26.9, adult: Secondary | ICD-10-CM

## 2021-09-03 DIAGNOSIS — Z9221 Personal history of antineoplastic chemotherapy: Secondary | ICD-10-CM

## 2021-09-03 DIAGNOSIS — E118 Type 2 diabetes mellitus with unspecified complications: Secondary | ICD-10-CM | POA: Diagnosis present

## 2021-09-03 DIAGNOSIS — G2581 Restless legs syndrome: Secondary | ICD-10-CM | POA: Diagnosis present

## 2021-09-03 DIAGNOSIS — Z79899 Other long term (current) drug therapy: Secondary | ICD-10-CM | POA: Diagnosis not present

## 2021-09-03 DIAGNOSIS — N401 Enlarged prostate with lower urinary tract symptoms: Secondary | ICD-10-CM | POA: Diagnosis present

## 2021-09-03 DIAGNOSIS — Z8249 Family history of ischemic heart disease and other diseases of the circulatory system: Secondary | ICD-10-CM

## 2021-09-03 DIAGNOSIS — N183 Chronic kidney disease, stage 3 unspecified: Secondary | ICD-10-CM | POA: Diagnosis present

## 2021-09-03 HISTORY — PX: PORT-A-CATH REMOVAL: SHX5289

## 2021-09-03 HISTORY — PX: ILEO LOOP DIVERSION: SHX1780

## 2021-09-03 LAB — GLUCOSE, CAPILLARY
Glucose-Capillary: 129 mg/dL — ABNORMAL HIGH (ref 70–99)
Glucose-Capillary: 117 mg/dL — ABNORMAL HIGH (ref 70–99)
Glucose-Capillary: 132 mg/dL — ABNORMAL HIGH (ref 70–99)

## 2021-09-03 SURGERY — CREATION, ILEOSTOMY, LOOP
Anesthesia: General | Laterality: Right

## 2021-09-03 MED ORDER — FENTANYL CITRATE PF 50 MCG/ML IJ SOSY: 25.0000 ug | PREFILLED_SYRINGE | INTRAMUSCULAR | Status: DC | PRN

## 2021-09-03 MED ORDER — BUPIVACAINE-EPINEPHRINE 0.25% -1:200000 IJ SOLN
INTRAMUSCULAR | Status: AC
  Filled 2021-09-03: qty 1

## 2021-09-03 MED ORDER — ALBUMIN HUMAN 5 % IV SOLN: INTRAVENOUS | Status: DC | PRN

## 2021-09-03 MED ORDER — DEXAMETHASONE SODIUM PHOSPHATE 10 MG/ML IJ SOLN
INTRAMUSCULAR | Status: AC
  Filled 2021-09-03: qty 1

## 2021-09-03 MED ORDER — ENOXAPARIN SODIUM 40 MG/0.4ML IJ SOSY
40.0000 mg | PREFILLED_SYRINGE | Freq: Once | INTRAMUSCULAR | Status: AC
  Administered 2021-09-03: 40 mg via SUBCUTANEOUS
  Filled 2021-09-03: qty 0.4

## 2021-09-03 MED ORDER — PROCHLORPERAZINE MALEATE 10 MG PO TABS: 10.0000 mg | ORAL_TABLET | Freq: Four times a day (QID) | ORAL | Status: DC | PRN

## 2021-09-03 MED ORDER — MIDAZOLAM HCL 5 MG/5ML IJ SOLN
INTRAMUSCULAR | Status: DC | PRN
  Administered 2021-09-03: 2 mg via INTRAVENOUS

## 2021-09-03 MED ORDER — SODIUM CHLORIDE 0.9% FLUSH
3.0000 mL | Freq: Two times a day (BID) | INTRAVENOUS | Status: DC
  Administered 2021-09-03 – 2021-09-06 (×6): 3 mL via INTRAVENOUS

## 2021-09-03 MED ORDER — TRAMADOL HCL 50 MG PO TABS: 50.0000 mg | ORAL_TABLET | Freq: Four times a day (QID) | ORAL | 0 refills | Status: DC | PRN

## 2021-09-03 MED ORDER — ONDANSETRON HCL 4 MG/2ML IJ SOLN
INTRAMUSCULAR | Status: AC
  Filled 2021-09-03: qty 2

## 2021-09-03 MED ORDER — HYDROMORPHONE HCL 1 MG/ML IJ SOLN
INTRAMUSCULAR | Status: DC | PRN
  Administered 2021-09-03 (×2): .5 mg via INTRAVENOUS

## 2021-09-03 MED ORDER — METOPROLOL TARTRATE 5 MG/5ML IV SOLN: 5.0000 mg | Freq: Four times a day (QID) | INTRAVENOUS | Status: DC | PRN

## 2021-09-03 MED ORDER — DIPHENHYDRAMINE HCL 12.5 MG/5ML PO ELIX: 12.5000 mg | ORAL_SOLUTION | Freq: Four times a day (QID) | ORAL | Status: DC | PRN

## 2021-09-03 MED ORDER — CHLORHEXIDINE GLUCONATE 0.12 % MT SOLN
15.0000 mL | Freq: Once | OROMUCOSAL | Status: AC
  Administered 2021-09-03: 15 mL via OROMUCOSAL

## 2021-09-03 MED ORDER — METHOCARBAMOL 500 MG PO TABS: 1000.0000 mg | ORAL_TABLET | Freq: Four times a day (QID) | ORAL | Status: DC | PRN

## 2021-09-03 MED ORDER — MELATONIN 3 MG PO TABS
3.0000 mg | ORAL_TABLET | Freq: Every day | ORAL | Status: DC
  Administered 2021-09-03 – 2021-09-06 (×4): 3 mg via ORAL
  Filled 2021-09-03 (×4): qty 1

## 2021-09-03 MED ORDER — RISPERIDONE 0.25 MG PO TABS
0.2500 mg | ORAL_TABLET | Freq: Every day | ORAL | Status: DC
  Administered 2021-09-03 – 2021-09-06 (×4): 0.25 mg via ORAL
  Filled 2021-09-03 (×4): qty 1

## 2021-09-03 MED ORDER — ALPRAZOLAM 1 MG PO TABS
1.0000 mg | ORAL_TABLET | Freq: Two times a day (BID) | ORAL | Status: DC
  Administered 2021-09-03 – 2021-09-07 (×8): 1 mg via ORAL
  Filled 2021-09-03 (×8): qty 1

## 2021-09-03 MED ORDER — SODIUM CHLORIDE 0.9 % IV SOLN
2.0000 g | INTRAVENOUS | Status: AC
  Administered 2021-09-03: 2 g via INTRAVENOUS
  Filled 2021-09-03: qty 2

## 2021-09-03 MED ORDER — PHENYLEPHRINE HCL (PRESSORS) 10 MG/ML IV SOLN
INTRAVENOUS | Status: AC
  Filled 2021-09-03: qty 1

## 2021-09-03 MED ORDER — SIMETHICONE 80 MG PO CHEW: 40.0000 mg | CHEWABLE_TABLET | Freq: Four times a day (QID) | ORAL | Status: DC | PRN

## 2021-09-03 MED ORDER — PROPRANOLOL HCL ER 80 MG PO CP24
80.0000 mg | ORAL_CAPSULE | Freq: Every day | ORAL | Status: DC
  Administered 2021-09-05 – 2021-09-07 (×3): 80 mg via ORAL
  Filled 2021-09-03 (×5): qty 1

## 2021-09-03 MED ORDER — SODIUM CHLORIDE 0.9 % IV SOLN
2.0000 g | Freq: Two times a day (BID) | INTRAVENOUS | Status: AC
  Administered 2021-09-03: 2 g via INTRAVENOUS
  Filled 2021-09-03: qty 2

## 2021-09-03 MED ORDER — BISMUTH SUBSALICYLATE 262 MG/15ML PO SUSP: 30.0000 mL | Freq: Three times a day (TID) | ORAL | Status: DC | PRN

## 2021-09-03 MED ORDER — ACETAMINOPHEN 500 MG PO TABS
1000.0000 mg | ORAL_TABLET | Freq: Once | ORAL | Status: AC
  Administered 2021-09-03: 1000 mg via ORAL
  Filled 2021-09-03: qty 2

## 2021-09-03 MED ORDER — SUGAMMADEX SODIUM 500 MG/5ML IV SOLN
INTRAVENOUS | Status: DC | PRN
  Administered 2021-09-03: 500 mg via INTRAVENOUS

## 2021-09-03 MED ORDER — ORAL CARE MOUTH RINSE: 15.0000 mL | Freq: Once | OROMUCOSAL | Status: AC

## 2021-09-03 MED ORDER — PROPOFOL 10 MG/ML IV BOLUS
INTRAVENOUS | Status: AC
  Filled 2021-09-03: qty 20

## 2021-09-03 MED ORDER — ENALAPRILAT 1.25 MG/ML IV SOLN
0.6250 mg | Freq: Four times a day (QID) | INTRAVENOUS | Status: DC | PRN
  Filled 2021-09-03: qty 1

## 2021-09-03 MED ORDER — METHOCARBAMOL 1000 MG/10ML IJ SOLN: 1000.0000 mg | Freq: Four times a day (QID) | INTRAVENOUS | Status: DC | PRN

## 2021-09-03 MED ORDER — ONDANSETRON HCL 4 MG PO TABS: 4.0000 mg | ORAL_TABLET | Freq: Four times a day (QID) | ORAL | Status: DC | PRN

## 2021-09-03 MED ORDER — CHLORHEXIDINE GLUCONATE CLOTH 2 % EX PADS: 6.0000 | MEDICATED_PAD | Freq: Once | CUTANEOUS | Status: DC

## 2021-09-03 MED ORDER — ACETAMINOPHEN 500 MG PO TABS
1000.0000 mg | ORAL_TABLET | Freq: Four times a day (QID) | ORAL | Status: DC
  Administered 2021-09-03 – 2021-09-07 (×12): 1000 mg via ORAL
  Filled 2021-09-03 (×11): qty 2

## 2021-09-03 MED ORDER — LACTATED RINGERS IV SOLN: INTRAVENOUS | Status: DC | PRN

## 2021-09-03 MED ORDER — HYDROMORPHONE HCL 2 MG/ML IJ SOLN
INTRAMUSCULAR | Status: AC
  Filled 2021-09-03: qty 1

## 2021-09-03 MED ORDER — ONDANSETRON HCL 4 MG/2ML IJ SOLN: 4.0000 mg | Freq: Four times a day (QID) | INTRAMUSCULAR | Status: DC | PRN

## 2021-09-03 MED ORDER — ROCURONIUM BROMIDE 100 MG/10ML IV SOLN
INTRAVENOUS | Status: DC | PRN
  Administered 2021-09-03: 80 mg via INTRAVENOUS

## 2021-09-03 MED ORDER — BUPIVACAINE LIPOSOME 1.3 % IJ SUSP
INTRAMUSCULAR | Status: DC | PRN
  Administered 2021-09-03: 20 mL

## 2021-09-03 MED ORDER — LACTATED RINGERS IV BOLUS: 1000.0000 mL | Freq: Three times a day (TID) | INTRAVENOUS | Status: AC | PRN

## 2021-09-03 MED ORDER — DIPHENHYDRAMINE HCL 50 MG/ML IJ SOLN: 12.5000 mg | Freq: Four times a day (QID) | INTRAMUSCULAR | Status: DC | PRN

## 2021-09-03 MED ORDER — CALCIUM POLYCARBOPHIL 625 MG PO TABS
625.0000 mg | ORAL_TABLET | Freq: Two times a day (BID) | ORAL | Status: DC
  Administered 2021-09-03 – 2021-09-07 (×8): 625 mg via ORAL
  Filled 2021-09-03 (×8): qty 1

## 2021-09-03 MED ORDER — PHENYLEPHRINE 80 MCG/ML (10ML) SYRINGE FOR IV PUSH (FOR BLOOD PRESSURE SUPPORT)
PREFILLED_SYRINGE | INTRAVENOUS | Status: AC
  Filled 2021-09-03: qty 10

## 2021-09-03 MED ORDER — ONDANSETRON HCL 4 MG/2ML IJ SOLN
INTRAMUSCULAR | Status: DC | PRN
  Administered 2021-09-03: 4 mg via INTRAVENOUS

## 2021-09-03 MED ORDER — ENSURE SURGERY PO LIQD
237.0000 mL | Freq: Two times a day (BID) | ORAL | Status: DC
  Administered 2021-09-04 – 2021-09-05 (×3): 237 mL via ORAL

## 2021-09-03 MED ORDER — FENTANYL CITRATE (PF) 100 MCG/2ML IJ SOLN
INTRAMUSCULAR | Status: DC | PRN
  Administered 2021-09-03: 50 ug via INTRAVENOUS
  Administered 2021-09-03: 100 ug via INTRAVENOUS

## 2021-09-03 MED ORDER — ALBUMIN HUMAN 5 % IV SOLN
INTRAVENOUS | Status: AC
  Filled 2021-09-03: qty 250

## 2021-09-03 MED ORDER — TRAMADOL HCL 50 MG PO TABS
50.0000 mg | ORAL_TABLET | Freq: Four times a day (QID) | ORAL | Status: DC | PRN
  Administered 2021-09-05 – 2021-09-06 (×4): 100 mg via ORAL
  Filled 2021-09-03 (×4): qty 2

## 2021-09-03 MED ORDER — MELATONIN 3 MG PO TABS: 3.0000 mg | ORAL_TABLET | Freq: Every evening | ORAL | Status: DC | PRN

## 2021-09-03 MED ORDER — ACETAMINOPHEN 500 MG PO TABS: 1000.0000 mg | ORAL_TABLET | ORAL | Status: DC

## 2021-09-03 MED ORDER — MIRTAZAPINE 30 MG PO TABS
45.0000 mg | ORAL_TABLET | Freq: Every day | ORAL | Status: DC
  Administered 2021-09-03 – 2021-09-06 (×4): 45 mg via ORAL
  Filled 2021-09-03 (×4): qty 1

## 2021-09-03 MED ORDER — PHENYLEPHRINE HCL (PRESSORS) 10 MG/ML IV SOLN
INTRAVENOUS | Status: DC | PRN
  Administered 2021-09-03 (×2): 80 ug via INTRAVENOUS
  Administered 2021-09-03 (×4): 160 ug via INTRAVENOUS

## 2021-09-03 MED ORDER — EPHEDRINE SULFATE (PRESSORS) 50 MG/ML IJ SOLN
INTRAMUSCULAR | Status: DC | PRN
  Administered 2021-09-03 (×2): 5 mg via INTRAVENOUS

## 2021-09-03 MED ORDER — HYDRALAZINE HCL 20 MG/ML IJ SOLN: 10.0000 mg | INTRAMUSCULAR | Status: DC | PRN

## 2021-09-03 MED ORDER — TAMSULOSIN HCL 0.4 MG PO CAPS
0.4000 mg | ORAL_CAPSULE | Freq: Every day | ORAL | Status: DC
  Administered 2021-09-03 – 2021-09-06 (×4): 0.4 mg via ORAL
  Filled 2021-09-03 (×4): qty 1

## 2021-09-03 MED ORDER — SCOPOLAMINE 1 MG/3DAYS TD PT72
1.0000 | MEDICATED_PATCH | Freq: Once | TRANSDERMAL | Status: DC
  Administered 2021-09-03: 1.5 mg via TRANSDERMAL
  Filled 2021-09-03: qty 1

## 2021-09-03 MED ORDER — SODIUM CHLORIDE 0.9 % IR SOLN
Status: DC | PRN
  Administered 2021-09-03: 1000 mL

## 2021-09-03 MED ORDER — SODIUM CHLORIDE 0.9 % IV SOLN: 250.0000 mL | INTRAVENOUS | Status: DC | PRN

## 2021-09-03 MED ORDER — LACTATED RINGERS IV SOLN
INTRAVENOUS | Status: DC
Start: 2021-09-03 — End: 2021-09-04

## 2021-09-03 MED ORDER — ALVIMOPAN 12 MG PO CAPS
12.0000 mg | ORAL_CAPSULE | ORAL | Status: AC
  Administered 2021-09-03: 12 mg via ORAL
  Filled 2021-09-03: qty 1

## 2021-09-03 MED ORDER — PRAVASTATIN SODIUM 40 MG PO TABS
40.0000 mg | ORAL_TABLET | Freq: Every day | ORAL | Status: DC
  Administered 2021-09-04 – 2021-09-07 (×4): 40 mg via ORAL
  Filled 2021-09-03 (×4): qty 1

## 2021-09-03 MED ORDER — ENOXAPARIN SODIUM 40 MG/0.4ML IJ SOSY
40.0000 mg | PREFILLED_SYRINGE | INTRAMUSCULAR | Status: DC
  Administered 2021-09-04 – 2021-09-07 (×4): 40 mg via SUBCUTANEOUS
  Filled 2021-09-03 (×4): qty 0.4

## 2021-09-03 MED ORDER — LIDOCAINE HCL (CARDIAC) PF 100 MG/5ML IV SOSY
PREFILLED_SYRINGE | INTRAVENOUS | Status: DC | PRN
  Administered 2021-09-03: 90 mg via INTRAVENOUS

## 2021-09-03 MED ORDER — MIDAZOLAM HCL 2 MG/2ML IJ SOLN
INTRAMUSCULAR | Status: AC
Start: 2021-09-03 — End: ?
  Filled 2021-09-03: qty 2

## 2021-09-03 MED ORDER — SODIUM CHLORIDE 0.9% FLUSH: 3.0000 mL | INTRAVENOUS | Status: DC | PRN

## 2021-09-03 MED ORDER — DEXAMETHASONE SODIUM PHOSPHATE 10 MG/ML IJ SOLN
INTRAMUSCULAR | Status: DC | PRN
  Administered 2021-09-03: 8 mg via INTRAVENOUS

## 2021-09-03 MED ORDER — PROMETHAZINE HCL 25 MG/ML IJ SOLN: 6.2500 mg | INTRAMUSCULAR | Status: DC | PRN

## 2021-09-03 MED ORDER — BUPIVACAINE LIPOSOME 1.3 % IJ SUSP: 20.0000 mL | Freq: Once | INTRAMUSCULAR | Status: DC

## 2021-09-03 MED ORDER — BUPIVACAINE-EPINEPHRINE 0.25% -1:200000 IJ SOLN
INTRAMUSCULAR | Status: DC | PRN
  Administered 2021-09-03: 50 mL

## 2021-09-03 MED ORDER — ALUM & MAG HYDROXIDE-SIMETH 200-200-20 MG/5ML PO SUSP: 30.0000 mL | Freq: Four times a day (QID) | ORAL | Status: DC | PRN

## 2021-09-03 MED ORDER — ROCURONIUM BROMIDE 10 MG/ML (PF) SYRINGE
PREFILLED_SYRINGE | INTRAVENOUS | Status: AC
  Filled 2021-09-03: qty 10

## 2021-09-03 MED ORDER — VITAMIN D 25 MCG (1000 UNIT) PO TABS
2000.0000 [IU] | ORAL_TABLET | Freq: Every day | ORAL | Status: DC
Start: 2021-09-04 — End: 2021-09-07
  Administered 2021-09-04 – 2021-09-07 (×4): 2000 [IU] via ORAL
  Filled 2021-09-03 (×4): qty 2

## 2021-09-03 MED ORDER — PANTOPRAZOLE SODIUM 40 MG PO TBEC
80.0000 mg | DELAYED_RELEASE_TABLET | Freq: Every day | ORAL | Status: DC
  Administered 2021-09-04 – 2021-09-07 (×4): 80 mg via ORAL
  Filled 2021-09-03 (×4): qty 2

## 2021-09-03 MED ORDER — HYDROMORPHONE HCL 1 MG/ML IJ SOLN
0.5000 mg | INTRAMUSCULAR | Status: DC | PRN
  Administered 2021-09-03 – 2021-09-04 (×3): 1 mg via INTRAVENOUS
  Filled 2021-09-03 (×3): qty 1

## 2021-09-03 MED ORDER — PROCHLORPERAZINE EDISYLATE 10 MG/2ML IJ SOLN: 5.0000 mg | Freq: Four times a day (QID) | INTRAMUSCULAR | Status: DC | PRN

## 2021-09-03 MED ORDER — ENSURE PRE-SURGERY PO LIQD
592.0000 mL | Freq: Once | ORAL | Status: DC
  Filled 2021-09-03: qty 592

## 2021-09-03 MED ORDER — ALVIMOPAN 12 MG PO CAPS
12.0000 mg | ORAL_CAPSULE | Freq: Two times a day (BID) | ORAL | Status: DC
  Administered 2021-09-04 – 2021-09-06 (×6): 12 mg via ORAL
  Filled 2021-09-03 (×6): qty 1

## 2021-09-03 MED ORDER — EPHEDRINE 5 MG/ML INJ
INTRAVENOUS | Status: AC
  Filled 2021-09-03: qty 5

## 2021-09-03 MED ORDER — FENTANYL CITRATE (PF) 250 MCG/5ML IJ SOLN
INTRAMUSCULAR | Status: AC
  Filled 2021-09-03: qty 5

## 2021-09-03 MED ORDER — PHENYLEPHRINE HCL-NACL 20-0.9 MG/250ML-% IV SOLN
INTRAVENOUS | Status: DC | PRN
  Administered 2021-09-03: 50 ug/min via INTRAVENOUS

## 2021-09-03 MED ORDER — LIDOCAINE HCL (PF) 2 % IJ SOLN
INTRAMUSCULAR | Status: AC
  Filled 2021-09-03: qty 15

## 2021-09-03 MED ORDER — LACTATED RINGERS IV SOLN: INTRAVENOUS | Status: DC

## 2021-09-03 MED ORDER — BUPIVACAINE LIPOSOME 1.3 % IJ SUSP
INTRAMUSCULAR | Status: AC
  Filled 2021-09-03: qty 20

## 2021-09-03 MED ORDER — PROPOFOL 10 MG/ML IV BOLUS
INTRAVENOUS | Status: DC | PRN
  Administered 2021-09-03: 90 mg via INTRAVENOUS

## 2021-09-03 MED ORDER — AMISULPRIDE (ANTIEMETIC) 5 MG/2ML IV SOLN: 10.0000 mg | Freq: Once | INTRAVENOUS | Status: DC | PRN

## 2021-09-03 MED ORDER — SUMATRIPTAN SUCCINATE 50 MG PO TABS
100.0000 mg | ORAL_TABLET | Freq: Two times a day (BID) | ORAL | Status: DC | PRN
  Filled 2021-09-03: qty 2

## 2021-09-03 MED ORDER — DRONABINOL 2.5 MG PO CAPS
2.5000 mg | ORAL_CAPSULE | Freq: Every day | ORAL | Status: DC
  Administered 2021-09-04 – 2021-09-07 (×4): 2.5 mg via ORAL
  Filled 2021-09-03 (×4): qty 1

## 2021-09-03 MED ORDER — ENSURE PRE-SURGERY PO LIQD
296.0000 mL | Freq: Once | ORAL | Status: DC
  Filled 2021-09-03: qty 296

## 2021-09-03 MED ORDER — GABAPENTIN 300 MG PO CAPS
300.0000 mg | ORAL_CAPSULE | ORAL | Status: AC
  Administered 2021-09-03: 300 mg via ORAL
  Filled 2021-09-03: qty 1

## 2021-09-03 MED ORDER — ALBUTEROL SULFATE (2.5 MG/3ML) 0.083% IN NEBU
3.0000 mL | INHALATION_SOLUTION | Freq: Four times a day (QID) | RESPIRATORY_TRACT | Status: DC | PRN
Start: 2021-09-03 — End: 2021-09-07

## 2021-09-03 SURGICAL SUPPLY — 79 items
APL PRP STRL LF DISP 70% ISPRP (MISCELLANEOUS) ×2
APL SKNCLS STERI-STRIP NONHPOA (GAUZE/BANDAGES/DRESSINGS) ×2
BAG COUNTER SPONGE SURGICOUNT (BAG) IMPLANT
BAG SPNG CNTER NS LX DISP (BAG)
BENZOIN TINCTURE PRP APPL 2/3 (GAUZE/BANDAGES/DRESSINGS) ×3 IMPLANT
BLADE SURG 15 STRL LF DISP TIS (BLADE) IMPLANT
BLADE SURG 15 STRL SS (BLADE)
BLADE SURG SZ10 CARB STEEL (BLADE) ×3 IMPLANT
CHLORAPREP W/TINT 26 (MISCELLANEOUS) ×3 IMPLANT
CNTNR URN SCR LID CUP LEK RST (MISCELLANEOUS) ×2 IMPLANT
CONT SPEC 4OZ STRL OR WHT (MISCELLANEOUS) ×3
COVER MAYO STAND STRL (DRAPES) ×3 IMPLANT
COVER SURGICAL LIGHT HANDLE (MISCELLANEOUS) ×3 IMPLANT
DRAIN CHANNEL 19F RND (DRAIN) IMPLANT
DRAPE LAPAROSCOPIC ABDOMINAL (DRAPES) ×3 IMPLANT
DRAPE LAPAROTOMY T 102X78X121 (DRAPES) ×3 IMPLANT
DRAPE SHEET LG 3/4 BI-LAMINATE (DRAPES) IMPLANT
DRAPE UTILITY XL STRL (DRAPES) ×3 IMPLANT
DRAPE WARM FLUID 44X44 (DRAPES) ×3 IMPLANT
DRSG IV TEGADERM 3.5X4.5 STRL (GAUZE/BANDAGES/DRESSINGS) ×1 IMPLANT
DRSG OPSITE POSTOP 3X4 (GAUZE/BANDAGES/DRESSINGS) ×1 IMPLANT
DRSG OPSITE POSTOP 4X10 (GAUZE/BANDAGES/DRESSINGS) IMPLANT
DRSG OPSITE POSTOP 4X6 (GAUZE/BANDAGES/DRESSINGS) IMPLANT
DRSG OPSITE POSTOP 4X8 (GAUZE/BANDAGES/DRESSINGS) IMPLANT
DRSG PAD ABDOMINAL 8X10 ST (GAUZE/BANDAGES/DRESSINGS) IMPLANT
DRSG TEGADERM 2-3/8X2-3/4 SM (GAUZE/BANDAGES/DRESSINGS) ×6 IMPLANT
DRSG TEGADERM 4X4.75 (GAUZE/BANDAGES/DRESSINGS) ×3 IMPLANT
ELECT REM PT RETURN 15FT ADLT (MISCELLANEOUS) ×3 IMPLANT
GAUZE 4X4 16PLY ~~LOC~~+RFID DBL (SPONGE) ×3 IMPLANT
GAUZE SPONGE 2X2 8PLY STRL LF (GAUZE/BANDAGES/DRESSINGS) IMPLANT
GAUZE SPONGE 4X4 12PLY STRL (GAUZE/BANDAGES/DRESSINGS) ×3 IMPLANT
GLOVE ECLIPSE 8.0 STRL XLNG CF (GLOVE) ×3 IMPLANT
GLOVE INDICATOR 8.0 STRL GRN (GLOVE) ×3 IMPLANT
GOWN STRL REUS W/ TWL XL LVL3 (GOWN DISPOSABLE) ×8 IMPLANT
GOWN STRL REUS W/TWL XL LVL3 (GOWN DISPOSABLE) ×12
HANDLE SUCTION POOLE (INSTRUMENTS) ×2 IMPLANT
KIT BASIN OR (CUSTOM PROCEDURE TRAY) ×3 IMPLANT
KIT TURNOVER KIT A (KITS) IMPLANT
LEGGING LITHOTOMY PAIR STRL (DRAPES) ×3 IMPLANT
LOOP VESSEL MAXI BLUE (MISCELLANEOUS) IMPLANT
NEEDLE HYPO 22GX1.5 SAFETY (NEEDLE) ×3 IMPLANT
PACK BASIC VI WITH GOWN DISP (CUSTOM PROCEDURE TRAY) ×3 IMPLANT
PACK GENERAL/GYN (CUSTOM PROCEDURE TRAY) ×3 IMPLANT
PANTS MESH DISP LRG (UNDERPADS AND DIAPERS) ×3 IMPLANT
PENCIL SMOKE EVACUATOR (MISCELLANEOUS) IMPLANT
SHEARS HARMONIC 9CM CVD (BLADE) IMPLANT
SPIKE FLUID TRANSFER (MISCELLANEOUS) ×3 IMPLANT
SPONGE GAUZE 2X2 STER 10/PKG (GAUZE/BANDAGES/DRESSINGS) ×1
STAPLER 90 3.5 STAND SLIM (STAPLE) ×3
STAPLER 90 3.5 STD SLIM (STAPLE) IMPLANT
STAPLER PROXIMATE 75MM BLUE (STAPLE) ×1 IMPLANT
STAPLER VISISTAT 35W (STAPLE) ×3 IMPLANT
SUCTION POOLE HANDLE (INSTRUMENTS) ×3
SURGILUBE 2OZ TUBE FLIPTOP (MISCELLANEOUS) ×3 IMPLANT
SUT CHROMIC 2 0 SH (SUTURE) ×3 IMPLANT
SUT CHROMIC 3 0 SH 27 (SUTURE) IMPLANT
SUT MNCRL AB 4-0 PS2 18 (SUTURE) ×4 IMPLANT
SUT PDS AB 1 CT  36 (SUTURE) ×6
SUT PDS AB 1 CT 36 (SUTURE) IMPLANT
SUT PDS AB 1 TP1 96 (SUTURE) IMPLANT
SUT SILK 2 0 (SUTURE) ×3
SUT SILK 2 0 SH CR/8 (SUTURE) ×4 IMPLANT
SUT SILK 2-0 18XBRD TIE 12 (SUTURE) ×2 IMPLANT
SUT SILK 3 0 (SUTURE) ×3
SUT SILK 3 0 SH CR/8 (SUTURE) ×4 IMPLANT
SUT SILK 3-0 18XBRD TIE 12 (SUTURE) ×2 IMPLANT
SUT VIC AB 2-0 SH 27 (SUTURE)
SUT VIC AB 2-0 SH 27X BRD (SUTURE) IMPLANT
SUT VIC AB 2-0 UR6 27 (SUTURE) ×18 IMPLANT
SUT VIC AB 3-0 SH 18 (SUTURE) ×2 IMPLANT
SYR 20ML LL LF (SYRINGE) ×3 IMPLANT
SYR 3ML LL SCALE MARK (SYRINGE) IMPLANT
SYR BULB IRRIG 60ML STRL (SYRINGE) ×3 IMPLANT
TAPE UMBILICAL 1/8 X36 TWILL (MISCELLANEOUS) ×3 IMPLANT
TAPE UMBILICAL 1/8X18 (MISCELLANEOUS) ×1 IMPLANT
TOWEL OR 17X26 10 PK STRL BLUE (TOWEL DISPOSABLE) ×6 IMPLANT
TOWEL OR NON WOVEN STRL DISP B (DISPOSABLE) ×6 IMPLANT
TRAY FOLEY MTR SLVR 16FR STAT (SET/KITS/TRAYS/PACK) IMPLANT
YANKAUER SUCT BULB TIP 10FT TU (MISCELLANEOUS) ×3 IMPLANT

## 2021-09-03 NOTE — Plan of Care (Signed)
  Problem: Respiratory: Goal: Respiratory status will improve Outcome: Progressing   

## 2021-09-03 NOTE — Anesthesia Procedure Notes (Signed)
Procedure Name: Intubation Date/Time: 09/03/2021 11:44 AM  Performed by: Montel Clock, CRNAPre-anesthesia Checklist: Patient identified, Emergency Drugs available, Suction available, Patient being monitored and Timeout performed Patient Re-evaluated:Patient Re-evaluated prior to induction Oxygen Delivery Method: Circle system utilized Preoxygenation: Pre-oxygenation with 100% oxygen Induction Type: IV induction Ventilation: Mask ventilation without difficulty and Oral airway inserted - appropriate to patient size Laryngoscope Size: Mac and 4 Grade View: Grade I Tube type: Oral Tube size: 7.5 mm Number of attempts: 1 Airway Equipment and Method: Stylet Placement Confirmation: ETT inserted through vocal cords under direct vision, positive ETCO2 and breath sounds checked- equal and bilateral Secured at: 23 cm Tube secured with: Tape Dental Injury: Teeth and Oropharynx as per pre-operative assessment

## 2021-09-03 NOTE — Anesthesia Postprocedure Evaluation (Signed)
Anesthesia Post Note  Patient: William Good  Procedure(s) Performed: OPEN TAKEDOWN OF LOOP ILEOSTOMY ANORECTAL EXAM UNDER ANESTHESIA REMOVAL PORT-A-CATH (Right)     Patient location during evaluation: PACU Anesthesia Type: General Level of consciousness: sedated Pain management: pain level controlled Vital Signs Assessment: post-procedure vital signs reviewed and stable Respiratory status: spontaneous breathing and respiratory function stable Cardiovascular status: stable Postop Assessment: no apparent nausea or vomiting Anesthetic complications: no   No notable events documented.  Last Vitals:  Vitals:   09/03/21 1400 09/03/21 1415  BP: 113/69 102/72  Pulse: 66 67  Resp: 12 14  Temp:  (!) 36.4 C  SpO2: 93% 97%    Last Pain:  Vitals:   09/03/21 1415  TempSrc:   PainSc: Asleep                 [name]

## 2021-09-03 NOTE — Transfer of Care (Signed)
Immediate Anesthesia Transfer of Care Note  Patient: William Good  Procedure(s) Performed: OPEN TAKEDOWN OF LOOP ILEOSTOMY ANORECTAL EXAM UNDER ANESTHESIA REMOVAL PORT-A-CATH (Right)  Patient Location: PACU  Anesthesia Type:General  Level of Consciousness: awake, alert , oriented and patient cooperative  Airway & Oxygen Therapy: Patient Spontanous Breathing and Patient connected to face mask oxygen  Post-op Assessment: Report given to RN, Post -op Vital signs reviewed and stable and Patient moving all extremities X 4  Post vital signs: Reviewed and stable  Last Vitals:  Vitals Value Taken Time  BP 117/75 09/03/21 1345  Temp    Pulse 66 09/03/21 1345  Resp 22 09/03/21 1345  SpO2 98 % 09/03/21 1345  Vitals shown include unvalidated device data.  Last Pain:  Vitals:   09/03/21 0955  TempSrc:   PainSc: 0-No pain         Complications: No notable events documented.

## 2021-09-03 NOTE — Discharge Instructions (Addendum)
SURGERY: POST OP INSTRUCTIONS (Surgery for ileostomy takedown)   ######################################################################  EAT Gradually transition to a high fiber diet with a fiber supplement over the next few days after discharge  WALK Walk an hour a day.  Control your pain to do that.    CONTROL PAIN Control pain so that you can walk, sleep, tolerate sneezing/coughing, go up/down stairs.  HAVE A BOWEL MOVEMENT DAILY Keep your bowels regular to avoid problems.  OK to try a laxative to override constipation.  OK to use an antidairrheal to slow down diarrhea.  Call if not better after 2 tries  CALL IF YOU HAVE PROBLEMS/CONCERNS Call if you are still struggling despite following these instructions. Call if you have concerns not answered by these instructions  ######################################################################   DIET Follow a light diet the first few days at home.  Start with a bland diet such as soups, liquids, starchy foods, low fat foods, etc.  If you feel full, bloated, or constipated, stay on a ful liquid or pureed/blenderized diet for a few days until you feel better and no longer constipated. Be sure to drink plenty of fluids every day to avoid getting dehydrated (feeling dizzy, not urinating, etc.). Gradually add a fiber supplement to your diet over the next week.  Gradually get back to a regular solid diet.  Avoid fast food or heavy meals the first week as you are more likely to get nauseated. It is expected for your digestive tract to need a few months to get back to normal.  It is common for your bowel movements and stools to be irregular.  You will have occasional bloating and cramping that should eventually fade away.  Until you are eating solid food normally, off all pain medications, and back to regular activities; your bowels will not be normal. Focus on eating a low-fat, high fiber diet the rest of your life (See Getting to [place], below).  CARE of your INCISION or WOUND  It is good for closed incisions and even open wounds to be washed every day.  Shower every day.  Short baths are fine.  Wash the incisions and wounds clean with soap & water.    You may leave closed incisions open to air if it is dry.   You may cover the incision with clean gauze & replace it after your daily shower for comfort.  TEGADERM & WICKS:  You have clear gauze band-aid dressings over your closed incision(s).  Remove the dressings & shoelace ribbon wicks in your largest incision 3 days after surgery.    If you have an open wound with a wound vac, see wound vac care instructions.    ACTIVITIES as tolerated Start light daily activities --- self-care, walking, climbing stairs-- beginning the day after surgery.  Gradually increase activities as tolerated.  Control your pain to be active.  Stop when you are tired.  Ideally, walk several times a day, eventually an hour a day.   Most people are back to most day-to-day activities in a few weeks.  It takes 4-8 weeks to get back to unrestricted, intense activity. If you can walk 30 minutes without difficulty, it is safe to try more intense activity such as jogging, treadmill, bicycling, low-impact aerobics, swimming, etc. Save the most intensive and strenuous activity for last (Usually 4-8 weeks after surgery) such as sit-ups, heavy lifting, contact sports, etc.  Refrain from any intense heavy lifting or straining until you are off narcotics for pain control.  You will have off days, but things should improve week-by-week. DO NOT PUSH THROUGH PAIN.  Let pain be your guide: If it hurts to do something, don't do it.  Pain is your body warning you to avoid that activity for another week until the pain goes down. You may drive when you are no longer taking narcotic prescription pain medication, you can comfortably wear a seatbelt, and you can safely make sudden turns/stops to protect yourself without  hesitating due to pain. You may have sexual intercourse when it is comfortable. If it hurts to do something, stop.  MEDICATIONS Take your usually prescribed home medications unless otherwise directed.   Blood thinners:  Usually you can restart any strong blood thinners after the second postoperative day.  It is OK to take aspirin right away.     If you are on strong blood thinners (warfarin/Coumadin, Plavix, Xerelto, Eliquis, Pradaxa, etc), discuss with your surgeon, medicine PCP, and/or cardiologist for instructions on when to restart the blood thinner & if blood monitoring is needed (PT/INR blood check, etc).     PAIN CONTROL Pain after surgery or related to activity is often due to strain/injury to muscle, tendon, nerves and/or incisions.  This pain is usually short-term and will improve in a few months.  To help speed the process of healing and to get back to regular activity more quickly, DO THE FOLLOWING THINGS TOGETHER: Increase activity gradually.  DO NOT PUSH THROUGH PAIN Use Ice and/or Heat Try Gentle Massage and/or Stretching Take over the counter pain medication Take Narcotic prescription pain medication for more severe pain  Good pain control = faster recovery.  It is better to take more medicine to be more active than to stay in bed all day to avoid medications.  Increase activity gradually Avoid heavy lifting at first, then increase to lifting as tolerated over the next 6 weeks. Do not "push through" the pain.  Listen to your body and avoid positions and maneuvers than reproduce the pain.  Wait a few days before trying something more intense Walking an hour a day is encouraged to help your body recover faster and more safely.  Start slowly and stop when getting sore.  If you can walk 30 minutes without stopping or pain, you can try more intense activity (running, jogging, aerobics, cycling, swimming, treadmill, sex, sports, weightlifting, etc.) Remember: If it hurts to do it,  then don't do it! Use Ice and/or Heat You will have swelling and bruising around the incisions.  This will take several weeks to resolve. Ice packs or heating pads (6-8 times a day, 30-60 minutes at a time) will help sooth soreness & bruising. Some people prefer to use ice alone, heat alone, or alternate between ice & heat.  Experiment and see what works best for you.  Consider trying ice for the first few days to help decrease swelling and bruising; then, switch to heat to help relax sore spots and speed recovery. Shower every day.  Short baths are fine.  It feels good!  Keep the incisions and wounds clean with soap & water.   Try Gentle Massage and/or Stretching Massage at the area of pain many times a day Stop if you feel pain - do not overdo it Take over the counter pain medication This helps the muscle and nerve tissues become less irritable and calm down faster Choose ONE of the following over-the-counter anti-inflammatory medications: Acetaminophen '500mg'$  tabs (Tylenol) 1-2 pills with every meal and just before bedtime (avoid  if you have liver problems or if you have acetaminophen in you narcotic prescription) Naproxen '220mg'$  tabs (ex. Aleve, Naprosyn) 1-2 pills twice a day (avoid if you have kidney, stomach, IBD, or bleeding problems) Ibuprofen '200mg'$  tabs (ex. Advil, Motrin) 3-4 pills with every meal and just before bedtime (avoid if you have kidney, stomach, IBD, or bleeding problems) Take with food/snack several times a day as directed for at least 2 weeks to help keep pain / soreness down & more manageable. Take Narcotic prescription pain medication for more severe pain A prescription for strong pain control is often given to you upon discharge (for example: oxycodone/Percocet, hydrocodone/Norco/Vicodin, or tramadol/Ultram) Take your pain medication as prescribed. Be mindful that most narcotic prescriptions contain Tylenol (acetaminophen) as well - avoid taking too much Tylenol. If you  are having problems/concerns with the prescription medicine (does not control pain, nausea, vomiting, rash, itching, etc.), please call us [phone] to see if we need to switch you to a different pain medicine that will work better for you and/or control your side effects better. If you need a refill on your pain medication, you must call the office before 4 pm and on weekdays only.  By federal law, prescriptions for narcotics cannot be called into a pharmacy.  They must be filled out on paper & picked up from our office by the patient or authorized caretaker.  Prescriptions cannot be filled after 4 pm nor on weekends.    WHEN TO CALL us [phone] Severe uncontrolled or worsening pain  Fever over 101 F (38.5 C) Concerns with the incision: Worsening pain, redness, rash/hives, swelling, bleeding, or drainage Reactions / problems with new medications (itching, rash, hives, nausea, etc.) Nausea and/or vomiting Difficulty urinating Difficulty breathing Worsening fatigue, dizziness, lightheadedness, blurred vision Other concerns If you are not getting better after two weeks or are noticing you are getting worse, contact our office [phone] for further advice.  We may need to adjust your medications, re-evaluate you in the office, send you to the emergency room, or see what other things we can do to help. The clinic staff is available to answer your questions during regular business hours (8:30am-5pm).  Please don't hesitate to call and ask to speak to one of our nurses for clinical concerns.    A surgeon from [facility] is always on call at the hospitals 24 hours/day If you have a medical emergency, go to the nearest emergency room or call 911.  FOLLOW UP in our office One the day of your discharge from the hospital (or the next business weekday), please call [facility] to set up or confirm an appointment to see your surgeon in the office for a follow-up  appointment.  Usually it is 2-3 weeks after your surgery.   If you have skin staples at your incision(s), let the office know so we can set up a time in the office for the nurse to remove them (usually around 10 days after surgery). Make sure that you call for appointments the day of discharge (or the next business weekday) from the hospital to ensure a convenient appointment time. IF YOU HAVE DISABILITY OR FAMILY LEAVE FORMS, BRING THEM TO THE OFFICE FOR PROCESSING.  DO NOT GIVE THEM TO YOUR DOCTOR.  [facility] [address] [place], [place], [place]  [address] ? [phone] - Main [phone] - [place],  [phone] - Fax [email or web address]    GETTING TO GOOD BOWEL  HEALTH. It is expected for your digestive tract to need a few months to get back to normal.  It is common for your bowel movements and stools to be irregular.  You will have occasional bloating and cramping that should eventually fade away.  Until you are eating solid food normally, off all pain medications, and back to regular activities; your bowels will not be normal.   Avoiding constipation The goal: ONE SOFT BOWEL MOVEMENT A DAY!    Drink plenty of fluids.  Choose water first. TAKE A FIBER SUPPLEMENT EVERY DAY THE REST OF YOUR LIFE During your first week back home, gradually add back a fiber supplement every day Experiment which form you can tolerate.   There are many forms such as powders, tablets, wafers, gummies, etc Psyllium bran (Metamucil), methylcellulose (Citrucel), Miralax or Glycolax, Benefiber, Flax Seed.  Adjust the dose week-by-week (1/2 dose/day to 6 doses a day) until you are moving your bowels 1-2 times a day.  Cut back the dose or try a different fiber product if it is giving you problems such as diarrhea or bloating. Sometimes a laxative is needed to help jump-start bowels if constipated until the fiber supplement can help regulate your bowels.  If you are  tolerating eating & you are farting, it is okay to try a gentle laxative such as double dose MiraLax, prune juice, or Milk of Magnesia.  Avoid using laxatives too often. Stool softeners can sometimes help counteract the constipating effects of narcotic pain medicines.  It can also cause diarrhea, so avoid using for too long. If you are still constipated despite taking fiber daily, eating solids, and a few doses of laxatives, call our office. Controlling diarrhea Try drinking liquids and eating bland foods for a few days to avoid stressing your intestines further. Avoid dairy products (especially milk & ice cream) for a short time.  The intestines often can lose the ability to digest lactose when stressed. Avoid foods that cause gassiness or bloating.  Typical foods include beans and other legumes, cabbage, broccoli, and dairy foods.  Avoid greasy, spicy, fast foods.  Every person has some sensitivity to other foods, so listen to your body and avoid those foods that trigger problems for you. Probiotics (such as active yogurt, Align, etc) may help repopulate the intestines and colon with normal bacteria and calm down a sensitive digestive tract Adding a fiber supplement gradually can help thicken stools by absorbing excess fluid and retrain the intestines to act more normally.  Slowly increase the dose over a few weeks.  Too much fiber too soon can backfire and cause cramping & bloating. It is okay to try and slow down diarrhea with a few doses of antidiarrheal medicines.   Bismuth subsalicylate (ex. Kayopectate, Pepto Bismol) for a few doses can help control diarrhea.  Avoid if pregnant.   Loperamide (Imodium) can slow down diarrhea.  Start with one tablet ('2mg'$ ) first.  Avoid if you are having fevers or severe pain.  ILEOSTOMY PATIENTS WILL HAVE CHRONIC DIARRHEA since their colon is not in use.    Drink plenty of liquids.  You will need to drink even more glasses of water/liquid a day to avoid getting  dehydrated. Record output from your ileostomy.  Expect to empty the bag every 3-4 hours at first.  Most people with a permanent ileostomy empty their bag 4-6 times at the least.   Use antidiarrheal medicine (especially Imodium) several times a day to avoid getting dehydrated.  Start with a  dose at bedtime & breakfast.  Adjust up or down as needed.  Increase antidiarrheal medications as directed to avoid emptying the bag more than 8 times a day (every 3 hours). Work with your wound ostomy nurse to learn care for your ostomy.  See ostomy care instructions. TROUBLESHOOTING IRREGULAR BOWELS 1) Start with a soft & bland diet. No spicy, greasy, or fried foods.  2) Avoid gluten/wheat or dairy products from diet to see if symptoms improve. 3) Miralax 17gm or flax seed mixed in [place]. water or juice-daily. May use 2-4 times a day as needed. 4) Gas-X, Phazyme, etc. as needed for gas & bloating.  5) Prilosec (omeprazole) over-the-counter as needed 6)  Consider probiotics (Align, Activa, etc) to help calm the bowels down  Call your doctor if you are getting worse or not getting better.  Sometimes further testing (cultures, endoscopy, X-ray studies, CT scans, bloodwork, etc.) may be needed to help diagnose and treat the cause of the diarrhea. [facility], [place], [place], [place],   [phone] - Main.    [phone]  - Toll Free.   [phone] - Fax [email or web address]    #################################  Pelvic floor muscle training exercises ("Kegels") can help strengthen the muscles under the uterus, bladder, and bowel (large intestine). They can help both men and women who have problems with urine leakage or bowel control.  A pelvic floor muscle training exercise is like pretending that you have to urinate, and then holding it. You relax and tighten the muscles that control urine flow. It's important to find the right muscles to  tighten.  The next time you have to urinate, start to go and then stop. Feel the muscles in your vagina, bladder, or anus get tight and move up. These are the pelvic floor muscles. If you feel them tighten, you've done the exercise right. If you are still not sure whether you are tightening the right muscles, keep in mind that all of the muscles of the pelvic floor relax and contract at the same time. Because these muscles control the bladder, rectum, and vagina, the following tips may help: Women: Insert a finger into your vagina. Tighten the muscles as if you are holding in your urine, then let go. You should feel the muscles tighten and move up and down.  Men: Insert a finger into your rectum. Tighten the muscles as if you are holding in your urine, then let go. You should feel the muscles tighten and move up and down. These are the same muscles you would tighten if you were trying to prevent yourself from passing gas.  It is very important that you keep the following muscles relaxed while doing pelvic floor muscle training exercises: Abdominal  Buttocks (the deeper, anal sphincter muscle should contract)  Thigh   A woman can also strengthen these muscles by using a vaginal cone, which is a weighted device that is inserted into the vagina. Then you try to tighten the pelvic floor muscles to hold the device in place. If you are unsure whether you are doing the pelvic floor muscle training correctly, you can use biofeedback and electrical stimulation to help find the correct muscle group to work. Biofeedback is a method of positive reinforcement. Electrodes are placed on the abdomen and along the anal area. Some therapists place a sensor in the vagina in women or anus in men to monitor the contraction of pelvic floor muscles.  A monitor will display a  graph showing which muscles are contracting and which are at rest. The therapist can help find the right muscles for performing pelvic floor muscle  training exercises.   PERFORMING PELVIC FLOOR EXERCISES: 1. Begin by emptying your bladder. 2. Tighten the pelvic floor muscles and hold for a count of 10. 3. Relax the muscles completely for a count of 10. 4. Do 10 repititions, 3 to 5 times a day (morning, afternoon, and night). You can do these exercises at any time and any place. Most people prefer to do the exercises while lying down or sitting in a chair. After 4 - 6 weeks, most people notice some improvement. It may take as long as 3 months to see a major change. After a couple of weeks, you can also try doing a single pelvic floor contraction at times when you are likely to leak (for example, while getting out of a chair). A word of caution: Some people feel that they can speed up the progress by increasing the number of repetitions and the frequency of exercises. However, over-exercising can instead cause muscle fatigue and increase urine leakage. If you feel any discomfort in your abdomen or back while doing these exercises, you are probably doing them wrong. Breathe deeply and relax your body when you are doing these exercises. Make sure you are not tightening your stomach, thigh, buttock, or chest muscles. When done the right way, pelvic floor muscle exercises have been shown to be very effective at improving urinary continence.  Pelvic Floor Pain / Incontinence  Do you suffer from pelvic pain or incontinence? Do you have pain in the pelvis, low back or hips that is associated with sitting, walking, urination or intercourse? Have you experienced leaking of urine or feces when coughing, sneezing or laughing? Do you have pain in the pelvic area associated with cancer?  These are conditions that are common with pelvic floor muscle dysfunction. Over time, due to stress, scar tissue, surgeries and the natural course of aging, our muscles may become weak or overstressed and can spasm. This can lead to pain, weakness, incontinence or decreased  quality of life.  Men and women with pelvic floor dysfunction frequently describe:  A "falling out" feeling. Pain or burning in the abdomen, tailbone or perineal area. Constipation or bowel elimination problems or difficulty initiating urination. Unresolved low back or hip pain. Frequency and urgency when going to the bathroom. Leaking of urine or feces. Pain with intercourse.  [email or web address]  To make a referral or for more information about [facility] Pelvic Floor Therapy Program, call  [facility]) - [place] [phone] [place]) - [phone] [facility]) - [phone]   ############################  [place].  ######################################################################  EAT Gradually transition to a high fiber diet with a fiber supplement over the next few weeks after discharge.  Start with a pureed / full liquid diet (see below)  WALK Walk an hour a day.  Control your pain to do that.    HAVE A BOWEL MOVEMENT DAILY Keep your bowels regular to avoid problems.  OK to try a laxative to override constipation.  OK to use an antidairrheal to slow down diarrhea.  Call if not better after 2 tries  CALL IF YOU HAVE PROBLEMS/CONCERNS Call if you are still struggling despite following these instructions. Call if you have concerns not answered by these instructions  ######################################################################   Irregular bowel habits such as constipation and diarrhea can lead to many problems over time.  Having  one soft bowel movement a day is the most important way to prevent further problems.  The anorectal canal is designed to handle stretching and feces to safely manage our ability to get rid of solid waste (feces, poop, stool) out of our body.  BUT, hard  constipated stools can act like ripping concrete bricks and diarrhea can be a burning fire to this very sensitive area of our body, causing inflamed hemorrhoids, anal fissures, increasing risk is perirectal abscesses, abdominal pain/bloating, an making irritable bowel worse.      The goal: ONE SOFT BOWEL MOVEMENT A DAY!  To have soft, regular bowel movements:  Drink plenty of fluids, consider 4-6 tall glasses of water a day.   Take plenty of fiber.  Fiber is the undigested part of plant food that passes into the colon, acting s "natures broom" to encourage bowel motility and movement.  Fiber can absorb and hold large amounts of water. This results in a larger, bulkier stool, which is soft and easier to pass. Work gradually over several weeks up to 6 servings a day of fiber (25g a day even more if needed) in the form of: Vegetables -- Root (potatoes, carrots, turnips), leafy green (lettuce, salad greens, celery, spinach), or cooked high residue (cabbage, broccoli, etc) Fruit -- Fresh (unpeeled skin & pulp), Dried (prunes, apricots, cherries, etc ),  or stewed ( applesauce)  Whole grain breads, pasta, etc (whole wheat)  Bran cereals  Bulking Agents -- This type of water-retaining fiber generally is easily obtained each day by one of the following:  Psyllium bran -- The psyllium plant is remarkable because its ground seeds can retain so much water. This product is available as Metamucil, Konsyl, Effersyllium, Per Diem Fiber, or the less expensive generic preparation in drug and health food stores. Although labeled a laxative, it really is not a laxative.  Methylcellulose -- This is another fiber derived from wood which also retains water. It is available as Citrucel. Polyethylene Glycol - and "artificial" fiber commonly called Miralax or Glycolax.  It is helpful for people with gassy or bloated feelings with regular fiber Flax Seed - a less gassy fiber than psyllium No reading or other relaxing activity  while on the toilet. If bowel movements take longer than 5 minutes, you are too constipated AVOID CONSTIPATION.  High fiber and water intake usually takes care of this.  Sometimes a laxative is needed to stimulate more frequent bowel movements, but  Laxatives are not a good long-term solution as it can wear the colon out.  They can help jump-start bowels if constipated, but should be relied on constantly without discussing with your doctor Osmotics (Milk of Magnesia, Fleets phosphosoda, Magnesium citrate, MiraLax, GoLytely) are safer than  Stimulants (Senokot, Castor Oil, Dulcolax, Ex Lax)    Avoid taking laxatives for more than 7 days in a row.  IF SEVERELY CONSTIPATED, try a Bowel Retraining Program: Do not use laxatives.  Eat a diet high in roughage, such as bran cereals and leafy vegetables.  Drink six (6) ounces of prune or apricot juice each morning.  Eat two (2) large servings of stewed fruit each day.  Take one (1) heaping tablespoon of a psyllium-based bulking agent twice a day. Use sugar-free sweetener when possible to avoid excessive calories.  Eat a normal breakfast.  Set aside 15 minutes after breakfast to sit on the toilet, but do not strain to have a bowel movement.  If you do not have a bowel movement by the  third day, use an enema and repeat the above steps.   CONTROLLING DIARRHEA  TAKE A FIBER SUPPLEMENT (FiberCon or Benefiner soluble fiber) twice a day - to thicken stools by absorbing excess fluid and retrain the intestines to act more normally.  Slowly increase the dose over a few weeks.  Too much fiber too soon can backfire and cause cramping & bloating.  TAKE AN IRON SUPPLEMENT twice a day to naturally constipate your bowels.  Usually ferrous sulfate '325mg'$  twice a day)  TAKE ANTI-DIARRHEAL MEDICINES: Loperamide (Imodium) can slow down diarrhea.  Start with two tablets (= '4mg'$ ) first and then try one tablet every 6 hours.  Can go up to 2 pills four times day (8 pills of '2mg'$   max) Avoid if you are having fevers or severe pain.  If you are not better or start feeling worse, stop all medicines and call your doctor for advice LoMotil (Diphenoxylate / Atropine) is another medicine that can constipate & slow down bowel moevements Pepto Bismol (bismuth) can gently thicken bowels as well  If diarrhea is worse,: drink plenty of liquids and try simpler foods for a few days to avoid stressing your intestines further. Avoid dairy products (especially milk & ice cream) for a short time.  The intestines often can lose the ability to digest lactose when stressed. Avoid foods that cause gassiness or bloating.  Typical foods include beans and other legumes, cabbage, broccoli, and dairy foods.  Every person has some sensitivity to other foods, so listen to our body and avoid those foods that trigger problems for you.Call your doctor if you are getting worse or not better.  Sometimes further testing (cultures, endoscopy, X-ray studies, bloodwork, etc) may be needed to help diagnose and treat the cause of the diarrhea. Take extra anti-diarrheal medicines (maximum is 8 pills of '2mg'$  loperamide a day)  TROUBLESHOOTING IRREGULAR BOWELS 1) Avoid extremes of bowel movements (no bad constipation/diarrhea) 2) Miralax 17gm mixed in 8oz. water or juice-daily. May use BID as needed.  3) Gas-x,Phazyme, etc. as needed for gas & bloating.  4) Soft,bland diet. No spicy,greasy,fried foods.  5) Prilosec over-the-counter as needed  6) May hold gluten/wheat products from diet to see if symptoms improve.  7)  May try probiotics (Align, Activa, etc) to help calm the bowels down 7) If symptoms become worse call back immediately.

## 2021-09-03 NOTE — Op Note (Signed)
09/03/2021  1:21 PM  PATIENT:  William Good  66 y.o. male  Patient Care Team: Aura Dials, MD as PCP - General (Family Medicine) Michael Boston, MD as Consulting Physician (General Surgery) Otis Brace, MD as Consulting Physician (Gastroenterology) Ladell Pier, MD as Consulting Physician (Oncology) Kyung Rudd, MD as Consulting Physician (Radiation Oncology) Royston Bake, RN as Registered Nurse (Oncology) Larey Dresser, MD as Consulting Physician (Cardiology) Lucas Mallow, MD as Consulting Physician (Urology)  PRE-OPERATIVE DIAGNOSIS:  LOOP ILEOSTOMY  FOR FECAL DIVERSION S/P LAR  RECTAL CANCER  POST-OPERATIVE DIAGNOSIS:   LOOP ILEOSTOMY  FOR FECAL DIVERSION S/P LAR  RECTAL CANCER  PROCEDURE:   OPEN TAKEDOWN OF LOOP ILEOSTOMY ANORECTAL EXAM UNDER ANESTHESIA REMOVAL PORT-A-CATH  SURGEON:  Adin Hector, MD  ASSIST:  OR STAFF  ANESTHESIA:   local and general  EBL:  Total I/O In: 950 [I.V.:600; IV Piggyback:350] Out: 50 [Blood:50]  Delay start of Pharmacological VTE agent (>24hrs) due to surgical blood loss or risk of bleeding:  no  DRAINS: none   SPECIMEN:   Ileostomy Port-A-Cath  DISPOSITION OF SPECIMEN:  PATHOLOGY  COUNTS:  YES  PLAN OF CARE: Admit to inpatient   PATIENT DISPOSITION:  PACU - hemodynamically stable.  INDICATION:  Patient requiring diverting loop ileostomy after resection of rectal cancer.  The patient has recovered to the point not requiring fecal diversion.  Requested ostomy takedown.  I felt was appropriate this time. I recommended takedown of ileostomy and examination under anesthesia:   The anatomy & physiology of the digestive tract was discussed. The pathophysiology was discussed. Possibility of remaining with an ostomy permanently was discussed. I offered ostomy takedown. Laparoscopic & open techniques were discussed.   Risks such as bleeding, infection, abscess, leak, reoperation, possible re-ostomy,  injury to other organs, hernia, heart attack, death, and other risks were discussed. I noted a good likelihood this will help address the problem. Goals of post-operative recovery were discussed as well. We will work to minimize complications. Questions were answered. The patient expresses understanding & wishes to proceed with surgery.   OR FINDINGS:   No stricture at rectal anastomosis -rests 4 cm from anal verge.  Anastomosis well healed.   Moderate adhesions of loop ileostomy to abdominal wall. No parastomal hernia.   It is a side-to-side stapled ileoileal anastomosis close to the terminal ileum.   CASE DATA:  Type of patient?: Elective [facility] Private Case  Status of Case? Elective Scheduled  Infection Present At Time Of Surgery (PATOS)?  NO  DESCRIPTION:   Informed consent was confirmed. The patient received IV antibiotics and underwent general anesthesia without any difficulty. The patient was positioned in low litholtomy.SCDs were active during the entire case. Time out done.  I performed examination under anesthesia of the perineum. I can palpate the anastomosis in the neorectum.  Findings as above.  Mild dilation done with a finger.  No evidence of fistula leak or irritation. Sphincter intact.   The chest, abdomen was prepped and draped in a sterile fashion. A surgical timeout confirmed our plan.  I focused on removing the Port-A-Cath.  Did a transverse incision over the scar in the right anterior clavicular space.  Able to remove the catheter part of the portacatheter.  Freed the catheter from its suture attachments to the anterior chest wall.  Remove the remaining stitches.  Hemostasis good.  Wound closed with deep dermal absorbable suture.  Sterile dressing applied.  We then turned attention to the  ileostomy takedown. I proceeded to make a circular incision around the loop ileostomy. The loop ileostomy was freed from adhesions to the subcutaneous and fascial layers of the abdominal  wall using focused cautery and sharp dissection.  I was able to breech safely into the peritoneum. No adhesions felt. We could eviscerate the small bowel just proximal/distal to the loop ileostomy.   We did a side-to-side stapled anastomosis using a GIA-75 stapler.  We then used a TLX 90 to staple off the common defect.   I used interrupted 2-0 silk stitches to close the mesenteric defect transversely and do mesenteo-pexy over the TX staple line.  Another silk suture placed at the crotch of proximal end of the anastomosis for antitension.  Hemostasis was excellent. Ileostomy returned into the peritoneal cavity.  We changed gloves.  We assured hemostasis and the former ostomy wound.  Wound irrigated.  I freed subcutaneous tissues off the anterior rectus fascia.  It seemed to come together better transversely.  I closed the fascial layers with #1 PDS in a running fashion transversely. Skin edges trimmed to remove excess scarring and have healthy edges.    I excised redundant tissue to have a transverse biconcave wound.  I closed this with interrupted suture Vicryl at [facility] and deep dermal 4-0 monocryl sutures.  Placed umbilical tape wicks x2 in the wound to allow deeper drainage..  Sterile dressing placed.   Patient extubated and in recovery room in stable condition.  I discussed postoperative plans for recovery with the patient in the holding area.  Instructions are written.  I discussed operative findings, updated the patient's status, discussed probable steps to recovery, and gave postoperative recommendations to the patient's brother, William Good .  Recommendations were made.  Questions were answered.  He expressed understanding & appreciation.  Adin Hector, MD, FACS, MASCRS Esophageal, Gastrointestinal & Colorectal Surgery Robotic and Minimally Invasive Surgery  [facility], [place]. [address] [place], [place]  [address] [phone] Fax [phone] Main

## 2021-09-03 NOTE — Interval H&P Note (Signed)
History and Physical Interval Note:  09/03/2021 10:30 AM  William Good  has presented today for surgery, with the diagnosis of LOOP ILEOSTOMY.  The various methods of treatment have been discussed with the patient and family. After consideration of risks, benefits and other options for treatment, the patient has consented to   Procedure(s): OPEN TAKEDOWN OF LOOP ILEOSTOMY (N/A) ANORECTAL EXAM UNDER ANESTHESIA (N/A)  REMOVAL OF PORT A CATH  as a surgical intervention.  The patient's history has been reviewed, patient examined, no change in status, stable for surgery.  I have reviewed the patient's chart and labs.  Questions were answered to the patient's satisfaction.    I have re-reviewed the the patient's records, history, medications, and allergies.  I have re-examined the patient.  I again discussed intraoperative plans and goals of post-operative recovery.  The patient agrees to proceed.  William Good  Jul 14, 1955 [id number]  Patient Care Team: Aura Dials, MD as PCP - General (Family Medicine) Michael Boston, MD as Consulting Physician (General Surgery) Otis Brace, MD as Consulting Physician (Gastroenterology) Ladell Pier, MD as Consulting Physician (Oncology) Kyung Rudd, MD as Consulting Physician (Radiation Oncology) Royston Bake, RN as Registered Nurse (Oncology) Larey Dresser, MD as Consulting Physician (Cardiology) Lucas Mallow, MD as Consulting Physician (Urology)  Patient Active Problem List   Diagnosis Date Noted   Rectal cancer [facility]) 09/16/2020    Priority: High   AKI (acute kidney injury) ([place]) 05/28/2021    Priority: Medium    Chronic kidney disease, stage 3 unspecified ([place]) 09/24/2020    Priority: Medium    Complicated UTI (urinary tract infection) 07/13/2021   Weight loss 07/07/2021   Hematuria 07/03/2021   Hypercoagulable state associated with COVID-19 ([place]) 06/29/2021   Severe protein-calorie malnutrition ([place])  06/25/2021   Acute renal failure superimposed on stage 3b chronic kidney disease ([place]) 06/24/2021   COVID-19 with comorbid diabetes mellitus ([place]) 06/17/2021   CRP elevated 06/17/2021   Hypertension associated with stage 3a chronic kidney disease due to type 2 diabetes mellitus ([place]) 06/11/2021   Hyperlipidemia associated with type 2 diabetes mellitus ([place]) 06/11/2021   CKD stage 3 due to type 2 diabetes mellitus ([place]) 06/11/2021   CAD (coronary artery disease) 06/11/2021   Hypertrophic cardiomyopathy ([place]) 06/11/2021   Aortic atherosclerosis ([place]) 06/11/2021   Acute blood loss anemia 06/09/2021   High output ileostomy ([place]) 05/29/2021   Coronary artery calcification 05/28/2021   Bipolar disorder ([place]) 05/27/2021   Polycystic kidney 05/27/2021   Type 2 diabetes mellitus with other circulatory complications ([place]) 93/79/0240   Tobacco abuse 05/27/2021   Rectal adenocarcinoma ([place]) 05/27/2021   Preoperative clearance 03/19/2021   Type 2 diabetes mellitus with complication, without long-term current use of insulin ([place]) 03/19/2021   Gastroesophageal reflux disease 03/17/2021   Hyperlipidemia 03/17/2021   Benign prostatic hyperplasia with lower urinary tract symptoms 03/17/2021   Migraine without aura, not refractory 03/17/2021   Obesity 03/17/2021   Restless legs 03/17/2021   Severe recurrent major depression with psychotic features ([place]) 03/17/2021   Vitamin D deficiency 03/17/2021   Obstruction of left ureteropelvic junction (UPJ) 09/24/2020   Prolapsed internal hemorrhoids, grade 2 09/16/2020   Migraine 09/16/2020   Essential hypertension 01/16/2010    Past Medical History:  Diagnosis Date   Anxiety    Back pain    Bipolar disorder ([place])    bipolar 1   CKD (chronic kidney disease) stage 2, GFR 60-89 ml/min 09/24/2020   GERD (gastroesophageal reflux  disease)    Hypertension    Knee pain    Migraine    Rectal cancer ([place])    Type 2 diabetes mellitus with complication,  without long-term current use of insulin ([place]) 03/19/2021   no meds    Past Surgical History:  Procedure Laterality Date   BACK SURGERY     JOINT REPLACEMENT Right    knee   OSTOMY N/A 05/27/2021   Procedure: POSSIBLE OSTOMY;  Surgeon: Michael Boston, MD;  Location: [facility];  Service: General;  Laterality: N/A;   PORTACATH PLACEMENT N/A 10/02/2020   Procedure: PORT-A-CATH PLACEMENT;  Surgeon: Dwan Bolt, MD;  Location: [facility];  Service: General;  Laterality: N/A;   PROCTOSCOPY N/A 05/27/2021   Procedure: RIGID PROCTOSCOPY;  Surgeon: Michael Boston, MD;  Location: [facility];  Service: General;  Laterality: N/A;   XI ROBOTIC ASSISTED LOWER ANTERIOR RESECTION N/A 05/27/2021   Procedure: ROBOTIC LOW ANTERIOR COLON RESECTION, VERY LOW COLOANAL ANASTOMOSIS, DIVERTING ILEOSTOMY, BILATERAL TAP BLOCK,  FIRFLY INJECTION;  Surgeon: Michael Boston, MD;  Location: [facility];  Service: General;  Laterality: N/A;    Social History   Socioeconomic History   Marital status: Divorced    Spouse name: Not on file   Number of children: Not on file   Years of education: Not on file   Highest education level: Not on file  Occupational History   Not on file  Tobacco Use   Smoking status: Former    Packs/day: 0.50    Years: 45.00    Total pack years: 22.50    Types: Cigarettes    Quit date: 04/22/2021    Years since quitting: 0.3   Smokeless tobacco: Never  Vaping Use   Vaping Use: Never used  Substance and Sexual Activity   Alcohol use: No   Drug use: No   Sexual activity: Not Currently  Other Topics Concern   Not on file  Social History Narrative   Not on file   Social Determinants of Health   Financial Resource Strain: Not on file  Food Insecurity: Not on file  Transportation Needs: Not on file  Physical Activity: Not on file  Stress: Not on file  Social Connections: Not on file  Intimate Partner Violence: Not on file    Family History  Problem Relation Age of Onset   ALS Mother     Heart disease Mother    Parkinson's disease Mother    Heart failure Father     Medications Prior to Admission  Medication Sig Dispense Refill Last Dose   ALPRAZolam (XANAX) 1 MG tablet Take 1 tablet (1 mg total) by mouth 2 (two) times daily. Every morning as needed for anxiety, nerves, and behavior 60 tablet 0 09/03/2021 at 0800   Amino Acids-Protein Hydrolys (FEEDING SUPPLEMENT, PRO-STAT SUGAR FREE 64,) LIQD Take 30 mLs by mouth 3 (three) times daily with meals. Special Instructions: for albumin 2.8 2460 mL 0    aspirin EC 81 MG tablet Take 81 mg by mouth daily. Swallow whole.   09/03/2021 at 0800   Cholecalciferol (VITAMIN D3) 50 MCG (2000 UT) capsule Take 2,000 Units by mouth daily.   09/02/2021   cholestyramine (QUESTRAN) 4 g packet Take 1 packet (4 g total) by mouth 2 (two) times daily. to bulk stool for high output ileostomy 60 each 0    dronabinol (MARINOL) 2.5 MG capsule Take 2.5 mg by mouth daily.      loperamide (IMODIUM) 2 MG capsule Take 1 capsule (2  mg total) by mouth 2 (two) times daily. 30 capsule 0    magnesium oxide (MAG-OX) 400 (240 Mg) MG tablet Take 1 tablet (400 mg total) by mouth 2 (two) times daily. 60 tablet 0 09/02/2021   melatonin 3 MG TABS tablet Take 3 mg by mouth at bedtime.   09/02/2021   mirtazapine (REMERON) 45 MG tablet Take 1 tablet (45 mg total) by mouth at bedtime. 30 tablet 0 09/02/2021   omeprazole (PRILOSEC) 20 MG capsule Take 1 capsule (20 mg total) 2 (two) times daily by mouth. 60 capsule 0 09/03/2021 at 0800   ondansetron (ZOFRAN-ODT) 8 MG disintegrating tablet Take 1 tablet (8 mg total) by mouth every 8 (eight) hours as needed for nausea or vomiting. 20 tablet 0    polycarbophil (FIBERCON) 625 MG tablet Take 1 tablet (625 mg total) by mouth 2 (two) times daily. 60 tablet 5 09/02/2021   potassium chloride SA (KLOR-CON M) 20 MEQ tablet Take 1 tablet (20 mEq total) by mouth 2 (two) times daily. 60 tablet 0 09/02/2021   pravastatin (PRAVACHOL) 40 MG tablet Take 1  tablet (40 mg total) by mouth daily. 30 tablet 0 09/03/2021   propranolol (INNOPRAN XL) 80 MG 24 hr capsule Take 2 capsules (160 mg total) by mouth daily. 60 capsule 0 09/03/2021 at 0800   risperiDONE (RISPERDAL) 0.25 MG tablet Take 0.5 tablets (0.125 mg total) by mouth at bedtime. (Patient taking differently: Take 0.25 mg by mouth at bedtime.) 30 tablet 0 09/02/2021   tamsulosin (FLOMAX) 0.4 MG CAPS capsule Take 1 capsule (0.4 mg total) by mouth at bedtime. 30 capsule 0 09/02/2021   acetaminophen (TYLENOL) 325 MG tablet Take 650 mg by mouth every 6 (six) hours as needed for mild pain.   More than a month   albuterol (VENTOLIN HFA) 108 (90 Base) MCG/ACT inhaler Inhale 2 puffs into the lungs every 6 (six) hours as needed for wheezing or shortness of breath. 8 g 0 More than a month   Heparin Sodium, Porcine, (HEPARIN LOCK FLUSH IJ) syringe; 10 unit/mL; amt: 10 units/mL; intravenous; Twice A Day Special Instructions: Flush port line after each use.      lidocaine-prilocaine (EMLA) cream Apply 1 application topically as directed. Apply 1/2 tablespoon to port-a-cath site 2 hours prior to stick and cover with plastic wrap to numb site (Patient not taking: Reported on 08/19/2021) 30 g 0 Not Taking   Needle, Disp, (HUBER NEEDLE 22GX3/4") 22G X 3/4" MISC 22 x 3/4 "; miscellaneous,Once A Day on Mon Special Instructions: change weekly      NON FORMULARY Diet:Regular Diet with Thin Liquids      Nutritional Supplements (ENSURE ENLIVE PO) Take by mouth. 120 ml with medpass to protect muscle mass and help meet increased proten/energy needs Twice A Day Between Meals      Ostomy Supplies (STOMAHESIVE PROTECTIVE) POWD (ostomy supplies) [OTC]  powder; - ; topical Every Shift - PRN Special Instructions: Apply to stoma periwound qshift prn irritation or excoriation.      sodium chloride 0.9 % SOLN Flush port-a-cath with N/S 12m BID & then follow with the heparin flush. (See order for heparin flush) Twice A Day       SUMAtriptan (IMITREX) 100 MG tablet Take 1 tablet (100 mg total) by mouth 2 (two) times daily as needed for migraine. 10 tablet 0 More than a month    Current Facility-Administered Medications  Medication Dose Route Frequency Provider Last Rate Last Admin   acetaminophen (TYLENOL) tablet 1,000  mg  1,000 mg Oral On Call to OR Michael Boston, MD       bupivacaine liposome (EXPAREL) 1.3 % injection 266 mg  20 mL Infiltration Once Michael Boston, MD       cefoTEtan (CEFOTAN) 2 g in sodium chloride 0.9 % 100 mL IVPB  2 g Intravenous On Call to OR Michael Boston, MD       Chlorhexidine Gluconate Cloth 2 % PADS 6 each  6 each Topical Once Michael Boston, MD       And   Chlorhexidine Gluconate Cloth 2 % PADS 6 each  6 each Topical Once Michael Boston, MD       Derrill Memo ON 09/04/2021] feeding supplement (ENSURE PRE-SURGERY) liquid 296 mL  296 mL Oral Once Michael Boston, MD       feeding supplement (ENSURE PRE-SURGERY) liquid 592 mL  592 mL Oral Once Michael Boston, MD       lactated ringers infusion   Intravenous Continuous Lidia Collum, MD 10 mL/hr at 09/03/21 1012 New Bag at 09/03/21 1012   scopolamine (TRANSDERM-SCOP) 1 MG/3DAYS 1.5 mg  1 patch Transdermal Once Duane Boston, MD   1.5 mg at 09/03/21 1012     Allergies  Allergen Reactions   Nsaids Other (See Comments)    Chronic kidney disease - avoid NSAIDs    BP 116/78   Pulse 76   Temp 97.6 F (36.4 C) (Oral)   Resp 18   Ht 6' (1.829 m)   Wt 88.9 kg   SpO2 100%   BMI 26.58 kg/m   Labs: Results for orders placed or performed during the hospital encounter of 09/03/21 (from the past 48 hour(s))  Glucose, capillary     Status: Abnormal   Collection Time: 09/03/21  9:11 AM  Result Value Ref Range   Glucose-Capillary 129 (H) 70 - 99 mg/dL    Comment: Glucose reference range applies only to samples taken after fasting for at least 8 hours.   Comment 1 Notify RN   Glucose, capillary     Status: Abnormal   Collection Time: 09/03/21 10:17 AM   Result Value Ref Range   Glucose-Capillary 117 (H) 70 - 99 mg/dL    Comment: Glucose reference range applies only to samples taken after fasting for at least 8 hours.    Imaging / Studies: DG BE (COLON)W SINGLE CM (SOL OR THIN BA)  Result Date: 08/25/2021 CLINICAL DATA:  History of rectal cancer resection and primary colorectal anastomosis. EXAM: WATER SOLUBLE CONTRAST ENEMA TECHNIQUE: A gentle rectal examination was performed without signs of obstruction or pain. Subsequently, a Foley catheter was inserted into the rectum and visualized under fluoroscopic imaging passing into the lower rectum in the area of the anastomosis. Subsequent filling of the rectum was performed without balloon inflation. Filling was performed under gravity. FLUOROSCOPY: Radiation Exposure Index (as provided by the fluoroscopic device): 80.7 mGy Kerma COMPARISON:  No recent imaging is available for comparison post surgery. FINDINGS: The area of the anastomosis and expected area below the anastomosis was distended. Mild irregularity in the area of the anastomosis without signs of change throughout the exam. This irregularity is greatest along the LEFT lateral margin with small focal outpouching along the LEFT lateral margin seen only in the AP projection. More proximal colon shows a caliber change at the level of the splenic flexure with mildly diminished caliber of the colon distal to the splenic flexure. The cecum was distended with contrast and contrast filled the distal ileum  passing into the patient's ileostomy bag. IMPRESSION: Mild irregularity at the anastomotic site without definitive signs of leak. Lack of change during the examination argues against evidence of leak. Clear signs of the anastomotic site i.e. suture lines are not seen in there is no postoperative comparison available at this time. CT may be helpful to assess anastomotic configuration and exclude the possibility of a small contained leak though this is felt  less likely based on the lack of change seen on the current study across multiple images. Decreased caliber of the LEFT as compared to the RIGHT colon is of uncertain significance, showing no signs of focal narrowing. Electronically Signed   By: Zetta Bills M.D.   On: 08/25/2021 13:10     .Adin Hector, M.D., F.A.C.S. Gastrointestinal and Minimally Invasive Surgery [facility] [address], [place], [place] [address] [phone] Main / Paging  09/03/2021 10:30 AM    Adin Hector

## 2021-09-03 NOTE — H&P (Addendum)
09/03/2021   REFERRING PHYSICIAN: Oren Beckmann, MD  Patient Care Team: Oren Beckmann, MD as PCP - General (Family Medicine) Johney Maine, Adrian Saran, MD as Consulting Provider (General Surgery) Otis Brace, MD (Gastroenterology) Benay Spice Newman Nickels, MD (Medical Oncology) Marye Round, MD (Radiation Oncology) Miguel Dibble, MD as Consulting Provider (General Surgery) Larey Dresser, MD (Cardiovascular Disease) Santa Genera, MD (Urology)  PROVIDER: Hollace Kinnier, MD  [facility] MRN: [id number] DOB: Apr 17, 1955 DATE OF ENCOUNTER: 08/10/2021  SUBJECTIVE   Chief Complaint: Post Operative Visit   History of Present Illness: FREDDERICK Good is a 66 y.o. male who is seen today  as an office consultation at the request of Dr. Sheryn Bison  for evaluation of Post Operative Visit .  Patient returns status post robotic low anterior resection with coloanal anastomosis and diverting loop ileostomy 05/27/2021.  Pathology showed no residual carcinoma 0 of 19 lymph nodes positive. ypT0 ypN0.  Took over a week but eventually stabilized and was able to discharge 3/30. Ended up going to [facility] skilled nursing facility followed by Dr. Linna Darner and nurse practitioner Nyoka Cowden. The patient had high output from his ileostomy so wrote for IV fluid infusions for the first 6 weeks. Had difficulty with urinary retention. Recommendation to follow-up with Dr. Gloriann Loan with urology to reconsider Foley removal after giving tamsulosin. Had urinary tract infections that improved on antibiotics. He completed 6 weeks and has been off IV fluids as of late May.  He is here with his brother. He is out of the nursing facility. He is taking fiber and Imodium. Emptying the ileostomy bag every few hours. Drinking fluids. Walking some but not aggressively. Appetite not normal but try to keep some food down. No fevers or chills. Not lightheaded. They followed up with urology and the Foley catheter  is out. The overdue to see the cancer center but brother notes patient is due to see Dr. Benay Spice next week  PRIOR NOTE: [id number] Patient returns. Gentleman history with hemorrhoids and rectal bleeding. I did banding many years ago. He came in in the summer and I palpate an obvious mass. Biopsy consistent with cancer. Discussed at GI tumor board. Recommendation made for total neoadjuvant chemotherapy. He was on FOLFOX August-November. Switch to Leeroy Bock XRT treatment starting around Thanksgiving. Finished last dose yesterday, 03/16/2021. Cancer history as below.  Rectal cancer Mass at 6 cm on digital exam/anoscopy 08/25/2020 Colonoscopy 09/05/2020-mid rectal mass-biopsy, adenocarcinoma, no loss of mismatch repair protein expression CT abdomen/pelvis 08/29/2020-small amount of free fluid in the pelvis, mildly enlarged left external iliac node, cystic changes of the left kidney with hydronephrosis and regions of cortical thinning with transition at the left UPJ suggesting chronic left UPJ obstruction MRI pelvis 09/24/2020-tumor 6 cm from the anal verge, 1.8 cm from the internal anal sphincter, T2, 7 mm lymph node in the left mesorectal sheath, N1 Cycle 1 FOLFOX 10/08/2020 Cycle 2 FOLFOX 10/22/2020 Cycle 3 FOLFOX 11/12/2020 (oxaliplatin dose reduced, white cell growth factor support on day of pump discontinuation) Cycle 4 FOLFOX 11/26/2020, 5-FU bolus and infusion dose reduced secondary to diarrhea, Ziextenzo Cycle 5 FOLFOX 12/10/2020, 5-FU bolus held due to diarrhea, Ziextenzo Cycle 6 FOLFOX 12/24/2020, 5-FU bolus held, Ziextenzo Cycle 7 FOLFOX 01/07/2021, oxaliplatin held due to neuropathy, 5-FU bolus held, Ziextenzo held Cycle 8 FOLFOX 01/21/2021, oxaliplatin held due to neuropathy, 5-FU bolus held, Ziextenzo held Radiation/Xeloda 02/02/2021  Patient comes today with his brother. He is moving his bowels about 3 or 4 times a day  occasionally loose. However no more rectal bleeding. No incontinence. He still  smokes but is cut back somewhat. He can walk about 10 minutes before he has to stop feeling short of breath. No exertional chest pain. No problems with urination or defecation. Past history as noted in prior note that I copied from below.  Medical History:  Past Medical History:  Diagnosis Date  Anxiety  GERD (gastroesophageal reflux disease)  History of cancer  Hypertension   Patient Active Problem List  Diagnosis  Unspecified essential hypertension  Shortness of breath  Nonspecific abnormal electrocardiogram (ECG) (EKG)  Chest pain  Rectal cancer (CMS-HCC)  Benign prostatic hyperplasia with lower urinary tract symptoms  Bipolar disorder (CMS-HCC)  Chronic kidney disease, stage 3 unspecified (CMS-HCC)  Gastroesophageal reflux disease  Hyperlipidemia  Migraine without aura, not refractory  Obesity  Obstruction of left ureteropelvic junction (UPJ)  Prolapsed internal hemorrhoids, grade 2  Restless legs  Severe recurrent major depression with psychotic features (CMS-HCC)  Type 2 diabetes mellitus with other circulatory complications (CMS-HCC)  Vitamin D deficiency  Back pain  Ileostomy in place (CMS-HCC)  High output ileostomy (CMS-HCC)   No past surgical history on file.   No Known Allergies  Current Outpatient Medications on File Prior to Visit  Medication Sig Dispense Refill  acetaminophen (TYLENOL) 325 MG tablet Take by mouth  albuterol 90 mcg/actuation inhaler Inhale into the lungs  ALPRAZolam (XANAX) 1 MG tablet Take 1 mg by mouth 2 (two) times daily  aspirin 81 MG EC tablet Take by mouth  calcium polycarbophiL (FIBERCON) 625 mg tablet Take by mouth  cholecalciferol (VITAMIN D3) 2,000 unit capsule Take by mouth  cholestyramine (QUESTRAN) 4 gram oral powder packet Take by mouth  heparin flush,porcine,-0.9NaCl 100 unit/mL Kit syringe; 10 unit/mL; amt: 10 units/mL; intravenous; Twice A Day Special Instructions: Flush port line after each use.  lidocaine-prilocaine  (EMLA) cream Apply topically  loperamide (IMODIUM) 2 mg capsule Take by mouth  mirtazapine (REMERON) 45 MG tablet Take by mouth  omeprazole (PRILOSEC) 20 MG DR capsule Take 20 mg by mouth 2 (two) times daily  ondansetron (ZOFRAN) 8 MG tablet Take 8 mg by mouth every 8 (eight) hours as needed  ostomy supplies Powd (ostomy supplies) [OTC]  powder; - ; topical Every Shift - PRN Special Instructions: Apply to stoma periwound qshift prn irritation or excoriation.  potassium chloride (KLOR-CON) 20 MEQ ER tablet Take 1 tablet by mouth once daily  pravastatin (PRAVACHOL) 40 MG tablet Take 40 mg by mouth once daily  propranoloL (INDERAL LA) 160 mg LA capsule Take 160 mg by mouth once daily  risperiDONE (RISPERDAL) 0.25 MG tablet Take by mouth  SUMAtriptan (IMITREX) 100 MG tablet Take 1/2 to 1 tablet at onset of migraine up to twice a day as needed  tamsulosin (FLOMAX) 0.4 mg capsule TAKE ONE CAPSULE ONCE DAILY AFTER BREAKFAST  azithromycin (ZITHROMAX) 250 MG tablet TAKE 2 TABLETS BY MOUTH ON DAY 1, THEN TAKE 1 TABLET DAILY ON DAYS 2-5 (Patient not taking: Reported on 08/10/2021)  benztropine (COGENTIN) 0.5 MG tablet Take 0.5 mg by mouth at bedtime (Patient not taking: Reported on 08/10/2021)  bisacodyL (DULCOLAX) 5 mg EC tablet Take 4 tablets (20 mg total) by mouth once daily as needed for Constipation for up to 1 dose (Patient not taking: Reported on 08/10/2021) 4 tablet 0  capecitabine (XELODA) 500 MG tablet Take 4 tablets (2048m) by mouth in AM and 3 tablets (15087m in PM. Take with food. Take Monday-Friday. Take only on  days of radiation. (Patient not taking: Reported on 08/10/2021)  HYDROcodone-acetaminophen (NORCO) 5-325 mg tablet Take by mouth (Patient not taking: Reported on 08/10/2021)  melatonin 3 mg tablet Take 3 mg by mouth at bedtime  predniSONE (DELTASONE) 10 MG tablet Take by mouth (Patient not taking: Reported on 08/10/2021)  prochlorperazine (COMPAZINE) 10 MG tablet Take 10 mg by mouth every 6  (six) hours as needed (Patient not taking: Reported on 08/10/2021)  sodium chloride 0.9% infusion Flush port-a-cath with N/S 51m BID & then follow with the heparin flush. (See order for heparin flush) Twice A Day   No current facility-administered medications on file prior to visit.   Family History  Problem Relation Age of Onset  Coronary Artery Disease (Blocked arteries around heart) Father    Social History   Tobacco Use  Smoking Status Every Day  Types: Cigarettes  Smokeless Tobacco Not on file    Social History   Socioeconomic History  Marital status: Single  Tobacco Use  Smoking status: Every Day  Types: Cigarettes  Substance and Sexual Activity  Alcohol use: Not Currently  Drug use: Not Currently   ############################################################  Review of Systems: A complete review of systems (ROS) was obtained from the patient. I have reviewed this information and discussed as appropriate with the patient. See HPI as well for other pertinent ROS.  Constitutional: No fevers, chills, sweats. Weight stable Eyes: No vision changes, No discharge HENT: No sore throats, nasal drainage Lymph: No neck swelling, No bruising easily Pulmonary: No cough, productive sputum CV: No orthopnea, PND Patient walks 10 minutes. No exertional chest/neck/shoulder/arm pain.  GI: No personal nor family history of inflammatory bowel disease, irritable bowel syndrome, allergy such as Celiac Sprue, dietary/dairy problems, colitis, ulcers nor gastritis. No recent sick contacts/gastroenteritis. No travel outside the country. No changes in diet.  Renal: No UTIs, No hematuria Genital: No drainage, bleeding, masses Musculoskeletal: No severe joint pain. Good ROM major joints Skin: No sores or lesions Heme/Lymph: No easy bleeding. No swollen lymph nodes  OBJECTIVE   There were no vitals filed for this visit.   There is no height or weight on file to calculate BMI.  PHYSICAL  EXAM:  Constitutional: Not cachectic. Hygeine adequate. Vitals signs as above.  Eyes: Pupils reactive, normal extraocular movements. Sclera nonicteric Neuro: CN II-XII intact. No major focal sensory defects. No major motor deficits. Lymph: No head/neck/groin lymphadenopathy Psych: Still somewhat slow to respond and mumbling but that is his baseline. Not toxic. No severe agitation. No severe anxiety. Judgment & insight Adequate, Oriented x4, HENT: Normocephalic, Mucus membranes moist. No thrush.  Neck: Supple, No tracheal deviation. No obvious thyromegaly Chest: No pain to chest wall compression. Good respiratory excursion. No audible wheezing CV: Pulses intact. Regular rhythm. No major extremity edema Ext: No obvious deformity or contracture. Edema: Not present. No cyanosis Skin: No major subcutaneous nodules. Warm and dry Musculoskeletal: Severe joint rigidity not present. No obvious clubbing. No digital petechiae. Mobility: no assist device moving easily without restrictions  Abdomen:  Obese ileostomy pink and right side with oatmeal consistently orange effluent in bag. Soft. Nondistended. Nontender. Hernia: Not present. Diastasis recti: Mild supraumbilical midline. No hepatomegaly. No splenomegaly.  Genital/Pelvic: Inguinal hernia: Not present. Inguinal lymph nodes: without lymphadenopathy nor hidradenitis.   Rectal: No perianal leaking or burning    ###################################################################  Labs, Imaging and Diagnostic Testing:  Located in '[place] section of Epic EMR chart  PRIOR [facility] CLINIC NOTES:  WSho SalgueroAppointment: 08/25/2020 10:45 AM Location:  [facility] Patient #: [id number] DOB: 07-24-1955 Single / Language: Cleophus Molt / Race: White Male  History of Present Illness Adin Hector MD; 08/25/2020 12:58 PM) The patient is a 66 year old male who presents with hemorrhoids. Note for "Hemorrhoids": ` ` ` Patient  sent for surgical consultation at the request of Self  Chief Complaint: Worsening hemorrhoids. ` ` Patient returns by himself. I had seen him last year for some rectal bleeding and hemorrhoids. I did banding in 2021 since it did not seem that severe and recommended colonoscopy. He comes in today since he is worried his hemorrhoids are worse. He notes he's having intermittent rectal blood when he wipes. He thinks something is popping out sometimes. Denies any sick contacts or change in his diet but his bowels have become more irregular. Tends to have a lot of small urgent strains to pass just some small hard stools. Tried Maalox for 3 months but felt like he gave him diarrhea. Has not been on a fiber supplement. It sounds like he discussed with his primary care office & they sent a request for colonoscopy last week.  ` ` PRIOR NOTE The patient is a pleasant gentleman with numerous health issues. Bipolar disorder with some intermittent health complaints. Here with his brother who helps take care of the patient. I think the brothers trying to get the patient establish with medical care. Patient notes that he's had intermittent rectal bleeding especially the past 6 months. Feels like he gets itching and irritation with bowel movements. Suspects hemorrhoids. Has tried some over-the-counter medication. Discuss with his primary care physician. Hemorrhoid suspected. Suppositories offered. That helps somewhat but he still struggles. It is been recommended he try a fiber supplement that he had regular bowel movements so does not take anything right now. Claims to move his bowels about 2 or 3 times a day. He's never had a colonoscopy. He does smoke about a pack a day. He gets short of breath he walks more than 10 minutes at a time but has not had any heart attack or strokes. Recently diagnosed with diabetes. Never had any abdominal surgery that he knows of. He has never had any hemorrhoid surgery such as lancing or  banding in the past.  No personal nor family history of GI/colon cancer, inflammatory bowel disease, irritable bowel syndrome, allergy such as Celiac Sprue, dietary/dairy problems, colitis, ulcers nor gastritis. No recent sick contacts/gastroenteritis. No travel outside the country. No changes in diet. No dysphagia to solids or liquids. No significant heartburn or reflux. No melena, hematemesis, coffee ground emesis. No evidence of prior gastric/peptic ulceration.  (Review of systems as stated in this history (HPI) or in the review of systems. Otherwise all other 12 point ROS are negative) ` ` ###########################################`  This patient encounter took minutes today to perform the following: obtain history, perform exam, review outside records, interpret tests & imaging, counsel the patient on their diagnosis; and, document this encounter, including findings & plan in the electronic health record (EHR).  Allergies (Charmella Little, CNA; 08/25/2020 10:55 AM) No Known Drug Allergies [02/11/2020]: Allergies Reconciled   Medication History (Charmella Little, CNA; 08/25/2020 10:55 AM) Hydrocortisone Acetate (25MG Suppository, Rectal) Active. ALPRAZolam (1MG Tablet, Oral) Active. Aspirin (81MG Tablet, Oral) Active. Benazepril-hydroCHLOROthiazide (20-12.5MG Tablet, Oral) Active. Benztropine Mesylate (0.5MG Tablet, Oral) Active. Inderal LA (160MG Capsule ER 24HR, Oral) Active. Mirtazapine (45MG Tablet, Oral) Active. Omeprazole (20MG Capsule ER, Oral) Active. Pravastatin Sodium (40MG Tablet, Oral) Active. risperiDONE (1MG Tablet, Oral) Active. SUMAtriptan  Succinate (100MG Tablet, Oral) Active. OneTouch Verio (w/Device Kit,) Active. OneTouch Delica Lancets Active. EQL Vitamin D3 (50 MCG(2000 UT) Capsule, Oral) Active. Acetaminophen (650MG Tablet, Oral) Active. Medications Reconciled   Physical Exam Adin Hector MD; 08/25/2020 11:42 AM) General Mental Status - Alert. General  Appearance - Not in acute distress, Not Sickly. Orientation - Oriented X3. Hydration - Well hydrated. Voice - Normal.  Integumentary Global Assessment Upon inspection and palpation of skin surfaces of the - Axillae: non-tender, no inflammation or ulceration, no drainage. and Distribution of scalp and body hair is normal. General Characteristics Temperature - normal warmth is noted.  Head and Neck Head - normocephalic, atraumatic with no lesions or palpable masses. Face Global Assessment - atraumatic, no absence of expression. Neck Global Assessment - no abnormal movements, no bruit auscultated on the right, no bruit auscultated on the left, no decreased range of motion, non-tender. Trachea - midline. Thyroid Gland Characteristics - non-tender.  Eye Eyeball - Left - Extraocular movements intact, No Nystagmus - Left. Eyeball - Right - Extraocular movements intact, No Nystagmus - Right. Cornea - Left - No Hazy - Left. Cornea - Right - No Hazy - Right. Sclera/Conjunctiva - Left - No scleral icterus, No Discharge - Left. Sclera/Conjunctiva - Right - No scleral icterus, No Discharge - Right. Pupil - Left - Direct reaction to light normal. Pupil - Right - Direct reaction to light normal.  ENMT Ears Pinna - Left - no drainage observed, no generalized tenderness observed. Pinna - Right - no drainage observed, no generalized tenderness observed. Nose and Sinuses External Inspection of the Nose - no destructive lesion observed. Inspection of the nares - Left - quiet respiration. Inspection of the nares - Right - quiet respiration. Mouth and Throat Lips - Upper Lip - no fissures observed, no pallor noted. Lower Lip - no fissures observed, no pallor noted. Nasopharynx - no discharge present. Oral Cavity/Oropharynx - Tongue - no dryness observed. Oral Mucosa - no cyanosis observed. Hypopharynx - no evidence of airway distress observed.  Chest and Lung Exam Inspection Movements - Normal and  Symmetrical. Accessory muscles - No use of accessory muscles in breathing. Palpation Palpation of the chest reveals - Non-tender. Auscultation Breath sounds - Normal and Clear.  Cardiovascular Auscultation Rhythm - Regular. Murmurs & Other Heart Sounds - Auscultation of the heart reveals - No Murmurs and No Systolic Clicks.  Abdomen Inspection Inspection of the abdomen reveals - No Visible peristalsis and No Abnormal pulsations. Umbilicus - No Bleeding, No Urine drainage. Palpation/Percussion Palpation and Percussion of the abdomen reveal - Soft, Non Tender, No Rebound tenderness, No Rigidity (guarding) and No Cutaneous hyperesthesia. Note: Abdomen morbidly obese with moderate supraumbilical diastases recti. Small umbilical mass consistent with small umbilical hernia - REDUCIBLE. Not distended. No guarding.  Male Genitourinary Sexual Maturity Tanner 5 - Adult hair pattern and Adult penile size and shape. Note: No inguinal hernias. Normal external genitalia. Epididymi, testes, and spermatic cords normal without any masses.  Rectal Note: Please refer to anoscopy.  Skin with some irritation. Normal sphincter tone.  On digital exam at the tip of my finger is a left lateral and anterior polypoid mobile rectal mass. Friable and bleeding. Very suspicious for cancer vs adenomatous polyp. Starts about 6 cm from the sphincters and seems to go at least 8 cm proximally.  Grade 2 internal irritated hemorrhoids. No obvious fissure or fistula or abscess. Prostate smooth without any major masses.  Peripheral Vascular Upper Extremity Inspection - Left - No  Cyanotic nailbeds - Left, Not Ischemic. Inspection - Right - No Cyanotic nailbeds - Right, Not Ischemic.  Neurologic Neurologic evaluation reveals - normal attention span and ability to concentrate, able to name objects and repeat phrases. Appropriate fund of knowledge , normal sensation and normal coordination. Mental Status Affect - not  angry, not paranoid. Cranial Nerves - Normal Bilaterally. Gait - Normal.  Neuropsychiatric Mental status exam performed with findings of - able to articulate well with normal speech/language, rate, volume and coherence, thought content normal with ability to perform basic computations and apply abstract reasoning and no evidence of hallucinations, delusions, obsessions or homicidal/suicidal ideation. Note: Pleasant. Somewhat slow to answer. No evidence of any psychosis or agitation  Musculoskeletal Global Assessment Spine, Ribs and Pelvis - no instability, subluxation or laxity. Right Upper Extremity - no instability, subluxation or laxity.  Lymphatic Head & Neck  General Head & Neck Lymphatics: Bilateral - Description - No Localized lymphadenopathy. Axillary  General Axillary Region: Bilateral - Description - No Localized lymphadenopathy. Femoral & Inguinal  Generalized Femoral & Inguinal Lymphatics: Left - Description - No Localized lymphadenopathy. Right - Description - No Localized lymphadenopathy.  Assessment & Plan Adin Hector MD; 08/25/2020 12:50 PM) RECTAL MASS (K62.89) Impression: Rectal mass obvious on exam today but not last year. Suspicious for rectal cancer.  I think he requires urgent colonoscopy for diagnosis and biopsing. He is too sensitive & this is too proximal for me to try and do in the office here. Plus need to make sure there are no other lesions proximally. He has never had a colonoscopy. He recalls his primary care office was working dissected up but he was not aware of any appointments yet.  I was able to call his primary care office. Dr. Olen Pel was able to answer the phone since Dr. Sheryn Bison was not available. I updated my concerns. He notes that they had convinced the patient to get a colonoscopy & referral was made to [facility] last week. We helped reach out to [facility]. I believe Dr. Alessandra Bevels will be available to see the patient next  week Current Plans Instructed to schedule colonoscopy with a gastroenterologist CARCINOEMBRYONIC ANTIGEN (CEA) (08144) Follow Up - Call [facility] office after tests / studies done to discuss further plans Call back in 1 week with progress ABDOMINAL PAIN IN MALE (R10.9) Impression: Abdominal pain with bloating and bowel changes.. Check labs and CT scan. Current Plans CBC, PLATELETS & AUT DIFF (81856) METABOLIC PANEL, COMPREHENSIVE (31497) CT SCAN OF ABDOMEN AND PELVIS WITH CONTRAST (02637) (Pt needs CT A/P w/contrast URGENTLY to eval. abd. pain/concerned for poss. cancer w/rectal mass felt on exam.) FECAL URGENCY (R15.2) IRREGULAR BOWEL HABITS (R19.8) Impression: Worsening irregular bowel habits especially the past 3 months. I worry that he has a tumor that is causing partial obstruction of her likely urgency.  I recommend he stop any laxatives and try and focus on a fiber supplement such as Metamucil to help thicken and soften his stools. Can add Imodium if needed to slow things down.  He needs urgent colonoscopy.  With suspected tumor and irregular bowel habits and abdominal pain, we'll order a CT scan of abdomen and pelvis as well to rule out any other intra-abdominal concerns. Current Plans Pt Education - [facility]el [facility] (Estefan Pattison) PROLAPSED INTERNAL HEMORRHOIDS, GRADE 2 (K64.1) Impression: Rectal bleeding itching and irritation with inflamed hemorrhoids.  Because they were not obviously prolapsing with no external disease I offered banding. I was able to get bands  on his left lateral and right posterior piles. Right anterior pile would not hold bands very well. He tolerated well with anoscopy.  Fiber supplement and warm soaks. See if the banding will help hemorrhoid shrink down to be less bothersome.  IF he is having worsening or persistent hemorrhoidal symptoms, consider outpatient hemorrhoidal ligation/pexy/hemorrhoidectomy.  I would deftly recommend he consider a colonoscopy since  he is over 31 and rectal bleeding is not normal. He and his brother will consider it.  Signed electronically by Adin Hector, MD (08/25/2020 1:01 PM)  SURGERY NOTES:  Not applicable  PATHOLOGY:  Located in [place] section of Epic EMR chart  Assessment and Plan:  DIAGNOSES:  Diagnoses and all orders for this visit:  Rectal cancer (CMS-HCC)  Stage 3 chronic kidney disease, unspecified whether stage 3a or 3b CKD (CMS-HCC)  Ileostomy in place (CMS-HCC)  High output ileostomy (CMS-HCC)    ASSESSMENT/PLAN  Mid-low rectal cancer status post total neoadjuvant FOLFOX chemotherapy August-November 2022. Chemoradiation therapy with Leeroy Bock. Completed yesterday 03/16/2021.  Status post robotic low anterior rectosigmoid resection with coloanal anastomosis and diverting loop ileostomy 05/27/2021.  ####################################################################################  Patient appears to be gradually recovering.  Standard of care is to consider loop ileostomy takedown after 3 months if no post adjuvant chemotherapy needed. I think he got total neoadjuvant and has had a good pathologic response, so I suspect he will not need further chemotherapy. I would feel better if I heard that from his medical oncologist Dr. Benay Spice. They are due to see him next week.  Contrast enema - no leak at EEA.  The anatomy & physiology of the digestive tract was discussed. The pathophysiology was discussed. Possibility of remaining with an ostomy permanently was discussed. I offered ostomy takedown. Laparoscopic & open techniques were discussed.   Risks such as bleeding, infection, abscess, leak, reoperation, possible re-ostomy, injury to other organs, need for repair of tissues / organs, need for further treatment, hernia, heart attack, death, and other risks were discussed. I noted a good likelihood this will help address the problem. Goals of post-operative recovery were  discussed as well. We will work to minimize complications. Questions were answered. The patient & his brother express understanding & wish to proceed with surgery.  MedOnc okay w Port removal as well - pt wishes that.  I recommended surgery to remove the catheter.  I explained the technique of removal with use of local anesthesia & possible need for more aggressive sedation/anesthesia for patient comfort.    Risks such as bleeding, infection, injury to other organs, need for repair of tissues / organs, need for further treatment, and other risks were discussed.  Post-operative dressing/incision care was discussed.  I noted a good likelihood this will help address the problem.   We will work to minimize complications. Questions were answered.  The patient expresses understanding & wishes to proceed with surgery.     Adin Hector, MD, FACS, MASCRS Esophageal, Gastrointestinal & Colorectal Surgery Robotic and Minimally Invasive Surgery  [facility], [place]  [place]. [address] [place], [place] [address] [phone] Fax [phone] Main  CONTACT INFORMATION:  Weekday (9AM-5PM): Call [facility] main office at [phone]  Weeknight (5PM-9AM) or Weekend/Holiday: Check [email or web address] (password " [email or web address]") for General Surgery [facility] coverage  (Please, do not use SecureChat as it is not reliable communication to operating surgeons for immediate patient care)    09/03/2021

## 2021-09-04 ENCOUNTER — Encounter (HOSPITAL_COMMUNITY): Payer: Self-pay | Admitting: Surgery

## 2021-09-04 LAB — BASIC METABOLIC PANEL
Anion gap: 9 (ref 5–15)
BUN: 12 mg/dL (ref 8–23)
CO2: 26 mmol/L (ref 22–32)
Calcium: 9 mg/dL (ref 8.9–10.3)
Chloride: 104 mmol/L (ref 98–111)
Creatinine, Ser: 1.44 mg/dL — ABNORMAL HIGH (ref 0.61–1.24)
GFR, Estimated: 54 mL/min — ABNORMAL LOW (ref >60)
Glucose, Bld: 179 mg/dL — ABNORMAL HIGH (ref 70–99)
Potassium: 5.5 mmol/L — ABNORMAL HIGH (ref 3.5–5.1)
Sodium: 139 mmol/L (ref 135–145)

## 2021-09-04 LAB — CBC
HCT: 31.6 % — ABNORMAL LOW (ref 39.0–52.0)
Hemoglobin: 9.5 g/dL — ABNORMAL LOW (ref 13.0–17.0)
MCH: 25.9 pg — ABNORMAL LOW (ref 26.0–34.0)
MCHC: 30.1 g/dL (ref 30.0–36.0)
MCV: 86.1 fL (ref 80.0–100.0)
Platelets: 204 10*3/uL (ref 150–400)
RBC: 3.67 MIL/uL — ABNORMAL LOW (ref 4.22–5.81)
RDW: 15.1 % (ref 11.5–15.5)
WBC: 11.9 10*3/uL — ABNORMAL HIGH (ref 4.0–10.5)
nRBC: 0 % (ref 0.0–0.2)

## 2021-09-04 LAB — MAGNESIUM: Magnesium: 1.7 mg/dL (ref 1.7–2.4)

## 2021-09-04 NOTE — Progress Notes (Signed)
William Good [id number] October 27, 1955  CARE TEAM:  PCP: Aura Dials, MD  Outpatient Care Team: Patient Care Team: Aura Dials, MD as PCP - General (Family Medicine) Michael Boston, MD as Consulting Physician (General Surgery) Otis Brace, MD as Consulting Physician (Gastroenterology) Ladell Pier, MD as Consulting Physician (Oncology) Kyung Rudd, MD as Consulting Physician (Radiation Oncology) Royston Bake, RN as Registered Nurse (Oncology) Larey Dresser, MD as Consulting Physician (Cardiology) Lucas Mallow, MD as Consulting Physician (Urology)  Inpatient Treatment Team: Treatment Team: Attending Provider: Michael Boston, MD; Registered Nurse: Lisabeth Pick, RN; Pharmacist: Lenis Noon, [facility]; Utilization Review: Fortino Sic, RN   Problem List:   Principal Problem:   Rectal cancer [facility]) Active Problems:   Chronic kidney disease, stage 3 unspecified ([place])   Essential hypertension   Type 2 diabetes mellitus with complication, without long-term current use of insulin ([place])   Bipolar disorder ([place])   Gastroesophageal reflux disease   Restless legs   Hypertrophic cardiomyopathy ([place])   1 Day Post-Op  09/03/2021  POST-OPERATIVE DIAGNOSIS:   LOOP ILEOSTOMY  FOR FECAL DIVERSION S/P LAR  RECTAL CANCER   PROCEDURE:   OPEN TAKEDOWN OF LOOP ILEOSTOMY ANORECTAL EXAM UNDER ANESTHESIA REMOVAL PORT-A-CATH   SURGEON:  Adin Hector, MD  OR FINDINGS:    No stricture at rectal anastomosis -rests 4 cm from anal verge.  Anastomosis well healed.    Moderate adhesions of loop ileostomy to abdominal wall. No parastomal hernia.    It is a side-to-side stapled ileoileal anastomosis close to the terminal ileum.   Assessment  OK  [facility] Stay = 1 days)  Plan:  -ERAS pathway.  Advance diet gradually.  Multimodal pain control.  Hypertension controlled.  Kidney disease with mildly elevated creatinine but nonoliguric.  Continue  low IV fluid protocol with low threshold to as needed.  Follow urine output and creatinine closely.  Hopefully less of an issue now that his ileostomy has been taken down.  We will see.  Fiber bowel regimen.  At risk for diarrhea especially with high output ileostomy.  Hopefully can thicken up over time.  VTE prophylaxis- SCDs, etc  mobilize as tolerated to help recovery -last physical and Occupational Therapy to see since he seems rather deconditioned.  We will order home health in anticipation of needing that for support.  Disposition:  Disposition:  The patient is from: Home  Anticipate discharge to:  Home with Home Health  Anticipated Date of Discharge is:  July 2,2023    Barriers to discharge:  Pending Clinical improvement (more likely than not)  Patient currently is NOT MEDICALLY STABLE for discharge from the hospital from a surgery standpoint.      I reviewed nursing notes, last 24 h vitals and pain scores, last 48 h intake and output, last 24 h labs and trends, and last 24 h imaging results. I have reviewed this patient's available data, including medical history, events of note, test results, etc as part of my evaluation.  A significant portion of that time was spent in counseling.  Care during the described time interval was provided by me.  This care required moderate level of medical decision making.  09/04/2021    Subjective: (Chief complaint)  Sore.  Tolerated liquids.  Passing some gas.  Nursing just outside room  Objective:  Vital signs:  Vitals:   09/03/21 2127 09/04/21 0153 09/04/21 0500 09/04/21 0523  BP: 111/72 104/72  106/76  Pulse: 75 69  66  Resp: '12 14  18  '$ Temp: (!) 97.5 F (36.4 C) (!) 97.5 F (36.4 C)  97.7 F (36.5 C)  TempSrc: Oral Oral  Oral  SpO2: 100% 99%  100%  Weight:   91.8 kg   Height:        Last BM Date : 09/02/21  Intake/Output   Yesterday:  06/29 0701 - 06/30 0700 In: 2444.2 [P.O.:720; I.V.:1374.2; IV  Piggyback:350] Out: 5681 [Urine:1700; Blood:50] This shift:  No intake/output data recorded.  Bowel function:  Flatus: No  BM:  No  Drain: (No drain)   Physical Exam:  General: Pt awake/alert in no acute distress.  A little tired and worn out but not sickly. Eyes: PERRL, normal EOM.  Sclera clear.  No icterus Neuro: CN II-XII intact w/o focal sensory/motor deficits. Lymph: No head/neck/groin lymphadenopathy Psych:  No delerium/psychosis/paranoia.  Oriented x 4 HENT: Normocephalic, Mucus membranes moist.  No thrush Neck: Supple, No tracheal deviation.  No obvious thyromegaly Chest: No pain to chest wall compression.  Good respiratory excursion.  No audible wheezing CV:  Pulses intact.  Regular rhythm.  No major extremity edema MS: Normal AROM mjr joints.  No obvious deformity  Abdomen: Soft.  Mildy distended.  Mildly tender at incisions only.  No evidence of peritonitis.  No incarcerated hernias.  Ext:   No deformity.  No mjr edema.  No cyanosis Skin: No petechiae / purpurea.  No major sores.  Warm and dry    Results:   Cultures: No results found for this or any previous visit (from the past 720 hour(s)).  Labs: Results for orders placed or performed during the hospital encounter of 09/03/21 (from the past 48 hour(s))  Glucose, capillary     Status: Abnormal   Collection Time: 09/03/21  9:11 AM  Result Value Ref Range   Glucose-Capillary 129 (H) 70 - 99 mg/dL    Comment: Glucose reference range applies only to samples taken after fasting for at least 8 hours.   Comment 1 Notify RN   Glucose, capillary     Status: Abnormal   Collection Time: 09/03/21 10:17 AM  Result Value Ref Range   Glucose-Capillary 117 (H) 70 - 99 mg/dL    Comment: Glucose reference range applies only to samples taken after fasting for at least 8 hours.  Glucose, capillary     Status: Abnormal   Collection Time: 09/03/21  1:45 PM  Result Value Ref Range   Glucose-Capillary 132 (H) 70 - 99 mg/dL     Comment: Glucose reference range applies only to samples taken after fasting for at least 8 hours.  Basic metabolic panel     Status: Abnormal   Collection Time: 09/04/21  4:36 AM  Result Value Ref Range   Sodium 139 135 - 145 mmol/L   Potassium 5.5 (H) 3.5 - 5.1 mmol/L   Chloride 104 98 - 111 mmol/L   CO2 26 22 - 32 mmol/L   Glucose, Bld 179 (H) 70 - 99 mg/dL    Comment: Glucose reference range applies only to samples taken after fasting for at least 8 hours.   BUN 12 8 - 23 mg/dL   Creatinine, Ser 1.44 (H) 0.61 - 1.24 mg/dL   Calcium 9.0 8.9 - 10.3 mg/dL   GFR, Estimated 54 (L) >60 mL/min    Comment: (NOTE) Calculated using the CKD-EPI Creatinine Equation (2021)    Anion gap 9 5 - 15    Comment: Performed at [facility], [place]  [address] [place], [place] [address]  CBC     Status: Abnormal   Collection Time: 09/04/21  4:36 AM  Result Value Ref Range   WBC 11.9 (H) 4.0 - 10.5 K/uL   RBC 3.67 (L) 4.22 - 5.81 MIL/uL   Hemoglobin 9.5 (L) 13.0 - 17.0 g/dL   HCT 31.6 (L) 39.0 - 52.0 %   MCV 86.1 80.0 - 100.0 fL   MCH 25.9 (L) 26.0 - 34.0 pg   MCHC 30.1 30.0 - 36.0 g/dL   RDW 15.1 11.5 - 15.5 %   Platelets 204 150 - 400 K/uL   nRBC 0.0 0.0 - 0.2 %    Comment: Performed at [facility], [place] [address] [place], [place] [address]  Magnesium     Status: None   Collection Time: 09/04/21  4:36 AM  Result Value Ref Range   Magnesium 1.7 1.7 - 2.4 mg/dL    Comment: Performed at [facility], [place] [address], [place] [employer], [place] [address]    Imaging / Studies: No results found.  Medications / Allergies: per chart  Antibiotics: Anti-infectives (From admission, onward)    Start     Dose/Rate Route Frequency Ordered Stop   09/03/21 2345  cefoTEtan (CEFOTAN) 2 g in sodium chloride 0.9 % 100 mL IVPB        2 g 200 mL/hr over 30 Minutes Intravenous Every 12 hours 09/03/21 1509 09/03/21 2316   09/03/21 0900  cefoTEtan (CEFOTAN)  2 g in sodium chloride 0.9 % 100 mL IVPB        2 g 200 mL/hr over 30 Minutes Intravenous On call to O.R. 09/03/21 0858 09/03/21 1200         Note: Portions of this report may have been transcribed using voice recognition software. Every effort was made to ensure accuracy; however, inadvertent computerized transcription errors may be present.   Any transcriptional errors that result from this process are unintentional.    Adin Hector, MD, FACS, MASCRS Esophageal, Gastrointestinal & Colorectal Surgery Robotic and Minimally Invasive Surgery  [facility], [place]  [place]. [address] [place],  [address] [phone] Fax [phone] Main  CONTACT INFORMATION:  Weekday (9AM-5PM): Call [facility] main office at [phone]  Weeknight (5PM-9AM) or Weekend/Holiday: Check [email or web address] (password " [email or web address]") for General Surgery [facility] coverage  (Please, do not use SecureChat as it is not reliable communication to operating surgeons for immediate patient care)      09/04/2021  7:51 AM

## 2021-09-04 NOTE — Progress Notes (Signed)
24 hour chart audit completed 

## 2021-09-04 NOTE — Plan of Care (Signed)
  Problem: Education: Goal: Understanding of discharge needs will improve Outcome: Progressing   Problem: Bowel/Gastric: Goal: Gastrointestinal status for postoperative course will improve Outcome: Progressing   Problem: Clinical Measurements: Goal: Postoperative complications will be avoided or minimized Outcome: Progressing   Problem: Pain Managment: Goal: General experience of comfort will improve Outcome: Progressing

## 2021-09-05 ENCOUNTER — Encounter (HOSPITAL_COMMUNITY): Payer: Self-pay | Admitting: Surgery

## 2021-09-05 LAB — POTASSIUM: Potassium: 3.9 mmol/L (ref 3.5–5.1)

## 2021-09-05 LAB — CREATININE, SERUM
Creatinine, Ser: 1.39 mg/dL — ABNORMAL HIGH (ref 0.61–1.24)
GFR, Estimated: 56 mL/min — ABNORMAL LOW (ref >60)

## 2021-09-05 LAB — HEMOGLOBIN: Hemoglobin: 9 g/dL — ABNORMAL LOW (ref 13.0–17.0)

## 2021-09-05 NOTE — Progress Notes (Addendum)
William Good [id number] [birth year]  CARE TEAM:  PCP: Aura Dials, MD  Outpatient Care Team: Patient Care Team: Aura Dials, MD as PCP - General (Family Medicine) Michael Boston, MD as Consulting Physician (General Surgery) Otis Brace, MD as Consulting Physician (Gastroenterology) Ladell Pier, MD as Consulting Physician (Oncology) Kyung Rudd, MD as Consulting Physician (Radiation Oncology) Royston Bake, RN as Registered Nurse (Oncology) Larey Dresser, MD as Consulting Physician (Cardiology) Lucas Mallow, MD as Consulting Physician (Urology)  Inpatient Treatment Team: Treatment Team: Attending Provider: Michael Boston, MD; Utilization Review: Alease Medina, RN; Physical Therapist: Mathis Fare, PT; Registered Nurse: Dorinda Hill, RN; Occupational Therapist: Lenward Chancellor, OT; Registered Nurse: Marlis Edelson, RN   Problem List:   Principal Problem:   Rectal cancer [facility]) Active Problems:   Chronic kidney disease, stage 3 unspecified ([place])   Essential hypertension   Type 2 diabetes mellitus with complication, without long-term current use of insulin ([place])   Bipolar disorder ([place])   Gastroesophageal reflux disease   Restless legs   Hypertrophic cardiomyopathy ([place])   2 Days Post-Op  09/03/2021  POST-OPERATIVE DIAGNOSIS:   LOOP ILEOSTOMY  FOR FECAL DIVERSION S/P LAR  RECTAL CANCER   PROCEDURE:   OPEN TAKEDOWN OF LOOP ILEOSTOMY ANORECTAL EXAM UNDER ANESTHESIA REMOVAL PORT-A-CATH   SURGEON:  Adin Hector, MD  OR FINDINGS:    No stricture at rectal anastomosis -rests 4 cm from anal verge.  Anastomosis well healed.    Moderate adhesions of loop ileostomy to abdominal wall. No parastomal hernia.    It is a side-to-side stapled ileoileal anastomosis close to the terminal ileum.   Assessment  OK  [facility] Stay = 2 days)  Plan:  -ERAS pathway.  Advance diet gradually.  Try mechanically soft diet since I  do not think his dentition is great.  He has some baseline grogginess with his psych and other issues.  See if speech therapy can evaluate and make sure he does not have any major aspiration risk.  Multimodal pain control.  Hypertension controlled.  History of diabetes but not quite a medical therapy.  No major hypoglycemia.  Follow.  Kidney disease with mildly elevated creatinine but nonoliguric.  Continue low IV fluid protocol with low threshold to as needed.  Follow urine output and creatinine closely.  Hopefully less of an issue now that his ileostomy has been taken down.  We will see.  Fiber bowel regimen.  At risk for diarrhea especially with high output ileostomy.  Hopefully can thicken up over time.  VTE prophylaxis- SCDs, etc  mobilize as tolerated to help recovery -awaiting input from physical and Occupational Therapy to see since he seems rather deconditioned.  We will order home health in anticipation of needing that for support.  Disposition:  Disposition:  The patient is from: Home  Anticipate discharge to:  Home with Home Health  Anticipated Date of Discharge is:  July 2,2023    Barriers to discharge:  Pending Clinical improvement (more likely than not)  Patient currently is NOT MEDICALLY STABLE for discharge from the hospital from a surgery standpoint.      I reviewed nursing notes, last 24 h vitals and pain scores, last 48 h intake and output, last 24 h labs and trends, and last 24 h imaging results. I have reviewed this patient's available data, including medical history, events of note, test results, etc as part of my evaluation.  A significant portion of that time was  spent in counseling.  Care during the described time interval was provided by me.  This care required moderate level of medical decision making.  09/05/2021    Subjective: (Chief complaint)  Tired but feeling little better.  Tolerated some thicker liquids.  No flatus or bowel movement  yet.  Objective:  Vital signs:  Vitals:   09/04/21 1743 09/04/21 2033 09/05/21 0444 09/05/21 0500  BP: 102/73 120/74 118/70   Pulse: 64 74 72   Resp: '16 16 16   '$ Temp: (!) 97.5 F (36.4 C) 98.4 F (36.9 C) 97.6 F (36.4 C)   TempSrc: Oral Oral Oral   SpO2: 95% 97% 95%   Weight:    91 kg  Height:        Last BM Date : 09/02/21  Intake/Output   Yesterday:  06/30 0701 - 07/01 0700 In: 1844.2 [P.O.:1680; I.V.:164.2] Out: 1100 [Urine:1100] This shift:  No intake/output data recorded.  Bowel function:  Flatus: No  BM:  No  Drain: (No drain)   Physical Exam:  General: Pt awake/alert in no acute distress.  A little tired and worn out but not sickly. Eyes: PERRL, normal EOM.  Sclera clear.  No icterus Neuro: CN II-XII intact w/o focal sensory/motor deficits. Lymph: No head/neck/groin lymphadenopathy Psych:  No delerium/psychosis/paranoia.  Oriented x 4 HENT: Normocephalic, Mucus membranes moist.  No thrush Neck: Supple, No tracheal deviation.  No obvious thyromegaly Chest: No pain to chest wall compression.  Good respiratory excursion.  No audible wheezing CV:  Pulses intact.  Regular rhythm.  No major extremity edema MS: Normal AROM mjr joints.  No obvious deformity  Abdomen: Soft.  Nondistended.  Nontender.  Former ostomy dressing clean dry intact.  I remove dressing and wicks.  Minimal drainage.  No evidence of peritonitis.  No incarcerated hernias.  Ext:   No deformity.  No mjr edema.  No cyanosis Skin: No petechiae / purpurea.  No major sores.  Warm and dry    Results:   Cultures: No results found for this or any previous visit (from the past 720 hour(s)).  Labs: Results for orders placed or performed during the hospital encounter of 09/03/21 (from the past 48 hour(s))  Glucose, capillary     Status: Abnormal   Collection Time: 09/03/21  9:11 AM  Result Value Ref Range   Glucose-Capillary 129 (H) 70 - 99 mg/dL    Comment: Glucose reference range applies  only to samples taken after fasting for at least 8 hours.   Comment 1 Notify RN   Glucose, capillary     Status: Abnormal   Collection Time: 09/03/21 10:17 AM  Result Value Ref Range   Glucose-Capillary 117 (H) 70 - 99 mg/dL    Comment: Glucose reference range applies only to samples taken after fasting for at least 8 hours.  Glucose, capillary     Status: Abnormal   Collection Time: 09/03/21  1:45 PM  Result Value Ref Range   Glucose-Capillary 132 (H) 70 - 99 mg/dL    Comment: Glucose reference range applies only to samples taken after fasting for at least 8 hours.  Basic metabolic panel     Status: Abnormal   Collection Time: 09/04/21  4:36 AM  Result Value Ref Range   Sodium 139 135 - 145 mmol/L   Potassium 5.5 (H) 3.5 - 5.1 mmol/L   Chloride 104 98 - 111 mmol/L   CO2 26 22 - 32 mmol/L   Glucose, Bld 179 (H) 70 - 99 mg/dL  Comment: Glucose reference range applies only to samples taken after fasting for at least 8 hours.   BUN 12 8 - 23 mg/dL   Creatinine, Ser 1.44 (H) 0.61 - 1.24 mg/dL   Calcium 9.0 8.9 - 10.3 mg/dL   GFR, Estimated 54 (L) >60 mL/min    Comment: (NOTE) Calculated using the CKD-EPI Creatinine Equation (2021)    Anion gap 9 5 - 15    Comment: Performed at [facility], [place] [address] [place], [place] [address]  CBC     Status: Abnormal   Collection Time: 09/04/21  4:36 AM  Result Value Ref Range   WBC 11.9 (H) 4.0 - 10.5 K/uL   RBC 3.67 (L) 4.22 - 5.81 MIL/uL   Hemoglobin 9.5 (L) 13.0 - 17.0 g/dL   HCT 31.6 (L) 39.0 - 52.0 %   MCV 86.1 80.0 - 100.0 fL   MCH 25.9 (L) 26.0 - 34.0 pg   MCHC 30.1 30.0 - 36.0 g/dL   RDW 15.1 11.5 - 15.5 %   Platelets 204 150 - 400 K/uL   nRBC 0.0 0.0 - 0.2 %    Comment: Performed at [facility], [place] [address] [place], [place] [address]  Magnesium     Status: None   Collection Time: 09/04/21  4:36 AM  Result Value Ref Range   Magnesium 1.7 1.7 - 2.4 mg/dL    Comment: Performed at  [facility], [place] [address] [place], [place] [address]  Hemoglobin     Status: Abnormal   Collection Time: 09/05/21 12:16 AM  Result Value Ref Range   Hemoglobin 9.0 (L) 13.0 - 17.0 g/dL    Comment: Performed at [facility], [place] [address], [place], [place] [address]  Potassium     Status: None   Collection Time: 09/05/21 12:16 AM  Result Value Ref Range   Potassium 3.9 3.5 - 5.1 mmol/L    Comment: DELTA CHECK NOTED Performed at [facility], [place] [address], [place], [place] [address]   Creatinine, serum     Status: Abnormal   Collection Time: 09/05/21 12:16 AM  Result Value Ref Range   Creatinine, Ser 1.39 (H) 0.61 - 1.24 mg/dL   GFR, Estimated 56 (L) >60 mL/min    Comment: (NOTE) Calculated using the CKD-EPI Creatinine Equation (2021) Performed at [facility], [place] [address], [place], [place] [address]     Imaging / Studies: No results found.  Medications / Allergies: per chart  Antibiotics: Anti-infectives (From admission, onward)    Start     Dose/Rate Route Frequency Ordered Stop   09/03/21 2345  cefoTEtan (CEFOTAN) 2 g in sodium chloride 0.9 % 100 mL IVPB        2 g 200 mL/hr over 30 Minutes Intravenous Every 12 hours 09/03/21 1509 09/03/21 2316   09/03/21 0900  cefoTEtan (CEFOTAN) 2 g in sodium chloride 0.9 % 100 mL IVPB        2 g 200 mL/hr over 30 Minutes Intravenous On call to O.R. 09/03/21 0858 09/03/21 1200         Note: Portions of this report may have been transcribed using voice recognition software. Every effort was made to ensure accuracy; however, inadvertent computerized transcription errors may be present.   Any transcriptional errors that result from this process are unintentional.    Adin Hector, MD, FACS, MASCRS Esophageal, Gastrointestinal & Colorectal Surgery Robotic and Minimally Invasive Surgery  [facility], [place]  [place] [address], [place], [place] [address] [phone] Fax [phone] Main  CONTACT INFORMATION:  Weekday (9AM-5PM): Call [facility] main office at [phone]  Weeknight (5PM-9AM) or Weekend/Holiday: Check [email or web address] (password " [email or web address]") for General Surgery [facility] coverage  (Please, do not use SecureChat as it is not reliable communication to operating surgeons for immediate patient care)      09/05/2021  8:15 AM

## 2021-09-05 NOTE — Evaluation (Signed)
Occupational Therapy Evaluation Patient Details Name: William Good MRN: [id number] DOB: 1955/10/28 Today's Date: 09/05/2021   History of Present Illness William Good is a 66 y.o. male who is status post robotic low anterior resection with coloanal anastomosis and diverting loop ileostomy 05/27/2021. He is now s/p OPEN TAKEDOWN OF LOOP ILEOSTOMY.   Clinical Impression   William Good good is a 46 year man s/p abdominal surgery. On evaluation he presents with pain and decreased activity tolerance. Overall he was min guard for ambulation with RW and min assist for LB ADLs due to abdominal pain. Expect that patient will progress well with nursing care as his operative pain decreases. No OT needs at this time.       Recommendations for follow up therapy are one component of a multi-disciplinary discharge planning process, led by the attending physician.  Recommendations may be updated based on patient status, additional functional criteria and insurance authorization.   Follow Up Recommendations  No OT follow up    Assistance Recommended at Discharge PRN  Patient can return home with the following Assistance with cooking/housework;Assist for transportation;Help with stairs or ramp for entrance    Functional Status Assessment  Patient has had a recent decline in their functional status and demonstrates the ability to make significant improvements in function in a reasonable and predictable amount of time.  Equipment Recommendations  None recommended by OT    Recommendations for Other Services       Precautions / Restrictions Precautions Precautions: Fall Precaution Comments: monitor BP - hx of orthostatics Restrictions Weight Bearing Restrictions: No      Mobility Bed Mobility Overal bed mobility: Needs Assistance Bed Mobility: Sit to Supine       Sit to supine: Mod assist   General bed mobility comments: For lower extremities - secondary to pain.     Transfers Overall transfer level: Needs assistance Equipment used: Rolling walker (2 wheels)               General transfer comment: Min guard with RW to ambulate in hallway. No overt loss of balance.      Balance Overall balance assessment: Mild deficits observed, not formally tested                                         ADL either performed or assessed with clinical judgement   ADL Overall ADL's : Needs assistance/impaired Eating/Feeding: Independent   Grooming: Min guard;Standing   Upper Body Bathing: Set up;Sitting   Lower Body Bathing: Sit to/from stand;Minimal assistance   Upper Body Dressing : Set up;Sitting   Lower Body Dressing: Minimal assistance;Sit to/from stand   Toilet Transfer: Financial planner (2 wheels)   Toileting- Water quality scientist and Hygiene: Min guard;Sit to/from stand       Functional mobility during ADLs: Min guard;Rolling walker (2 wheels) General ADL Comments: Min guard to ambulate with rolling walker     Vision Baseline Vision/History: 1 Wears glasses Patient Visual Report: No change from baseline       Perception     Praxis      Pertinent Vitals/Pain Pain Assessment Pain Assessment: 0-10 Pain Score: 7  Pain Location: abdomen Pain Descriptors / Indicators: Grimacing, Guarding Pain Intervention(s): Monitored during session, Patient requesting pain meds-RN notified     Hand Dominance     Extremity/Trunk Assessment Upper Extremity Assessment Upper Extremity  Assessment: Overall WFL for tasks assessed   Lower Extremity Assessment Lower Extremity Assessment: Defer to PT evaluation   Cervical / Trunk Assessment Cervical / Trunk Assessment: Normal   Communication Communication Communication: No difficulties   Cognition Arousal/Alertness: Awake/alert Behavior During Therapy: WFL for tasks assessed/performed Overall Cognitive Status: Within Functional Limits for tasks  assessed                                       General Comments       Exercises     Shoulder Instructions      Home Living Family/patient expects to be discharged to:: Private residence Living Arrangements: Alone Available Help at Discharge: Family;Available PRN/intermittently (brother) Type of Home: Mobile home Home Access: Ramped entrance     Home Layout: One level     Bathroom Shower/Tub: Teacher, early years/pre: Standard     Home Equipment: Conservation officer, nature (2 wheels)   Additional Comments: Reports he returned to independence after he left SNF. Reports he has assistance of his brother.      Prior Functioning/Environment Prior Level of Function : Independent/Modified Independent                        OT Problem List: Pain;Decreased activity tolerance      OT Treatment/Interventions:      OT Goals(Current goals can be found in the care plan section) Acute Rehab OT Goals Patient Stated Goal: to go home OT Goal Formulation: All assessment and education complete, DC therapy  OT Frequency:      Co-evaluation              AM-PAC OT "6 Clicks" Daily Activity     Outcome Measure Help from another person eating meals?: None Help from another person taking care of personal grooming?: A Little Help from another person toileting, which includes using toliet, bedpan, or urinal?: A Little Help from another person bathing (including washing, rinsing, drying)?: A Little Help from another person to put on and taking off regular upper body clothing?: A Little Help from another person to put on and taking off regular lower body clothing?: A Little 6 Click Score: 19   End of Session Equipment Utilized During Treatment: Rolling walker (2 wheels) Nurse Communication: Mobility status  Activity Tolerance: Patient limited by pain Patient left: in bed;with call bell/phone within reach;with nursing/sitter in room  OT Visit Diagnosis:  Pain                Time: 2878-6767 OT Time Calculation (min): 17 min Charges:  OT General Charges $OT Visit: 1 Visit OT Evaluation $OT Eval Low Complexity: 1 Low  Yudith Norlander, OTR/L [place]  Office [phone] Pager: [place] 09/05/2021, 1:25 PM

## 2021-09-05 NOTE — Evaluation (Signed)
Physical Therapy Evaluation Patient Details Name: William Good MRN: [id number] DOB: [birth year] Today's Date: 09/05/2021  History of Present Illness  William Good is a 66 y.o. male who is status post robotic low anterior resection with coloanal anastomosis and diverting loop ileostomy 05/27/2021. He is now s/p OPEN TAKEDOWN OF LOOP ILEOSTOMY.  Clinical Impression  Pt admitted as above and presenting with functional mobility limitations 2* post op pain and mild ambulatory balance deficits.  Pt hopes to progress to dc home with intermittent assist of family.     Recommendations for follow up therapy are one component of a multi-disciplinary discharge planning process, led by the attending physician.  Recommendations may be updated based on patient status, additional functional criteria and insurance authorization.  Follow Up Recommendations Home health PT (dependent on acute stay progress)      Assistance Recommended at Discharge PRN  Patient can return home with the following  A little help with walking and/or transfers;A little help with bathing/dressing/bathroom;Assistance with cooking/housework;Assist for transportation;Help with stairs or ramp for entrance    Equipment Recommendations None recommended by PT  Recommendations for Other Services       Functional Status Assessment Patient has had a recent decline in their functional status and demonstrates the ability to make significant improvements in function in a reasonable and predictable amount of time.     Precautions / Restrictions Precautions Precautions: Fall Precaution Comments: monitor BP - hx of orthostatics Restrictions Weight Bearing Restrictions: No      Mobility  Bed Mobility Overal bed mobility: Needs Assistance Bed Mobility: Sit to Supine       Sit to supine: Mod assist   General bed mobility comments: For lower extremities - secondary to pain.    Transfers Overall transfer level: Needs  assistance Equipment used: Rolling walker (2 wheels) Transfers: Sit to/from Stand Sit to Stand: Min assist, Min guard           General transfer comment: Steady assist with cues for use of UEs to self assist    Ambulation/Gait Ambulation/Gait assistance: Min assist, Min guard Gait Distance (Feet): 120 Feet Assistive device: Rolling walker (2 wheels) Gait Pattern/deviations: Step-to pattern, Step-through pattern, Decreased step length - right, Decreased step length - left, Shuffle, Trunk flexed       General Gait Details: cues for posture and position from RW; distance ltd by pain/fatigue  Stairs            Wheelchair Mobility    Modified Rankin (Stroke Patients Only)       Balance Overall balance assessment: Mild deficits observed, not formally tested                                           Pertinent Vitals/Pain Pain Assessment Pain Assessment: 0-10 Pain Score: 7  Pain Location: abdomen Pain Descriptors / Indicators: Grimacing, Guarding Pain Intervention(s): Limited activity within patient's tolerance, Monitored during session, Patient requesting pain meds-RN notified    Home Living Family/patient expects to be discharged to:: Private residence Living Arrangements: Alone Available Help at Discharge: Family;Available PRN/intermittently Type of Home: Mobile home Home Access: Ramped entrance       Home Layout: One level Home Equipment: [place] (2 wheels) Additional Comments: Reports he returned to independence after he left SNF. Reports he has assistance of his brother.    Prior Function Prior Level of Function :  Independent/Modified Independent             Mobility Comments: Pt admits to falls       Hand Dominance        Extremity/Trunk Assessment   Upper Extremity Assessment Upper Extremity Assessment: Overall WFL for tasks assessed    Lower Extremity Assessment Lower Extremity Assessment: Overall WFL for  tasks assessed    Cervical / Trunk Assessment Cervical / Trunk Assessment: Normal  Communication   Communication: No difficulties  Cognition Arousal/Alertness: Awake/alert Behavior During Therapy: WFL for tasks assessed/performed Overall Cognitive Status: Within Functional Limits for tasks assessed                                          General Comments      Exercises     Assessment/Plan    PT Assessment Patient needs continued PT services  PT Problem List Decreased activity tolerance;Decreased balance;Decreased mobility;Decreased knowledge of use of DME;Pain       PT Treatment Interventions DME instruction;Gait training;Functional mobility training;Therapeutic activities;Therapeutic exercise;Balance training;Patient/family education    PT Goals (Current goals can be found in the Care Plan section)  Acute Rehab PT Goals Patient Stated Goal: Regain IND PT Goal Formulation: With patient Time For Goal Achievement: 09/19/21 Potential to Achieve Goals: Good    Frequency Min 3X/week     Co-evaluation PT/OT/SLP Co-Evaluation/Treatment: Yes Reason for Co-Treatment: For patient/therapist safety PT goals addressed during session: Mobility/safety with mobility OT goals addressed during session: ADL's and self-care       AM-PAC PT "6 Clicks" Mobility  Outcome Measure Help needed turning from your back to your side while in a flat bed without using bedrails?: A Little Help needed moving from lying on your back to sitting on the side of a flat bed without using bedrails?: A Lot Help needed moving to and from a bed to a chair (including a wheelchair)?: A Lot Help needed standing up from a chair using your arms (e.g., wheelchair or bedside chair)?: A Little Help needed to walk in hospital room?: A Little Help needed climbing 3-5 steps with a railing? : A Lot 6 Click Score: 15    End of Session Equipment Utilized During Treatment: Gait belt Activity  Tolerance: Patient tolerated treatment well;Patient limited by fatigue Patient left: in bed;with call bell/phone within reach;with bed alarm set Nurse Communication: Mobility status PT Visit Diagnosis: Unsteadiness on feet (R26.81);Difficulty in walking, not elsewhere classified (R26.2)    Time: 0093-8182 PT Time Calculation (min) (ACUTE ONLY): 17 min   Charges:   PT Evaluation $PT Eval Low Complexity: 1 Low          [facility] Pager [phone] Office [phone]   Pasqualina Colasurdo 09/05/2021, 4:52 PM

## 2021-09-05 NOTE — Evaluation (Signed)
Clinical/Bedside Swallow Evaluation Patient Details  Name: William Good MRN: [id number] Date of Birth: 09-01-1955  Today's Date: 09/05/2021 Time: SLP Start Time (ACUTE ONLY): 1215 SLP Stop Time (ACUTE ONLY): 1225 SLP Time Calculation (min) (ACUTE ONLY): 10 min  Past Medical History:  Past Medical History:  Diagnosis Date   Anxiety    Back pain    Bipolar disorder ([place])    bipolar 1   CKD (chronic kidney disease) stage 2, GFR 60-89 ml/min 09/24/2020   GERD (gastroesophageal reflux disease)    High output ileostomy ([place]) 05/29/2021   Hypertension    Knee pain    Migraine    Rectal cancer ([place])    Type 2 diabetes mellitus with complication, without long-term current use of insulin ([place]) 03/19/2021   no meds   Past Surgical History:  Past Surgical History:  Procedure Laterality Date   BACK SURGERY     ILEO LOOP DIVERSION N/A 09/03/2021   Procedure: OPEN TAKEDOWN OF LOOP ILEOSTOMY;  Surgeon: Michael Boston, MD;  Location: [facility];  Service: General;  Laterality: N/A;   JOINT REPLACEMENT Right    knee   OSTOMY N/A 05/27/2021   Procedure: POSSIBLE OSTOMY;  Surgeon: Michael Boston, MD;  Location: [facility];  Service: General;  Laterality: N/A;   PORT-A-CATH REMOVAL Right 09/03/2021   Procedure: REMOVAL PORT-A-CATH;  Surgeon: Michael Boston, MD;  Location: [facility];  Service: General;  Laterality: Right;   PORTACATH PLACEMENT N/A 10/02/2020   Procedure: PORT-A-CATH PLACEMENT;  Surgeon: Dwan Bolt, MD;  Location: [facility];  Service: General;  Laterality: N/A;   PROCTOSCOPY N/A 05/27/2021   Procedure: RIGID PROCTOSCOPY;  Surgeon: Michael Boston, MD;  Location: [facility];  Service: General;  Laterality: N/A;   XI ROBOTIC ASSISTED LOWER ANTERIOR RESECTION N/A 05/27/2021   Procedure: ROBOTIC LOW ANTERIOR COLON RESECTION, VERY LOW COLOANAL ANASTOMOSIS, DIVERTING ILEOSTOMY, BILATERAL TAP BLOCK,  FIRFLY INJECTION;  Surgeon: Michael Boston, MD;  Location: [facility];  Service: General;  Laterality: N/A;    HPI:  Patient is a 66 y.o. male with PMH: GERD, anxiety, HTN, cancer, SOB, HLD, CKD, bipolar disorder, severe recurrent major depression with psychotic features, DM-2. He underwent surgical open takedown of loop ileostomy on 09/03/21. His surgeon, Dr. Johney Maine ordered SLP evaluation to make sure he does not have any major aspiration risk and made recommendations for gradual advancement of diet.    Assessment / Plan / Recommendation  Clinical Impression  Patient is not currently presenting with any clinical s/s of dysphagia as per this bedside/clinical swallow evaluation. Patient and his RN both deny him exhibiting any coughing, throat clearing with PO's and aside from having a poor appetite, his swallow function appears WFL. RN reported that patient contiues to c/o food being too soft even after it was upgraded from puree solids to Dys 3 solids. Although regular solids may help with patient's PO intake, he must remain on soft solids secondary to recent surgery. SLP is not recommending any further skilled treatment at this time. As he has a h/o GERD, reflux precautions should be followed. SLP Visit Diagnosis: Dysphagia, unspecified (R13.10)    Aspiration Risk  No limitations    Diet Recommendation Dysphagia 3 (Mech soft);Thin liquid   Liquid Administration via: Cup;Straw Medication Administration: Whole meds with liquid Supervision: Patient able to self feed Compensations: Small sips/bites;Slow rate Postural Changes: Seated upright at 90 degrees;Remain upright for at least 30 minutes after po intake    Other  Recommendations Oral Care Recommendations: Oral care  BID    Recommendations for follow up therapy are one component of a multi-disciplinary discharge planning process, led by the attending physician.  Recommendations may be updated based on patient status, additional functional criteria and insurance authorization.  Follow up Recommendations No SLP follow up      Assistance Recommended  at Discharge None  Functional Status Assessment Patient has had a recent decline in their functional status and demonstrates the ability to make significant improvements in function in a reasonable and predictable amount of time.  Frequency and Duration     N/A       Prognosis   N/A     Swallow Study   General Date of Onset: 09/05/21 HPI: Patient is a 66 y.o. male with PMH: GERD, anxiety, HTN, cancer, SOB, HLD, CKD, bipolar disorder, severe recurrent major depression with psychotic features, DM-2. He underwent surgical open takedown of loop ileostomy on 09/03/21. His surgeon, Dr. Johney Maine ordered SLP evaluation to make sure he does not have any major aspiration risk and made recommendations for gradual advancement of diet. Type of Study: Bedside Swallow Evaluation Previous Swallow Assessment: none found Diet Prior to this Study: Dysphagia 3 (soft);Thin liquids Temperature Spikes Noted: No Respiratory Status: Room air History of Recent Intubation: Yes Length of Intubations (days): 1 days (for surgery only) Behavior/Cognition: Alert;Cooperative;Lethargic/Drowsy Oral Cavity Assessment: Within Functional Limits Oral Care Completed by SLP: No Oral Cavity - Dentition: Edentulous Vision: Functional for self-feeding Self-Feeding Abilities: Able to feed self Patient Positioning: Upright in bed Baseline Vocal Quality: Low vocal intensity Volitional Swallow: Able to elicit    Oral/Motor/Sensory Function Overall Oral Motor/Sensory Function: Within functional limits   Ice Chips     Thin Liquid Thin Liquid: Within functional limits Presentation: Cup    Nectar Thick     Honey Thick     Puree Puree: Not tested   Solid     Solid: Not tested      Sonia Baller, MA, CCC-SLP Speech Therapy

## 2021-09-06 NOTE — Plan of Care (Signed)
  Problem: Activity: Goal: Ability to tolerate increased activity will improve Outcome: Progressing   Problem: Respiratory: Goal: Respiratory status will improve Outcome: Progressing   Problem: Pain Managment: Goal: General experience of comfort will improve Outcome: Progressing   Problem: Safety: Goal: Ability to remain free from injury will improve Outcome: Progressing

## 2021-09-06 NOTE — Progress Notes (Signed)
William Good [id number] 08-05-1955  CARE TEAM:  PCP: Aura Dials, MD  Outpatient Care Team: Patient Care Team: Aura Dials, MD as PCP - General (Family Medicine) Michael Boston, MD as Consulting Physician (General Surgery) Otis Brace, MD as Consulting Physician (Gastroenterology) Ladell Pier, MD as Consulting Physician (Oncology) Kyung Rudd, MD as Consulting Physician (Radiation Oncology) Royston Bake, RN as Registered Nurse (Oncology) Larey Dresser, MD as Consulting Physician (Cardiology) Lucas Mallow, MD as Consulting Physician (Urology)  Inpatient Treatment Team: Treatment Team: Attending Provider: Michael Boston, MD; Utilization Review: Alease Medina, RN; Registered Nurse: Dorinda Hill, RN; Technician: Wright, [place] E, NT; Registered Nurse: Marlis Edelson, RN   Problem List:   Principal Problem:   Rectal cancer [facility]) Active Problems:   Chronic kidney disease, stage 3 unspecified ([place])   Essential hypertension   Type 2 diabetes mellitus with complication, without long-term current use of insulin ([place])   Bipolar disorder ([place])   Gastroesophageal reflux disease   Restless legs   Hypertrophic cardiomyopathy ([place])   3 Days Post-Op  09/03/2021  POST-OPERATIVE DIAGNOSIS:   LOOP ILEOSTOMY  FOR FECAL DIVERSION S/P LAR  RECTAL CANCER   PROCEDURE:   OPEN TAKEDOWN OF LOOP ILEOSTOMY ANORECTAL EXAM UNDER ANESTHESIA REMOVAL PORT-A-CATH   SURGEON:  Adin Hector, MD  OR FINDINGS:    No stricture at rectal anastomosis -rests 4 cm from anal verge.  Anastomosis well healed.    Moderate adhesions of loop ileostomy to abdominal wall. No parastomal hernia.    It is a side-to-side stapled ileoileal anastomosis close to the terminal ileum.   Assessment  Gradually improving.  [facility] Stay = 3 days)  Plan:  -ERAS pathway.  Dysphagia 3 mechanically soft diet.  Evaluated by speech therapy who concur.    Multimodal pain  control.  Hypertension control.  History of diabetes but not quite a medical therapy.  No major hypoglycemia.  Follow.  CKD Kidney disease with mildly elevated creatinine but nonoliguric.  Follow off IV fluids and check creatinine to prove stability.  Follow urine output and creatinine closely.  Hopefully less of an issue now that his ileostomy has been taken down.  We will see.  Fiber bowel regimen.  Awaiting return of bowel function.  Having some flatus a hopeful sign  VTE prophylaxis- SCDs, etc  Mobilize as tolerated to help recovery felt by therapist to benefit from some home health care.  Wrote for that to hopefully get set up.  Patient seems to gradually improving.  Hopefully may be able to get out of here tomorrow.  We will see  Disposition:  Disposition:  The patient is from: Home  Anticipate discharge to:  Home with Home Health  Anticipated Date of Discharge is:  July 3,2023    Barriers to discharge:  Pending Clinical improvement (more likely than not)  Patient currently is NOT MEDICALLY STABLE for discharge from the hospital from a surgery standpoint.      I reviewed nursing notes, last 24 h vitals and pain scores, last 48 h intake and output, last 24 h labs and trends, and last 24 h imaging results. I have reviewed this patient's available data, including medical history, events of note, test results, etc as part of my evaluation.  A significant portion of that time was spent in counseling.  Care during the described time interval was provided by me.  This care required moderate level of medical decision making.  09/06/2021    Subjective: (  Chief complaint)  Sitting up in chair.  Trying some solid food.  Felt like he passed some gas and a little smear bowel movement no no major bowel doing.  Pain better controlled.  Seen by physical, occupational, speech therapy yesterday.  Objective:  Vital signs:  Vitals:   09/05/21 1340 09/05/21 2223 09/06/21 0500  09/06/21 0618  BP: 116/74 115/76  120/78  Pulse: 71 73  74  Resp: '14 18  16  '$ Temp: 98.4 F (36.9 C) 98.6 F (37 C)  97.9 F (36.6 C)  TempSrc: Oral Oral  Oral  SpO2: 97% 95%  94%  Weight:   94 kg   Height:        Last BM Date : 09/02/21  Intake/Output   Yesterday:  07/01 0701 - 07/02 0700 In: 840 [P.O.:840] Out: 950 [Urine:950] This shift:  No intake/output data recorded.  Bowel function:  Flatus: No  BM:  No  Drain: (No drain)   Physical Exam:  General: Pt awake/alert in no acute distress.  Sitting up in chair.  Alert.  Not groggy.  This is the best I have seen him ever.  . Eyes: PERRL, normal EOM.  Sclera clear.  No icterus Neuro: CN II-XII intact w/o focal sensory/motor deficits. Lymph: No head/neck/groin lymphadenopathy Psych:  No delerium/psychosis/paranoia.  Oriented x 4 HENT: Normocephalic, Mucus membranes moist.  No thrush Neck: Supple, No tracheal deviation.  No obvious thyromegaly Chest: No pain to chest wall compression.  Good respiratory excursion.  No audible wheezing CV:  Pulses intact.  Regular rhythm.  No major extremity edema MS: Normal AROM mjr joints.  No obvious deformity  Abdomen: Soft.  Nondistended.  Nontender.  Incisions clean dry and intact.  No evidence of peritonitis.  No incarcerated hernias.  Ext:   No deformity.  No mjr edema.  No cyanosis Skin: No petechiae / purpurea.  No major sores.  Warm and dry    Results:   Cultures: No results found for this or any previous visit (from the past 720 hour(s)).  Labs: Results for orders placed or performed during the hospital encounter of 09/03/21 (from the past 48 hour(s))  Hemoglobin     Status: Abnormal   Collection Time: 09/05/21 12:16 AM  Result Value Ref Range   Hemoglobin 9.0 (L) 13.0 - 17.0 g/dL    Comment: Performed at [facility], [place] [address] [place], [place] [address]  Potassium     Status: None   Collection Time: 09/05/21 12:16 AM  Result Value Ref  Range   Potassium 3.9 3.5 - 5.1 mmol/L    Comment: DELTA CHECK NOTED Performed at [facility], [place] [address] [place], [place] [address]   Creatinine, serum     Status: Abnormal   Collection Time: 09/05/21 12:16 AM  Result Value Ref Range   Creatinine, Ser 1.39 (H) 0.61 - 1.24 mg/dL   GFR, Estimated 56 (L) >60 mL/min    Comment: (NOTE) Calculated using the CKD-EPI Creatinine Equation (2021) Performed at [facility], [place] [address] [place], [place] [address]     Imaging / Studies: No results found.  Medications / Allergies: per chart  Antibiotics: Anti-infectives (From admission, onward)    Start     Dose/Rate Route Frequency Ordered Stop   09/03/21 2345  cefoTEtan (CEFOTAN) 2 g in sodium chloride 0.9 % 100 mL IVPB        2 g 200 mL/hr over 30 Minutes Intravenous Every 12 hours 09/03/21  1509 09/03/21 2316   09/03/21 0900  cefoTEtan (CEFOTAN) 2 g in sodium chloride 0.9 % 100 mL IVPB        2 g 200 mL/hr over 30 Minutes Intravenous On call to O.R. 09/03/21 0858 09/03/21 1200         Note: Portions of this report may have been transcribed using voice recognition software. Every effort was made to ensure accuracy; however, inadvertent computerized transcription errors may be present.   Any transcriptional errors that result from this process are unintentional.    Adin Hector, MD, FACS, MASCRS Esophageal, Gastrointestinal & Colorectal Surgery Robotic and Minimally Invasive Surgery  [facility], [place]  [place]. [address], [place], [place] [address] [phone] Fax [phone] Main  CONTACT INFORMATION:  Weekday (9AM-5PM): Call [facility] main office at [phone]  Weeknight (5PM-9AM) or Weekend/Holiday: Check [email or web address] (password " [email or web address]") for General Surgery [facility] coverage  (Please, do not use SecureChat as it is not reliable communication to operating  surgeons for immediate patient care)      09/06/2021  8:53 AM

## 2021-09-07 LAB — POTASSIUM: Potassium: 3.5 mmol/L (ref 3.5–5.1)

## 2021-09-07 LAB — CREATININE, SERUM
Creatinine, Ser: 1.3 mg/dL — ABNORMAL HIGH (ref 0.61–1.24)
GFR, Estimated: >60 mL/min (ref >60)

## 2021-09-07 LAB — HEMOGLOBIN: Hemoglobin: 9.5 g/dL — ABNORMAL LOW (ref 13.0–17.0)

## 2021-09-07 NOTE — Progress Notes (Signed)
Discharge instructions given to patient and all questions were answered.  

## 2021-09-07 NOTE — TOC Transition Note (Signed)
Transition of Care [facility]) - CM/SW Discharge Note   Patient Details  Name: JAKAI RISSE MRN: [id number] Date of Birth: [birth year]  [facility] of [facility]) CM/SW Contact:  Lennart Pall, LCSW Phone Number: 09/07/2021, 10:32 AM   Clinical Narrative:    Alerted that pt medically ready to dc today and orders placed for HHPT.  Spoke with pt who is agreeable and no agency preference.  Referral placed with [facility] and information on AVS.  No further TOC needs.   Final next level of care: Home w Home Health Services Barriers to Discharge: No Barriers Identified   Patient Goals and CMS Choice        Discharge Placement                       Discharge Plan and Services                DME Arranged: N/A DME Agency: NA       HH Arranged: PT HH Agency: [place] Date [facility] Agency Contacted: 09/07/21 Time [place]: 2330 Representative spoke with at [place]: [facility]) Interventions     Readmission Risk Interventions    05/29/2021    2:21 PM  Readmission Risk Prevention Plan  Transportation Screening Complete  PCP or Specialist Appt within 3-5 Days Complete  HRI or [employer] Complete  Social Work Consult for [place] Planning/Counseling Complete  Palliative Care Screening Not Applicable

## 2021-09-07 NOTE — Discharge Summary (Signed)
Physician Discharge Summary    Patient ID: William Good MRN: [id number] DOB/AGE: 1955/05/12  66 y.o.  Patient Care Team: Aura Dials, MD as PCP - General (Family Medicine) Michael Boston, MD as Consulting Physician (General Surgery) Otis Brace, MD as Consulting Physician (Gastroenterology) Ladell Pier, MD as Consulting Physician (Oncology) Kyung Rudd, MD as Consulting Physician (Radiation Oncology) Royston Bake, RN as Registered Nurse (Oncology) Larey Dresser, MD as Consulting Physician (Cardiology) Lucas Mallow, MD as Consulting Physician (Urology)  Admit date: 09/03/2021  Discharge date: 09/07/2021  Hospital Stay = 4 days    Discharge Diagnoses:  Principal Problem:   Rectal cancer [facility]) Active Problems:   Chronic kidney disease, stage 3 unspecified ([place])   Essential hypertension   Type 2 diabetes mellitus with complication, without long-term current use of insulin (HCC)   Bipolar disorder ([place])   Gastroesophageal reflux disease   Restless legs   Hypertrophic cardiomyopathy ([place])   4 Days Post-Op  09/03/2021  POST-OPERATIVE DIAGNOSIS:   LOOP ILEOSTOMY  FOR FECAL DIVERSION S/P LAR  RECTAL CANCER   PROCEDURE:   OPEN TAKEDOWN OF LOOP ILEOSTOMY ANORECTAL EXAM UNDER ANESTHESIA REMOVAL PORT-A-CATH   SURGEON:  Adin Hector, MD   OR FINDINGS:    No stricture at rectal anastomosis -rests 4 cm from anal verge.  Anastomosis well healed.    Moderate adhesions of loop ileostomy to abdominal wall. No parastomal hernia.    It is a side-to-side stapled ileoileal anastomosis close to the terminal ileum.   Consults: Physical Therapy, Occupational Therapy, Speech Therapy, and Case Management / Social Work  Hospital Course:   Patient with rectal cancer status post chemotherapy, neoadjuvant chemoradiation therapy, robotic low anterior resection earlier this year.  Diverting loop ileostomy.  Recovered to the point that safe for ileostomy  takedown.  Preoperative examination and enema negative for obstruction.  The patient underwent the surgery above.  Postoperatively, the patient gradually mobilized and advanced to a solid diet.  Pain and other symptoms were treated aggressively.    By the time of discharge, the patient was walking well the hallways, eating food, having flatus.  I asked speech therapy to help evaluate and they concurred given his dentition that mechanically soft diet dysphagia 3 seemed appropriate.  Patient seemed to tolerate that well with no major aspiration risks.  Worked with physical and Occupational Therapy.  Felt to benefit from home health.  Brother again closely involved.  Pain was well-controlled on an oral medications.  Based on meeting discharge criteria and continuing to recover, I felt it was safe for the patient to be discharged from the hospital to further recover with close followup. Postoperative recommendations were discussed in detail.  They are written as well.  Discharged Condition: good  Discharge Exam: Blood pressure 118/82, pulse 67, temperature 97.7 F (36.5 C), temperature source Oral, resp. rate 18, height 6' (1.829 m), weight 92.9 kg, SpO2 93 %.  General: Pt awake/alert/oriented x4 in No acute distress Eyes: PERRL, normal EOM.  Sclera clear.  No icterus Neuro: CN II-XII intact w/o focal sensory/motor deficits. Lymph: No head/neck/groin lymphadenopathy Psych:  No delerium/psychosis/paranoia HENT: Normocephalic, Mucus membranes moist.  No thrush Neck: Supple, No tracheal deviation Chest:  No chest wall pain w good excursion CV:  Pulses intact.  Regular rhythm MS: Normal AROM mjr joints.  No obvious deformity Abdomen: Soft.  Nondistended.  Nontender.  Old ileostomy incision closed with normal healing ridge.  No cellulitis hematoma nor abscess.  No evidence  of peritonitis.  No incarcerated hernias. Ext:  SCDs BLE.  No mjr edema.  No cyanosis Skin: No petechiae / purpura   Disposition:     Follow-up Information     Michael Boston, MD Follow up in 3 week(s).   Specialties: General Surgery, Colon and Rectal Surgery Why: To follow up after your operation, To follow up after your hospital stay Contact information: [place] [address] [phone]                 Discharge disposition: 01-Home or Self Care       Discharge Instructions     Call MD for:   Complete by: As directed    FEVER > 101.5 F (Temperatures <101.38F can occasionally happen and are not significant)   Call MD for:  extreme fatigue   Complete by: As directed    Call MD for:  persistant dizziness or light-headedness   Complete by: As directed    Call MD for:  persistant nausea and vomiting   Complete by: As directed    Call MD for:  redness, tenderness, or signs of infection (pain, swelling, redness, odor or green/yellow discharge around incision site)   Complete by: As directed    Call MD for:  severe uncontrolled pain   Complete by: As directed    Diet - low sodium heart healthy   Complete by: As directed    Follow a light diet the first few days at home.    If you feel full, bloated, or constipated, stay on a liquid diet until you feel better and not constipated. Gradually get back to a solid diet.  Avoid fast food or heavy meals the first week as you are more likely to get nauseated. It is expected for your digestive tract to need a few months to get back to normal.   Discharge wound care:   Complete by: As directed    You have an open wound. If you have an open wound with a wound vac, see wound vac care instructions. If the wound is being packed, see wound care instructions.    In general, it is encouraged that you remove your dressing and packing, shower with soap & water, and replace your dressing once a day.   Pack the wound with clean gauze moistened with normal (0.9%) saline or KY gel to keep the wound moist & uninfected.    .   Eventually  your body will heal & pull the open wound closed over the next few months.  Raw open wounds will occasionally bleed or secrete yellow drainage until it heals closed.   Pressure on the dressing for 30 minutes will stop most wound bleeding Drain sites will drain a little until the drain is removed.   Driving Restrictions   Complete by: As directed    You may drive when you are no longer taking narcotic prescription pain medication, you can comfortably wear a seatbelt, and you can safely make sudden turns/stops to protect yourself without hesitating due to pain.   Increase activity slowly   Complete by: As directed    Lifting restrictions   Complete by: As directed    Start light daily activities --- self-care, walking, climbing stairs- beginning the day after surgery.   Gradually increase activities as tolerated.   Control your pain to be active.   Stop when you are tired.   Ideally, walk several times a day, eventually an hour a day.  Most people are back to most day-to-day activities in a few weeks.  It takes 4-8 weeks to get back to unrestricted, intense activity. If you can walk 30 minutes without difficulty, it is safe to try more intense activity such as jogging, treadmill, bicycling, low-impact aerobics, swimming, etc. Save the most intensive and strenuous activity for last (Usually 4-8 weeks after surgery) such as sit-ups, heavy lifting, contact sports, etc.   Refrain from any intense heavy lifting or straining until you are off narcotics for pain control.  You will have off days, but things should improve week-by-week. DO NOT PUSH THROUGH PAIN.   Let pain be your guide: If it hurts to do something, don't do it.  Pain is your body warning you to avoid that activity for another week until the pain goes down.   May shower / Bathe   Complete by: As directed    May walk up steps   Complete by: As directed    Sexual Activity Restrictions   Complete by: As directed    You may have sexual  intercourse when it is comfortable. If it hurts to do something, stop.       Allergies as of 09/07/2021       Reactions   Nsaids Other (See Comments)   Chronic kidney disease - avoid NSAIDs        Medication List     STOP taking these medications    Stomahesive Protective Powd       TAKE these medications    acetaminophen 325 MG tablet Commonly known as: TYLENOL Take 650 mg by mouth every 6 (six) hours as needed for mild pain.   albuterol 108 (90 Base) MCG/ACT inhaler Commonly known as: VENTOLIN HFA Inhale 2 puffs into the lungs every 6 (six) hours as needed for wheezing or shortness of breath.   ALPRAZolam 1 MG tablet Commonly known as: XANAX Take 1 tablet (1 mg total) by mouth 2 (two) times daily. Every morning as needed for anxiety, nerves, and behavior   aspirin EC 81 MG tablet Take 81 mg by mouth daily. Swallow whole.   cholestyramine 4 g packet Commonly known as: QUESTRAN Take 1 packet (4 g total) by mouth 2 (two) times daily. to bulk stool for high output ileostomy   dronabinol 2.5 MG capsule Commonly known as: MARINOL Take 2.5 mg by mouth daily.   ENSURE ENLIVE PO Take by mouth. 120 ml with medpass to protect muscle mass and help meet increased proten/energy needs Twice A Day Between Meals   feeding supplement (PRO-STAT SUGAR FREE 64) Liqd Take 30 mLs by mouth 3 (three) times daily with meals. Special Instructions: for albumin 2.8   HEPARIN LOCK FLUSH IJ syringe; 10 unit/mL; amt: 10 units/mL; intravenous; Twice A Day Special Instructions: Flush port line after each use.   HUBER NEEDLE 22GX3/4" 22G X 3/4" Misc 22 x 3/4 "; miscellaneous,Once A Day on Mon Special Instructions: change weekly   lidocaine-prilocaine cream Commonly known as: EMLA Apply 1 application topically as directed. Apply 1/2 tablespoon to port-a-cath site 2 hours prior to stick and cover with plastic wrap to numb site   loperamide 2 MG capsule Commonly known as: IMODIUM Take  1 capsule (2 mg total) by mouth 2 (two) times daily.   magnesium oxide 400 (240 Mg) MG tablet Commonly known as: MAG-OX Take 1 tablet (400 mg total) by mouth 2 (two) times daily.   melatonin 3 MG Tabs tablet Take 3 mg by mouth at bedtime.  mirtazapine 45 MG tablet Commonly known as: REMERON Take 1 tablet (45 mg total) by mouth at bedtime.   NON FORMULARY Diet:Regular Diet with Thin Liquids   omeprazole 20 MG capsule Commonly known as: PRILOSEC Take 1 capsule (20 mg total) 2 (two) times daily by mouth.   ondansetron 8 MG disintegrating tablet Commonly known as: ZOFRAN-ODT Take 1 tablet (8 mg total) by mouth every 8 (eight) hours as needed for nausea or vomiting.   polycarbophil 625 MG tablet Commonly known as: FIBERCON Take 1 tablet (625 mg total) by mouth 2 (two) times daily.   potassium chloride SA 20 MEQ tablet Commonly known as: KLOR-CON M Take 1 tablet (20 mEq total) by mouth 2 (two) times daily.   pravastatin 40 MG tablet Commonly known as: PRAVACHOL Take 1 tablet (40 mg total) by mouth daily.   propranolol 80 MG 24 hr capsule Commonly known as: INNOPRAN XL Take 2 capsules (160 mg total) by mouth daily.   risperiDONE 0.25 MG tablet Commonly known as: RISPERDAL Take 0.5 tablets (0.125 mg total) by mouth at bedtime. What changed: how much to take   sodium chloride 0.9 % Soln Flush port-a-cath with N/S 52m BID & then follow with the heparin flush. (See order for heparin flush) Twice A Day   SUMAtriptan 100 MG tablet Commonly known as: IMITREX Take 1 tablet (100 mg total) by mouth 2 (two) times daily as needed for migraine.   tamsulosin 0.4 MG Caps capsule Commonly known as: FLOMAX Take 1 capsule (0.4 mg total) by mouth at bedtime.   traMADol 50 MG tablet Commonly known as: ULTRAM Take 1-2 tablets (50-100 mg total) by mouth every 6 (six) hours as needed for moderate pain or severe pain.   Vitamin D3 50 MCG (2000 UT) capsule Take 2,000 Units by mouth  daily.               Discharge Care Instructions  (From admission, onward)           Start     Ordered   09/03/21 0000  Discharge wound care:       Comments: You have an open wound. If you have an open wound with a wound vac, see wound vac care instructions. If the wound is being packed, see wound care instructions.    In general, it is encouraged that you remove your dressing and packing, shower with soap & water, and replace your dressing once a day.   Pack the wound with clean gauze moistened with normal (0.9%) saline or KY gel to keep the wound moist & uninfected.    .   Eventually your body will heal & pull the open wound closed over the next few months.  Raw open wounds will occasionally bleed or secrete yellow drainage until it heals closed.   Pressure on the dressing for 30 minutes will stop most wound bleeding Drain sites will drain a little until the drain is removed.   09/03/21 1149            Significant Diagnostic Studies:  Results for orders placed or performed during the hospital encounter of 09/03/21 (from the past 72 hour(s))  Hemoglobin     Status: Abnormal   Collection Time: 09/05/21 12:16 AM  Result Value Ref Range   Hemoglobin 9.0 (L) 13.0 - 17.0 g/dL    Comment: Performed at [facility] [id number], [place] [address] Potassium     Status: None   Collection  Time: 09/05/21 12:16 AM  Result Value Ref Range   Potassium 3.9 3.5 - 5.1 mmol/L    Comment: DELTA CHECK NOTED Performed at [place] [address] [place], [place] [address]   Creatinine, serum     Status: Abnormal   Collection Time: 09/05/21 12:16 AM  Result Value Ref Range   Creatinine, Ser 1.39 (H) 0.61 - 1.24 mg/dL   GFR, Estimated 56 (L) >60 mL/min    Comment: (NOTE) Calculated using the CKD-EPI Creatinine Equation (2021) Performed at [facility], [place] [address], [place], [place] [address]    Hemoglobin     Status: Abnormal   Collection Time: 09/07/21 12:21 AM  Result Value Ref Range   Hemoglobin 9.5 (L) 13.0 - 17.0 g/dL    Comment: Performed at [facility], [place] [address], [place], [place] [address]  Creatinine, serum     Status: Abnormal   Collection Time: 09/07/21 12:21 AM  Result Value Ref Range   Creatinine, Ser 1.30 (H) 0.61 - 1.24 mg/dL   GFR, Estimated >60 >60 mL/min    Comment: (NOTE) Calculated using the CKD-EPI Creatinine Equation (2021) Performed at [facility], [place] [address], [place], [place] [address]   Potassium     Status: None   Collection Time: 09/07/21 12:21 AM  Result Value Ref Range   Potassium 3.5 3.5 - 5.1 mmol/L    Comment: Performed at [facility], [place] [address], [place], [place] [address]    No results found.  Past Medical History:  Diagnosis Date   Anxiety    Back pain    Bipolar disorder ([place])    bipolar 1   CKD (chronic kidney disease) stage 2, GFR 60-89 ml/min 09/24/2020   GERD (gastroesophageal reflux disease)    High output ileostomy ([place]) 05/29/2021   Hypertension    Knee pain    Migraine    Rectal cancer ([place])    Type 2 diabetes mellitus with complication, without long-term current use of insulin ([place]) 03/19/2021   no meds    Past Surgical History:  Procedure Laterality Date   BACK SURGERY     ILEO LOOP DIVERSION N/A 09/03/2021   Procedure: OPEN TAKEDOWN OF LOOP ILEOSTOMY;  Surgeon: Michael Boston, MD;  Location: [facility];  Service: General;  Laterality: N/A;   JOINT REPLACEMENT Right    knee   OSTOMY N/A 05/27/2021   Procedure: POSSIBLE OSTOMY;  Surgeon: Michael Boston, MD;  Location: [facility];  Service: General;  Laterality: N/A;   PORT-A-CATH REMOVAL Right 09/03/2021   Procedure: REMOVAL PORT-A-CATH;  Surgeon: Michael Boston, MD;  Location: [facility];  Service: General;  Laterality: Right;   PORTACATH PLACEMENT N/A 10/02/2020   Procedure: PORT-A-CATH PLACEMENT;   Surgeon: Dwan Bolt, MD;  Location: [facility];  Service: General;  Laterality: N/A;   PROCTOSCOPY N/A 05/27/2021   Procedure: RIGID PROCTOSCOPY;  Surgeon: Michael Boston, MD;  Location: [facility];  Service: General;  Laterality: N/A;   XI ROBOTIC ASSISTED LOWER ANTERIOR RESECTION N/A 05/27/2021   Procedure: ROBOTIC LOW ANTERIOR COLON RESECTION, VERY LOW COLOANAL ANASTOMOSIS, DIVERTING ILEOSTOMY, BILATERAL TAP BLOCK,  FIRFLY INJECTION;  Surgeon: Michael Boston, MD;  Location: [facility];  Service: General;  Laterality: N/A;    Social History   Socioeconomic History   Marital status: Divorced    Spouse name: Not on file   Number of children: Not on file   Years of education: Not on file  Highest education level: Not on file  Occupational History   Not on file  Tobacco Use   Smoking status: Former    Packs/day: 0.50    Years: 45.00    Total pack years: 22.50    Types: Cigarettes    Quit date: 04/22/2021    Years since quitting: 0.3   Smokeless tobacco: Never  Vaping Use   Vaping Use: Never used  Substance and Sexual Activity   Alcohol use: No   Drug use: No   Sexual activity: Not Currently  Other Topics Concern   Not on file  Social History Narrative   Not on file   Social Determinants of Health   Financial Resource Strain: Not on file  Food Insecurity: Not on file  Transportation Needs: Not on file  Physical Activity: Not on file  Stress: Not on file  Social Connections: Not on file  Intimate Partner Violence: Not on file    Family History  Problem Relation Age of Onset   ALS Mother    Heart disease Mother    Parkinson's disease Mother    Heart failure Father     Current Facility-Administered Medications  Medication Dose Route Frequency Provider Last Rate Last Admin   0.9 %  sodium chloride infusion  250 mL Intravenous PRN Michael Boston, MD   Stopped at 09/04/21 1230   acetaminophen (TYLENOL) tablet 1,000 mg  1,000 mg Oral Q6H Michael Boston, MD   1,000 mg at 09/07/21  0541   albuterol (PROVENTIL) (2.5 MG/3ML) 0.083% nebulizer solution 3 mL  3 mL Inhalation Q6H PRN Michael Boston, MD       ALPRAZolam Duanne Moron) tablet 1 mg  1 mg Oral BID Michael Boston, MD   1 mg at 09/06/21 2150   alum & mag hydroxide-simeth (MAALOX/MYLANTA) 200-200-20 MG/5ML suspension 30 mL  30 mL Oral Q6H PRN Michael Boston, MD       alvimopan (ENTEREG) capsule 12 mg  12 mg Oral BID Michael Boston, MD   12 mg at 09/06/21 0867   bismuth subsalicylate (PEPTO BISMOL) 262 MG/15ML suspension 30 mL  30 mL Oral Q8H PRN Michael Boston, MD       cholecalciferol (VITAMIN D3) tablet 2,000 Units  2,000 Units Oral Daily Michael Boston, MD   2,000 Units at 09/06/21 0945   diphenhydrAMINE (BENADRYL) 12.5 MG/5ML elixir 12.5 mg  12.5 mg Oral Q6H PRN Michael Boston, MD       Or   diphenhydrAMINE (BENADRYL) injection 12.5 mg  12.5 mg Intravenous Q6H PRN Michael Boston, MD       dronabinol (MARINOL) capsule 2.5 mg  2.5 mg Oral Daily Michael Boston, MD   2.5 mg at 09/06/21 0945   enalaprilat (VASOTEC) injection 0.625-1.25 mg  0.625-1.25 mg Intravenous Q6H PRN Michael Boston, MD       enoxaparin (LOVENOX) injection 40 mg  40 mg Subcutaneous Q24H Michael Boston, MD   40 mg at 09/06/21 0849   feeding supplement (ENSURE SURGERY) liquid 237 mL  237 mL Oral BID BM Michael Boston, MD   237 mL at 09/05/21 1426   hydrALAZINE (APRESOLINE) injection 10 mg  10 mg Intravenous Q2H PRN Michael Boston, MD       HYDROmorphone (DILAUDID) injection 0.5-2 mg  0.5-2 mg Intravenous Q4H PRN Michael Boston, MD   1 mg at 09/04/21 1929   melatonin tablet 3 mg  3 mg Oral Ardeen Fillers, MD   3 mg at 09/06/21 2149   melatonin tablet 3 mg  3 mg Oral QHS PRN Michael Boston, MD       methocarbamol (ROBAXIN) 1,000 mg in dextrose 5 % 100 mL IVPB  1,000 mg Intravenous Q6H PRN Michael Boston, MD       methocarbamol (ROBAXIN) tablet 1,000 mg  1,000 mg Oral Q6H PRN Michael Boston, MD       metoprolol tartrate (LOPRESSOR) injection 5 mg  5 mg Intravenous Q6H PRN  Michael Boston, MD       mirtazapine (REMERON) tablet 45 mg  45 mg Oral Ardeen Fillers, MD   45 mg at 09/06/21 2150   ondansetron (ZOFRAN) tablet 4 mg  4 mg Oral Q6H PRN Michael Boston, MD       Or   ondansetron [facility]) injection 4 mg  4 mg Intravenous Q6H PRN Michael Boston, MD       pantoprazole (PROTONIX) EC tablet 80 mg  80 mg Oral Daily Michael Boston, MD   80 mg at 09/06/21 0945   polycarbophil (FIBERCON) tablet 625 mg  625 mg Oral BID Michael Boston, MD   625 mg at 09/06/21 2150   pravastatin (PRAVACHOL) tablet 40 mg  40 mg Oral Daily Michael Boston, MD   40 mg at 09/06/21 0945   prochlorperazine (COMPAZINE) tablet 10 mg  10 mg Oral Q6H PRN Michael Boston, MD       Or   prochlorperazine (COMPAZINE) injection 5-10 mg  5-10 mg Intravenous Q6H PRN Michael Boston, MD       propranolol ER (INDERAL LA) 24 hr capsule 80 mg  80 mg Oral Daily Michael Boston, MD   80 mg at 09/06/21 0945   risperiDONE (RISPERDAL) tablet 0.25 mg  0.25 mg Oral Ardeen Fillers, MD   0.25 mg at 09/06/21 2149   simethicone (MYLICON) chewable tablet 40 mg  40 mg Oral Q6H PRN Michael Boston, MD       sodium chloride flush (NS) 0.9 % injection 3 mL  3 mL Intravenous Gorden Harms, MD   3 mL at 09/06/21 2153   sodium chloride flush (NS) 0.9 % injection 3 mL  3 mL Intravenous PRN Michael Boston, MD       SUMAtriptan (IMITREX) tablet 100 mg  100 mg Oral BID PRN Michael Boston, MD       tamsulosin [facility]) capsule 0.4 mg  0.4 mg Oral Ardeen Fillers, MD   0.4 mg at 09/06/21 2149   traMADol (ULTRAM) tablet 50-100 mg  50-100 mg Oral Q6H PRN Michael Boston, MD   100 mg at 09/06/21 2150     Allergies  Allergen Reactions   Nsaids Other (See Comments)    Chronic kidney disease - avoid NSAIDs    Signed:   Adin Hector, MD, FACS, MASCRS Esophageal, Gastrointestinal & Colorectal Surgery Robotic and Minimally Invasive Surgery  [facility], [place]  [place]  [place], [place] [address] [phone] Fax [phone] Main  CONTACT INFORMATION:  Weekday (9AM-5PM): Call [facility] main office at [phone]  Weeknight (5PM-9AM) or Weekend/Holiday: Check [email or web address] (password " [email or web address]") for General Surgery [facility] coverage  (Please, do not use SecureChat as it is not reliable communication to operating surgeons for immediate patient care)      09/07/2021, 8:05 AM

## 2021-09-09 ENCOUNTER — Telehealth: Payer: Self-pay | Admitting: *Deleted

## 2021-09-09 NOTE — Telephone Encounter (Signed)
Gurfateh Delong's brother, [name] was contacted by telephone to verify understanding of discharge instructions status post their most recent discharge from the hospital on the date:  09/07/21.  Inpatient discharge AVS was re-reviewed with brother, along with cancer center appointments.  Verification of understanding for oncology specific follow-up was validated using the Teach Back method.   Eating well, having some diarrhea but is taking Imodium which is helpful. Pain controlled. Cleaning surgical scar with soap and water. Brother will assess area daily as well as site from prior port (removed).  Transportation to appointments were confirmed for the patient as being self/caregiver. Family will transport  Mr. [place] questions were addressed to their satisfaction upon completion of this post discharge follow-up call for outpatient oncology.

## 2021-09-11 ENCOUNTER — Encounter (HOSPITAL_COMMUNITY): Payer: Self-pay | Admitting: Nurse Practitioner

## 2021-09-15 ENCOUNTER — Encounter: Payer: Self-pay | Admitting: Nurse Practitioner

## 2021-09-15 ENCOUNTER — Other Ambulatory Visit: Payer: Self-pay | Admitting: *Deleted

## 2021-09-15 ENCOUNTER — Inpatient Hospital Stay: Payer: Medicare Other

## 2021-09-15 ENCOUNTER — Inpatient Hospital Stay: Payer: Medicare Other | Admitting: Nurse Practitioner

## 2021-09-15 ENCOUNTER — Inpatient Hospital Stay: Payer: Medicare Other | Attending: Oncology

## 2021-09-15 VITALS — BP 145/90 | HR 72 | Temp 98.1°F | Resp 18 | Ht 72.0 in | Wt 200.6 lb

## 2021-09-15 DIAGNOSIS — E1122 Type 2 diabetes mellitus with diabetic chronic kidney disease: Secondary | ICD-10-CM | POA: Diagnosis not present

## 2021-09-15 DIAGNOSIS — G620 Drug-induced polyneuropathy: Secondary | ICD-10-CM | POA: Diagnosis not present

## 2021-09-15 DIAGNOSIS — G2581 Restless legs syndrome: Secondary | ICD-10-CM | POA: Diagnosis not present

## 2021-09-15 DIAGNOSIS — C20 Malignant neoplasm of rectum: Secondary | ICD-10-CM

## 2021-09-15 DIAGNOSIS — F319 Bipolar disorder, unspecified: Secondary | ICD-10-CM | POA: Insufficient documentation

## 2021-09-15 DIAGNOSIS — E785 Hyperlipidemia, unspecified: Secondary | ICD-10-CM | POA: Diagnosis not present

## 2021-09-15 DIAGNOSIS — N189 Chronic kidney disease, unspecified: Secondary | ICD-10-CM | POA: Diagnosis not present

## 2021-09-15 DIAGNOSIS — I129 Hypertensive chronic kidney disease with stage 1 through stage 4 chronic kidney disease, or unspecified chronic kidney disease: Secondary | ICD-10-CM | POA: Diagnosis not present

## 2021-09-15 LAB — CMP (CANCER CENTER ONLY)
ALT: 6 U/L (ref 0–44)
AST: 10 U/L — ABNORMAL LOW (ref 15–41)
Albumin: 3.5 g/dL (ref 3.5–5.0)
Alkaline Phosphatase: 109 U/L (ref 38–126)
Anion gap: 10 (ref 5–15)
BUN: 15 mg/dL (ref 8–23)
CO2: 31 mmol/L (ref 22–32)
Calcium: 9.7 mg/dL (ref 8.9–10.3)
Chloride: 100 mmol/L (ref 98–111)
Creatinine: 1.26 mg/dL — ABNORMAL HIGH (ref 0.61–1.24)
GFR, Estimated: >60 mL/min (ref >60)
Glucose, Bld: 120 mg/dL — ABNORMAL HIGH (ref 70–99)
Potassium: 4.6 mmol/L (ref 3.5–5.1)
Sodium: 141 mmol/L (ref 135–145)
Total Bilirubin: 0.5 mg/dL (ref 0.3–1.2)
Total Protein: 6.8 g/dL (ref 6.5–8.1)

## 2021-09-15 LAB — MAGNESIUM: Magnesium: 1.8 mg/dL (ref 1.7–2.4)

## 2021-09-15 MED ORDER — SODIUM CHLORIDE 0.9 % IV SOLN: INTRAVENOUS | Status: AC

## 2021-09-15 NOTE — Patient Instructions (Signed)
Rehydration, Adult Rehydration is the replacement of body fluids, salts, and minerals (electrolytes) that are lost during dehydration. Dehydration is when there is not enough water or other fluids in the body. This happens when you lose more fluids than you take in. Common causes of dehydration include: Not drinking enough fluids. This can occur when you are ill or doing activities that require a lot of energy, especially in hot weather. Conditions that cause loss of water or other fluids, such as diarrhea, vomiting, sweating, or urinating a lot. Other illnesses, such as fever or infection. Certain medicines, such as those that remove excess fluid from the body (diuretics). Symptoms of mild or moderate dehydration may include thirst, dry lips and mouth, and dizziness. Symptoms of severe dehydration may include increased heart rate, confusion, fainting, and not urinating. For severe dehydration, you may need to get fluids through an IV at the hospital. For mild or moderate dehydration, you can usually rehydrate at home by drinking certain fluids as told by your health care provider. What are the risks? Generally, rehydration is safe. However, taking in too much fluid (overhydration) can be a problem. This is rare. Overhydration can cause an electrolyte imbalance, kidney failure, or a decrease in salt (sodium) levels in the body. Supplies needed You will need an oral rehydration solution (ORS) if your health care provider tells you to use one. This is a drink to treat dehydration. It can be found in pharmacies and retail stores. How to rehydrate Fluids Follow instructions from your health care provider for rehydration. The kind of fluid and the amount you should drink depend on your condition. In general, you should choose drinks that you prefer. If told by your health care provider, drink an ORS. Make an ORS by following instructions on the package. Start by drinking small amounts, about  cup (120  mL) every 5-10 minutes. Slowly increase how much you drink until you have taken the amount recommended by your health care provider. Drink enough clear fluids to keep your urine pale yellow. If you were told to drink an ORS, finish it first, then start slowly drinking other clear fluids. Drink fluids such as: Water. This includes sparkling water and flavored water. Drinking only water can lead to having too little sodium in your body (hyponatremia). Follow the advice of your health care provider. Water from ice chips you suck on. Fruit juice with water you add to it (diluted). Sports drinks. Hot or cold herbal teas. Broth-based soups. Milk or milk products. Food Follow instructions from your health care provider about what to eat while you rehydrate. Your health care provider may recommend that you slowly begin eating regular foods in small amounts. Eat foods that contain a healthy balance of electrolytes, such as bananas, oranges, potatoes, tomatoes, and spinach. Avoid foods that are greasy or contain a lot of sugar. In some cases, you may get nutrition through a feeding tube that is passed through your nose and into your stomach (nasogastric tube, or NG tube). This may be done if you have uncontrolled vomiting or diarrhea. Beverages to avoid  Certain beverages may make dehydration worse. While you rehydrate, avoid drinking alcohol. How to tell if you are recovering from dehydration You may be recovering from dehydration if: You are urinating more often than before you started rehydrating. Your urine is pale yellow. Your energy level improves. You vomit less frequently. You have diarrhea less frequently. Your appetite improves or returns to normal. You feel less dizzy or less light-headed.   Your skin tone and color start to look more normal. Follow these instructions at home: Take over-the-counter and prescription medicines only as told by your health care provider. Do not take sodium  tablets. Doing this can lead to having too much sodium in your body (hypernatremia). Contact a health care provider if: You continue to have symptoms of mild or moderate dehydration, such as: Thirst. Dry lips. Slightly dry mouth. Dizziness. Dark urine or less urine than normal. Muscle cramps. You continue to vomit or have diarrhea. Get help right away if you: Have symptoms of dehydration that get worse. Have a fever. Have a severe headache. Have been vomiting and the following happens: Your vomiting gets worse or does not go away. Your vomit includes blood or green matter (bile). You cannot eat or drink without vomiting. Have problems with urination or bowel movements, such as: Diarrhea that gets worse or does not go away. Blood in your stool (feces). This may cause stool to look black and tarry. Not urinating, or urinating only a small amount of very dark urine, within 6-8 hours. Have trouble breathing. Have symptoms that get worse with treatment. These symptoms may represent a serious problem that is an emergency. Do not wait to see if the symptoms will go away. Get medical help right away. Call your local emergency services (911 in the [place] Do not drive yourself to the hospital. Summary Rehydration is the replacement of body fluids and minerals (electrolytes) that are lost during dehydration. Follow instructions from your health care provider for rehydration. The kind of fluid and amount you should drink depend on your condition. Slowly increase how much you drink until you have taken the amount recommended by your health care provider. Contact your health care provider if you continue to show signs of mild or moderate dehydration. This information is not intended to replace advice given to you by your health care provider. Make sure you discuss any questions you have with your health care provider. Document Revised: 04/25/2019 Document Reviewed: 03/05/2019 Elsevier Patient  Education  2023 [employer]  

## 2021-09-15 NOTE — Progress Notes (Signed)
  [facility] OFFICE PROGRESS NOTE   Diagnosis: Rectal cancer  INTERVAL HISTORY:   William Good returns as scheduled.  He underwent ileostomy takedown 09/03/2021.  He reports significant diarrhea since the surgery.  He began Lomotil yesterday, notes improvement.  He feels weak.  No lightheadedness or dizziness.  No fever.  No bleeding with bowel movements.  Appetite in general has been poor.  Objective:  Vital signs in last 24 hours:  Blood pressure (!) 145/90, pulse 72, temperature 98.1 F (36.7 C), temperature source Oral, resp. rate 18, height 6' (1.829 m), weight 200 lb 9.6 oz (91 kg), SpO2 100 %.    HEENT: Mucous membranes appear moist. Resp: Lungs clear bilaterally. Cardio: Regular, distant. GI: No hepatosplenomegaly.  Healing surgical incision right abdomen. Vascular: No leg edema. Skin: Decreased skin turgor.  Lab Results:  Lab Results  Component Value Date   WBC 11.9 (H) 09/04/2021   HGB 9.5 (L) 09/07/2021   HCT 31.6 (L) 09/04/2021   MCV 86.1 09/04/2021   PLT 204 09/04/2021   NEUTROABS 3.5 06/29/2021    Imaging:  No results found.  Medications: I have reviewed the patient's current medications.  Assessment/Plan: Rectal cancer Mass at 6 cm on digital exam/anoscopy 08/25/2020 Colonoscopy 09/05/2020-mid rectal mass-biopsy, adenocarcinoma, no loss of mismatch repair protein expression CT abdomen/pelvis 08/29/2020-small amount of free fluid in the pelvis, mildly enlarged left external iliac node, cystic changes of the left kidney with hydronephrosis and regions of cortical thinning with transition at the left UPJ suggesting chronic left UPJ obstruction MRI pelvis 09/24/2020-tumor 6 cm from the anal verge, 1.8 cm from the internal anal sphincter, T2, 7 mm lymph node in the left mesorectal sheath, N1 Cycle 1 FOLFOX 10/08/2020 Cycle 2 FOLFOX 10/22/2020 Cycle 3 FOLFOX 11/12/2020 (oxaliplatin dose reduced, white cell growth factor support on day of pump  discontinuation) Cycle 4 FOLFOX 11/26/2020, 5-FU bolus and infusion dose reduced secondary to diarrhea, Ziextenzo Cycle 5 FOLFOX 12/10/2020, 5-FU bolus held due to diarrhea, Ziextenzo Cycle 6 FOLFOX 12/24/2020, 5-FU bolus held, Ziextenzo Cycle 7 FOLFOX 01/07/2021, oxaliplatin held due to neuropathy, 5-FU bolus held, Ziextenzo held Cycle 8 FOLFOX 01/21/2021, oxaliplatin held due to neuropathy, 5-FU bolus held, Ziextenzo held Radiation/Xeloda 02/02/2021-03/16/2021 Low anterior resection/diverting loop ileostomy 05/27/2021, no residual carcinoma, 0/19 nodes, negative resection margins, ypT0,ypN0 Ileostomy takedown 09/03/2021     Diabetes Chronic kidney disease Bipolar disorder Hypertension Hyperlipidemia Restless leg syndrome Multiple colon polyps removed on the colonoscopy 09/05/2020-tubular adenomas including a rectal polyp with high-grade dysplasia-polypectomy margin negative for dysplasia Portacath placement, Dr Zenia Resides, 10/02/2020 Oxaliplatin neuropathy Diarrhea following ileostomy takedown procedure.  Improved with Lomotil.    Disposition: Mr. Granberg appears stable.  He underwent ileostomy takedown procedure 09/03/2021.  He notes significant diarrhea since.  He began Lomotil yesterday and notes improvement.  He appears mildly dehydrated.  He will receive a liter of normal saline today.  He has follow-up with Dr. Johney Maine next week.  We will see him in approximately 4 weeks.  He will contact the office in the interim with any problems.    Ned Card ANP/GNP-BC   09/15/2021  10:42 AM

## 2021-09-16 DIAGNOSIS — C20 Malignant neoplasm of rectum: Secondary | ICD-10-CM | POA: Diagnosis not present

## 2021-09-16 DIAGNOSIS — K219 Gastro-esophageal reflux disease without esophagitis: Secondary | ICD-10-CM | POA: Diagnosis not present

## 2021-09-16 DIAGNOSIS — I129 Hypertensive chronic kidney disease with stage 1 through stage 4 chronic kidney disease, or unspecified chronic kidney disease: Secondary | ICD-10-CM | POA: Diagnosis not present

## 2021-09-16 DIAGNOSIS — Z483 Aftercare following surgery for neoplasm: Secondary | ICD-10-CM | POA: Diagnosis not present

## 2021-09-18 ENCOUNTER — Encounter (HOSPITAL_COMMUNITY): Payer: Self-pay | Admitting: Nurse Practitioner

## 2021-09-22 DIAGNOSIS — I129 Hypertensive chronic kidney disease with stage 1 through stage 4 chronic kidney disease, or unspecified chronic kidney disease: Secondary | ICD-10-CM | POA: Diagnosis not present

## 2021-09-22 DIAGNOSIS — K219 Gastro-esophageal reflux disease without esophagitis: Secondary | ICD-10-CM | POA: Diagnosis not present

## 2021-09-22 DIAGNOSIS — Z483 Aftercare following surgery for neoplasm: Secondary | ICD-10-CM | POA: Diagnosis not present

## 2021-09-22 DIAGNOSIS — C20 Malignant neoplasm of rectum: Secondary | ICD-10-CM | POA: Diagnosis not present

## 2021-09-24 DIAGNOSIS — Z483 Aftercare following surgery for neoplasm: Secondary | ICD-10-CM | POA: Diagnosis not present

## 2021-09-24 DIAGNOSIS — I129 Hypertensive chronic kidney disease with stage 1 through stage 4 chronic kidney disease, or unspecified chronic kidney disease: Secondary | ICD-10-CM | POA: Diagnosis not present

## 2021-09-24 DIAGNOSIS — C20 Malignant neoplasm of rectum: Secondary | ICD-10-CM | POA: Diagnosis not present

## 2021-09-24 DIAGNOSIS — K219 Gastro-esophageal reflux disease without esophagitis: Secondary | ICD-10-CM | POA: Diagnosis not present

## 2021-09-28 ENCOUNTER — Other Ambulatory Visit: Payer: Self-pay

## 2021-09-28 DIAGNOSIS — I10 Essential (primary) hypertension: Secondary | ICD-10-CM | POA: Diagnosis not present

## 2021-09-28 DIAGNOSIS — K219 Gastro-esophageal reflux disease without esophagitis: Secondary | ICD-10-CM | POA: Diagnosis not present

## 2021-09-28 DIAGNOSIS — E1159 Type 2 diabetes mellitus with other circulatory complications: Secondary | ICD-10-CM | POA: Diagnosis not present

## 2021-09-28 DIAGNOSIS — C20 Malignant neoplasm of rectum: Secondary | ICD-10-CM | POA: Diagnosis not present

## 2021-09-28 DIAGNOSIS — Z483 Aftercare following surgery for neoplasm: Secondary | ICD-10-CM | POA: Diagnosis not present

## 2021-09-28 DIAGNOSIS — I129 Hypertensive chronic kidney disease with stage 1 through stage 4 chronic kidney disease, or unspecified chronic kidney disease: Secondary | ICD-10-CM | POA: Diagnosis not present

## 2021-09-28 DIAGNOSIS — N183 Chronic kidney disease, stage 3 unspecified: Secondary | ICD-10-CM | POA: Diagnosis not present

## 2021-09-28 DIAGNOSIS — E785 Hyperlipidemia, unspecified: Secondary | ICD-10-CM | POA: Diagnosis not present

## 2021-09-29 ENCOUNTER — Encounter (HOSPITAL_COMMUNITY): Payer: Self-pay | Admitting: Nurse Practitioner

## 2021-10-01 DIAGNOSIS — I129 Hypertensive chronic kidney disease with stage 1 through stage 4 chronic kidney disease, or unspecified chronic kidney disease: Secondary | ICD-10-CM | POA: Diagnosis not present

## 2021-10-01 DIAGNOSIS — K219 Gastro-esophageal reflux disease without esophagitis: Secondary | ICD-10-CM | POA: Diagnosis not present

## 2021-10-01 DIAGNOSIS — C20 Malignant neoplasm of rectum: Secondary | ICD-10-CM | POA: Diagnosis not present

## 2021-10-01 DIAGNOSIS — Z483 Aftercare following surgery for neoplasm: Secondary | ICD-10-CM | POA: Diagnosis not present

## 2021-10-08 DIAGNOSIS — C20 Malignant neoplasm of rectum: Secondary | ICD-10-CM | POA: Diagnosis not present

## 2021-10-08 DIAGNOSIS — K219 Gastro-esophageal reflux disease without esophagitis: Secondary | ICD-10-CM | POA: Diagnosis not present

## 2021-10-08 DIAGNOSIS — Z483 Aftercare following surgery for neoplasm: Secondary | ICD-10-CM | POA: Diagnosis not present

## 2021-10-08 DIAGNOSIS — I129 Hypertensive chronic kidney disease with stage 1 through stage 4 chronic kidney disease, or unspecified chronic kidney disease: Secondary | ICD-10-CM | POA: Diagnosis not present

## 2021-10-09 DIAGNOSIS — Z483 Aftercare following surgery for neoplasm: Secondary | ICD-10-CM | POA: Diagnosis not present

## 2021-10-09 DIAGNOSIS — C20 Malignant neoplasm of rectum: Secondary | ICD-10-CM | POA: Diagnosis not present

## 2021-10-09 DIAGNOSIS — I129 Hypertensive chronic kidney disease with stage 1 through stage 4 chronic kidney disease, or unspecified chronic kidney disease: Secondary | ICD-10-CM | POA: Diagnosis not present

## 2021-10-09 DIAGNOSIS — K219 Gastro-esophageal reflux disease without esophagitis: Secondary | ICD-10-CM | POA: Diagnosis not present

## 2021-10-13 ENCOUNTER — Inpatient Hospital Stay: Payer: Medicare Other | Attending: Oncology

## 2021-10-13 ENCOUNTER — Other Ambulatory Visit: Payer: Self-pay

## 2021-10-13 ENCOUNTER — Inpatient Hospital Stay: Payer: Medicare Other | Admitting: Oncology

## 2021-10-13 VITALS — BP 105/71 | HR 72 | Temp 98.1°F | Resp 20 | Ht 72.0 in | Wt 201.6 lb

## 2021-10-13 DIAGNOSIS — F319 Bipolar disorder, unspecified: Secondary | ICD-10-CM | POA: Insufficient documentation

## 2021-10-13 DIAGNOSIS — E785 Hyperlipidemia, unspecified: Secondary | ICD-10-CM | POA: Insufficient documentation

## 2021-10-13 DIAGNOSIS — C20 Malignant neoplasm of rectum: Secondary | ICD-10-CM

## 2021-10-13 DIAGNOSIS — Z85048 Personal history of other malignant neoplasm of rectum, rectosigmoid junction, and anus: Secondary | ICD-10-CM | POA: Insufficient documentation

## 2021-10-13 DIAGNOSIS — Z9221 Personal history of antineoplastic chemotherapy: Secondary | ICD-10-CM | POA: Diagnosis not present

## 2021-10-13 DIAGNOSIS — G2581 Restless legs syndrome: Secondary | ICD-10-CM | POA: Diagnosis not present

## 2021-10-13 DIAGNOSIS — R152 Fecal urgency: Secondary | ICD-10-CM | POA: Diagnosis not present

## 2021-10-13 DIAGNOSIS — N189 Chronic kidney disease, unspecified: Secondary | ICD-10-CM | POA: Insufficient documentation

## 2021-10-13 DIAGNOSIS — E1122 Type 2 diabetes mellitus with diabetic chronic kidney disease: Secondary | ICD-10-CM | POA: Insufficient documentation

## 2021-10-13 LAB — CMP (CANCER CENTER ONLY)
ALT: 6 U/L (ref 0–44)
AST: 10 U/L — ABNORMAL LOW (ref 15–41)
Albumin: 3.4 g/dL — ABNORMAL LOW (ref 3.5–5.0)
Alkaline Phosphatase: 101 U/L (ref 38–126)
Anion gap: 11 (ref 5–15)
BUN: 13 mg/dL (ref 8–23)
CO2: 29 mmol/L (ref 22–32)
Calcium: 9.4 mg/dL (ref 8.9–10.3)
Chloride: 100 mmol/L (ref 98–111)
Creatinine: 1.55 mg/dL — ABNORMAL HIGH (ref 0.61–1.24)
GFR, Estimated: 49 mL/min — ABNORMAL LOW (ref >60)
Glucose, Bld: 139 mg/dL — ABNORMAL HIGH (ref 70–99)
Potassium: 4.4 mmol/L (ref 3.5–5.1)
Sodium: 140 mmol/L (ref 135–145)
Total Bilirubin: 0.5 mg/dL (ref 0.3–1.2)
Total Protein: 6.8 g/dL (ref 6.5–8.1)

## 2021-10-13 NOTE — Progress Notes (Signed)
[place] OFFICE PROGRESS NOTE   Diagnosis: Rectal cancer  INTERVAL HISTORY:   William. William Good returns as scheduled.  He is living at home.  He continues to have peripheral numbness and tingling.  He has frequent bowel movements.  He reports small-volume semiformed stool.  He has fecal urgency.  He is followed by Dr. Johney Maine.  He continues a bowel regimen.  Objective:  Vital signs in last 24 hours:  Blood pressure 105/71, pulse 72, temperature 98.1 F (36.7 C), temperature source Oral, resp. rate 20, height 6' (1.829 m), weight 201 lb 9.6 oz (91.4 kg), SpO2 98 %.    HEENT: No thrush  Resp: Lungs clear bilaterally Cardio: Distant heart sounds, regular rhythm GI: No hepatosplenomegaly, firm smooth fullness in the lateral left mid abdomen Vascular: No leg edema   Lab Results:  Lab Results  Component Value Date   WBC 11.9 (H) 09/04/2021   HGB 9.5 (L) 09/07/2021   HCT 31.6 (L) 09/04/2021   MCV 86.1 09/04/2021   PLT 204 09/04/2021   NEUTROABS 3.5 06/29/2021    CMP  Lab Results  Component Value Date   NA 140 10/13/2021   K 4.4 10/13/2021   CL 100 10/13/2021   CO2 29 10/13/2021   GLUCOSE 139 (H) 10/13/2021   BUN 13 10/13/2021   CREATININE 1.55 (H) 10/13/2021   CALCIUM 9.4 10/13/2021   PROT 6.8 10/13/2021   ALBUMIN 3.4 (L) 10/13/2021   AST 10 (L) 10/13/2021   ALT 6 10/13/2021   ALKPHOS 101 10/13/2021   BILITOT 0.5 10/13/2021   GFRNONAA 49 (L) 10/13/2021   GFRAA >60 01/17/2017    Lab Results  Component Value Date   CEA 3.54 04/14/2021    Medications: I have reviewed the patient's current medications.   Assessment/Plan: Rectal cancer Mass at 6 cm on digital exam/anoscopy 08/25/2020 Colonoscopy 09/05/2020-mid rectal mass-biopsy, adenocarcinoma, no loss of mismatch repair protein expression CT abdomen/pelvis 08/29/2020-small amount of free fluid in the pelvis, mildly enlarged left external iliac node, cystic changes of the left kidney with  hydronephrosis and regions of cortical thinning with transition at the left UPJ suggesting chronic left UPJ obstruction MRI pelvis 09/24/2020-tumor 6 cm from the anal verge, 1.8 cm from the internal anal sphincter, T2, 7 mm lymph node in the left mesorectal sheath, N1 Cycle 1 FOLFOX 10/08/2020 Cycle 2 FOLFOX 10/22/2020 Cycle 3 FOLFOX 11/12/2020 (oxaliplatin dose reduced, white cell growth factor support on day of pump discontinuation) Cycle 4 FOLFOX 11/26/2020, 5-FU bolus and infusion dose reduced secondary to diarrhea, Ziextenzo Cycle 5 FOLFOX 12/10/2020, 5-FU bolus held due to diarrhea, Ziextenzo Cycle 6 FOLFOX 12/24/2020, 5-FU bolus held, Ziextenzo Cycle 7 FOLFOX 01/07/2021, oxaliplatin held due to neuropathy, 5-FU bolus held, Ziextenzo held Cycle 8 FOLFOX 01/21/2021, oxaliplatin held due to neuropathy, 5-FU bolus held, Ziextenzo held Radiation/Xeloda 02/02/2021-03/16/2021 Low anterior resection/diverting loop ileostomy 05/27/2021, no residual carcinoma, 0/19 nodes, negative resection margins, ypT0,ypN0 Ileostomy takedown 09/03/2021     Diabetes Chronic kidney disease Bipolar disorder Hypertension Hyperlipidemia Restless leg syndrome Multiple colon polyps removed on the colonoscopy 09/05/2020-tubular adenomas including a rectal polyp with high-grade dysplasia-polypectomy margin negative for dysplasia Portacath placement, Dr Zenia Resides, 10/02/2020 Oxaliplatin neuropathy Diarrhea following ileostomy takedown procedure.  Improved with Lomotil.     Disposition: William Good is in clinical remission from rectal cancer.  He continues to recover from the low anterior resection and ileostomy takedown.  He has frequent semiformed bowel movements and difficulty with urgency/control.  He is followed closely by Dr.  Gross.  I made a referral to the pelvic physical therapy clinic. The etiology of the left abdominal fullness is unclear.  He is scheduled to see Dr. Gross next week.  This could be a hernia or another  postoperative phenomenon.  He will decrease the potassium to once daily.  I encouraged him to increase his diet as tolerated.  He will return for an office visit in 6 weeks.   , MD  10/13/2021  1:14 PM   

## 2021-10-14 ENCOUNTER — Ambulatory Visit: Payer: Medicare Other | Attending: Oncology | Admitting: Physical Therapy

## 2021-10-14 ENCOUNTER — Other Ambulatory Visit: Payer: Self-pay

## 2021-10-14 ENCOUNTER — Other Ambulatory Visit: Payer: Self-pay | Admitting: *Deleted

## 2021-10-14 DIAGNOSIS — M6281 Muscle weakness (generalized): Secondary | ICD-10-CM | POA: Diagnosis not present

## 2021-10-14 DIAGNOSIS — C20 Malignant neoplasm of rectum: Secondary | ICD-10-CM | POA: Diagnosis not present

## 2021-10-14 DIAGNOSIS — R279 Unspecified lack of coordination: Secondary | ICD-10-CM | POA: Diagnosis not present

## 2021-10-14 DIAGNOSIS — R293 Abnormal posture: Secondary | ICD-10-CM | POA: Diagnosis not present

## 2021-10-14 DIAGNOSIS — M62838 Other muscle spasm: Secondary | ICD-10-CM | POA: Insufficient documentation

## 2021-10-14 LAB — MAGNESIUM: Magnesium: 1.8 mg/dL (ref 1.7–2.4)

## 2021-10-14 NOTE — Therapy (Unsigned)
OUTPATIENT PHYSICAL THERAPY MALE PELVIC EVALUATION   Patient Name: William Good MRN: [id number] DOB:08/14/1955, 66 y.o., male Today's Date: 10/14/2021   PT End of Session - 10/14/21 1530     Visit Number 1    Date for PT Re-Evaluation 01/14/22    Authorization Type [employer] medicare    Progress Note Due on Visit 10    PT Start Time 1530    PT Stop Time 1610    PT Time Calculation (min) 40 min             Past Medical History:  Diagnosis Date   Anxiety    Back pain    Bipolar disorder (HCC)    bipolar 1   CKD (chronic kidney disease) stage 2, GFR 60-89 ml/min 09/24/2020   GERD (gastroesophageal reflux disease)    High output ileostomy (HCC) 05/29/2021   Hypertension    Knee pain    Migraine    Rectal cancer (HCC)    Type 2 diabetes mellitus with complication, without long-term current use of insulin (HCC) 03/19/2021   no meds   Past Surgical History:  Procedure Laterality Date   BACK SURGERY     ILEO LOOP DIVERSION N/A 09/03/2021   Procedure: OPEN TAKEDOWN OF LOOP ILEOSTOMY;  Surgeon: Karie Soda, MD;  Location: [facility];  Service: General;  Laterality: N/A;   JOINT REPLACEMENT Right    knee   OSTOMY N/A 05/27/2021   Procedure: POSSIBLE OSTOMY;  Surgeon: Karie Soda, MD;  Location: [facility];  Service: General;  Laterality: N/A;   PORT-A-CATH REMOVAL Right 09/03/2021   Procedure: REMOVAL PORT-A-CATH;  Surgeon: Karie Soda, MD;  Location: [facility];  Service: General;  Laterality: Right;   PORTACATH PLACEMENT N/A 10/02/2020   Procedure: PORT-A-CATH PLACEMENT;  Surgeon: Fritzi Mandes, MD;  Location: [facility];  Service: General;  Laterality: N/A;   PROCTOSCOPY N/A 05/27/2021   Procedure: RIGID PROCTOSCOPY;  Surgeon: Karie Soda, MD;  Location: [facility];  Service: General;  Laterality: N/A;   XI ROBOTIC ASSISTED LOWER ANTERIOR RESECTION N/A 05/27/2021   Procedure: ROBOTIC LOW ANTERIOR COLON RESECTION, VERY LOW COLOANAL ANASTOMOSIS, DIVERTING ILEOSTOMY, BILATERAL TAP  BLOCK,  FIRFLY INJECTION;  Surgeon: Karie Soda, MD;  Location: [facility];  Service: General;  Laterality: N/A;   Patient Active Problem List   Diagnosis Date Noted   Complicated UTI (urinary tract infection) 07/13/2021   Weight loss 07/07/2021   Hematuria 07/03/2021   Hypercoagulable state associated with COVID-19 (HCC) 06/29/2021   Severe protein-calorie malnutrition (HCC) 06/25/2021   Acute renal failure superimposed on stage 3b chronic kidney disease (HCC) 06/24/2021   COVID-19 with comorbid diabetes mellitus (HCC) 06/17/2021   CRP elevated 06/17/2021   Hypertension associated with stage 3a chronic kidney disease due to type 2 diabetes mellitus (HCC) 06/11/2021   Hyperlipidemia associated with type 2 diabetes mellitus (HCC) 06/11/2021   CKD stage 3 due to type 2 diabetes mellitus (HCC) 06/11/2021   CAD (coronary artery disease) 06/11/2021   Hypertrophic cardiomyopathy (HCC) 06/11/2021   Aortic atherosclerosis (HCC) 06/11/2021   Acute blood loss anemia 06/09/2021   Coronary artery calcification 05/28/2021   AKI (acute kidney injury) (HCC) 05/28/2021   Bipolar disorder (HCC) 05/27/2021   Polycystic kidney 05/27/2021   Type 2 diabetes mellitus with other circulatory complications (HCC) 05/27/2021   Tobacco abuse 05/27/2021   Preoperative clearance 03/19/2021   Type 2 diabetes mellitus with complication, without long-term current use of insulin (HCC) 03/19/2021   Gastroesophageal reflux disease 03/17/2021  Hyperlipidemia 03/17/2021   Benign prostatic hyperplasia with lower urinary tract symptoms 03/17/2021   Migraine without aura, not refractory 03/17/2021   Obesity 03/17/2021   Restless legs 03/17/2021   Severe recurrent major depression with psychotic features ([place]) 03/17/2021   Vitamin D deficiency 03/17/2021   Chronic kidney disease, stage 3 unspecified ([place]) 09/24/2020   Obstruction of left ureteropelvic junction (UPJ) 09/24/2020   Rectal cancer ([place]) 09/16/2020   Prolapsed  internal hemorrhoids, grade 2 09/16/2020   Migraine 09/16/2020   Essential hypertension 01/16/2010    PCP: Aura Dials, MD  REFERRING PROVIDER: Ladell Pier, MD  REFERRING DIAG: C20 (ICD-10-CM) - Rectal cancer [facility])  THERAPY DIAG:  Muscle weakness (generalized)  Unspecified lack of coordination  Other muscle spasm  Abnormal posture  Rationale for Evaluation and Treatment Rehabilitation  ONSET DATE: 09/03/21  SUBJECTIVE:                                                                                                                                                                                           SUBJECTIVE STATEMENT: Pt reports he has had several bowel movements per day since removal of ostomy and sometime incontinent of stool  Fluid intake: Yes: 3 glasses of water per day     PAIN:  Are you having pain? Yes NPRS scale: 5/10 at worst, usually no pain at rest, and a little pain with urge Pain location:  at surgery site  Pain type: aching Pain description: intermittent   Aggravating factors: urgency or over doing mobility  Relieving factors: rest   PRECAUTIONS: None  WEIGHT BEARING RESTRICTIONS No  FALLS:  Has patient fallen in last 6 months? No  LIVING ENVIRONMENT: Lives with: lives alone Lives in: House/apartment I at baseline but has help nearby, does not drive  OCCUPATION: retired   PLOF: Independent  PATIENT GOALS to have more regular bowel movements  PERTINENT HISTORY:  Rectal cancer Mass at 6 cm on digital exam/anoscopy 08/25/2020 Colonoscopy 09/05/2020-mid rectal mass-biopsy, adenocarcinoma, no loss of mismatch repair protein expression CT abdomen/pelvis 08/29/2020-small amount of free fluid in the pelvis, mildly enlarged left external iliac node, cystic changes of the left kidney with hydronephrosis and regions of cortical thinning with transition at the left UPJ suggesting chronic left UPJ obstruction MRI pelvis 09/24/2020-tumor 6 cm  from the anal verge, 1.8 cm from the internal anal sphincter, T2, 7 mm lymph node in the left mesorectal sheath, N1 Cycle 1 FOLFOX 10/08/2020 Cycle 2 FOLFOX 10/22/2020 Cycle 3 FOLFOX 11/12/2020 (oxaliplatin dose reduced, white cell growth factor support on day of pump discontinuation) Cycle 4 FOLFOX 11/26/2020, 5-FU bolus and infusion dose reduced  secondary to diarrhea, Ziextenzo Cycle 5 FOLFOX 12/10/2020, 5-FU bolus held due to diarrhea, Ziextenzo Cycle 6 FOLFOX 12/24/2020, 5-FU bolus held, Ziextenzo Cycle 7 FOLFOX 01/07/2021, oxaliplatin held due to neuropathy, 5-FU bolus held, Ziextenzo held Cycle 8 FOLFOX 01/21/2021, oxaliplatin held due to neuropathy, 5-FU bolus held, Ziextenzo held Radiation/Xeloda 02/02/2021-03/16/2021 Low anterior resection/diverting loop ileostomy 05/27/2021, no residual carcinoma, 0/19 nodes, negative resection margins, [id number],ypN0 Ileostomy takedown 09/03/2021 Sexual abuse: No  BOWEL MOVEMENT Pain with bowel movement: No Type of bowel movement:Type (Bristol Stool Scale) 4-5, Frequency 9-10x, and Strain No Fully empty rectum: No Leakage: Yes: small amounts, not getting to the bathroom quick enough Pads: No Fiber supplement: No is taking imodium, Lomotil  URINATION Pain with urination: No Fully empty bladder: Yes:   Stream: Strong Urgency: No Frequency: not more than every 2 hours, not usually at night Leakage:  not  Pads: No  OBJECTIVE:   DIAGNOSTIC FINDINGS:    COGNITION:  Overall cognitive status: Impaired with chemo     SENSATION:  Light touch: Deficits neuropathy in bil feet since chemo   Proprioception: Appears intact  MUSCLE LENGTH: Bil hamstrings and adductors limited by 50%   FUNCTIONAL TESTS:  Functional  squat: unable to complete without holding on with UE 5X STS 26s  GAIT: Distance walked: 200' Assistive device utilized: None Level of assistance: Modified independence Comments: shuffled steps, decreased bil step height, trunk flexed,  decreased arm swing, poor cadence.                POSTURE: rounded shoulders, forward head, and posterior pelvic tilt   LUMBARAROM/PROM  A/PROM A/PROM  eval  Flexion Limited by 75%  Extension Limited by 50%  Right lateral flexion Limited by 75%  Left lateral flexion Limited by 75%  Right rotation Limited by 75%  Left rotation Limited by 75%   (Blank rows = not tested)  LOWER EXTREMITY ROM:  WFL bil   LOWER EXTREMITY MMT: Bil hip abduction 3+/5, flexion 4/5, extension and adduction 4/5; knees and ankles 4+/5   PALPATION:   General  fascial restrictions throughout all abdominal quadrants, decreased mobility around scar site in all directions, Lt side of abdomen much more restricted compared to right and firm.                 External Perineal Exam deferred                              Internal Pelvic Floor deferred  Patient confirms identification and approves PT to assess internal pelvic floor and treatment No  PELVIC MMT:   MMT eval  Vaginal   Internal Anal Sphincter   External Anal Sphincter   Puborectalis   Diastasis Recti   (Blank rows = not tested)        TONE: deferred  PROLAPSE: deferred  TODAY'S TREATMENT  EVAL Examination completed, findings reviewed, pt educated on POC, HEP, and abdominal massage. Pt motivated to participate in PT and agreeable to attempt recommendations.     PATIENT EDUCATION:  Education details: [id number] Person educated: Patient and Caregiver friend Education method: Consulting civil engineer, Media planner, Corporate treasurer cues, Verbal cues, and Handouts Education comprehension: verbalized understanding and returned demonstration   HOME EXERCISE PROGRAM: [id number]  ASSESSMENT:  CLINICAL IMPRESSION: Patient is a 66 y.o. male  who was seen today for physical therapy evaluation and treatment for fecal urgency, frequency, and incontinence status post rectal cancer with ileostomy and takedown (09/03/21). Pt found  to have impaired balance, gait  mechanics, is I at home but has had a cognitive decline since chemo and has family/friends to check in at home and drive pt everywhere. Pt reports 8-10 bowel movements per day, intermittent fecal incontinence episodes with pt unable to get to bathroom quickly enough and reports he feels limited in his mobility as well as having strong urgency. Pt reports he doesn't feel empty post voids and medication has helped with urgency but not completely resolved. Pt also had decreased hip strength, core strength, fascial restrictions around scar site and throughout abdomen. Pt would benefit from additional PT to further address deficits.     OBJECTIVE IMPAIRMENTS decreased activity tolerance, decreased balance, decreased cognition, decreased coordination, decreased endurance, decreased mobility, difficulty walking, decreased strength, increased fascial restrictions, impaired flexibility, improper body mechanics, and postural dysfunction.   ACTIVITY LIMITATIONS continence  PARTICIPATION LIMITATIONS: community activity  PERSONAL FACTORS Past/current experiences, Time since onset of injury/illness/exacerbation, and 1 comorbidity: medical history  are also affecting patient's functional outcome.   REHAB POTENTIAL: Good  CLINICAL DECISION MAKING: Stable/uncomplicated  EVALUATION COMPLEXITY: Low   GOALS: Goals reviewed with patient? Yes  SHORT TERM GOALS: Target date: 11/11/2021  Pt to be I with HEP.  Baseline Goal status: INITIAL  2.  Pt will report 6 BMs per day due to improved muscle tone and coordination with BMs.  Baseline:  Goal status: INITIAL  3. Pt to demonstrate improved 5X STS to 18s for decreased fall risk.  Baseline:  Goal status: INITIAL   LONG TERM GOALS: Target date:  01/14/2022    Pt to be I with advanced HEP.  Baseline:  Goal status: INITIAL  2.  Pt to demonstrate at least 4/5 bil hip strength for improved pelvic stability and functional squats without leakage.  Baseline:   Goal status: INITIAL  3.  Pt will report 4 BMs per day  due to improved muscle tone and coordination with BMs.  Baseline:  Goal status: INITIAL  4.  Pt to demonstrate at least 4/5 pelvic floor strength for improved pelvic stability and decreased strain at pelvic floor/ decrease leakage.  Baseline:  Goal status: INITIAL  5.  Pt will report her BMs are complete due to improved bowel habits and evacuation techniques.  Baseline:  Goal status: INITIAL  6.  Pt to demonstrate improved 5X STS to 13s for decreased fall risk.  Baseline:  Goal status: INITIAL  PLAN: PT FREQUENCY: 1x/week  PT DURATION:  8 sessions  PLANNED INTERVENTIONS: Therapeutic exercises, Therapeutic activity, Neuromuscular re-education, Gait training, Patient/Family education, Self Care, Joint mobilization, DME instructions, Dry Needling, Spinal mobilization, Cryotherapy, Moist heat, scar mobilization, Taping, Vasopneumatic device, Biofeedback, and Manual therapy  PLAN FOR NEXT SESSION: hip and core strengthening, review any handouts, pelvic floor coordination/breathing, voiding mechanics    Stacy Gardner, PT, DPT 08/10/237:53 AM

## 2021-10-14 NOTE — Patient Instructions (Signed)
  *  DO ABDOMINAL MASSAGE [name] FOR 5 MINS MORNING AND EVENING    * LAY FLAT FOR 5-10 MINS EVERY DAY

## 2021-10-15 ENCOUNTER — Other Ambulatory Visit: Payer: Self-pay

## 2021-10-15 DIAGNOSIS — Z483 Aftercare following surgery for neoplasm: Secondary | ICD-10-CM | POA: Diagnosis not present

## 2021-10-15 DIAGNOSIS — C20 Malignant neoplasm of rectum: Secondary | ICD-10-CM | POA: Diagnosis not present

## 2021-10-15 DIAGNOSIS — K219 Gastro-esophageal reflux disease without esophagitis: Secondary | ICD-10-CM | POA: Diagnosis not present

## 2021-10-15 DIAGNOSIS — I129 Hypertensive chronic kidney disease with stage 1 through stage 4 chronic kidney disease, or unspecified chronic kidney disease: Secondary | ICD-10-CM | POA: Diagnosis not present

## 2021-10-26 ENCOUNTER — Other Ambulatory Visit: Payer: Self-pay | Admitting: Adult Health

## 2021-10-28 ENCOUNTER — Ambulatory Visit: Payer: Medicare Other | Admitting: Cardiology

## 2021-11-05 ENCOUNTER — Ambulatory Visit: Payer: Medicare Other | Admitting: Physical Therapy

## 2021-11-05 DIAGNOSIS — C20 Malignant neoplasm of rectum: Secondary | ICD-10-CM | POA: Diagnosis not present

## 2021-11-05 DIAGNOSIS — M6281 Muscle weakness (generalized): Secondary | ICD-10-CM | POA: Diagnosis not present

## 2021-11-05 DIAGNOSIS — R293 Abnormal posture: Secondary | ICD-10-CM | POA: Diagnosis not present

## 2021-11-05 DIAGNOSIS — R279 Unspecified lack of coordination: Secondary | ICD-10-CM | POA: Diagnosis not present

## 2021-11-05 DIAGNOSIS — M62838 Other muscle spasm: Secondary | ICD-10-CM | POA: Diagnosis not present

## 2021-11-05 NOTE — Therapy (Signed)
OUTPATIENT PHYSICAL THERAPY MALE PELVIC EVALUATION   Patient Name: William Good MRN: [id number] DOB:1955/09/09, 66 y.o., male Today's Date: 11/05/2021   PT End of Session - 11/05/21 1058     Visit Number 2    Date for PT Re-Evaluation 01/14/22    Authorization Type [employer] medicare    Progress Note Due on Visit 10    PT Start Time 1100    PT Stop Time 1138    PT Time Calculation (min) 38 min    Activity Tolerance Patient tolerated treatment well    Behavior During Therapy WFL for tasks assessed/performed             Past Medical History:  Diagnosis Date   Anxiety    Back pain    Bipolar disorder ([place])    bipolar 1   CKD (chronic kidney disease) stage 2, GFR 60-89 ml/min 09/24/2020   GERD (gastroesophageal reflux disease)    High output ileostomy ([place]) 05/29/2021   Hypertension    Knee pain    Migraine    Rectal cancer ([place])    Type 2 diabetes mellitus with complication, without long-term current use of insulin ([place]) 03/19/2021   no meds   Past Surgical History:  Procedure Laterality Date   BACK SURGERY     ILEO LOOP DIVERSION N/A 09/03/2021   Procedure: OPEN TAKEDOWN OF LOOP ILEOSTOMY;  Surgeon: Michael Boston, MD;  Location: [facility];  Service: General;  Laterality: N/A;   JOINT REPLACEMENT Right    knee   OSTOMY N/A 05/27/2021   Procedure: POSSIBLE OSTOMY;  Surgeon: Michael Boston, MD;  Location: [facility];  Service: General;  Laterality: N/A;   PORT-A-CATH REMOVAL Right 09/03/2021   Procedure: REMOVAL PORT-A-CATH;  Surgeon: Michael Boston, MD;  Location: [facility];  Service: General;  Laterality: Right;   PORTACATH PLACEMENT N/A 10/02/2020   Procedure: PORT-A-CATH PLACEMENT;  Surgeon: Dwan Bolt, MD;  Location: [facility];  Service: General;  Laterality: N/A;   PROCTOSCOPY N/A 05/27/2021   Procedure: RIGID PROCTOSCOPY;  Surgeon: Michael Boston, MD;  Location: [facility];  Service: General;  Laterality: N/A;   XI ROBOTIC ASSISTED LOWER ANTERIOR RESECTION N/A 05/27/2021    Procedure: ROBOTIC LOW ANTERIOR COLON RESECTION, VERY LOW COLOANAL ANASTOMOSIS, DIVERTING ILEOSTOMY, BILATERAL TAP BLOCK,  FIRFLY INJECTION;  Surgeon: Michael Boston, MD;  Location: [facility];  Service: General;  Laterality: N/A;   Patient Active Problem List   Diagnosis Date Noted   Complicated UTI (urinary tract infection) 07/13/2021   Weight loss 07/07/2021   Hematuria 07/03/2021   Hypercoagulable state associated with COVID-19 ([place]) 06/29/2021   Severe protein-calorie malnutrition ([place]) 06/25/2021   Acute renal failure superimposed on stage 3b chronic kidney disease ([place]) 06/24/2021   COVID-19 with comorbid diabetes mellitus ([place]) 06/17/2021   CRP elevated 06/17/2021   Hypertension associated with stage 3a chronic kidney disease due to type 2 diabetes mellitus ([place]) 06/11/2021   Hyperlipidemia associated with type 2 diabetes mellitus ([place]) 06/11/2021   CKD stage 3 due to type 2 diabetes mellitus ([place]) 06/11/2021   CAD (coronary artery disease) 06/11/2021   Hypertrophic cardiomyopathy ([place]) 06/11/2021   Aortic atherosclerosis ([place]) 06/11/2021   Acute blood loss anemia 06/09/2021   Coronary artery calcification 05/28/2021   AKI (acute kidney injury) ([place]) 05/28/2021   Bipolar disorder ([place]) 05/27/2021   Polycystic kidney 05/27/2021   Type 2 diabetes mellitus with other circulatory complications ([place]) 65/68/1275   Tobacco abuse 05/27/2021   Preoperative clearance 03/19/2021   Type 2  diabetes mellitus with complication, without long-term current use of insulin ([place]) 03/19/2021   Gastroesophageal reflux disease 03/17/2021   Hyperlipidemia 03/17/2021   Benign prostatic hyperplasia with lower urinary tract symptoms 03/17/2021   Migraine without aura, not refractory 03/17/2021   Obesity 03/17/2021   Restless legs 03/17/2021   Severe recurrent major depression with psychotic features ([place]) 03/17/2021   Vitamin D deficiency 03/17/2021   Chronic kidney disease, stage 3 unspecified ([place])  09/24/2020   Obstruction of left ureteropelvic junction (UPJ) 09/24/2020   Rectal cancer ([place]) 09/16/2020   Prolapsed internal hemorrhoids, grade 2 09/16/2020   Migraine 09/16/2020   Essential hypertension 01/16/2010    PCP: Aura Dials, MD  REFERRING PROVIDER: Ladell Pier, MD  REFERRING DIAG: C20 (ICD-10-CM) - Rectal cancer [facility])  THERAPY DIAG:  Muscle weakness (generalized)  Unspecified lack of coordination  Abnormal posture  Rationale for Evaluation and Treatment Rehabilitation  ONSET DATE: 09/03/21  SUBJECTIVE:                                                                                                                                                                                           SUBJECTIVE STATEMENT: Pt states he is now having usually one bowel movement per day usually in the morning but take 3-4 times of having small amounts then fully empty. No straining reports he has tried a step stool but doesn't think this helped emptying. Is doing abdominal massage morning and night every day.   Fluid intake: Yes: 3 glasses of water per day     PAIN:  Are you having pain? Yes NPRS scale: 5/10 at worst, usually no pain at rest, and a little pain with urge Pain location:  at surgery site  Pain type: aching Pain description: intermittent   Aggravating factors: urgency or over doing mobility  Relieving factors: rest   PRECAUTIONS: None  WEIGHT BEARING RESTRICTIONS No  FALLS:  Has patient fallen in last 6 months? No  LIVING ENVIRONMENT: Lives with: lives alone Lives in: House/apartment I at baseline but has help nearby, does not drive  OCCUPATION: retired   PLOF: Independent  PATIENT GOALS to have more regular bowel movements  PERTINENT HISTORY:  Rectal cancer Mass at 6 cm on digital exam/anoscopy 08/25/2020 Colonoscopy 09/05/2020-mid rectal mass-biopsy, adenocarcinoma, no loss of mismatch repair protein expression CT abdomen/pelvis  08/29/2020-small amount of free fluid in the pelvis, mildly enlarged left external iliac node, cystic changes of the left kidney with hydronephrosis and regions of cortical thinning with transition at the left UPJ suggesting chronic left UPJ obstruction MRI pelvis 09/24/2020-tumor 6 cm from the anal verge, 1.8 cm from the  internal anal sphincter, T2, 7 mm lymph node in the left mesorectal sheath, N1 Cycle 1 FOLFOX 10/08/2020 Cycle 2 FOLFOX 10/22/2020 Cycle 3 FOLFOX 11/12/2020 (oxaliplatin dose reduced, white cell growth factor support on day of pump discontinuation) Cycle 4 FOLFOX 11/26/2020, 5-FU bolus and infusion dose reduced secondary to diarrhea, Ziextenzo Cycle 5 FOLFOX 12/10/2020, 5-FU bolus held due to diarrhea, Ziextenzo Cycle 6 FOLFOX 12/24/2020, 5-FU bolus held, Ziextenzo Cycle 7 FOLFOX 01/07/2021, oxaliplatin held due to neuropathy, 5-FU bolus held, Ziextenzo held Cycle 8 FOLFOX 01/21/2021, oxaliplatin held due to neuropathy, 5-FU bolus held, Ziextenzo held Radiation/Xeloda 02/02/2021-03/16/2021 Low anterior resection/diverting loop ileostomy 05/27/2021, no residual carcinoma, 0/19 nodes, negative resection margins, [id number],ypN0 Ileostomy takedown 09/03/2021 Sexual abuse: No  BOWEL MOVEMENT Pain with bowel movement: No Type of bowel movement:Type (Bristol Stool Scale) 4-5, Frequency 9-10x, and Strain No Fully empty rectum: No Leakage: Yes: small amounts, not getting to the bathroom quick enough Pads: No Fiber supplement: No is taking imodium, Lomotil  URINATION Pain with urination: No Fully empty bladder: Yes:   Stream: Strong Urgency: No Frequency: not more than every 2 hours, not usually at night Leakage:  not  Pads: No  OBJECTIVE:   DIAGNOSTIC FINDINGS:    COGNITION:  Overall cognitive status: Impaired with chemo     SENSATION:  Light touch: Deficits neuropathy in bil feet since chemo   Proprioception: Appears intact  MUSCLE LENGTH: Bil hamstrings and adductors limited by  50%   FUNCTIONAL TESTS:  Functional  squat: unable to complete without holding on with UE 5X STS 26s  GAIT: Distance walked: 200' Assistive device utilized: None Level of assistance: Modified independence Comments: shuffled steps, decreased bil step height, trunk flexed, decreased arm swing, poor cadence.                POSTURE: rounded shoulders, forward head, and posterior pelvic tilt   LUMBARAROM/PROM  A/PROM A/PROM  eval  Flexion Limited by 75%  Extension Limited by 50%  Right lateral flexion Limited by 75%  Left lateral flexion Limited by 75%  Right rotation Limited by 75%  Left rotation Limited by 75%   (Blank rows = not tested)  LOWER EXTREMITY ROM:  WFL bil   LOWER EXTREMITY MMT: Bil hip abduction 3+/5, flexion 4/5, extension and adduction 4/5; knees and ankles 4+/5   PALPATION:   General  fascial restrictions throughout all abdominal quadrants, decreased mobility around scar site in all directions, Lt side of abdomen much more restricted compared to right and firm.                 External Perineal Exam deferred                              Internal Pelvic Floor deferred  Patient confirms identification and approves PT to assess internal pelvic floor and treatment No  PELVIC MMT:   MMT eval     Internal Anal Sphincter   External Anal Sphincter   Puborectalis   Diastasis Recti   (Blank rows = not tested)        TONE: deferred  PROLAPSE: deferred  TODAY'S TREATMENT   11/05/21:  Manual: PT provided abdominal massage on pt in supine. Also scar mobilization at ostomy takedown site (around scar not directly on it).  Single knee to chest 2x30s each Hip IR unilateral 2x30s each Standing forward hip hinge 2x30s Seated hip flexion blue band 2x10 Seated hip abduction blue band  2x10 Farmer's carry 10# unilaterally for 1000' verbal cues for posture and core activation  PATIENT EDUCATION:  Education details: [id number] Person educated: Patient and  Caregiver friend Education method: Explanation, Demonstration, Tactile cues, Verbal cues, and Handouts Education comprehension: verbalized understanding and returned demonstration   HOME EXERCISE PROGRAM: [id number]  ASSESSMENT:  CLINICAL IMPRESSION: Patient reports he has been feeling better overall, noted less urgency, less frequency, no incontinence episodes and improvement with form/consistency of stools slightly. Pt session focused on abdominal massage for improved peristalsis and carry over for home for improved technique as needed, also scar mobilization provided by pt around ostomy takedown site with fascial restrictions noted in Rt/upward and Lt rotation noted, improved with manual work around scar, not directly on it. Pt denied pain throughout session. Also, stretching and strengthening at hips for improved mobility at posterior pelvic floor and posture, pt educated throughout on HEP updates and given updated HEP. Pt denied questions. Pt would benefit from additional PT to further address deficits.     OBJECTIVE IMPAIRMENTS decreased activity tolerance, decreased balance, decreased cognition, decreased coordination, decreased endurance, decreased mobility, difficulty walking, decreased strength, increased fascial restrictions, impaired flexibility, improper body mechanics, and postural dysfunction.   ACTIVITY LIMITATIONS continence  PARTICIPATION LIMITATIONS: community activity  PERSONAL FACTORS Past/current experiences, Time since onset of injury/illness/exacerbation, and 1 comorbidity: medical history  are also affecting patient's functional outcome.   REHAB POTENTIAL: Good  CLINICAL DECISION MAKING: Stable/uncomplicated  EVALUATION COMPLEXITY: Low   GOALS: Goals reviewed with patient? Yes  SHORT TERM GOALS: Target date: 11/11/2021  Pt to be I with HEP.  Baseline Goal status: INITIAL  2.  Pt will report 6 BMs per day due to improved muscle tone and coordination with BMs.   Baseline:  Goal status: INITIAL  3. Pt to demonstrate improved 5X STS to 18s for decreased fall risk.  Baseline:  Goal status: INITIAL   LONG TERM GOALS: Target date:  01/14/2022    Pt to be I with advanced HEP.  Baseline:  Goal status: INITIAL  2.  Pt to demonstrate at least 4/5 bil hip strength for improved pelvic stability and functional squats without leakage.  Baseline:  Goal status: INITIAL  3.  Pt will report 4 BMs per day  due to improved muscle tone and coordination with BMs.  Baseline:  Goal status: INITIAL  4.  Pt to demonstrate at least 4/5 pelvic floor strength for improved pelvic stability and decreased strain at pelvic floor/ decrease leakage.  Baseline:  Goal status: INITIAL  5.  Pt will report her BMs are complete due to improved bowel habits and evacuation techniques.  Baseline:  Goal status: INITIAL  6.  Pt to demonstrate improved 5X STS to 13s for decreased fall risk.  Baseline:  Goal status: INITIAL  PLAN: PT FREQUENCY: 1x/week  PT DURATION:  8 sessions  PLANNED INTERVENTIONS: Therapeutic exercises, Therapeutic activity, Neuromuscular re-education, Gait training, Patient/Family education, Self Care, Joint mobilization, DME instructions, Dry Needling, Spinal mobilization, Cryotherapy, Moist heat, scar mobilization, Taping, Vasopneumatic device, Biofeedback, and Manual therapy  PLAN FOR NEXT SESSION: hip and core strengthening, review any handouts, pelvic floor coordination/breathing, voiding mechanics    Stacy Gardner, PT, DPT 11/05/2309:42 AM

## 2021-11-10 ENCOUNTER — Encounter: Payer: Medicare Other | Admitting: Physical Therapy

## 2021-11-10 DIAGNOSIS — B351 Tinea unguium: Secondary | ICD-10-CM | POA: Diagnosis not present

## 2021-11-10 DIAGNOSIS — M79676 Pain in unspecified toe(s): Secondary | ICD-10-CM | POA: Diagnosis not present

## 2021-11-16 ENCOUNTER — Ambulatory Visit: Payer: Medicare Other | Attending: Oncology | Admitting: Physical Therapy

## 2021-11-16 DIAGNOSIS — R279 Unspecified lack of coordination: Secondary | ICD-10-CM | POA: Diagnosis not present

## 2021-11-16 DIAGNOSIS — M6281 Muscle weakness (generalized): Secondary | ICD-10-CM | POA: Diagnosis not present

## 2021-11-16 DIAGNOSIS — R293 Abnormal posture: Secondary | ICD-10-CM | POA: Insufficient documentation

## 2021-11-16 NOTE — Therapy (Signed)
OUTPATIENT PHYSICAL THERAPY MALE PELVIC EVALUATION   Patient Name: William Good MRN: [id number] DOB:[birth year], 66 y.o., male Today's Date: 11/16/2021   PT End of Session - 11/16/21 1355     Visit Number 3    Date for PT Re-Evaluation 01/14/22    Authorization Type [employer] medicare    Progress Note Due on Visit 10    PT Start Time 1400    PT Stop Time 1440    PT Time Calculation (min) 40 min    Activity Tolerance Patient tolerated treatment well    Behavior During Therapy WFL for tasks assessed/performed             Past Medical History:  Diagnosis Date   Anxiety    Back pain    Bipolar disorder ([place])    bipolar 1   CKD (chronic kidney disease) stage 2, GFR 60-89 ml/min 09/24/2020   GERD (gastroesophageal reflux disease)    High output ileostomy ([place]) 05/29/2021   Hypertension    Knee pain    Migraine    Rectal cancer ([place])    Type 2 diabetes mellitus with complication, without long-term current use of insulin ([place]) 03/19/2021   no meds   Past Surgical History:  Procedure Laterality Date   BACK SURGERY     ILEO LOOP DIVERSION N/A 09/03/2021   Procedure: OPEN TAKEDOWN OF LOOP ILEOSTOMY;  Surgeon: Michael Boston, MD;  Location: [facility];  Service: General;  Laterality: N/A;   JOINT REPLACEMENT Right    knee   OSTOMY N/A 05/27/2021   Procedure: POSSIBLE OSTOMY;  Surgeon: Michael Boston, MD;  Location: [facility];  Service: General;  Laterality: N/A;   PORT-A-CATH REMOVAL Right 09/03/2021   Procedure: REMOVAL PORT-A-CATH;  Surgeon: Michael Boston, MD;  Location: [facility];  Service: General;  Laterality: Right;   PORTACATH PLACEMENT N/A 10/02/2020   Procedure: PORT-A-CATH PLACEMENT;  Surgeon: Dwan Bolt, MD;  Location: [facility];  Service: General;  Laterality: N/A;   PROCTOSCOPY N/A 05/27/2021   Procedure: RIGID PROCTOSCOPY;  Surgeon: Michael Boston, MD;  Location: [facility];  Service: General;  Laterality: N/A;   XI ROBOTIC ASSISTED LOWER ANTERIOR RESECTION N/A 05/27/2021    Procedure: ROBOTIC LOW ANTERIOR COLON RESECTION, VERY LOW COLOANAL ANASTOMOSIS, DIVERTING ILEOSTOMY, BILATERAL TAP BLOCK,  FIRFLY INJECTION;  Surgeon: Michael Boston, MD;  Location: [facility];  Service: General;  Laterality: N/A;   Patient Active Problem List   Diagnosis Date Noted   Complicated UTI (urinary tract infection) 07/13/2021   Weight loss 07/07/2021   Hematuria 07/03/2021   Hypercoagulable state associated with COVID-19 ([place]) 06/29/2021   Severe protein-calorie malnutrition ([place]) 06/25/2021   Acute renal failure superimposed on stage 3b chronic kidney disease ([place]) 06/24/2021   COVID-19 with comorbid diabetes mellitus ([place]) 06/17/2021   CRP elevated 06/17/2021   Hypertension associated with stage 3a chronic kidney disease due to type 2 diabetes mellitus ([place]) 06/11/2021   Hyperlipidemia associated with type 2 diabetes mellitus ([place]) 06/11/2021   CKD stage 3 due to type 2 diabetes mellitus ([place]) 06/11/2021   CAD (coronary artery disease) 06/11/2021   Hypertrophic cardiomyopathy ([place]) 06/11/2021   Aortic atherosclerosis ([place]) 06/11/2021   Acute blood loss anemia 06/09/2021   Coronary artery calcification 05/28/2021   AKI (acute kidney injury) ([place]) 05/28/2021   Bipolar disorder ([place]) 05/27/2021   Polycystic kidney 05/27/2021   Type 2 diabetes mellitus with other circulatory complications ([place]) 71/69/6789   Tobacco abuse 05/27/2021   Preoperative clearance 03/19/2021   Type 2  diabetes mellitus with complication, without long-term current use of insulin ([place]) 03/19/2021   Gastroesophageal reflux disease 03/17/2021   Hyperlipidemia 03/17/2021   Benign prostatic hyperplasia with lower urinary tract symptoms 03/17/2021   Migraine without aura, not refractory 03/17/2021   Obesity 03/17/2021   Restless legs 03/17/2021   Severe recurrent major depression with psychotic features ([place]) 03/17/2021   Vitamin D deficiency 03/17/2021   Chronic kidney disease, stage 3 unspecified ([place])  09/24/2020   Obstruction of left ureteropelvic junction (UPJ) 09/24/2020   Rectal cancer ([place]) 09/16/2020   Prolapsed internal hemorrhoids, grade 2 09/16/2020   Migraine 09/16/2020   Essential hypertension 01/16/2010    PCP: Aura Dials, MD  REFERRING PROVIDER: Ladell Pier, MD  REFERRING DIAG: C20 (ICD-10-CM) - Rectal cancer [facility])  THERAPY DIAG:  Muscle weakness (generalized)  Abnormal posture  Unspecified lack of coordination  Rationale for Evaluation and Treatment Rehabilitation  ONSET DATE: 09/03/21  SUBJECTIVE:                                                                                                                                                                                           SUBJECTIVE STATEMENT: Pt reports he has had fell twice since last visit due to feet being numb and lost his balance getting out of bed. Pt reports pain in bil feet has been his biggest issue with walking and standing because this increases pain but with numbness he feels like he might fall. Pt also states he has appt with MD next week for this. Bowel movements have been in morning but takes several (8-9) times to empty bowels. Does state the balloon breathing helps some to fully empty after each bowel movement but not completely.   Fluid intake: Yes: 3 glasses of water per day     PAIN:  Are you having pain? Yes NPRS scale: 9/10 Pain location:  in bil feet with numbness and tingling  Pain type: aching Pain description: intermittent   Aggravating factors: urgency or over doing mobility  Relieving factors: rest   PRECAUTIONS: None  WEIGHT BEARING RESTRICTIONS No  FALLS:  Has patient fallen in last 6 months? No  LIVING ENVIRONMENT: Lives with: lives alone Lives in: House/apartment I at baseline but has help nearby, does not drive  OCCUPATION: retired   PLOF: Independent  PATIENT GOALS to have more regular bowel movements  PERTINENT HISTORY:  Rectal  cancer Mass at 6 cm on digital exam/anoscopy 08/25/2020 Colonoscopy 09/05/2020-mid rectal mass-biopsy, adenocarcinoma, no loss of mismatch repair protein expression CT abdomen/pelvis 08/29/2020-small amount of free fluid in the pelvis, mildly enlarged left external iliac node, cystic changes of  the left kidney with hydronephrosis and regions of cortical thinning with transition at the left UPJ suggesting chronic left UPJ obstruction MRI pelvis 09/24/2020-tumor 6 cm from the anal verge, 1.8 cm from the internal anal sphincter, T2, 7 mm lymph node in the left mesorectal sheath, N1 Cycle 1 FOLFOX 10/08/2020 Cycle 2 FOLFOX 10/22/2020 Cycle 3 FOLFOX 11/12/2020 (oxaliplatin dose reduced, white cell growth factor support on day of pump discontinuation) Cycle 4 FOLFOX 11/26/2020, 5-FU bolus and infusion dose reduced secondary to diarrhea, Ziextenzo Cycle 5 FOLFOX 12/10/2020, 5-FU bolus held due to diarrhea, Ziextenzo Cycle 6 FOLFOX 12/24/2020, 5-FU bolus held, Ziextenzo Cycle 7 FOLFOX 01/07/2021, oxaliplatin held due to neuropathy, 5-FU bolus held, Ziextenzo held Cycle 8 FOLFOX 01/21/2021, oxaliplatin held due to neuropathy, 5-FU bolus held, Ziextenzo held Radiation/Xeloda 02/02/2021-03/16/2021 Low anterior resection/diverting loop ileostomy 05/27/2021, no residual carcinoma, 0/19 nodes, negative resection margins, ypT0,ypN0 Ileostomy takedown 09/03/2021 Sexual abuse: No  BOWEL MOVEMENT Pain with bowel movement: No Type of bowel movement:Type (Bristol Stool Scale) 4-5, Frequency 9-10x, and Strain No Fully empty rectum: No Leakage: Yes: small amounts, not getting to the bathroom quick enough Pads: No Fiber supplement: No is taking imodium, Lomotil  URINATION Pain with urination: No Fully empty bladder: Yes:   Stream: Strong Urgency: No Frequency: not more than every 2 hours, not usually at night Leakage:  not  Pads: No  OBJECTIVE:   DIAGNOSTIC FINDINGS:    COGNITION:  Overall cognitive status:  Impaired with chemo     SENSATION:  Light touch: Deficits neuropathy in bil feet since chemo   Proprioception: Appears intact  MUSCLE LENGTH: Bil hamstrings and adductors limited by 50%   FUNCTIONAL TESTS:  Functional  squat: unable to complete without holding on with UE 5X STS 26s  GAIT: Distance walked: 200' Assistive device utilized: None Level of assistance: Modified independence Comments: shuffled steps, decreased bil step height, trunk flexed, decreased arm swing, poor cadence.                POSTURE: rounded shoulders, forward head, and posterior pelvic tilt   LUMBARAROM/PROM  A/PROM A/PROM  eval  Flexion Limited by 75%  Extension Limited by 50%  Right lateral flexion Limited by 75%  Left lateral flexion Limited by 75%  Right rotation Limited by 75%  Left rotation Limited by 75%   (Blank rows = not tested)  LOWER EXTREMITY ROM:  WFL bil   LOWER EXTREMITY MMT: Bil hip abduction 3+/5, flexion 4/5, extension and adduction 4/5; knees and ankles 4+/5   PALPATION:   General  fascial restrictions throughout all abdominal quadrants, decreased mobility around scar site in all directions, Lt side of abdomen much more restricted compared to right and firm.                 External Perineal Exam deferred                              Internal Pelvic Floor deferred  Patient confirms identification and approves PT to assess internal pelvic floor and treatment No  PELVIC MMT:   MMT eval     Internal Anal Sphincter   External Anal Sphincter   Puborectalis   Diastasis Recti   (Blank rows = not tested)        TONE: deferred  PROLAPSE: deferred  TODAY'S TREATMENT  11/16/21  Manual with addaday at bil para spinals, bil glutes and hamstrings for improved tissue mobility and decreased  tension. Scar mobility at Rt scar site as pt continues to have fascial restrictions around scar site Therapeutic exercise: Stretching bil butterfly 2x30s, hamstrings 2x30s,  unilateral happy baby 2x30s all with active assist for improved technique and mobility; single leg to chest 2x30s, open book x5 each side for 10s each   11/05/21:  Manual: PT provided abdominal massage on pt in supine. Also scar mobilization at ostomy takedown site (around scar not directly on it).  Single knee to chest 2x30s each Hip IR unilateral 2x30s each Standing forward hip hinge 2x30s Seated hip flexion blue band 2x10 Seated hip abduction blue band 2x10 Farmer's carry 10# unilaterally for 1000' verbal cues for posture and core activation  PATIENT EDUCATION:  Education details: [id number] Person educated: Patient and Caregiver friend Education method: Consulting civil engineer, Demonstration, Corporate treasurer cues, Verbal cues, and Handouts Education comprehension: verbalized understanding and returned demonstration   HOME EXERCISE PROGRAM: [id number]  ASSESSMENT:  CLINICAL IMPRESSION: Patient reports he hasn't been having a good week, reports his bil numbness on bil feet has gotten worse and he has had 2 falls getting out of bed. Pt reports pain in feet is worse in standing and walking. Today's session focused on manual work at thoracic and lumbar spine for improved mobility and scar tissue mobility manually without pain, stretching at bil spine, hips for improved mobility. Pt educated during stretching to recall voiding mechanics and attempt step stool for bowel movement to further relax pelvic floor. Pt agreed. Pt would benefit from additional PT to further address deficits.     OBJECTIVE IMPAIRMENTS decreased activity tolerance, decreased balance, decreased cognition, decreased coordination, decreased endurance, decreased mobility, difficulty walking, decreased strength, increased fascial restrictions, impaired flexibility, improper body mechanics, and postural dysfunction.   ACTIVITY LIMITATIONS continence  PARTICIPATION LIMITATIONS: community activity  PERSONAL FACTORS Past/current experiences,  Time since onset of injury/illness/exacerbation, and 1 comorbidity: medical history  are also affecting patient's functional outcome.   REHAB POTENTIAL: Good  CLINICAL DECISION MAKING: Stable/uncomplicated  EVALUATION COMPLEXITY: Low   GOALS: Goals reviewed with patient? Yes  SHORT TERM GOALS: Target date: 11/11/2021  Pt to be I with HEP.  Baseline Goal status: on going  2.  Pt will report 6 BMs per day due to improved muscle tone and coordination with BMs.  Baseline:  Goal status: on going, has had less than this but slight increase in the last week   3. Pt to demonstrate improved 5X STS to 18s for decreased fall risk.  Baseline:  Goal status: on going   LONG TERM GOALS: Target date:  01/14/2022    Pt to be I with advanced HEP.  Baseline:  Goal status: INITIAL  2.  Pt to demonstrate at least 4/5 bil hip strength for improved pelvic stability and functional squats without leakage.  Baseline:  Goal status: INITIAL  3.  Pt will report 4 BMs per day  due to improved muscle tone and coordination with BMs.  Baseline:  Goal status: INITIAL  4.  Pt to demonstrate at least 4/5 pelvic floor strength for improved pelvic stability and decreased strain at pelvic floor/ decrease leakage.  Baseline:  Goal status: INITIAL  5.  Pt will report her BMs are complete due to improved bowel habits and evacuation techniques.  Baseline:  Goal status: INITIAL  6.  Pt to demonstrate improved 5X STS to 13s for decreased fall risk.  Baseline:  Goal status: INITIAL  PLAN: PT FREQUENCY: 1x/week  PT DURATION:  8 sessions  PLANNED INTERVENTIONS: Therapeutic exercises,  Therapeutic activity, Neuromuscular re-education, Gait training, Patient/Family education, Self Care, Joint mobilization, DME instructions, Dry Needling, Spinal mobilization, Cryotherapy, Moist heat, scar mobilization, Taping, Vasopneumatic device, Biofeedback, and Manual therapy  PLAN FOR NEXT SESSION: hip and core  strengthening, review any handouts, pelvic floor coordination/breathing, voiding mechanics    Stacy Gardner, PT, DPT 09/11/232:44 PM

## 2021-11-25 ENCOUNTER — Inpatient Hospital Stay: Payer: Medicare Other

## 2021-11-25 ENCOUNTER — Inpatient Hospital Stay: Payer: Medicare Other | Admitting: Nurse Practitioner

## 2021-11-30 DIAGNOSIS — K219 Gastro-esophageal reflux disease without esophagitis: Secondary | ICD-10-CM | POA: Diagnosis not present

## 2021-11-30 DIAGNOSIS — Z23 Encounter for immunization: Secondary | ICD-10-CM | POA: Diagnosis not present

## 2021-11-30 DIAGNOSIS — N1831 Chronic kidney disease, stage 3a: Secondary | ICD-10-CM | POA: Diagnosis not present

## 2021-11-30 DIAGNOSIS — Z0000 Encounter for general adult medical examination without abnormal findings: Secondary | ICD-10-CM | POA: Diagnosis not present

## 2021-12-03 ENCOUNTER — Ambulatory Visit: Payer: Medicare Other | Admitting: Physical Therapy

## 2021-12-03 DIAGNOSIS — M6281 Muscle weakness (generalized): Secondary | ICD-10-CM | POA: Diagnosis not present

## 2021-12-03 DIAGNOSIS — R293 Abnormal posture: Secondary | ICD-10-CM | POA: Diagnosis not present

## 2021-12-03 DIAGNOSIS — R279 Unspecified lack of coordination: Secondary | ICD-10-CM | POA: Diagnosis not present

## 2021-12-03 NOTE — Patient Instructions (Signed)
Types of Fiber  There are two main types of fiber:  insoluble and soluble.  Both of these types can prevent and relieve constipation and diarrhea, although some people find one or the other to be more easily digested.  This handout details information about both types of fiber. recommended 25-35 grams of fiber per day,  average 9-12 grams per meal   key is a balance between soluble and insoluble  Insoluble Fiber        Functions of Insoluble Fiber moves bulk through the intestines  controls and balances the pH (acidity) in the intestines   This type of fiber should be avoided or reduced if you have soft, frequent bowel movements or leakage      Benefits of Insoluble Fiber promotes regular bowel movement and prevents constipation  removes fecal waste through colon in less time  keeps an optimal pH in intestines to prevent microbes from producing cancer substances, therefore preventing colon cancer        Food Sources of Insoluble Fiber whole-wheat products  wheat bran "miller's bran" corn bran  flax seed or other seeds vegetables such as green beans, broccoli, cauliflower and potato skins  fruit skins and root vegetable skins  popcorn brown rice  Soluble Fiber( Types 5,6,7)       Functions of Soluble Fiber  holds water in the colon to bulk and soften the stool prolongs stomach emptying time so that sugar is released and absorbed more slowly  prevent leakage associated with soft, frequent bowel movements.        Benefits of Soluble Fiber lowers total cholesterol and LDL cholesterol (the bad cholesterol) therefore reducing the risk of heart disease  regulates blood sugar for people with diabetes       Food Sources of Soluble Fiber oat/oat bran dried beans and peas  nuts  barley  flax seed or other seeds fruits such as oranges, pears, peaches, and apples  vegetables such as carrots  psyllium husk  Prunes

## 2021-12-03 NOTE — Therapy (Addendum)
OUTPATIENT PHYSICAL THERAPY MALE PELVIC EVALUATION   Patient Name: William Good MRN: [id number] DOB:[birth year], 66 y.o., male Today's Date: 12/03/2021   PT End of Session - 12/03/21 1532     Visit Number 4    Date for PT Re-Evaluation 01/14/22    Authorization Type [employer] medicare    Progress Note Due on Visit 10    PT Start Time 1530    PT Stop Time 1610    PT Time Calculation (min) 40 min    Activity Tolerance Patient tolerated treatment well    Behavior During Therapy WFL for tasks assessed/performed             Past Medical History:  Diagnosis Date   Anxiety    Back pain    Bipolar disorder ([place])    bipolar 1   CKD (chronic kidney disease) stage 2, GFR 60-89 ml/min 09/24/2020   GERD (gastroesophageal reflux disease)    High output ileostomy ([place]) 05/29/2021   Hypertension    Knee pain    Migraine    Rectal cancer ([place])    Type 2 diabetes mellitus with complication, without long-term current use of insulin ([place]) 03/19/2021   no meds   Past Surgical History:  Procedure Laterality Date   BACK SURGERY     ILEO LOOP DIVERSION N/A 09/03/2021   Procedure: OPEN TAKEDOWN OF LOOP ILEOSTOMY;  Surgeon: Michael Boston, MD;  Location: [facility];  Service: General;  Laterality: N/A;   JOINT REPLACEMENT Right    knee   OSTOMY N/A 05/27/2021   Procedure: POSSIBLE OSTOMY;  Surgeon: Michael Boston, MD;  Location: [facility];  Service: General;  Laterality: N/A;   PORT-A-CATH REMOVAL Right 09/03/2021   Procedure: REMOVAL PORT-A-CATH;  Surgeon: Michael Boston, MD;  Location: [facility];  Service: General;  Laterality: Right;   PORTACATH PLACEMENT N/A 10/02/2020   Procedure: PORT-A-CATH PLACEMENT;  Surgeon: Dwan Bolt, MD;  Location: [facility];  Service: General;  Laterality: N/A;   PROCTOSCOPY N/A 05/27/2021   Procedure: RIGID PROCTOSCOPY;  Surgeon: Michael Boston, MD;  Location: [facility];  Service: General;  Laterality: N/A;   XI ROBOTIC ASSISTED LOWER ANTERIOR RESECTION N/A 05/27/2021    Procedure: ROBOTIC LOW ANTERIOR COLON RESECTION, VERY LOW COLOANAL ANASTOMOSIS, DIVERTING ILEOSTOMY, BILATERAL TAP BLOCK,  FIRFLY INJECTION;  Surgeon: Michael Boston, MD;  Location: [facility];  Service: General;  Laterality: N/A;   Patient Active Problem List   Diagnosis Date Noted   Complicated UTI (urinary tract infection) 07/13/2021   Weight loss 07/07/2021   Hematuria 07/03/2021   Hypercoagulable state associated with COVID-19 ([place]) 06/29/2021   Severe protein-calorie malnutrition ([place]) 06/25/2021   Acute renal failure superimposed on stage 3b chronic kidney disease ([place]) 06/24/2021   COVID-19 with comorbid diabetes mellitus ([place]) 06/17/2021   CRP elevated 06/17/2021   Hypertension associated with stage 3a chronic kidney disease due to type 2 diabetes mellitus ([place]) 06/11/2021   Hyperlipidemia associated with type 2 diabetes mellitus ([place]) 06/11/2021   CKD stage 3 due to type 2 diabetes mellitus ([place]) 06/11/2021   CAD (coronary artery disease) 06/11/2021   Hypertrophic cardiomyopathy ([place]) 06/11/2021   Aortic atherosclerosis ([place]) 06/11/2021   Acute blood loss anemia 06/09/2021   Coronary artery calcification 05/28/2021   AKI (acute kidney injury) ([place]) 05/28/2021   Bipolar disorder ([place]) 05/27/2021   Polycystic kidney 05/27/2021   Type 2 diabetes mellitus with other circulatory complications ([place]) 85/04/7739   Tobacco abuse 05/27/2021   Preoperative clearance 03/19/2021   Type 2  diabetes mellitus with complication, without long-term current use of insulin ([place]) 03/19/2021   Gastroesophageal reflux disease 03/17/2021   Hyperlipidemia 03/17/2021   Benign prostatic hyperplasia with lower urinary tract symptoms 03/17/2021   Migraine without aura, not refractory 03/17/2021   Obesity 03/17/2021   Restless legs 03/17/2021   Severe recurrent major depression with psychotic features ([place]) 03/17/2021   Vitamin D deficiency 03/17/2021   Chronic kidney disease, stage 3 unspecified ([place])  09/24/2020   Obstruction of left ureteropelvic junction (UPJ) 09/24/2020   Rectal cancer ([place]) 09/16/2020   Prolapsed internal hemorrhoids, grade 2 09/16/2020   Migraine 09/16/2020   Essential hypertension 01/16/2010    PCP: Aura Dials, MD  REFERRING PROVIDER: Ladell Pier, MD  REFERRING DIAG: C20 (ICD-10-CM) - Rectal cancer [facility])  THERAPY DIAG:  Muscle weakness (generalized)  Abnormal posture  Unspecified lack of coordination  Rationale for Evaluation and Treatment Rehabilitation  ONSET DATE: 09/03/21  SUBJECTIVE:                                                                                                                                                                                           SUBJECTIVE STATEMENT: Pt reports he feels like his bowel movements are only in the morning and still having leakage with more liquidy bowel movements. First 1-2 bms in the morning at type 4 then progresses to type 7. Then gas throughout the day.  Pt did state he felt after last session with massage at stomach his bowels felt better for the next couple days. They felt larger and I didn't have leakage.   Fluid intake: Yes: 3 glasses of water per day     PAIN:  Are you having pain? Yes NPRS scale: 8/10 Pain location:  in bil feet with numbness and tingling  Pain type: aching Pain description: intermittent   Aggravating factors: urgency or over doing mobility  Relieving factors: rest   PRECAUTIONS: None  WEIGHT BEARING RESTRICTIONS No  FALLS:  Has patient fallen in last 6 months? No  LIVING ENVIRONMENT: Lives with: lives alone Lives in: House/apartment I at baseline but has help nearby, does not drive  OCCUPATION: retired   PLOF: Independent  PATIENT GOALS to have more regular bowel movements  PERTINENT HISTORY:  Rectal cancer Mass at 6 cm on digital exam/anoscopy 08/25/2020 Colonoscopy 09/05/2020-mid rectal mass-biopsy, adenocarcinoma, no loss of mismatch  repair protein expression CT abdomen/pelvis 08/29/2020-small amount of free fluid in the pelvis, mildly enlarged left external iliac node, cystic changes of the left kidney with hydronephrosis and regions of cortical thinning with transition at the left UPJ suggesting chronic left UPJ obstruction MRI pelvis  09/24/2020-tumor 6 cm from the anal verge, 1.8 cm from the internal anal sphincter, T2, 7 mm lymph node in the left mesorectal sheath, N1 Cycle 1 FOLFOX 10/08/2020 Cycle 2 FOLFOX 10/22/2020 Cycle 3 FOLFOX 11/12/2020 (oxaliplatin dose reduced, white cell growth factor support on day of pump discontinuation) Cycle 4 FOLFOX 11/26/2020, 5-FU bolus and infusion dose reduced secondary to diarrhea, Ziextenzo Cycle 5 FOLFOX 12/10/2020, 5-FU bolus held due to diarrhea, Ziextenzo Cycle 6 FOLFOX 12/24/2020, 5-FU bolus held, Ziextenzo Cycle 7 FOLFOX 01/07/2021, oxaliplatin held due to neuropathy, 5-FU bolus held, Ziextenzo held Cycle 8 FOLFOX 01/21/2021, oxaliplatin held due to neuropathy, 5-FU bolus held, Ziextenzo held Radiation/Xeloda 02/02/2021-03/16/2021 Low anterior resection/diverting loop ileostomy 05/27/2021, no residual carcinoma, 0/19 nodes, negative resection margins, ypT0,ypN0 Ileostomy takedown 09/03/2021 Sexual abuse: No  BOWEL MOVEMENT Pain with bowel movement: No Type of bowel movement:Type (Bristol Stool Scale) 4-5, Frequency 9-10x, and Strain No Fully empty rectum: No Leakage: Yes: small amounts, not getting to the bathroom quick enough Pads: No Fiber supplement: No is taking imodium, Lomotil  URINATION Pain with urination: No Fully empty bladder: Yes:   Stream: Strong Urgency: No Frequency: not more than every 2 hours, not usually at night Leakage:  not  Pads: No  OBJECTIVE:   DIAGNOSTIC FINDINGS:    COGNITION:  Overall cognitive status: Impaired with chemo     SENSATION:  Light touch: Deficits neuropathy in bil feet since chemo   Proprioception: Appears intact  MUSCLE  LENGTH: Bil hamstrings and adductors limited by 50%   FUNCTIONAL TESTS:  Functional  squat: unable to complete without holding on with UE 5X STS 26s  GAIT: Distance walked: 200' Assistive device utilized: None Level of assistance: Modified independence Comments: shuffled steps, decreased bil step height, trunk flexed, decreased arm swing, poor cadence.                POSTURE: rounded shoulders, forward head, and posterior pelvic tilt   LUMBARAROM/PROM  A/PROM A/PROM  eval  Flexion Limited by 75%  Extension Limited by 50%  Right lateral flexion Limited by 75%  Left lateral flexion Limited by 75%  Right rotation Limited by 75%  Left rotation Limited by 75%   (Blank rows = not tested)  LOWER EXTREMITY ROM:  WFL bil   LOWER EXTREMITY MMT: Bil hip abduction 3+/5, flexion 4/5, extension and adduction 4/5; knees and ankles 4+/5   PALPATION:   General  fascial restrictions throughout all abdominal quadrants, decreased mobility around scar site in all directions, Lt side of abdomen much more restricted compared to right and firm.                 External Perineal Exam deferred                              Internal Pelvic Floor deferred  Patient confirms identification and approves PT to assess internal pelvic floor and treatment No  PELVIC MMT:   MMT eval     Internal Anal Sphincter   External Anal Sphincter   Puborectalis   Diastasis Recti   (Blank rows = not tested)        TONE: deferred  PROLAPSE: deferred  TODAY'S TREATMENT   12/03/2021:  Manual with addaday at abdominal massage x10,  "m" method x5, ILU method x5 Stretching: hip flexor stretching 3x30s each, lower trunk rotations x10, seated happy baby 3x30s with pt bending forward and resting elbows at knees as he  is unable to fully reach down to lower ankles.  11/16/21  Manual with addaday at bil para spinals, bil glutes and hamstrings for improved tissue mobility and decreased tension. Scar mobility  at Rt scar site as pt continues to have fascial restrictions around scar site Therapeutic exercise: Stretching bil butterfly 2x30s, hamstrings 2x30s, unilateral happy baby 2x30s all with active assist for improved technique and mobility; single leg to chest 2x30s, open book x5 each side for 10s each   11/05/21:  Manual: PT provided abdominal massage on pt in supine. Also scar mobilization at ostomy takedown site (around scar not directly on it).  Single knee to chest 2x30s each Hip IR unilateral 2x30s each Standing forward hip hinge 2x30s Seated hip flexion blue band 2x10 Seated hip abduction blue band 2x10 Farmer's carry 10# unilaterally for 1000' verbal cues for posture and core activation  PATIENT EDUCATION:  Education details: [id number] Person educated: Patient and Caregiver friend Education method: Consulting civil engineer, Demonstration, Corporate treasurer cues, Verbal cues, and Handouts Education comprehension: verbalized understanding and returned demonstration   HOME EXERCISE PROGRAM: [id number]  ASSESSMENT:  CLINICAL IMPRESSION: Patient reports his symptoms are about the same as last session but did not after manual abdominal work felt his bowel movements were more regular for 1 or 2 days after and did not have leakage during those days. Pt session focused on manual work at abdomen and stretching for hip and low mobility to improve tissue mobility at posterior pelvic floor. Pt also had questions about his feet hurting/tingling and if he could take another medication to help his bowels. PT instructed pt to ask doctor about these for medication management as PT can't make recommendations about medication. Pt agreed and reported he would call the doctor tomorrow. Pt would benefit from additional PT to further address deficits.     OBJECTIVE IMPAIRMENTS decreased activity tolerance, decreased balance, decreased cognition, decreased coordination, decreased endurance, decreased mobility, difficulty walking,  decreased strength, increased fascial restrictions, impaired flexibility, improper body mechanics, and postural dysfunction.   ACTIVITY LIMITATIONS continence  PARTICIPATION LIMITATIONS: community activity  PERSONAL FACTORS Past/current experiences, Time since onset of injury/illness/exacerbation, and 1 comorbidity: medical history  are also affecting patient's functional outcome.   REHAB POTENTIAL: Good  CLINICAL DECISION MAKING: Stable/uncomplicated  EVALUATION COMPLEXITY: Low   GOALS: Goals reviewed with patient? Yes  SHORT TERM GOALS: Target date: 11/11/2021  Pt to be I with HEP.  Baseline Goal status: on going  2.  Pt will report 6 BMs per day due to improved muscle tone and coordination with BMs.  Baseline:  Goal status: on going, has had less than this but slight increase in the last week   3. Pt to demonstrate improved 5X STS to 18s for decreased fall risk.  Baseline:  Goal status: on going   LONG TERM GOALS: Target date:  01/14/2022    Pt to be I with advanced HEP.  Baseline:  Goal status: INITIAL  2.  Pt to demonstrate at least 4/5 bil hip strength for improved pelvic stability and functional squats without leakage.  Baseline:  Goal status: INITIAL  3.  Pt will report 4 BMs per day  due to improved muscle tone and coordination with BMs.  Baseline:  Goal status: INITIAL  4.  Pt to demonstrate at least 4/5 pelvic floor strength for improved pelvic stability and decreased strain at pelvic floor/ decrease leakage.  Baseline:  Goal status: INITIAL  5.  Pt will report her BMs are complete due to improved bowel  habits and evacuation techniques.  Baseline:  Goal status: INITIAL  6.  Pt to demonstrate improved 5X STS to 13s for decreased fall risk.  Baseline:  Goal status: INITIAL  PLAN: PT FREQUENCY: 1x/week  PT DURATION:  8 sessions  PLANNED INTERVENTIONS: Therapeutic exercises, Therapeutic activity, Neuromuscular re-education, Gait training,  Patient/Family education, Self Care, Joint mobilization, DME instructions, Dry Needling, Spinal mobilization, Cryotherapy, Moist heat, scar mobilization, Taping, Vasopneumatic device, Biofeedback, and Manual therapy  PLAN FOR NEXT SESSION: hip and core strengthening, review any handouts, pelvic floor coordination/breathing, voiding mechanics    Stacy Gardner, PT, DPT 09/28/235:08 PM   PHYSICAL THERAPY DISCHARGE SUMMARY  Visits from Start of Care: 4  Current functional level related to goals / functional outcomes: Unable to formally reassess as pt called to be Dc'd from PT, no reason given.    Remaining deficits: Unable to formally reassess. Pt has been limited with progress due to reported pain in bil feet status post cancer treatments and this has limited his mobility tolerance. Pt also needs extra time for carry over of new information, reporting his memory has been decreased since cancer treatments as well.    Education / Equipment: HEP   Patient agrees to discharge. Patient goals were partially met. Patient is being discharged due to the patient's request. Thank you for the referral.    Stacy Gardner, PT, DPT 12/21/2308:24 AM

## 2021-12-07 ENCOUNTER — Ambulatory Visit: Payer: Medicare Other | Attending: Oncology | Admitting: Physical Therapy

## 2021-12-07 DIAGNOSIS — M6281 Muscle weakness (generalized): Secondary | ICD-10-CM | POA: Insufficient documentation

## 2021-12-07 DIAGNOSIS — R293 Abnormal posture: Secondary | ICD-10-CM | POA: Insufficient documentation

## 2021-12-07 DIAGNOSIS — R279 Unspecified lack of coordination: Secondary | ICD-10-CM | POA: Insufficient documentation

## 2021-12-10 ENCOUNTER — Other Ambulatory Visit: Payer: Self-pay | Admitting: Adult Health

## 2021-12-13 NOTE — Progress Notes (Deleted)
Cardiology Office Note   Date:  12/13/2021   ID:  Krystian, Younglove 1955/11/22, MRN [id number]  PCP:  Aura Dials, MD  Cardiologist:   None Referring:  Aura Dials, MD  No chief complaint on file.     History of Present Illness: William Good is a 66 y.o. male who presents for preop clearance.  He saw Dr. Aundra Dubin years ago chest pain.  He had a negative perfusion study.  He has rectal cancer and is in need of resection.  He has had treatment with FOLFOX and radiation.  [name]  He was sent to [place] because of his significant risk factors.  He is to have surgical resection of this.     The patient has a markedly abnormal EKG.  Went back and looked at 2011 and this was noted there as well.  This was the indication for the perfusion study.  At that time he did have an echocardiogram I note that he had moderate left ventricular hypertrophy with possibly asymmetric septal hypertrophy and a well-preserved ejection fraction.  There were no significant valvular abnormalities.   This was clearly evident on echo in January.   At that time I saw him prior to bowel surgery.  He since has had resection of rectal cancer and treatment iwht FOLFOX.  ***  ***  The patient has a no history of chest pain or SOB.   He is able to walk 125 yards routinely to the mailbox.  He lives by himself and his brother lives across the street and he was to see him.  He does not have any chest pressure, neck or arm discomfort.  He denies any shortness of breath, PND or orthopnea.  He has no palpitations, presyncope or syncope.  He is actually had significant weight loss of about 25 pounds since starting therapy.   Past Medical History:  Diagnosis Date   Anxiety    Back pain    Bipolar disorder ([place])    bipolar 1   CKD (chronic kidney disease) stage 2, GFR 60-89 ml/min 09/24/2020   GERD (gastroesophageal reflux disease)    High output ileostomy ([place]) 05/29/2021   Hypertension    Knee pain    Migraine     Rectal cancer ([place])    Type 2 diabetes mellitus with complication, without long-term current use of insulin ([place]) 03/19/2021   no meds    Past Surgical History:  Procedure Laterality Date   BACK SURGERY     ILEO LOOP DIVERSION N/A 09/03/2021   Procedure: OPEN TAKEDOWN OF LOOP ILEOSTOMY;  Surgeon: Michael Boston, MD;  Location: [facility];  Service: General;  Laterality: N/A;   JOINT REPLACEMENT Right    knee   OSTOMY N/A 05/27/2021   Procedure: POSSIBLE OSTOMY;  Surgeon: Michael Boston, MD;  Location: [facility];  Service: General;  Laterality: N/A;   PORT-A-CATH REMOVAL Right 09/03/2021   Procedure: REMOVAL PORT-A-CATH;  Surgeon: Michael Boston, MD;  [facility]: [facility];  Service: General;  Laterality: Right;   PORTACATH PLACEMENT N/A 10/02/2020   Procedure: PORT-A-CATH PLACEMENT;  Surgeon: Dwan Bolt, MD;  [facility]: [facility];  Service: General;  Laterality: N/A;   PROCTOSCOPY N/A 05/27/2021   Procedure: RIGID PROCTOSCOPY;  Surgeon: Michael Boston, MD;  [facility]: [facility];  Service: General;  Laterality: N/A;   XI ROBOTIC ASSISTED LOWER ANTERIOR RESECTION N/A 05/27/2021   Procedure: ROBOTIC LOW ANTERIOR COLON RESECTION, VERY LOW COLOANAL ANASTOMOSIS, DIVERTING ILEOSTOMY, BILATERAL TAP BLOCK,  FIRFLY INJECTION;  Surgeon: Michael Boston, MD;  [facility]: [facility];  Service: General;  Laterality: N/A;     Current Outpatient Medications  Medication Sig Dispense Refill   acetaminophen (TYLENOL) 325 MG tablet Take 650 mg by mouth every 6 (six) hours as needed for mild pain. (Patient not taking: Reported on 10/13/2021)     albuterol (VENTOLIN HFA) 108 (90 Base) MCG/ACT inhaler Inhale 2 puffs into the lungs every 6 (six) hours as needed for wheezing or shortness of breath. 8 g 0   ALPRAZolam (XANAX) 1 MG tablet Take 1 tablet (1 mg total) by mouth 2 (two) times daily. Every morning as needed for anxiety, nerves, and behavior 60 tablet 0   aspirin EC 81 MG tablet Take 81 mg by mouth daily. Swallow whole.      Cholecalciferol (VITAMIN D3) 50 MCG (2000 UT) capsule Take 2,000 Units by mouth daily.     ferrous sulfate 325 (65 FE) MG EC tablet Take by mouth.     HYDROcodone-acetaminophen (NORCO/VICODIN) 5-325 MG tablet Take 1 tablet by mouth every 6 (six) hours as needed.     loperamide (IMODIUM) 2 MG capsule Take 1 capsule (2 mg total) by mouth 2 (two) times daily. 30 capsule 0   magnesium oxide (MAG-OX) 400 (240 Mg) MG tablet Take 1 tablet (400 mg total) by mouth 2 (two) times daily. (Patient taking differently: Take 1 tablet by mouth daily.) 60 tablet 0   melatonin 3 MG TABS tablet Take 3 mg by mouth at bedtime.     mirtazapine (REMERON) 45 MG tablet Take 1 tablet (45 mg total) by mouth at bedtime. 30 tablet 0   NON FORMULARY Diet:Regular Diet with Thin Liquids     Nutritional Supplements (ENSURE ENLIVE PO) Take by mouth. 120 ml with medpass to protect muscle mass and help meet increased proten/energy needs Twice A Day Between Meals     omeprazole (PRILOSEC) 20 MG capsule Take 1 capsule (20 mg total) 2 (two) times daily by mouth. 60 capsule 0   ondansetron (ZOFRAN-ODT) 8 MG disintegrating tablet Take 1 tablet (8 mg total) by mouth every 8 (eight) hours as needed for nausea or vomiting. (Patient not taking: Reported on 10/13/2021) 20 tablet 0   polycarbophil (FIBERCON) 625 MG tablet Take 1 tablet (625 mg total) by mouth 2 (two) times daily. 60 tablet 5   potassium chloride SA (KLOR-CON M) 20 MEQ tablet Take 1 tablet (20 mEq total) by mouth 2 (two) times daily. 60 tablet 0   pravastatin (PRAVACHOL) 40 MG tablet Take 1 tablet (40 mg total) by mouth daily. 30 tablet 0   propranolol (INNOPRAN XL) 80 MG 24 hr capsule Take 2 capsules (160 mg total) by mouth daily. 60 capsule 0   risperiDONE (RISPERDAL) 0.25 MG tablet Take 0.5 tablets (0.125 mg total) by mouth at bedtime. (Patient taking differently: Take 0.25 mg by mouth at bedtime.) 30 tablet 0   SUMAtriptan (IMITREX) 100 MG tablet Take 1 tablet (100 mg total) by  mouth 2 (two) times daily as needed for migraine. 10 tablet 0   tamsulosin (FLOMAX) 0.4 MG CAPS capsule Take 1 capsule (0.4 mg total) by mouth at bedtime. 30 capsule 0   traMADol (ULTRAM) 50 MG tablet Take 1-2 tablets (50-100 mg total) by mouth every 6 (six) hours as needed for moderate pain or severe pain. (Patient not taking: Reported on 10/13/2021) 20 tablet 0   No current facility-administered medications for this visit.    Allergies:   Nsaids  ROS:  Please see the history of present illness.   Otherwise, review of systems are positive for ***.   All other systems are reviewed and negative.    PHYSICAL EXAM: VS:  There were no vitals taken for this visit. , BMI There is no height or weight on file to calculate BMI. GENERAL:  Well appearing NECK:  No jugular venous distention, waveform within normal limits, carotid upstroke brisk and symmetric, no bruits, no thyromegaly LUNGS:  Clear to auscultation bilaterally CHEST:  Unremarkable HEART:  PMI not displaced or sustained,S1 and S2 within normal limits, no S3, no S4, no clicks, no rubs, *** murmurs ABD:  Flat, positive bowel sounds normal in frequency in pitch, no bruits, no rebound, no guarding, no midline pulsatile mass, no hepatomegaly, no splenomegaly EXT:  2 plus pulses throughout, no edema, no cyanosis no clubbing     ***GENERAL:  Well appearing HEENT:  Pupils equal round and reactive, fundi not visualized, oral mucosa unremarkable NECK:  No jugular venous distention, waveform within normal limits, carotid upstroke brisk and symmetric, no bruits, no thyromegaly LYMPHATICS:  No cervical, inguinal adenopathy LUNGS:  Clear to auscultation bilaterally BACK:  No CVA tenderness CHEST:  Unremarkable HEART:  PMI not displaced or sustained,S1 and S2 within normal limits, no S3, no S4, no clicks, no rubs, no murmurs.  He has a very soft systolic murmur ABD:  Flat, positive bowel sounds normal in frequency in pitch, no bruits, no  rebound, no guarding, no midline pulsatile mass, no hepatomegaly, no splenomegaly EXT:  2 plus pulses throughout, no edema, no cyanosis no clubbing SKIN:  No rashes no nodules NEURO:  Cranial nerves II through XII grossly intact, motor grossly intact throughout PSYCH:  Cognitively intact, oriented to person place and time    EKG:  EKG is *** ordered today. The ekg ordered today demonstrates sinus rhythm, rate ***, axis within normal limits, QTC ***, left ventricular hypertrophy with significant repolarization changes but no change from previous.   Recent Labs: 09/04/2021: Platelets 204 09/07/2021: Hemoglobin 9.5 10/13/2021: ALT 6; BUN 13; Creatinine 1.55; Magnesium 1.8; Potassium 4.4; Sodium 140    Lipid Panel No results found for: "CHOL", "TRIG", "HDL", "CHOLHDL", "VLDL", "LDLCALC", "LDLDIRECT"    Wt Readings from Last 3 Encounters:  10/13/21 201 lb 9.6 oz (91.4 kg)  09/15/21 200 lb 9.6 oz (91 kg)  09/07/21 204 lb 12.9 oz (92.9 kg)      Other studies Reviewed: Additional studies/ records that were reviewed today include: *** Review of the above records demonstrates:  Please see elsewhere in the note.     ASSESSMENT AND PLAN:  HTN: His blood pressure is *** well controlled.  No change in therapy.  DM: A1c is only 5.8.  SEPTAL HYPERTROPHY :  ***   He has a longstanding markedly abnormal EKG.  He likely has septal hypertrophy and hypertrophic cardiomyopathy.  ***   I am going to repeat an echocardiogram.  Eventually he will need an MRI and other risk stratification.  Ultimately he may need screening for his children.  He has 2 sons.  We began this conversation.   Current medicines are reviewed at length with the patient today.  The patient does not have concerns regarding medicines.  The following changes have been made:  ***  Labs/ tests ordered today include: ***  No orders of the defined types were placed in this encounter.    Disposition:   FU with ***      Signed, Jeneen Rinks  Brianny Soulliere, MD  12/13/2021 9:29 PM    [place]

## 2021-12-14 ENCOUNTER — Ambulatory Visit: Payer: Medicare Other | Admitting: Physical Therapy

## 2021-12-14 ENCOUNTER — Ambulatory Visit: Payer: Medicare Other | Admitting: Cardiology

## 2021-12-14 DIAGNOSIS — E118 Type 2 diabetes mellitus with unspecified complications: Secondary | ICD-10-CM

## 2021-12-14 DIAGNOSIS — I422 Other hypertrophic cardiomyopathy: Secondary | ICD-10-CM

## 2021-12-14 DIAGNOSIS — I10 Essential (primary) hypertension: Secondary | ICD-10-CM

## 2021-12-18 DIAGNOSIS — N130 Hydronephrosis with ureteropelvic junction obstruction: Secondary | ICD-10-CM | POA: Diagnosis not present

## 2021-12-18 DIAGNOSIS — R338 Other retention of urine: Secondary | ICD-10-CM | POA: Diagnosis not present

## 2021-12-23 ENCOUNTER — Ambulatory Visit: Payer: Medicare Other | Admitting: Physical Therapy

## 2021-12-30 ENCOUNTER — Encounter: Payer: Medicare Other | Admitting: Physical Therapy

## 2022-01-02 NOTE — Progress Notes (Unsigned)
Cardiology Office Note   Date:  01/04/2022   ID:  William, Good 1955-12-29, MRN [id number]  PCP:  Aura Dials, MD  Cardiologist:   None Referring:  Aura Dials, MD  Chief Complaint  Patient presents with   Cardiomyopathy      History of Present Illness: William Good is a 66 y.o. male who presents for preop clearance.  He saw Dr. Aundra Dubin years ago chest pain.  He had a negative perfusion study.  He has rectal cancer and is in need of resection.  He has had treatment with FOLFOX and radiation.  [name]  He was sent to [place] because of his significant risk factors.  He is to have surgical resection of this.     The patient has a markedly abnormal EKG.  Went back and looked at 2011 and this was noted there as well.  This was the indication for the perfusion study.  At that time he did have an echocardiogram I note that he had moderate left ventricular hypertrophy with possibly asymmetric septal hypertrophy and a well-preserved ejection fraction.  There were no significant valvular abnormalities.   This was clearly evident on echo in January 2023.   At that time I saw him prior to bowel surgery.  He since has had resection of rectal cancer and treatment iwht FOLFOX.   He has since had reanastomosis and has pleated chemotherapy.  He has done quite well.  He denies any cardiovascular symptoms.  He walks across the street to his brother who lives there.  The patient denies any new symptoms such as chest discomfort, neck or arm discomfort. There has been no new shortness of breath, PND or orthopnea. There have been no reported palpitations, presyncope or syncope.  He might have some rapid heart rate when he tries to walk a mile but this is at the end of the walk..   Past Medical History:  Diagnosis Date   Anxiety    Back pain    Bipolar disorder ([place])    bipolar 1   CKD (chronic kidney disease) stage 2, GFR 60-89 ml/min 09/24/2020   GERD (gastroesophageal reflux disease)     High output ileostomy ([place]) 05/29/2021   Hypertension    Knee pain    Migraine    Rectal cancer ([place])    Type 2 diabetes mellitus with complication, without long-term current use of insulin ([place]) 03/19/2021   no meds    Past Surgical History:  Procedure Laterality Date   BACK SURGERY     ILEO LOOP DIVERSION N/A 09/03/2021   Procedure: OPEN TAKEDOWN OF LOOP ILEOSTOMY;  Surgeon: Michael Boston, MD;  [facility];  Service: General;  Laterality: N/A;   JOINT REPLACEMENT Right    knee   OSTOMY N/A 05/27/2021   Procedure: POSSIBLE OSTOMY;  Surgeon: Michael Boston, MD;  [facility];  Service: General;  Laterality: N/A;   PORT-A-CATH REMOVAL Right 09/03/2021   Procedure: REMOVAL PORT-A-CATH;  Surgeon: Michael Boston, MD;  [facility];  Service: General;  Laterality: Right;   PORTACATH PLACEMENT N/A 10/02/2020   Procedure: PORT-A-CATH PLACEMENT;  Surgeon: Dwan Bolt, MD;  [facility];  Service: General;  Laterality: N/A;   PROCTOSCOPY N/A 05/27/2021   Procedure: RIGID PROCTOSCOPY;  Surgeon: Michael Boston, MD;  [facility];  Service: General;  Laterality: N/A;   XI ROBOTIC ASSISTED LOWER ANTERIOR RESECTION N/A 05/27/2021   Procedure: ROBOTIC LOW ANTERIOR COLON RESECTION, VERY LOW COLOANAL ANASTOMOSIS,  DIVERTING ILEOSTOMY, BILATERAL TAP BLOCK,  FIRFLY INJECTION;  Surgeon: Michael Boston, MD;  [facility];  Service: General;  Laterality: N/A;     Current Outpatient Medications  Medication Sig Dispense Refill   acetaminophen (TYLENOL) 325 MG tablet Take 650 mg by mouth every 6 (six) hours as needed for mild pain.     albuterol (VENTOLIN HFA) 108 (90 Base) MCG/ACT inhaler Inhale 2 puffs into the lungs every 6 (six) hours as needed for wheezing or shortness of breath. 8 g 0   ALPRAZolam (XANAX) 1 MG tablet Take 1 tablet (1 mg total) by mouth 2 (two) times daily. Every morning as needed for anxiety, nerves, and behavior 60 tablet 0   aspirin EC 81 MG tablet Take 81 mg by  mouth daily. Swallow whole.     Cholecalciferol (VITAMIN D3) 50 MCG (2000 UT) capsule Take 2,000 Units by mouth daily.     ferrous sulfate 325 (65 FE) MG EC tablet Take by mouth.     magnesium oxide (MAG-OX) 400 (240 Mg) MG tablet Take 1 tablet (400 mg total) by mouth 2 (two) times daily. (Patient taking differently: Take 1 tablet by mouth daily.) 60 tablet 0   melatonin 3 MG TABS tablet Take 3 mg by mouth at bedtime.     mirtazapine (REMERON) 45 MG tablet Take 1 tablet (45 mg total) by mouth at bedtime. 30 tablet 0   NON FORMULARY Diet:Regular Diet with Thin Liquids     omeprazole (PRILOSEC) 20 MG capsule Take 1 capsule (20 mg total) 2 (two) times daily by mouth. 60 capsule 0   polycarbophil (FIBERCON) 625 MG tablet Take 1 tablet (625 mg total) by mouth 2 (two) times daily. 60 tablet 5   potassium chloride SA (KLOR-CON M) 20 MEQ tablet Take 1 tablet (20 mEq total) by mouth 2 (two) times daily. 60 tablet 0   pravastatin (PRAVACHOL) 40 MG tablet Take 1 tablet (40 mg total) by mouth daily. 30 tablet 0   propranolol (INNOPRAN XL) 80 MG 24 hr capsule Take 2 capsules (160 mg total) by mouth daily. 60 capsule 0   SUMAtriptan (IMITREX) 100 MG tablet Take 1 tablet (100 mg total) by mouth 2 (two) times daily as needed for migraine. 10 tablet 0   tamsulosin (FLOMAX) 0.4 MG CAPS capsule Take 1 capsule (0.4 mg total) by mouth at bedtime. 30 capsule 0   HYDROcodone-acetaminophen (NORCO/VICODIN) 5-325 MG tablet Take 1 tablet by mouth every 6 (six) hours as needed. (Patient not taking: Reported on 01/04/2022)     loperamide (IMODIUM) 2 MG capsule Take 1 capsule (2 mg total) by mouth 2 (two) times daily. (Patient not taking: Reported on 01/04/2022) 30 capsule 0   Nutritional Supplements (ENSURE ENLIVE PO) Take by mouth. 120 ml with medpass to protect muscle mass and help meet increased proten/energy needs Twice A Day Between Meals (Patient not taking: Reported on 01/04/2022)     ondansetron (ZOFRAN-ODT) 8 MG  disintegrating tablet Take 1 tablet (8 mg total) by mouth every 8 (eight) hours as needed for nausea or vomiting. (Patient not taking: Reported on 10/13/2021) 20 tablet 0   risperiDONE (RISPERDAL) 0.25 MG tablet Take 0.5 tablets (0.125 mg total) by mouth at bedtime. (Patient not taking: Reported on 01/04/2022) 30 tablet 0   traMADol (ULTRAM) 50 MG tablet Take 1-2 tablets (50-100 mg total) by mouth every 6 (six) hours as needed for moderate pain or severe pain. (Patient not taking: Reported on 10/13/2021) 20 tablet 0  No current facility-administered medications for this visit.    Allergies:   Nsaids    ROS:  Please see the history of present illness.   Otherwise, review of systems are positive for none.   All other systems are reviewed and negative.    PHYSICAL EXAM: VS:  BP 122/84   Pulse 62   Ht 6' (1.829 m)   Wt 241 lb (109.3 kg)   SpO2 94%   BMI 32.69 kg/m  , BMI Body mass index is 32.69 kg/m. GENERAL:  Well appearing NECK:  No jugular venous distention, waveform within normal limits, carotid upstroke brisk and symmetric, no bruits, no thyromegaly LUNGS:  Clear to auscultation bilaterally CHEST:  Unremarkable HEART:  PMI not displaced or sustained,S1 and S2 within normal limits, no S3, no S4, no clicks, no rubs, no murmurs ABD:  Flat, positive bowel sounds normal in frequency in pitch, no bruits, no rebound, no guarding, no midline pulsatile mass, no hepatomegaly, no splenomegaly EXT:  2 plus pulses throughout, no edema, no cyanosis no clubbing   EKG:  EKG is  ordered today. The ekg ordered today demonstrates sinus rhythm, rate 62, axis within normal limits, QTC prolonged, left ventricular hypertrophy with significant repolarization changes but no change from previous.  No change from previous   Recent Labs: 09/04/2021: Platelets 204 09/07/2021: Hemoglobin 9.5 10/13/2021: ALT 6; BUN 13; Creatinine 1.55; Magnesium 1.8; Potassium 4.4; Sodium 140    Lipid Panel No results found  for: "CHOL", "TRIG", "HDL", "CHOLHDL", "VLDL", "LDLCALC", "LDLDIRECT"    Wt Readings from Last 3 Encounters:  01/04/22 241 lb (109.3 kg)  10/13/21 201 lb 9.6 oz (91.4 kg)  09/15/21 200 lb 9.6 oz (91 kg)      Other studies Reviewed: Additional studies/ records that were reviewed today include: None Review of the above records demonstrates: NA   ASSESSMENT AND PLAN:  HTN: His blood pressure is at target.  No change in therapy.   DM: A1c is only 5.8.  ASYMMETRIC APICAL HYPERTROPHY : He is going to have cardiac MRI to look for high risk features for sudden cardiac death.  He will also get a 3-day Zio patch.  He is not having any symptoms.  Pending the results of this I will send him for genetic testing.  He was there with his brother-in-law today.  I talked with him about autosomal dominant transmission.  We talked about the need for screening for family members.  He has 2 children.     Current medicines are reviewed at length with the patient today.  The patient does not have concerns regarding medicines.  The following changes have been made:  None  Labs/ tests ordered today include: None  Orders Placed This Encounter  Procedures   MR CARDIAC MORPHOLOGY W WO CONTRAST   CBC   LONG TERM MONITOR (3-14 DAYS)   EKG 12-Lead     Disposition:   FU with APP in six months.       Signed, Minus Breeding, MD  01/04/2022 12:14 PM    [facility]

## 2022-01-04 ENCOUNTER — Ambulatory Visit (INDEPENDENT_AMBULATORY_CARE_PROVIDER_SITE_OTHER): Payer: Medicare Other

## 2022-01-04 ENCOUNTER — Ambulatory Visit: Payer: Medicare Other | Attending: Cardiology | Admitting: Cardiology

## 2022-01-04 ENCOUNTER — Encounter: Payer: Self-pay | Admitting: Cardiology

## 2022-01-04 ENCOUNTER — Other Ambulatory Visit: Payer: Self-pay | Admitting: Cardiology

## 2022-01-04 VITALS — BP 122/84 | HR 62 | Ht 72.0 in | Wt 241.0 lb

## 2022-01-04 DIAGNOSIS — Z01812 Encounter for preprocedural laboratory examination: Secondary | ICD-10-CM

## 2022-01-04 DIAGNOSIS — R000 Tachycardia, unspecified: Secondary | ICD-10-CM

## 2022-01-04 DIAGNOSIS — I422 Other hypertrophic cardiomyopathy: Secondary | ICD-10-CM

## 2022-01-04 DIAGNOSIS — I421 Obstructive hypertrophic cardiomyopathy: Secondary | ICD-10-CM

## 2022-01-04 DIAGNOSIS — I440 Atrioventricular block, first degree: Secondary | ICD-10-CM

## 2022-01-04 DIAGNOSIS — E1159 Type 2 diabetes mellitus with other circulatory complications: Secondary | ICD-10-CM | POA: Diagnosis not present

## 2022-01-04 NOTE — Progress Notes (Unsigned)
Enrolled for [employer] to mail a ZIO XT long term holter monitor to the patients address on file.  

## 2022-01-04 NOTE — Patient Instructions (Addendum)
Medication Instructions:  Your physician recommends that you continue on your current medications as directed. Please refer to the Current Medication list given to you today.  *If you need a refill on your cardiac medications before your next appointment, please call your pharmacy*   Lab Work: Your physician recommends that you return 1 week prior to your MRI to have the following lab drawn: CBC  If you have labs (blood work) drawn today and your tests are completely normal, you will receive your results only by: [place] (if you have MyChart) OR A paper copy in the mail If you have any lab test that is abnormal or we need to change your treatment, we will call you to review the results.   Testing/Procedures: [place]- Long Term Monitor Instructions  Your physician has requested you wear a ZIO patch monitor for 3 days.  This is a single patch monitor. [employer] supplies one patch monitor per enrollment. Additional stickers are not available. Please do not apply patch if you will be having a Nuclear Stress Test,  Echocardiogram, Cardiac CT, MRI, or Chest Xray during the period you would be wearing the  monitor. The patch cannot be worn during these tests. You cannot remove and re-apply the  ZIO XT patch monitor.  Your ZIO patch monitor will be mailed 3 day USPS to your address on file. It may take 3-5 days  to receive your monitor after you have been enrolled.  Once you have received your monitor, please review the enclosed instructions. Your monitor  has already been registered assigning a specific monitor serial # to you.  Billing and Patient Assistance Program Information  We have supplied [employer] with any of your insurance information on file for billing purposes. [employer] offers a sliding scale Patient Assistance Program for patients that do not have  insurance, or whose insurance does not completely cover the cost of the ZIO monitor.  You must apply for the Patient Assistance  Program to qualify for this discounted rate.  To apply, please call [employer] at [phone], select option 4, select option 2, ask to apply for  Patient Assistance Program. Theodore Demark will ask your household income, and how many people  are in your household. They will quote your out-of-pocket cost based on that information.  [employer] will also be able to set up a 66-month interest-free payment plan if needed.  Applying the monitor   Shave hair from upper left chest.  Hold abrader disc by orange tab. Rub abrader in 40 strokes over the upper left chest as  indicated in your monitor instructions.  Clean area with 4 enclosed alcohol pads. Let dry.  Apply patch as indicated in monitor instructions. Patch will be placed under collarbone on left  side of chest with arrow pointing upward.  Rub patch adhesive wings for 2 minutes. Remove white label marked "1". Remove the white  label marked "2". Rub patch adhesive wings for 2 additional minutes.  While looking in a mirror, press and release button in center of patch. A small green light will  flash 3-4 times. This will be your only indicator that the monitor has been turned on.  Do not shower for the first 24 hours. You may shower after the first 24 hours.  Press the button if you feel a symptom. You will hear a small click. Record Date, Time and  Symptom in the Patient Logbook.  When you are ready to remove the patch, follow instructions on the last 2 pages  of Patient  Logbook. Stick patch monitor onto the last page of Patient Logbook.  Place Patient Logbook in the blue and white box. Use locking tab on box and tape box closed  securely. The blue and white box has prepaid postage on it. Please place it in the mailbox as  soon as possible. Your physician should have your test results approximately 7 days after the  monitor has been mailed back to [facility].  Call [place] at [phone] if you have questions regarding   your ZIO XT patch monitor. Call them immediately if you see an orange light blinking on your  monitor.  If your monitor falls off in less than 4 days, contact our Monitor department at [phone].  If your monitor becomes loose or falls off after 4 days call [employer] at [phone] for  suggestions on securing your monitor    Your physician has requested that you have a cardiac MRI. Cardiac MRI uses a computer to create images of your heart as its beating, producing both still and moving pictures of your heart and major blood vessels. For further information please visit [email or web address] Please follow the instruction sheet given to you today for more information.    Follow-Up: At [facility], you and your health needs are our priority.  As part of our continuing mission to provide you with exceptional heart care, we have created designated Provider Care Teams.  These Care Teams include your primary Cardiologist (physician) and Advanced Practice Providers (APPs -  Physician Assistants and Nurse Practitioners) who all work together to provide you with the care you need, when you need it.  We recommend signing up for the patient portal called "MyChart".  Sign up information is provided on this After Visit Summary.  MyChart is used to connect with patients for Virtual Visits (Telemedicine).  Patients are able to view lab/test results, encounter notes, upcoming appointments, etc.  Non-urgent messages can be sent to your provider as well.   To learn more about what you can do with MyChart, go to [email or web address].    Your next appointment:   6 month(s)  The format for your next appointment:   In Person  Provider:   Rosaria Ferries, PA-C, Sande Rives, PA-C, Caron Presume, PA-C, or Almyra Deforest, PA-C       Other Instructions   You are scheduled for Cardiac MRI on ______________. Please arrive for your appointment at ______________ ( arrive 30-45 minutes prior to test  start time). ?  [facility] [address] [place], [place] [address] [phone] Please take advantage of the free valet parking available at the MAIN entrance (A entrance).  Proceed to the [facility] Radiology Department (First Floor) for check-in.   [facility] [address] [place], [place] [address] [phone] Please take advantage of the free valet parking available at the MAIN entrance. Proceed to [facility] registration for check-in (first floor).  Magnetic resonance imaging (MRI) is a painless test that produces images of the inside of the body without using Xrays.  During an MRI, strong magnets and radio waves work together in a [employer] officer, political party to form detailed images.   MRI images may provide more details about a medical condition than X-rays, CT scans, and ultrasounds can provide.  You may be given earphones to listen for instructions.  You may eat a light breakfast and take medications as ordered with the exception of HCTZ (fluid pill, other). Please avoid  stimulants for 12 hr prior to test. (Ie. Caffeine, nicotine, chocolate, or antihistamine medications)  If a contrast material will be used, an IV will be inserted into one of your veins. Contrast material will be injected into your IV. It will leave your body through your urine within a day. You may be told to drink plenty of fluids to help flush the contrast material out of your system.  You will be asked to remove all metal, including: Watch, jewelry, and other metal objects including hearing aids, hair pieces and dentures. Also wearable glucose monitoring systems (ie. Freestyle Libre and Omnipods) (Braces and fillings normally are not a problem.)   TEST WILL TAKE APPROXIMATELY 1 HOUR  PLEASE NOTIFY SCHEDULING AT LEAST 24 HOURS IN ADVANCE IF YOU ARE UNABLE TO KEEP YOUR APPOINTMENT. [phone]  Please call Marchia Bond, cardiac imaging nurse navigator with any  questions/concerns. Marchia Bond RN Navigator Cardiac Imaging Gordy Clement RN Navigator Cardiac Imaging [facility] Heart and Vascular Services [phone] Office    Important Information About Sugar

## 2022-01-07 DIAGNOSIS — R000 Tachycardia, unspecified: Secondary | ICD-10-CM | POA: Diagnosis not present

## 2022-01-07 DIAGNOSIS — I422 Other hypertrophic cardiomyopathy: Secondary | ICD-10-CM | POA: Diagnosis not present

## 2022-01-07 DIAGNOSIS — I421 Obstructive hypertrophic cardiomyopathy: Secondary | ICD-10-CM | POA: Diagnosis not present

## 2022-01-07 DIAGNOSIS — I440 Atrioventricular block, first degree: Secondary | ICD-10-CM | POA: Diagnosis not present

## 2022-01-13 ENCOUNTER — Other Ambulatory Visit: Payer: Self-pay | Admitting: *Deleted

## 2022-01-13 DIAGNOSIS — Z01812 Encounter for preprocedural laboratory examination: Secondary | ICD-10-CM | POA: Diagnosis not present

## 2022-01-13 DIAGNOSIS — I422 Other hypertrophic cardiomyopathy: Secondary | ICD-10-CM | POA: Diagnosis not present

## 2022-01-14 LAB — CBC
Hematocrit: 35 % — ABNORMAL LOW (ref 37.5–51.0)
Hemoglobin: 10.6 g/dL — ABNORMAL LOW (ref 13.0–17.7)
MCH: 24.8 pg — ABNORMAL LOW (ref 26.6–33.0)
MCHC: 30.3 g/dL — ABNORMAL LOW (ref 31.5–35.7)
MCV: 82 fL (ref 79–97)
Platelets: 201 10*3/uL (ref 150–450)
RBC: 4.27 x10E6/uL (ref 4.14–5.80)
RDW: 15.5 % — ABNORMAL HIGH (ref 11.6–15.4)
WBC: 5.4 10*3/uL (ref 3.4–10.8)

## 2022-01-14 LAB — BASIC METABOLIC PANEL
BUN/Creatinine Ratio: 8 — ABNORMAL LOW (ref 10–24)
BUN: 14 mg/dL (ref 8–27)
CO2: 27 mmol/L (ref 20–29)
Calcium: 8.7 mg/dL (ref 8.6–10.2)
Chloride: 103 mmol/L (ref 96–106)
Creatinine, Ser: 1.75 mg/dL — ABNORMAL HIGH (ref 0.76–1.27)
Glucose: 114 mg/dL — ABNORMAL HIGH (ref 70–99)
Potassium: 4.3 mmol/L (ref 3.5–5.2)
Sodium: 143 mmol/L (ref 134–144)
eGFR: 42 mL/min/{1.73_m2} — ABNORMAL LOW (ref >59)

## 2022-01-16 DIAGNOSIS — R000 Tachycardia, unspecified: Secondary | ICD-10-CM | POA: Diagnosis not present

## 2022-01-16 DIAGNOSIS — I422 Other hypertrophic cardiomyopathy: Secondary | ICD-10-CM | POA: Diagnosis not present

## 2022-01-19 ENCOUNTER — Telehealth (HOSPITAL_COMMUNITY): Payer: Self-pay | Admitting: *Deleted

## 2022-01-19 NOTE — Telephone Encounter (Signed)
Reaching out to patient's brother to offer assistance regarding upcoming cardiac imaging study; pt's brother verbalizes understanding of appt date/time, parking situation and where to check in, and verified current allergies; name and call back number provided for further questions should they arise  Gordy Clement RN [place] [facility] [phone] office [phone] cell  Patient's brother states he's not claustrophobic and has a knee replacement.

## 2022-01-20 ENCOUNTER — Other Ambulatory Visit: Payer: Self-pay | Admitting: Cardiology

## 2022-01-20 ENCOUNTER — Ambulatory Visit
Admission: RE | Admit: 2022-01-20 | Discharge: 2022-01-20 | Disposition: A | Discharge status: Home / Self Care | Payer: Medicare Other | Source: Ambulatory Visit | Attending: Cardiology | Admitting: Cardiology

## 2022-01-20 DIAGNOSIS — E1159 Type 2 diabetes mellitus with other circulatory complications: Secondary | ICD-10-CM | POA: Diagnosis not present

## 2022-01-20 DIAGNOSIS — E785 Hyperlipidemia, unspecified: Secondary | ICD-10-CM | POA: Diagnosis not present

## 2022-01-20 DIAGNOSIS — I422 Other hypertrophic cardiomyopathy: Secondary | ICD-10-CM | POA: Diagnosis not present

## 2022-01-20 DIAGNOSIS — K219 Gastro-esophageal reflux disease without esophagitis: Secondary | ICD-10-CM | POA: Diagnosis not present

## 2022-01-20 DIAGNOSIS — I10 Essential (primary) hypertension: Secondary | ICD-10-CM | POA: Diagnosis not present

## 2022-01-20 MED ORDER — GADOBUTROL 1 MMOL/ML IV SOLN
14.0000 mL | Freq: Once | INTRAVENOUS | Status: AC | PRN
  Administered 2022-01-20: 14 mL via INTRAVENOUS

## 2022-01-25 ENCOUNTER — Other Ambulatory Visit: Payer: Self-pay | Admitting: *Deleted

## 2022-01-25 DIAGNOSIS — I10 Essential (primary) hypertension: Secondary | ICD-10-CM

## 2022-02-01 ENCOUNTER — Encounter: Payer: Self-pay | Admitting: *Deleted

## 2022-02-08 ENCOUNTER — Encounter: Payer: Self-pay | Admitting: *Deleted

## 2022-02-10 DIAGNOSIS — I10 Essential (primary) hypertension: Secondary | ICD-10-CM | POA: Diagnosis not present

## 2022-02-10 LAB — BASIC METABOLIC PANEL
BUN/Creatinine Ratio: 6 — ABNORMAL LOW (ref 10–24)
BUN: 11 mg/dL (ref 8–27)
CO2: 28 mmol/L (ref 20–29)
Calcium: 9 mg/dL (ref 8.6–10.2)
Chloride: 102 mmol/L (ref 96–106)
Creatinine, Ser: 1.73 mg/dL — ABNORMAL HIGH (ref 0.76–1.27)
Glucose: 98 mg/dL (ref 70–99)
Potassium: 4.4 mmol/L (ref 3.5–5.2)
Sodium: 143 mmol/L (ref 134–144)
eGFR: 43 mL/min/{1.73_m2} — ABNORMAL LOW (ref >59)

## 2022-02-15 ENCOUNTER — Other Ambulatory Visit: Payer: Self-pay | Admitting: *Deleted

## 2022-02-15 DIAGNOSIS — I10 Essential (primary) hypertension: Secondary | ICD-10-CM

## 2022-03-05 ENCOUNTER — Other Ambulatory Visit: Payer: Self-pay | Admitting: Adult Health

## 2022-03-15 DIAGNOSIS — K219 Gastro-esophageal reflux disease without esophagitis: Secondary | ICD-10-CM | POA: Diagnosis not present

## 2022-03-15 DIAGNOSIS — I10 Essential (primary) hypertension: Secondary | ICD-10-CM | POA: Diagnosis not present

## 2022-03-15 DIAGNOSIS — N183 Chronic kidney disease, stage 3 unspecified: Secondary | ICD-10-CM | POA: Diagnosis not present

## 2022-03-15 DIAGNOSIS — E1159 Type 2 diabetes mellitus with other circulatory complications: Secondary | ICD-10-CM | POA: Diagnosis not present

## 2022-03-15 DIAGNOSIS — E785 Hyperlipidemia, unspecified: Secondary | ICD-10-CM | POA: Diagnosis not present

## 2022-04-09 ENCOUNTER — Other Ambulatory Visit (HOSPITAL_COMMUNITY): Payer: Medicare Other

## 2022-04-23 ENCOUNTER — Telehealth: Payer: Self-pay

## 2022-04-23 NOTE — Telephone Encounter (Signed)
Patient brother called and requested an appointment with Dr. Benay Spice because the patient is having difficult urgency and control bowel. I advise the patient brother to call Dr. Johney Maine office. The patient is remission from rectal cancer. Dr. Benay Spice agree the patient needs to reach out to Dr. Johney Maine.

## 2022-05-03 DIAGNOSIS — C20 Malignant neoplasm of rectum: Secondary | ICD-10-CM | POA: Diagnosis not present

## 2022-05-03 DIAGNOSIS — R198 Other specified symptoms and signs involving the digestive system and abdomen: Secondary | ICD-10-CM | POA: Diagnosis not present

## 2022-05-10 ENCOUNTER — Encounter: Payer: Self-pay | Admitting: *Deleted

## 2022-05-28 ENCOUNTER — Encounter: Payer: Self-pay | Admitting: Nurse Practitioner

## 2022-05-28 ENCOUNTER — Inpatient Hospital Stay: Payer: Medicare Other | Attending: Oncology

## 2022-05-28 ENCOUNTER — Inpatient Hospital Stay: Payer: Medicare Other | Admitting: Nurse Practitioner

## 2022-05-28 VITALS — BP 147/92 | HR 75 | Temp 98.1°F | Resp 18 | Ht 72.0 in | Wt 267.0 lb

## 2022-05-28 DIAGNOSIS — E1122 Type 2 diabetes mellitus with diabetic chronic kidney disease: Secondary | ICD-10-CM | POA: Diagnosis not present

## 2022-05-28 DIAGNOSIS — G620 Drug-induced polyneuropathy: Secondary | ICD-10-CM | POA: Insufficient documentation

## 2022-05-28 DIAGNOSIS — C20 Malignant neoplasm of rectum: Secondary | ICD-10-CM

## 2022-05-28 DIAGNOSIS — F319 Bipolar disorder, unspecified: Secondary | ICD-10-CM | POA: Diagnosis not present

## 2022-05-28 DIAGNOSIS — G2581 Restless legs syndrome: Secondary | ICD-10-CM | POA: Diagnosis not present

## 2022-05-28 DIAGNOSIS — N189 Chronic kidney disease, unspecified: Secondary | ICD-10-CM | POA: Insufficient documentation

## 2022-05-28 DIAGNOSIS — E1140 Type 2 diabetes mellitus with diabetic neuropathy, unspecified: Secondary | ICD-10-CM | POA: Diagnosis not present

## 2022-05-28 DIAGNOSIS — E785 Hyperlipidemia, unspecified: Secondary | ICD-10-CM | POA: Insufficient documentation

## 2022-05-28 DIAGNOSIS — I129 Hypertensive chronic kidney disease with stage 1 through stage 4 chronic kidney disease, or unspecified chronic kidney disease: Secondary | ICD-10-CM | POA: Insufficient documentation

## 2022-05-28 LAB — CMP (CANCER CENTER ONLY)
ALT: 12 U/L (ref 0–44)
AST: 16 U/L (ref 15–41)
Albumin: 3.8 g/dL (ref 3.5–5.0)
Alkaline Phosphatase: 92 U/L (ref 38–126)
Anion gap: 7 (ref 5–15)
BUN: 15 mg/dL (ref 8–23)
CO2: 31 mmol/L (ref 22–32)
Calcium: 9.4 mg/dL (ref 8.9–10.3)
Chloride: 100 mmol/L (ref 98–111)
Creatinine: 1.83 mg/dL — ABNORMAL HIGH (ref 0.61–1.24)
GFR, Estimated: 40 mL/min — ABNORMAL LOW (ref >60)
Glucose, Bld: 98 mg/dL (ref 70–99)
Potassium: 4.2 mmol/L (ref 3.5–5.1)
Sodium: 138 mmol/L (ref 135–145)
Total Bilirubin: 0.4 mg/dL (ref 0.3–1.2)
Total Protein: 7.1 g/dL (ref 6.5–8.1)

## 2022-05-28 LAB — MAGNESIUM: Magnesium: 1.8 mg/dL (ref 1.7–2.4)

## 2022-05-28 NOTE — Progress Notes (Signed)
William Good OFFICE PROGRESS NOTE   Diagnosis: Rectal cancer  INTERVAL HISTORY:   William Good returns for follow-up.  He was last seen at the [facility] 10/13/2021.  He did not return for subsequent follow-up.  He reports erratic/irregular bowel habits.  No rectal bleeding.  He is scheduled for a colonoscopy 06/30/2022.  He has intermittent nausea.  No vomiting.  He has a good appetite.  He is gaining weight.  Objective:  Vital signs in last 24 hours:  Blood pressure (!) 147/92, pulse 75, temperature 98.1 F (36.7 C), temperature source Oral, resp. rate 18, height 6' (1.829 m), weight 267 lb (121.1 kg), SpO2 98 %.    Lymphatics: No palpable cervical, supraclavicular, axillary or inguinal lymph nodes. Resp: Lungs clear bilaterally. Cardio: Regular rate and rhythm. GI: Firm masslike fullness throughout the left mid to lower abdomen, mild associated tenderness.  No hepatomegaly. Vascular: No leg edema.   Lab Results:  Lab Results  Component Value Date   WBC 5.4 01/13/2022   HGB 10.6 (L) 01/13/2022   HCT 35.0 (L) 01/13/2022   MCV 82 01/13/2022   PLT 201 01/13/2022   NEUTROABS 3.5 06/29/2021    Imaging:  No results found.  Medications: I have reviewed the patient's current medications.  Assessment/Plan: Rectal cancer Mass at 6 cm on digital exam/anoscopy 08/25/2020 Colonoscopy 09/05/2020-mid rectal mass-biopsy, adenocarcinoma, no loss of mismatch repair protein expression CT abdomen/pelvis 08/29/2020-small amount of free fluid in the pelvis, mildly enlarged left external iliac node, cystic changes of the left kidney with hydronephrosis and regions of cortical thinning with transition at the left UPJ suggesting chronic left UPJ obstruction MRI pelvis 09/24/2020-tumor 6 cm from the anal verge, 1.8 cm from the internal anal sphincter, T2, 7 mm lymph node in the left mesorectal sheath, N1 Cycle 1 FOLFOX 10/08/2020 Cycle 2 FOLFOX 10/22/2020 Cycle 3 FOLFOX 11/12/2020  (oxaliplatin dose reduced, white cell growth factor support on day of pump discontinuation) Cycle 4 FOLFOX 11/26/2020, 5-FU bolus and infusion dose reduced secondary to diarrhea, Ziextenzo Cycle 5 FOLFOX 12/10/2020, 5-FU bolus held due to diarrhea, Ziextenzo Cycle 6 FOLFOX 12/24/2020, 5-FU bolus held, Ziextenzo Cycle 7 FOLFOX 01/07/2021, oxaliplatin held due to neuropathy, 5-FU bolus held, Ziextenzo held Cycle 8 FOLFOX 01/21/2021, oxaliplatin held due to neuropathy, 5-FU bolus held, Ziextenzo held Radiation/Xeloda 02/02/2021-03/16/2021 Low anterior resection/diverting loop ileostomy 05/27/2021, no residual carcinoma, 0/19 nodes, negative resection margins, ypT0,ypN0 Ileostomy takedown 09/03/2021     Diabetes Chronic kidney disease Bipolar disorder Hypertension Hyperlipidemia Restless leg syndrome Multiple colon polyps removed on the colonoscopy 09/05/2020-tubular adenomas including a rectal polyp with high-grade dysplasia-polypectomy margin negative for dysplasia Portacath placement, Dr Zenia Resides, 10/02/2020 Oxaliplatin neuropathy Diarrhea following ileostomy takedown procedure.  Improved with Lomotil.    Disposition: William Good has a history of clinical stage III rectal cancer.  He had a complete pathologic response.  On exam today he has firm masslike fullness throughout the left mid to low abdomen.  Etiology unclear.  We are referring him for CT scans.  We will contact him once the result is available.  We scheduled a follow-up visit in approximately 8 weeks.  We will adjust to a sooner appointment if needed.  Patient seen with Dr. Benay Spice.    Ned Card ANP/GNP-BC   05/28/2022  2:47 PM  This was a shared visit with Ned Card.  William Good was interviewed and examined.  He is in clinical remission from rectal cancer.  There is persistent left abdominal fullness on exam today.  He will be referred for a CT evaluation.  I was present for greater than 50% of today's visit.  I performed  medical decision making.  Julieanne Manson, MD

## 2022-05-31 ENCOUNTER — Inpatient Hospital Stay: Payer: Medicare Other

## 2022-05-31 ENCOUNTER — Ambulatory Visit (HOSPITAL_BASED_OUTPATIENT_CLINIC_OR_DEPARTMENT_OTHER)
Admission: RE | Admit: 2022-05-31 | Discharge: 2022-05-31 | Disposition: A | Discharge status: Home / Self Care | Payer: Medicare Other | Source: Ambulatory Visit | Attending: Nurse Practitioner | Admitting: Nurse Practitioner

## 2022-05-31 DIAGNOSIS — N200 Calculus of kidney: Secondary | ICD-10-CM | POA: Diagnosis not present

## 2022-05-31 DIAGNOSIS — C20 Malignant neoplasm of rectum: Secondary | ICD-10-CM

## 2022-05-31 DIAGNOSIS — N281 Cyst of kidney, acquired: Secondary | ICD-10-CM | POA: Diagnosis not present

## 2022-05-31 DIAGNOSIS — J439 Emphysema, unspecified: Secondary | ICD-10-CM | POA: Diagnosis not present

## 2022-05-31 LAB — CEA (ACCESS): CEA (CHCC): 2.57 ng/mL (ref 0.00–5.00)

## 2022-06-01 ENCOUNTER — Telehealth: Payer: Self-pay | Admitting: *Deleted

## 2022-06-01 DIAGNOSIS — C20 Malignant neoplasm of rectum: Secondary | ICD-10-CM

## 2022-06-01 NOTE — Telephone Encounter (Signed)
Followed up w/[facility] and he has been scheduled for 06/11/22--OK per Dr. Benay Spice.

## 2022-06-01 NOTE — Telephone Encounter (Signed)
Per Dr. Benay Spice: CT scan shows dilated left kidney. Needs to see urology asap. Spoke with brother, [name] and he has seen Dr. Link Snuffer at [facility] in past. Referral order placed and records faxed to [phone].

## 2022-06-02 DIAGNOSIS — N130 Hydronephrosis with ureteropelvic junction obstruction: Secondary | ICD-10-CM | POA: Diagnosis not present

## 2022-06-02 DIAGNOSIS — N270 Small kidney, unilateral: Secondary | ICD-10-CM | POA: Diagnosis not present

## 2022-06-02 DIAGNOSIS — N3000 Acute cystitis without hematuria: Secondary | ICD-10-CM | POA: Diagnosis not present

## 2022-06-02 DIAGNOSIS — N281 Cyst of kidney, acquired: Secondary | ICD-10-CM | POA: Diagnosis not present

## 2022-06-07 DIAGNOSIS — E1169 Type 2 diabetes mellitus with other specified complication: Secondary | ICD-10-CM | POA: Diagnosis not present

## 2022-06-07 DIAGNOSIS — Z23 Encounter for immunization: Secondary | ICD-10-CM | POA: Diagnosis not present

## 2022-06-07 DIAGNOSIS — C20 Malignant neoplasm of rectum: Secondary | ICD-10-CM | POA: Diagnosis not present

## 2022-06-07 DIAGNOSIS — N183 Chronic kidney disease, stage 3 unspecified: Secondary | ICD-10-CM | POA: Diagnosis not present

## 2022-06-07 DIAGNOSIS — E1122 Type 2 diabetes mellitus with diabetic chronic kidney disease: Secondary | ICD-10-CM | POA: Diagnosis not present

## 2022-06-07 DIAGNOSIS — G43009 Migraine without aura, not intractable, without status migrainosus: Secondary | ICD-10-CM | POA: Diagnosis not present

## 2022-06-07 DIAGNOSIS — E785 Hyperlipidemia, unspecified: Secondary | ICD-10-CM | POA: Diagnosis not present

## 2022-06-10 ENCOUNTER — Other Ambulatory Visit (HOSPITAL_COMMUNITY): Payer: Self-pay | Admitting: Nurse Practitioner

## 2022-06-10 DIAGNOSIS — N1330 Unspecified hydronephrosis: Secondary | ICD-10-CM

## 2022-06-10 DIAGNOSIS — N261 Atrophy of kidney (terminal): Secondary | ICD-10-CM

## 2022-06-10 DIAGNOSIS — N135 Crossing vessel and stricture of ureter without hydronephrosis: Secondary | ICD-10-CM

## 2022-06-11 DIAGNOSIS — N130 Hydronephrosis with ureteropelvic junction obstruction: Secondary | ICD-10-CM | POA: Diagnosis not present

## 2022-06-11 DIAGNOSIS — N270 Small kidney, unilateral: Secondary | ICD-10-CM | POA: Diagnosis not present

## 2022-06-11 DIAGNOSIS — N3000 Acute cystitis without hematuria: Secondary | ICD-10-CM | POA: Diagnosis not present

## 2022-06-22 ENCOUNTER — Encounter (HOSPITAL_COMMUNITY)
Admission: RE | Admit: 2022-06-22 | Discharge: 2022-06-22 | Disposition: A | Discharge status: Home / Self Care | Payer: Medicare Other | Source: Ambulatory Visit | Attending: Nurse Practitioner | Admitting: Nurse Practitioner

## 2022-06-22 DIAGNOSIS — N135 Crossing vessel and stricture of ureter without hydronephrosis: Secondary | ICD-10-CM | POA: Diagnosis not present

## 2022-06-22 DIAGNOSIS — N1330 Unspecified hydronephrosis: Secondary | ICD-10-CM | POA: Insufficient documentation

## 2022-06-22 DIAGNOSIS — N261 Atrophy of kidney (terminal): Secondary | ICD-10-CM | POA: Diagnosis not present

## 2022-06-22 MED ORDER — TECHNETIUM TC 99M MERTIATIDE
5.2000 | Freq: Once | INTRAVENOUS | Status: AC
  Administered 2022-06-22: 5.2 via INTRAVENOUS

## 2022-06-22 MED ORDER — FUROSEMIDE 10 MG/ML IJ SOLN
60.0000 mg | Freq: Once | INTRAMUSCULAR | Status: AC
  Administered 2022-06-22: 60 mg via INTRAVENOUS

## 2022-06-22 MED ORDER — FUROSEMIDE 10 MG/ML IJ SOLN
INTRAMUSCULAR | Status: AC
  Filled 2022-06-22: qty 8

## 2022-06-24 ENCOUNTER — Other Ambulatory Visit: Payer: Self-pay | Admitting: Adult Health

## 2022-07-12 DIAGNOSIS — N3000 Acute cystitis without hematuria: Secondary | ICD-10-CM | POA: Diagnosis not present

## 2022-07-12 DIAGNOSIS — R350 Frequency of micturition: Secondary | ICD-10-CM | POA: Diagnosis not present

## 2022-07-12 DIAGNOSIS — N130 Hydronephrosis with ureteropelvic junction obstruction: Secondary | ICD-10-CM | POA: Diagnosis not present

## 2022-07-12 DIAGNOSIS — N270 Small kidney, unilateral: Secondary | ICD-10-CM | POA: Diagnosis not present

## 2022-07-16 DIAGNOSIS — Z85048 Personal history of other malignant neoplasm of rectum, rectosigmoid junction, and anus: Secondary | ICD-10-CM | POA: Diagnosis not present

## 2022-07-16 DIAGNOSIS — Z980 Intestinal bypass and anastomosis status: Secondary | ICD-10-CM | POA: Diagnosis not present

## 2022-07-16 DIAGNOSIS — D122 Benign neoplasm of ascending colon: Secondary | ICD-10-CM | POA: Diagnosis not present

## 2022-07-16 DIAGNOSIS — D123 Benign neoplasm of transverse colon: Secondary | ICD-10-CM | POA: Diagnosis not present

## 2022-07-16 DIAGNOSIS — D175 Benign lipomatous neoplasm of intra-abdominal organs: Secondary | ICD-10-CM | POA: Diagnosis not present

## 2022-07-16 DIAGNOSIS — Z86010 Personal history of colonic polyps: Secondary | ICD-10-CM | POA: Diagnosis not present

## 2022-07-20 DIAGNOSIS — D122 Benign neoplasm of ascending colon: Secondary | ICD-10-CM | POA: Diagnosis not present

## 2022-07-20 DIAGNOSIS — D123 Benign neoplasm of transverse colon: Secondary | ICD-10-CM | POA: Diagnosis not present

## 2022-07-21 ENCOUNTER — Encounter: Payer: Self-pay | Admitting: Nurse Practitioner

## 2022-07-21 ENCOUNTER — Inpatient Hospital Stay: Payer: Medicare Other | Attending: Oncology

## 2022-07-21 ENCOUNTER — Inpatient Hospital Stay: Payer: Medicare Other | Admitting: Nurse Practitioner

## 2022-07-21 VITALS — BP 136/85 | HR 73 | Temp 98.1°F | Resp 18 | Ht 72.0 in | Wt 270.8 lb

## 2022-07-21 DIAGNOSIS — G2581 Restless legs syndrome: Secondary | ICD-10-CM | POA: Insufficient documentation

## 2022-07-21 DIAGNOSIS — E1122 Type 2 diabetes mellitus with diabetic chronic kidney disease: Secondary | ICD-10-CM | POA: Diagnosis not present

## 2022-07-21 DIAGNOSIS — I129 Hypertensive chronic kidney disease with stage 1 through stage 4 chronic kidney disease, or unspecified chronic kidney disease: Secondary | ICD-10-CM | POA: Diagnosis not present

## 2022-07-21 DIAGNOSIS — G620 Drug-induced polyneuropathy: Secondary | ICD-10-CM | POA: Insufficient documentation

## 2022-07-21 DIAGNOSIS — N189 Chronic kidney disease, unspecified: Secondary | ICD-10-CM | POA: Diagnosis not present

## 2022-07-21 DIAGNOSIS — C20 Malignant neoplasm of rectum: Secondary | ICD-10-CM | POA: Insufficient documentation

## 2022-07-21 DIAGNOSIS — R197 Diarrhea, unspecified: Secondary | ICD-10-CM | POA: Insufficient documentation

## 2022-07-21 DIAGNOSIS — E785 Hyperlipidemia, unspecified: Secondary | ICD-10-CM | POA: Diagnosis not present

## 2022-07-21 DIAGNOSIS — F319 Bipolar disorder, unspecified: Secondary | ICD-10-CM | POA: Insufficient documentation

## 2022-07-21 LAB — CBC WITH DIFFERENTIAL (CANCER CENTER ONLY)
Abs Immature Granulocytes: 0.02 10*3/uL (ref 0.00–0.07)
Basophils Absolute: 0 10*3/uL (ref 0.0–0.1)
Basophils Relative: 0 %
Eosinophils Absolute: 0.1 10*3/uL (ref 0.0–0.5)
Eosinophils Relative: 2 %
HCT: 37.8 % — ABNORMAL LOW (ref 39.0–52.0)
Hemoglobin: 11.3 g/dL — ABNORMAL LOW (ref 13.0–17.0)
Immature Granulocytes: 0 %
Lymphocytes Relative: 23 %
Lymphs Abs: 1.1 10*3/uL (ref 0.7–4.0)
MCH: 24.7 pg — ABNORMAL LOW (ref 26.0–34.0)
MCHC: 29.9 g/dL — ABNORMAL LOW (ref 30.0–36.0)
MCV: 82.5 fL (ref 80.0–100.0)
Monocytes Absolute: 0.3 10*3/uL (ref 0.1–1.0)
Monocytes Relative: 6 %
Neutro Abs: 3.4 10*3/uL (ref 1.7–7.7)
Neutrophils Relative %: 69 %
Platelet Count: 182 10*3/uL (ref 150–400)
RBC: 4.58 MIL/uL (ref 4.22–5.81)
RDW: 15.7 % — ABNORMAL HIGH (ref 11.5–15.5)
WBC Count: 4.9 10*3/uL (ref 4.0–10.5)
nRBC: 0 % (ref 0.0–0.2)

## 2022-07-21 LAB — CMP (CANCER CENTER ONLY)
ALT: 11 U/L (ref 0–44)
AST: 14 U/L — ABNORMAL LOW (ref 15–41)
Albumin: 4 g/dL (ref 3.5–5.0)
Alkaline Phosphatase: 91 U/L (ref 38–126)
Anion gap: 9 (ref 5–15)
BUN: 12 mg/dL (ref 8–23)
CO2: 28 mmol/L (ref 22–32)
Calcium: 8.9 mg/dL (ref 8.9–10.3)
Chloride: 99 mmol/L (ref 98–111)
Creatinine: 1.75 mg/dL — ABNORMAL HIGH (ref 0.61–1.24)
GFR, Estimated: 42 mL/min — ABNORMAL LOW (ref >60)
Glucose, Bld: 174 mg/dL — ABNORMAL HIGH (ref 70–99)
Potassium: 4.3 mmol/L (ref 3.5–5.1)
Sodium: 136 mmol/L (ref 135–145)
Total Bilirubin: 0.3 mg/dL (ref 0.3–1.2)
Total Protein: 6.5 g/dL (ref 6.5–8.1)

## 2022-07-21 LAB — CEA (ACCESS): CEA (CHCC): 2.07 ng/mL (ref 0.00–5.00)

## 2022-07-21 NOTE — Progress Notes (Signed)
William Good   Diagnosis: Rectal cancer  INTERVAL HISTORY:   William Good returns as scheduled.  Bowels are moving more regularly, usually 2 times a day.  No rectal bleeding.  He reports a recent colonoscopy.  He has intermittent left-sided abdominal pain.  Objective:  Vital signs in last 24 hours:  Blood pressure 136/85, pulse 73, temperature 98.1 F (36.7 C), temperature source Oral, resp. rate 18, height 6' (1.829 m), weight 270 lb 12.8 oz (122.8 kg), SpO2 98 %.    Lymphatics: No palpable cervical, supraclavicular, axillary or inguinal lymph nodes. Resp: Lungs clear bilaterally. Cardio: Regular rate and rhythm. GI: Firm masslike fullness about the left abdomen, associated tenderness.  No hepatomegaly. Vascular: No leg edema.   Lab Results:  Lab Results  Component Value Date   WBC 5.4 01/13/2022   HGB 10.6 (L) 01/13/2022   HCT 35.0 (L) 01/13/2022   MCV 82 01/13/2022   PLT 201 01/13/2022   NEUTROABS 3.5 06/29/2021    Imaging:  No results found.  Medications: I have reviewed the patient's current medications.  Assessment/Plan: Rectal cancer Mass at 6 cm on digital exam/anoscopy 08/25/2020 Colonoscopy 09/05/2020-mid rectal mass-biopsy, adenocarcinoma, no loss of mismatch repair protein expression CT abdomen/pelvis 08/29/2020-small amount of free fluid in the pelvis, mildly enlarged left external iliac node, cystic changes of the left kidney with hydronephrosis and regions of cortical thinning with transition at the left UPJ suggesting chronic left UPJ obstruction MRI pelvis 09/24/2020-tumor 6 cm from the anal verge, 1.8 cm from the internal anal sphincter, T2, 7 mm lymph node in the left mesorectal sheath, N1 Cycle 1 FOLFOX 10/08/2020 Cycle 2 FOLFOX 10/22/2020 Cycle 3 FOLFOX 11/12/2020 (oxaliplatin dose reduced, white cell growth factor support on day of pump discontinuation) Cycle 4 FOLFOX 11/26/2020, 5-FU bolus and infusion dose reduced  secondary to diarrhea, Ziextenzo Cycle 5 FOLFOX 12/10/2020, 5-FU bolus held due to diarrhea, Ziextenzo Cycle 6 FOLFOX 12/24/2020, 5-FU bolus held, Ziextenzo Cycle 7 FOLFOX 01/07/2021, oxaliplatin held due to neuropathy, 5-FU bolus held, Ziextenzo held Cycle 8 FOLFOX 01/21/2021, oxaliplatin held due to neuropathy, 5-FU bolus held, Ziextenzo held Radiation/Xeloda 02/02/2021-03/16/2021 Low anterior resection/diverting loop ileostomy 05/27/2021, no residual carcinoma, 0/19 nodes, negative resection margins, ypT0,ypN0 Ileostomy takedown 09/03/2021 CTs 05/31/2022-surgical changes noted of distal colonic resection and primary anastomosis.  Maturing areas of surgical change involving small bowel in the right side of the anterior abdominal/pelvic wall.  Previous abnormal nodes in the left pelvic sidewall are improved.  Nodes in the upper retroperitoneum appear slightly smaller.  Massively enlarged left kidney.  Persistent large right-sided parapelvic renal cyst with nonobstructing renal stones.     Diabetes Chronic kidney disease Bipolar disorder Hypertension Hyperlipidemia Restless leg syndrome Multiple colon polyps removed on the colonoscopy 09/05/2020-tubular adenomas including a rectal polyp with high-grade dysplasia-polypectomy margin negative for dysplasia Portacath placement, Dr Freida Busman, 10/02/2020 Oxaliplatin neuropathy Diarrhea following ileostomy takedown procedure.  Improved with Lomotil. Palpable left abdominal mass on exam 05/28/2022-massively enlarged left kidney; referred to urology  Disposition: Mr. Slavinski appears stable.  He is in clinical remission from rectal cancer.  We will follow-up on the CEA from today.  He reports a recent colonoscopy.  We will try to obtain that report.  Left kidney is massively enlarged.  He is following up with urology.  He has an appointment with Dr. Berneice Heinrich in about 2 weeks.  He will return for CEA and follow-up visit in 3 months.  He will contact the office in  the  interim with any problems.  William Good ANP/GNP-BC   07/21/2022  10:37 AM

## 2022-08-05 NOTE — Progress Notes (Signed)
  Cardiology Office Note:   Date:  08/06/2022  ID:  William Good, William Good [birth year], MRN [id number] PCP: Henrine Screws, MD  [facility] Providers Cardiologist:  None {  History of Present Illness:   William Good is a 67 y.o. male  who presents for preop clearance.  He saw Dr. Shirlee Latch years ago chest pain.  He had a negative perfusion study.  He has rectal cancer and is in need of resection.  He has had treatment with FOLFOX and radiation.  William Good  He was sent to [place] because of his significant risk factors.  He was found on echo to have hypertrophic cardiomyopathy.  He had severe apical hypertrophy on MRI.  LGE scar was 5%.  He had no high risk arrhythmias on monitor.      He is being managed right now for renal insufficiency.  He had renal perfusion study and only had 6% left renal function.  His creatinine has been elevated.  He has not had any new cardiac complaints.  He does report shortness of breath but this has been somewhat chronic.  He gets dyspneic walking 30 yards on level ground.  He does not describe PND or orthopnea.  He does not have chest pressure, neck or arm discomfort.    ROS: As stated in the HPI and negative for all other systems.  Studies Reviewed:    EKG: Sinus rhythm, rate 73, axis within normal limits, intervals within normal limits, diffuse T wave inversions unchanged from previous.   Risk Assessment/Calculations:              Physical Exam:   VS:  BP 120/86 (BP Location: Left Arm, Patient Position: Sitting, Cuff Size: Large)   Pulse 73   Ht 6' (1.829 m)   Wt 273 lb 12.8 oz (124.2 kg)   SpO2 93%   BMI 37.13 kg/m    Wt Readings from Last 3 Encounters:  08/06/22 273 lb 12.8 oz (124.2 kg)  07/21/22 270 lb 12.8 oz (122.8 kg)  05/28/22 267 lb (121.1 kg)     GEN: Well nourished, well developed in no acute distress NECK: No JVD; No carotid bruits CARDIAC: RRR, no murmurs, rubs, gallops RESPIRATORY:  Clear to auscultation without rales, wheezing  or rhonchi  ABDOMEN: Soft, non-tender, non-distended EXTREMITIES:  Mild leg edema; No deformity   ASSESSMENT AND PLAN:   Hypertrophic apical cardiomyopathy: He does have dyspnea which could be related.  However, he does not tolerate meds, he actually had to have beta-blockers discontinued previously because he did not tolerate it.  He has had low blood pressures.  He has renal insufficiency which makes diuresis somewhat difficult.  I am going to check a BNP level.  There is a potential he could be a mavacamten candidate.    HTN: As mentioned above his blood pressure runs low.  No change in therapy.  DM: A1c was 7.2.  No change in therapy.  SOB: This will be evaluated as above.  I cannot really give him negative inotrope/chronotropic because of his blood pressure.  I would want to hold off on diuretics with his renal insufficiency.        Signed, Rollene Rotunda, MD

## 2022-08-06 ENCOUNTER — Ambulatory Visit: Payer: Medicare Other | Attending: Cardiology | Admitting: Cardiology

## 2022-08-06 ENCOUNTER — Encounter: Payer: Self-pay | Admitting: Cardiology

## 2022-08-06 VITALS — BP 120/86 | HR 73 | Ht 72.0 in | Wt 273.8 lb

## 2022-08-06 DIAGNOSIS — I25110 Atherosclerotic heart disease of native coronary artery with unstable angina pectoris: Secondary | ICD-10-CM

## 2022-08-06 DIAGNOSIS — I422 Other hypertrophic cardiomyopathy: Secondary | ICD-10-CM | POA: Diagnosis not present

## 2022-08-06 DIAGNOSIS — R0602 Shortness of breath: Secondary | ICD-10-CM

## 2022-08-06 NOTE — Addendum Note (Signed)
Addended by: Neoma Laming on: 08/06/2022 01:24 PM   Modules accepted: Orders

## 2022-08-06 NOTE — Patient Instructions (Signed)
Medication Instructions:  Continue same medications *If you need a refill on your cardiac medications before your next appointment, please call your pharmacy*   Lab Work: Bnp today   Testing/Procedures: None ordered   Follow-Up: At [facility], you and your health needs are our priority.  As part of our continuing mission to provide you with exceptional heart care, we have created designated Provider Care Teams.  These Care Teams include your primary Cardiologist (physician) and Advanced Practice Providers (APPs -  Physician Assistants and Nurse Practitioners) who all work together to provide you with the care you need, when you need it.  We recommend signing up for the patient portal called "MyChart".  Sign up information is provided on this After Visit Summary.  MyChart is used to connect with patients for Virtual Visits (Telemedicine).  Patients are able to view lab/test results, encounter notes, upcoming appointments, etc.  Non-urgent messages can be sent to your provider as well.   To learn more about what you can do with MyChart, go to [email or web address].    Your next appointment:  6 months    Provider:  Dr.Hochrein  Dr.Joseph's office will call with appointment

## 2022-08-07 LAB — BRAIN NATRIURETIC PEPTIDE: BNP: 85.8 pg/mL (ref 0.0–100.0)

## 2022-08-09 ENCOUNTER — Other Ambulatory Visit: Payer: Self-pay | Admitting: *Deleted

## 2022-08-09 ENCOUNTER — Telehealth: Payer: Self-pay | Admitting: Cardiology

## 2022-08-09 DIAGNOSIS — N202 Calculus of kidney with calculus of ureter: Secondary | ICD-10-CM | POA: Diagnosis not present

## 2022-08-09 DIAGNOSIS — N3020 Other chronic cystitis without hematuria: Secondary | ICD-10-CM | POA: Diagnosis not present

## 2022-08-09 DIAGNOSIS — N270 Small kidney, unilateral: Secondary | ICD-10-CM | POA: Diagnosis not present

## 2022-08-09 DIAGNOSIS — R0602 Shortness of breath: Secondary | ICD-10-CM

## 2022-08-09 DIAGNOSIS — I422 Other hypertrophic cardiomyopathy: Secondary | ICD-10-CM

## 2022-08-09 DIAGNOSIS — N281 Cyst of kidney, acquired: Secondary | ICD-10-CM | POA: Diagnosis not present

## 2022-08-09 DIAGNOSIS — N130 Hydronephrosis with ureteropelvic junction obstruction: Secondary | ICD-10-CM | POA: Diagnosis not present

## 2022-08-09 NOTE — Telephone Encounter (Signed)
Patient is taking Propanolol Er 160 is taking this daily. He has been taking this for quite some time.  Brother in law inadvertently stated patient was not taking this medication, but he IS taking .  He has been tolerating it well. Advised to continue at this time unless different recommendations from provider.  If to continue, will need to be added back to med list. Please advise.

## 2022-08-09 NOTE — Telephone Encounter (Signed)
Medication list updated.

## 2022-08-09 NOTE — Telephone Encounter (Signed)
Pt c/o medication issue:  1. Name of Medication: Propranolol ER 160 MG 1 x daily   2. How are you currently taking this medication (dosage and times per day)?   3. Are you having a reaction (difficulty breathing--STAT)?   4. What is your medication issue? Patient's brother in law is requesting call back to update his med list. He states he told Dr. Antoine Poche  that he stopped this medication, but the patient is in fact still taking this medication. Would like to discuss this further.

## 2022-08-10 ENCOUNTER — Other Ambulatory Visit: Payer: Self-pay | Admitting: Urology

## 2022-08-10 NOTE — Telephone Encounter (Signed)
Spoke with pt, aware to continue propranolol.

## 2022-08-10 NOTE — Progress Notes (Signed)
pt. needs orders for surgery.

## 2022-08-11 ENCOUNTER — Encounter (HOSPITAL_COMMUNITY)
Admission: RE | Admit: 2022-08-11 | Discharge: 2022-08-11 | Disposition: A | Discharge status: Home / Self Care | Payer: Medicare Other | Source: Ambulatory Visit | Attending: Urology | Admitting: Urology

## 2022-08-11 ENCOUNTER — Encounter (HOSPITAL_COMMUNITY): Payer: Self-pay

## 2022-08-11 ENCOUNTER — Other Ambulatory Visit: Payer: Self-pay

## 2022-08-11 VITALS — BP 156/96 | HR 62 | Temp 97.7°F | Ht 72.0 in | Wt 271.0 lb

## 2022-08-11 DIAGNOSIS — N183 Chronic kidney disease, stage 3 unspecified: Secondary | ICD-10-CM | POA: Insufficient documentation

## 2022-08-11 DIAGNOSIS — N132 Hydronephrosis with renal and ureteral calculous obstruction: Secondary | ICD-10-CM | POA: Diagnosis not present

## 2022-08-11 DIAGNOSIS — Z85048 Personal history of other malignant neoplasm of rectum, rectosigmoid junction, and anus: Secondary | ICD-10-CM | POA: Diagnosis not present

## 2022-08-11 DIAGNOSIS — E1122 Type 2 diabetes mellitus with diabetic chronic kidney disease: Secondary | ICD-10-CM | POA: Insufficient documentation

## 2022-08-11 DIAGNOSIS — Z01812 Encounter for preprocedural laboratory examination: Secondary | ICD-10-CM | POA: Diagnosis not present

## 2022-08-11 DIAGNOSIS — I129 Hypertensive chronic kidney disease with stage 1 through stage 4 chronic kidney disease, or unspecified chronic kidney disease: Secondary | ICD-10-CM | POA: Insufficient documentation

## 2022-08-11 DIAGNOSIS — E1159 Type 2 diabetes mellitus with other circulatory complications: Secondary | ICD-10-CM

## 2022-08-11 DIAGNOSIS — E118 Type 2 diabetes mellitus with unspecified complications: Secondary | ICD-10-CM

## 2022-08-11 HISTORY — DX: Angina pectoris, unspecified: I20.9

## 2022-08-11 HISTORY — DX: Palpitations: R00.2

## 2022-08-11 HISTORY — DX: Atherosclerotic heart disease of native coronary artery without angina pectoris: I25.10

## 2022-08-11 LAB — GLUCOSE, CAPILLARY: Glucose-Capillary: 185 mg/dL — ABNORMAL HIGH (ref 70–99)

## 2022-08-11 NOTE — Patient Instructions (Signed)
DUE TO COVID-19 ONLY TWO VISITORS  (aged 68 and older)  ARE ALLOWED TO COME WITH YOU AND STAY IN THE WAITING ROOM ONLY DURING PRE OP AND PROCEDURE.   **NO VISITORS ARE ALLOWED IN THE SHORT STAY AREA OR RECOVERY ROOM!!**  IF YOU WILL BE ADMITTED INTO THE HOSPITAL YOU ARE ALLOWED ONLY FOUR SUPPORT PEOPLE DURING VISITATION HOURS ONLY (7 AM -8PM)   The support person(s) must pass our screening, gel in and out, and wear a mask at all times, including in the patient's room. Patients must also wear a mask when staff or their support person are in the room. Visitors GUEST BADGE MUST BE WORN VISIBLY  One adult visitor may remain with you overnight and MUST be in the room by 8 P.M.     Your procedure is scheduled on: 08/13/22   Report to [facility] Main Entrance    Report to admitting at : 1:00 PM   Call this number if you have problems the morning of surgery [phone]   Do not eat food :After Midnight.   After Midnight you may have the following liquids until : 12:00 PM DAY OF SURGERY  Water Black Coffee (sugar ok, NO MILK/CREAM OR CREAMERS)  Tea (sugar ok, NO MILK/CREAM OR CREAMERS) regular and decaf                             Plain Jell-O (NO RED)                                           Fruit ices (not with fruit pulp, NO RED)                                     Popsicles (NO RED)                                                                  Juice: apple, WHITE grape, WHITE cranberry Sports drinks like Gatorade (NO RED)              Oral Hygiene is also important to reduce your risk of infection.                                    Remember - BRUSH YOUR TEETH THE MORNING OF SURGERY WITH YOUR REGULAR TOOTHPASTE  DENTURES WILL BE REMOVED PRIOR TO SURGERY PLEASE DO NOT APPLY "Poly grip" OR ADHESIVES!!!   Do NOT smoke after Midnight   Take these medicines the morning of surgery with A SIP OF WATER: propranolol.Tylenol,alprazolam,sumatriptan,omeprazole as needed.  DO NOT  TAKE ANY ORAL DIABETIC MEDICATIONS DAY OF YOUR SURGERY  Bring CPAP mask and tubing day of surgery.                              You may not have any metal on your body including hair pins, jewelry, and body piercing  Do not wear lotions, powders, perfumes/cologne, or deodorant              Men may shave face and neck.   Do not bring valuables to the hospital. [facility] IS NOT             RESPONSIBLE   FOR VALUABLES.   Contacts, glasses, or bridgework may not be worn into surgery.   Bring small overnight bag day of surgery.   DO NOT BRING YOUR HOME MEDICATIONS TO THE HOSPITAL. PHARMACY WILL DISPENSE MEDICATIONS LISTED ON YOUR MEDICATION LIST TO YOU DURING YOUR ADMISSION IN THE HOSPITAL!    Patients discharged on the day of surgery will not be allowed to drive home.  Someone NEEDS to stay with you for the first 24 hours after anesthesia.   Special Instructions: Bring a copy of your healthcare power of attorney and living will documents         the day of surgery if you haven't scanned them before.              Please read over the following fact sheets you were given: IF YOU HAVE QUESTIONS ABOUT YOUR PRE-OP INSTRUCTIONS PLEASE CALL [phone]    [facility] - Preparing for Surgery Before surgery, you can play an important role.  Because skin is not sterile, your skin needs to be as free of germs as possible.  You can reduce the number of germs on your skin by washing with CHG (chlorahexidine gluconate) soap before surgery.  CHG is an antiseptic cleaner which kills germs and bonds with the skin to continue killing germs even after washing. Please DO NOT use if you have an allergy to CHG or antibacterial soaps.  If your skin becomes reddened/irritated stop using the CHG and inform your nurse when you arrive at Short Stay. Do not shave (including legs and underarms) for at least 48 hours prior to the first CHG shower.  You may shave your face/neck. Please follow these  instructions carefully:  1.  Shower with CHG Soap the night before surgery and the  morning of Surgery.  2.  If you choose to wash your hair, wash your hair first as usual with your  normal  shampoo.  3.  After you shampoo, rinse your hair and body thoroughly to remove the  shampoo.                           4.  Use CHG as you would any other liquid soap.  You can apply chg directly  to the skin and wash                       Gently with a scrungie or clean washcloth.  5.  Apply the CHG Soap to your body ONLY FROM THE NECK DOWN.   Do not use on face/ open                           Wound or open sores. Avoid contact with eyes, ears mouth and genitals (private parts).                       Wash face,  Genitals (private parts) with your normal soap.             6.  Wash thoroughly, paying special attention to the area where your surgery  will be performed.  7.  Thoroughly rinse your body with warm water from the neck down.  8.  DO NOT shower/wash with your normal soap after using and rinsing off  the CHG Soap.                9.  Pat yourself dry with a clean towel.            10.  Wear clean pajamas.            11.  Place clean sheets on your bed the night of your first shower and do not  sleep with pets. Day of Surgery : Do not apply any lotions/deodorants the morning of surgery.  Please wear clean clothes to the hospital/surgery center.  FAILURE TO FOLLOW THESE INSTRUCTIONS MAY RESULT IN THE CANCELLATION OF YOUR SURGERY PATIENT SIGNATURE_________________________________  NURSE SIGNATURE__________________________________  ________________________________________________________________________

## 2022-08-11 NOTE — Progress Notes (Addendum)
For Short Stay: COVID SWAB appointment date:  Bowel Prep reminder:N/A   For Anesthesia: PCP - Dr. Henrine Screws Cardiologist - Dr. Rollene Rotunda.: Clearance: 08/06/22  Chest x-ray -  EKG - 08/06/22 Stress Test -  ECHO - 04/01/21 Cardiac Cath -  Pacemaker/ICD device last checked: Pacemaker orders received: Device Rep notified:  Spinal Cord Stimulator: N/A  Sleep Study -  CPAP -   Fasting Blood Sugar - N/A Checks Blood Sugar ___0__ times a day Date and result of last Hgb A1c-  Last dose of GLP1 agonist- N/A GLP1 instructions:   Last dose of SGLT-2 inhibitors- N/A SGLT-2 instructions:   Blood Thinner Instructions: Aspirin Instructions: No instructions Last Dose:  Activity level: Can go up a flight of stairs and activities of daily living without stopping and without chest pain and/or shortness of breath   Able to exercise without chest pain and/or shortness of breath  Anesthesia review: Hx: DIA,HTN,CAD,CKD III  Patient denies shortness of breath, fever, cough and chest pain at PAT appointment   Patient verbalized understanding of instructions that were given to them at the PAT appointment. Patient was also instructed that they will need to review over the PAT instructions again at home before surgery.

## 2022-08-12 ENCOUNTER — Telehealth: Payer: Self-pay | Admitting: Cardiology

## 2022-08-12 LAB — HEMOGLOBIN A1C
Hgb A1c MFr Bld: 7.4 % — ABNORMAL HIGH (ref 4.8–5.6)
Mean Plasma Glucose: 166 mg/dL

## 2022-08-12 NOTE — Telephone Encounter (Signed)
   Pre-operative Risk Assessment    Patient Name: William Good  DOB: [birth year] MRN: [id number]      Request for Surgical Clearance    Procedure:  CYSTOSCOPY WITH BILATERAL RETROGRADE PYELOGRAMS, RIGHT  URETEROSCOPY AND BILATERAL STENT PLACEMENT (Bilateral)  HOLMIUM LASER APPLICATION (Right)   Date of Surgery:  Clearance 08/10/2022                                 Surgeon:  Lucile Shutters Group or Practice Name:  [facility]  Phone number:  [phone] Fax number:  [phone]   Type of Clearance Requested:   - Medical    Type of Anesthesia:  General    Additional requests/questions:    SignedFrancesca Jewett   08/12/2022, 11:51 AM

## 2022-08-12 NOTE — Telephone Encounter (Signed)
This was already addressed by Dr. Izora Ribas.   Thanks! Sharlene Dory, PA-C

## 2022-08-12 NOTE — Anesthesia Preprocedure Evaluation (Addendum)
Anesthesia Evaluation  Patient identified by MRN, date of birth, ID band Patient awake    Reviewed: Allergy & Precautions, NPO status , Patient's Chart, lab work & pertinent test results  Airway Mallampati: II  TM Distance: >3 FB Neck ROM: Full    Dental  (+) Edentulous Upper, Edentulous Lower   Pulmonary former smoker   Pulmonary exam normal        Cardiovascular hypertension, Pt. on home beta blockers Normal cardiovascular exam     Neuro/Psych  Headaches PSYCHIATRIC DISORDERS Anxiety Depression Bipolar Disorder      GI/Hepatic Neg liver ROS,GERD  Medicated and Controlled,,  Endo/Other  diabetes    Renal/GU Renal disease     Musculoskeletal negative musculoskeletal ROS (+)    Abdominal  (+) + obese  Peds  Hematology  (+) Blood dyscrasia, anemia   Anesthesia Other Findings RIGHT RENAL STONE LEFT HYDRONEPHROSIS  Reproductive/Obstetrics                              Anesthesia Physical Anesthesia Plan  ASA: 3  Anesthesia Plan: General   Post-op Pain Management:    Induction: Intravenous  PONV Risk Score and Plan: 2 and Ondansetron, Dexamethasone, Midazolam and Treatment may vary due to age or medical condition  Airway Management Planned: LMA  Additional Equipment:   Intra-op Plan:   Post-operative Plan: Extubation in OR  Informed Consent: I have reviewed the patients History and Physical, chart, labs and discussed the procedure including the risks, benefits and alternatives for the proposed anesthesia with the patient or authorized representative who has indicated his/her understanding and acceptance.       Plan Discussed with: CRNA  Anesthesia Plan Comments: (PAT note 08/11/2022)        Anesthesia Quick Evaluation

## 2022-08-12 NOTE — Progress Notes (Signed)
Anesthesia Chart Review   Case: [id number] Date/Time: 08/13/22 1500   Procedures:      CYSTOSCOPY WITH BILATERAL RETROGRADE PYELOGRAMS, RIGHT  URETEROSCOPY AND BILATERAL STENT PLACEMENT (Bilateral) - 75 MINS     HOLMIUM LASER APPLICATION (Right)   Anesthesia type: General   Pre-op diagnosis: RIGHT RENAL STONE. LEFT HYDRONEPHROSIS   Location: [facility]   Surgeons: Loletta Parish., MD       DISCUSSION:66 y.o. former smoker with h/o HTN, DM II, CKD Stage III, rectal cancer, right renal stone hydronephrosis scheduled for above procedure 08/13/2022 with Dr. Sebastian Ache.   Pt seen by cardiology 08/06/2022 for preoperative evaluation.  Per OV note, "Hypertrophic apical cardiomyopathy: He does have dyspnea which could be related.  However, he does not tolerate meds, he actually had to have beta-blockers discontinued previously because he did not tolerate it.  He has had low blood pressures.  He has renal insufficiency which makes diuresis somewhat difficult.  I am going to check a BNP level.  There is a potential he could be a mavacamten candidate. "  BNP not elevated, referred to Dr. Izora Ribas given dyspnea and HCM.   Discussed with Dr. Timoteo Gaul, cardiovascular risk low, pt with apical hypertrophic cardiomyopathy, not obstructive.  He has some degree of chronic shortness of breath.  He should stay on propanolol for now, this may be contributing to his fatigue.  VS: BP (!) 156/96   Pulse 62   Temp 36.5 C (Oral)   Ht 6' (1.829 m)   Wt 122.9 kg   SpO2 96%   BMI 36.75 kg/m   PROVIDERS: Henrine Screws, MD is PCP   Cardiologist - Dr. Rollene Rotunda  LABS: Labs reviewed: Acceptable for surgery. (all labs ordered are listed, but only abnormal results are displayed)  Labs Reviewed  HEMOGLOBIN A1C - Abnormal; Notable for the following components:      Result Value   Hgb A1c MFr Bld 7.4 (*)    All other components within normal limits  GLUCOSE, CAPILLARY -  Abnormal; Notable for the following components:   Glucose-Capillary 185 (*)    All other components within normal limits     IMAGES:   EKG:   CV: Echo 04/01/2021 1. There is severe asymmetric hypertrophy of the apical segments (up to  18 mm) consistent with apical variant hypertrophic cardiomyopathy. There  is concern for an apical aneurysm on this study, however this was a  difficult study. Would recommend cardiac   MRI for better characterization. Left ventricular ejection fraction, by  estimation, is 55 to 60%. The left ventricle has normal function. The left  ventricle has no regional wall motion abnormalities. There is severe  asymmetric left ventricular  hypertrophy of the apical segment. Left ventricular diastolic parameters  are consistent with Grade I diastolic dysfunction (impaired relaxation).   2. Right ventricular systolic function is normal. The right ventricular  size is normal. Tricuspid regurgitation signal is inadequate for assessing  PA pressure.   3. The mitral valve is grossly normal. Trivial mitral valve  regurgitation. No evidence of mitral stenosis.   4. The aortic valve is grossly normal. Aortic valve regurgitation is not  visualized. No aortic stenosis is present.   5. The inferior vena cava is normal in size with greater than 50%  respiratory variability, suggesting right atrial pressure of 3 mmHg.  Past Medical History:  Diagnosis Date   Anginal pain (HCC)    Anxiety    Back pain  Bipolar disorder (HCC)    bipolar 1   CKD (chronic kidney disease) stage 2, GFR 60-89 ml/min 09/24/2020   Coronary artery disease    GERD (gastroesophageal reflux disease)    High output ileostomy (HCC) 05/29/2021   Hypertension    Knee pain    Migraine    Palpitation    Rectal cancer (HCC)    Type 2 diabetes mellitus with complication, without long-term current use of insulin (HCC) 03/19/2021   no meds    Past Surgical History:  Procedure Laterality Date    BACK SURGERY     ILEO LOOP DIVERSION N/A 09/03/2021   Procedure: OPEN TAKEDOWN OF LOOP ILEOSTOMY;  Surgeon: Karie Soda, MD;  Location: [facility];  Service: General;  Laterality: N/A;   JOINT REPLACEMENT Right    knee   OSTOMY N/A 05/27/2021   Procedure: POSSIBLE OSTOMY;  Surgeon: Karie Soda, MD;  Location: [facility];  Service: General;  Laterality: N/A;   PORT-A-CATH REMOVAL Right 09/03/2021   Procedure: REMOVAL PORT-A-CATH;  Surgeon: Karie Soda, MD;  Location: [facility];  Service: General;  Laterality: Right;   PORTACATH PLACEMENT N/A 10/02/2020   Procedure: PORT-A-CATH PLACEMENT;  Surgeon: Fritzi Mandes, MD;  Location: [facility];  Service: General;  Laterality: N/A;   PROCTOSCOPY N/A 05/27/2021   Procedure: RIGID PROCTOSCOPY;  Surgeon: Karie Soda, MD;  Location: [facility];  Service: General;  Laterality: N/A;   XI ROBOTIC ASSISTED LOWER ANTERIOR RESECTION N/A 05/27/2021   Procedure: ROBOTIC LOW ANTERIOR COLON RESECTION, VERY LOW COLOANAL ANASTOMOSIS, DIVERTING ILEOSTOMY, BILATERAL TAP BLOCK,  FIRFLY INJECTION;  Surgeon: Karie Soda, MD;  Location: [facility];  Service: General;  Laterality: N/A;    MEDICATIONS:  acetaminophen (TYLENOL) 325 MG tablet   ALPRAZolam (XANAX) 1 MG tablet   aspirin EC 81 MG tablet   Cholecalciferol (VITAMIN D3) 50 MCG (2000 UT) capsule   ciprofloxacin (CIPRO) 250 MG tablet   FIBER PO   melatonin 3 MG TABS tablet   mirtazapine (REMERON) 45 MG tablet   omeprazole (PRILOSEC) 20 MG capsule   polycarbophil (FIBERCON) 625 MG tablet   pravastatin (PRAVACHOL) 40 MG tablet   propranolol ER (INDERAL LA) 160 MG SR capsule   risperiDONE (RISPERDAL) 0.25 MG tablet   SUMAtriptan (IMITREX) 100 MG tablet   tamsulosin (FLOMAX) 0.4 MG CAPS capsule   traZODone (DESYREL) 50 MG tablet   No current facility-administered medications for this encounter.    Jodell Cipro Ward, PA-C [facility] [phone]

## 2022-08-13 ENCOUNTER — Encounter (HOSPITAL_COMMUNITY)
Admission: RE | Disposition: A | Discharge status: Home / Self Care | Payer: Self-pay | Source: Home / Self Care | Attending: Urology

## 2022-08-13 ENCOUNTER — Ambulatory Visit (HOSPITAL_COMMUNITY): Payer: Medicare Other | Admitting: Physician Assistant

## 2022-08-13 ENCOUNTER — Ambulatory Visit (HOSPITAL_COMMUNITY)
Admission: RE | Admit: 2022-08-13 | Discharge: 2022-08-13 | Disposition: A | Discharge status: Home / Self Care | Payer: Medicare Other | Attending: Urology | Admitting: Urology

## 2022-08-13 ENCOUNTER — Ambulatory Visit (HOSPITAL_BASED_OUTPATIENT_CLINIC_OR_DEPARTMENT_OTHER): Payer: Medicare Other | Admitting: Anesthesiology

## 2022-08-13 ENCOUNTER — Encounter (HOSPITAL_COMMUNITY): Payer: Self-pay | Admitting: Urology

## 2022-08-13 ENCOUNTER — Ambulatory Visit (HOSPITAL_COMMUNITY): Payer: Medicare Other

## 2022-08-13 DIAGNOSIS — Z87891 Personal history of nicotine dependence: Secondary | ICD-10-CM | POA: Diagnosis not present

## 2022-08-13 DIAGNOSIS — Z09 Encounter for follow-up examination after completed treatment for conditions other than malignant neoplasm: Secondary | ICD-10-CM | POA: Insufficient documentation

## 2022-08-13 DIAGNOSIS — N1832 Chronic kidney disease, stage 3b: Secondary | ICD-10-CM | POA: Diagnosis not present

## 2022-08-13 DIAGNOSIS — N136 Pyonephrosis: Secondary | ICD-10-CM | POA: Diagnosis not present

## 2022-08-13 DIAGNOSIS — E1122 Type 2 diabetes mellitus with diabetic chronic kidney disease: Secondary | ICD-10-CM | POA: Insufficient documentation

## 2022-08-13 DIAGNOSIS — K219 Gastro-esophageal reflux disease without esophagitis: Secondary | ICD-10-CM | POA: Diagnosis not present

## 2022-08-13 DIAGNOSIS — Z85048 Personal history of other malignant neoplasm of rectum, rectosigmoid junction, and anus: Secondary | ICD-10-CM | POA: Insufficient documentation

## 2022-08-13 DIAGNOSIS — Z6836 Body mass index (BMI) 36.0-36.9, adult: Secondary | ICD-10-CM | POA: Insufficient documentation

## 2022-08-13 DIAGNOSIS — I10 Essential (primary) hypertension: Secondary | ICD-10-CM

## 2022-08-13 DIAGNOSIS — N1339 Other hydronephrosis: Secondary | ICD-10-CM | POA: Diagnosis not present

## 2022-08-13 DIAGNOSIS — Z08 Encounter for follow-up examination after completed treatment for malignant neoplasm: Secondary | ICD-10-CM | POA: Insufficient documentation

## 2022-08-13 DIAGNOSIS — N281 Cyst of kidney, acquired: Secondary | ICD-10-CM

## 2022-08-13 DIAGNOSIS — Z87440 Personal history of urinary (tract) infections: Secondary | ICD-10-CM | POA: Insufficient documentation

## 2022-08-13 DIAGNOSIS — E119 Type 2 diabetes mellitus without complications: Secondary | ICD-10-CM | POA: Diagnosis not present

## 2022-08-13 DIAGNOSIS — N201 Calculus of ureter: Secondary | ICD-10-CM | POA: Diagnosis not present

## 2022-08-13 DIAGNOSIS — I129 Hypertensive chronic kidney disease with stage 1 through stage 4 chronic kidney disease, or unspecified chronic kidney disease: Secondary | ICD-10-CM | POA: Insufficient documentation

## 2022-08-13 DIAGNOSIS — N261 Atrophy of kidney (terminal): Secondary | ICD-10-CM | POA: Diagnosis not present

## 2022-08-13 DIAGNOSIS — N132 Hydronephrosis with renal and ureteral calculous obstruction: Secondary | ICD-10-CM | POA: Diagnosis not present

## 2022-08-13 DIAGNOSIS — Q600 Renal agenesis, unilateral: Secondary | ICD-10-CM | POA: Diagnosis not present

## 2022-08-13 HISTORY — PX: CYSTOSCOPY WITH RETROGRADE PYELOGRAM, URETEROSCOPY AND STENT PLACEMENT: SHX5789

## 2022-08-13 HISTORY — PX: HOLMIUM LASER APPLICATION: SHX5852

## 2022-08-13 LAB — GLUCOSE, CAPILLARY: Glucose-Capillary: 148 mg/dL — ABNORMAL HIGH (ref 70–99)

## 2022-08-13 SURGERY — CYSTOURETEROSCOPY, WITH RETROGRADE PYELOGRAM AND STENT INSERTION
Anesthesia: General | Laterality: Right

## 2022-08-13 MED ORDER — PHENYLEPHRINE 80 MCG/ML (10ML) SYRINGE FOR IV PUSH (FOR BLOOD PRESSURE SUPPORT)
PREFILLED_SYRINGE | INTRAVENOUS | Status: AC
  Filled 2022-08-13: qty 20

## 2022-08-13 MED ORDER — MIDAZOLAM HCL 5 MG/5ML IJ SOLN
INTRAMUSCULAR | Status: DC | PRN
  Administered 2022-08-13: 1 mg via INTRAVENOUS

## 2022-08-13 MED ORDER — PROPOFOL 10 MG/ML IV BOLUS
INTRAVENOUS | Status: AC
  Filled 2022-08-13: qty 20

## 2022-08-13 MED ORDER — FENTANYL CITRATE (PF) 100 MCG/2ML IJ SOLN
INTRAMUSCULAR | Status: AC
  Filled 2022-08-13: qty 2

## 2022-08-13 MED ORDER — MIDAZOLAM HCL 2 MG/2ML IJ SOLN
INTRAMUSCULAR | Status: AC
  Filled 2022-08-13: qty 2

## 2022-08-13 MED ORDER — PHENYLEPHRINE 80 MCG/ML (10ML) SYRINGE FOR IV PUSH (FOR BLOOD PRESSURE SUPPORT)
PREFILLED_SYRINGE | INTRAVENOUS | Status: AC
  Filled 2022-08-13: qty 10

## 2022-08-13 MED ORDER — ONDANSETRON HCL 4 MG/2ML IJ SOLN: 4.0000 mg | Freq: Once | INTRAMUSCULAR | Status: DC | PRN

## 2022-08-13 MED ORDER — ACETAMINOPHEN 10 MG/ML IV SOLN
INTRAVENOUS | Status: AC
  Filled 2022-08-13: qty 100

## 2022-08-13 MED ORDER — SODIUM CHLORIDE 0.9 % IV SOLN
2.0000 g | INTRAVENOUS | Status: AC
  Administered 2022-08-13: 2 g via INTRAVENOUS
  Filled 2022-08-13: qty 20

## 2022-08-13 MED ORDER — DEXAMETHASONE SODIUM PHOSPHATE 10 MG/ML IJ SOLN
INTRAMUSCULAR | Status: DC | PRN
  Administered 2022-08-13: 8 mg via INTRAVENOUS

## 2022-08-13 MED ORDER — ONDANSETRON HCL 4 MG/2ML IJ SOLN
INTRAMUSCULAR | Status: AC
  Filled 2022-08-13: qty 4

## 2022-08-13 MED ORDER — LACTATED RINGERS IV SOLN: INTRAVENOUS | Status: DC

## 2022-08-13 MED ORDER — AMISULPRIDE (ANTIEMETIC) 5 MG/2ML IV SOLN
INTRAVENOUS | Status: AC
  Filled 2022-08-13: qty 4

## 2022-08-13 MED ORDER — DEXAMETHASONE SODIUM PHOSPHATE 10 MG/ML IJ SOLN
INTRAMUSCULAR | Status: AC
  Filled 2022-08-13: qty 2

## 2022-08-13 MED ORDER — CHLORHEXIDINE GLUCONATE 0.12 % MT SOLN
15.0000 mL | Freq: Once | OROMUCOSAL | Status: AC
  Administered 2022-08-13: 15 mL via OROMUCOSAL

## 2022-08-13 MED ORDER — LIDOCAINE HCL (CARDIAC) PF 100 MG/5ML IV SOSY
PREFILLED_SYRINGE | INTRAVENOUS | Status: DC | PRN
  Administered 2022-08-13: 60 mg via INTRAVENOUS

## 2022-08-13 MED ORDER — PROPOFOL 10 MG/ML IV BOLUS
INTRAVENOUS | Status: DC | PRN
  Administered 2022-08-13: 150 mg via INTRAVENOUS

## 2022-08-13 MED ORDER — LIDOCAINE HCL (PF) 2 % IJ SOLN
INTRAMUSCULAR | Status: AC
  Filled 2022-08-13: qty 5

## 2022-08-13 MED ORDER — OXYCODONE-ACETAMINOPHEN 5-325 MG PO TABS: 1.0000 | ORAL_TABLET | Freq: Four times a day (QID) | ORAL | 0 refills | Status: DC | PRN

## 2022-08-13 MED ORDER — ACETAMINOPHEN 10 MG/ML IV SOLN
1000.0000 mg | Freq: Once | INTRAVENOUS | Status: DC | PRN
  Administered 2022-08-13: 1000 mg via INTRAVENOUS

## 2022-08-13 MED ORDER — AMISULPRIDE (ANTIEMETIC) 5 MG/2ML IV SOLN
10.0000 mg | Freq: Once | INTRAVENOUS | Status: AC | PRN
  Administered 2022-08-13: 10 mg via INTRAVENOUS

## 2022-08-13 MED ORDER — SENNOSIDES-DOCUSATE SODIUM 8.6-50 MG PO TABS: 1.0000 | ORAL_TABLET | Freq: Two times a day (BID) | ORAL | 0 refills | Status: DC

## 2022-08-13 MED ORDER — IOHEXOL 300 MG/ML  SOLN
INTRAMUSCULAR | Status: DC | PRN
  Administered 2022-08-13: 35 mL

## 2022-08-13 MED ORDER — FENTANYL CITRATE PF 50 MCG/ML IJ SOSY
25.0000 ug | PREFILLED_SYRINGE | INTRAMUSCULAR | Status: DC | PRN
  Administered 2022-08-13: 50 ug via INTRAVENOUS

## 2022-08-13 MED ORDER — PROPOFOL 500 MG/50ML IV EMUL
INTRAVENOUS | Status: AC
  Filled 2022-08-13: qty 100

## 2022-08-13 MED ORDER — ORAL CARE MOUTH RINSE: 15.0000 mL | Freq: Once | OROMUCOSAL | Status: AC

## 2022-08-13 MED ORDER — DEXAMETHASONE SODIUM PHOSPHATE 10 MG/ML IJ SOLN
INTRAMUSCULAR | Status: AC
  Filled 2022-08-13: qty 1

## 2022-08-13 MED ORDER — SODIUM CHLORIDE 0.9 % IR SOLN
Status: DC | PRN
  Administered 2022-08-13: 6000 mL via INTRAVESICAL

## 2022-08-13 MED ORDER — ONDANSETRON HCL 4 MG/2ML IJ SOLN
INTRAMUSCULAR | Status: DC | PRN
  Administered 2022-08-13: 4 mg via INTRAVENOUS

## 2022-08-13 MED ORDER — FENTANYL CITRATE PF 50 MCG/ML IJ SOSY
PREFILLED_SYRINGE | INTRAMUSCULAR | Status: AC
  Filled 2022-08-13: qty 1

## 2022-08-13 MED ORDER — DEXMEDETOMIDINE HCL IN NACL 80 MCG/20ML IV SOLN
INTRAVENOUS | Status: AC
  Filled 2022-08-13: qty 20

## 2022-08-13 MED ORDER — PROPOFOL 1000 MG/100ML IV EMUL
INTRAVENOUS | Status: AC
  Filled 2022-08-13: qty 100

## 2022-08-13 MED ORDER — LIDOCAINE HCL (PF) 2 % IJ SOLN
INTRAMUSCULAR | Status: AC
  Filled 2022-08-13: qty 10

## 2022-08-13 MED ORDER — FENTANYL CITRATE (PF) 100 MCG/2ML IJ SOLN
INTRAMUSCULAR | Status: DC | PRN
  Administered 2022-08-13: 50 ug via INTRAVENOUS

## 2022-08-13 MED ORDER — ONDANSETRON HCL 4 MG/2ML IJ SOLN
INTRAMUSCULAR | Status: AC
  Filled 2022-08-13: qty 2

## 2022-08-13 SURGICAL SUPPLY — 25 items
BAG URO CATCHER STRL LF (MISCELLANEOUS) ×2 IMPLANT
BASKET LASER NITINOL 1.9FR (BASKET) IMPLANT
BSKT STON RTRVL 120 1.9FR (BASKET) ×2
CATH URETL OPEN END 6FR 70 (CATHETERS) ×2 IMPLANT
CLOTH BEACON ORANGE TIMEOUT ST (SAFETY) ×2 IMPLANT
EXTRACTOR STONE 1.7FRX115CM (UROLOGICAL SUPPLIES) IMPLANT
GLOVE SURG LX STRL 7.5 STRW (GLOVE) ×2 IMPLANT
GOWN STRL REUS W/ TWL XL LVL3 (GOWN DISPOSABLE) ×2 IMPLANT
GOWN STRL REUS W/TWL XL LVL3 (GOWN DISPOSABLE) ×2
GUIDEWIRE ANG ZIPWIRE 038X150 (WIRE) ×2 IMPLANT
GUIDEWIRE STR DUAL SENSOR (WIRE) ×2 IMPLANT
KIT TURNOVER KIT A (KITS) IMPLANT
LASER FIB FLEXIVA PULSE ID 365 (Laser) IMPLANT
LASER FIB FLEXIVA PULSE ID 550 (Laser) IMPLANT
LASER FIB FLEXIVA PULSE ID 910 (Laser) IMPLANT
MANIFOLD NEPTUNE II (INSTRUMENTS) ×2 IMPLANT
PACK CYSTO (CUSTOM PROCEDURE TRAY) ×2 IMPLANT
SHEATH NAVIGATOR HD 11/13X28 (SHEATH) IMPLANT
SHEATH NAVIGATOR HD 11/13X36 (SHEATH) IMPLANT
STENT URET 6FRX28 CONTOUR (STENTS) IMPLANT
TRACTIP FLEXIVA PULS ID 200XHI (Laser) IMPLANT
TRACTIP FLEXIVA PULSE ID 200 (Laser) ×2
TUBE PU 8FR 16IN ENFIT (TUBING) ×2 IMPLANT
TUBING CONNECTING 10 (TUBING) ×2 IMPLANT
TUBING UROLOGY SET (TUBING) ×2 IMPLANT

## 2022-08-13 NOTE — Brief Op Note (Signed)
08/13/2022  3:59 PM  PATIENT:  William Good  67 y.o. male  PRE-OPERATIVE DIAGNOSIS:  RIGHT RENAL STONE. LEFT HYDRONEPHROSIS  POST-OPERATIVE DIAGNOSIS:  RIGHT RENAL STONE. LEFT HYDRONEPHROSIS  PROCEDURE:  Procedure(s) with comments: CYSTOSCOPY WITH BILATERAL RETROGRADE PYELOGRAMS, RIGHT  URETEROSCOPY AND BILATERAL STENT PLACEMENT (Bilateral) - 75 MINS HOLMIUM LASER APPLICATION (Right)  SURGEON:  Surgeon(s) and Role:    * Ashvin Adelson, Delbert Phenix., MD - Primary  PHYSICIAN ASSISTANT:   ASSISTANTS: none   ANESTHESIA:   general  EBL:  50 mL   BLOOD ADMINISTERED:none  DRAINS: none   LOCAL MEDICATIONS USED:  NONE  SPECIMEN:  Source of Specimen:  Rt renal stone fragments  DISPOSITION OF SPECIMEN:   [facility] for compositional analysis  COUNTS:  YES  TOURNIQUET:  * No tourniquets in log *  DICTATION: .Other Dictation: Dictation Number [id number]  PLAN OF CARE: Discharge to home after PACU  PATIENT DISPOSITION:  PACU - hemodynamically stable.   Delay start of Pharmacological VTE agent (>24hrs) due to surgical blood loss or risk of bleeding: yes

## 2022-08-13 NOTE — Telephone Encounter (Signed)
UPDATE: NOTES FROM JESSICA WARD, PAC: Yes, I talked with Dr. Timoteo Gaul yesterday and completed my anesthesia note.   ME: do we need to do anything else   JESSICA WARD, PAC: No, I can let the surgeon's office know   ME: ok ladies if you are good I will update the clearance request in the chart of our conversation and will fax over to surgeon office, does this sound like a good plan to both of you ladies

## 2022-08-13 NOTE — Anesthesia Procedure Notes (Signed)
Procedure Name: LMA Insertion Date/Time: 08/13/2022 2:39 PM  Performed by: Shanon Payor, CRNAPre-anesthesia Checklist: Patient identified, Emergency Drugs available, Suction available, Patient being monitored and Timeout performed Patient Re-evaluated:Patient Re-evaluated prior to induction Oxygen Delivery Method: Circle system utilized Preoxygenation: Pre-oxygenation with 100% oxygen Induction Type: IV induction LMA: LMA inserted LMA Size: 5.0 Number of attempts: 1 Placement Confirmation: positive ETCO2, CO2 detector and breath sounds checked- equal and bilateral Tube secured with: Tape Dental Injury: Teeth and Oropharynx as per pre-operative assessment

## 2022-08-13 NOTE — Discharge Instructions (Signed)
1 - You may have urinary urgency (bladder spasms) and bloody urine on / off with stent in place. This is normal. ° °2 - Call MD or go to ER for fever >102, severe pain / nausea / vomiting not relieved by medications, or acute change in medical status ° °

## 2022-08-13 NOTE — H&P (Signed)
William Good is an 67 y.o. male.    Chief Complaint: Pre-OP RIGHT Ureteroscopy / Possible Stone Manipulation, LEFT Stent placement  HPI:   1 - Left Atrophic Kidney / Recurrent Bacteruria - massively hydronephrotic left kidney (likely congeintial UPJO) on imagign x several includign 2024 with recurrent Klebsiella bacteruria (ses cipro). Renogram with 6% relative function. ?  ?2 - Right Renal Cyst / R/O UPJ Stone - atbou 6cm Rt renal cyst that extends into renal sinus. ? UPJ area calcification on CT 2024 that was not present 2022. This is his dominant functional kidney.   ??3- Prostate Screning - ?2024 - PSA 0.17 ?  ?4 - Stage 3b Renal Insuficiency - Cr 1.7's, GFR 40 at baselien. Atrophic left kdieny and long h/o DM, HTN. ??  5 - Lower Urinary Tract symptoms - on tamsulsoin at baseline. ?  ?PMH sig for colon cancer / resection (temporary colostomy, reversal 2023), very large turncal obesity, some developmental delay (does not drive, lilves by himself, makes own decisions, brother Jorja Loa very involved and lives close. His PCP is Henrine Screws MD. ?  ?Today "William Good" is seen to proceed with right ureteroscoy to r/o UPJ stone and left stent placement for decompression of massively hydronephrotic kidney. No interval fevers. Has been on Cipro proph pre-op according to most recent CX Willey Blade).    Past Medical History:  Diagnosis Date   Anginal pain (HCC)    Anxiety    Back pain    Bipolar disorder (HCC)    bipolar 1   CKD (chronic kidney disease) stage 2, GFR 60-89 ml/min 09/24/2020   Coronary artery disease    GERD (gastroesophageal reflux disease)    High output ileostomy (HCC) 05/29/2021   Hypertension    Knee pain    Migraine    Palpitation    Rectal cancer (HCC)    Type 2 diabetes mellitus with complication, without long-term current use of insulin (HCC) 03/19/2021   no meds    Past Surgical History:  Procedure Laterality Date   BACK SURGERY     ILEO LOOP DIVERSION N/A  09/03/2021   Procedure: OPEN TAKEDOWN OF LOOP ILEOSTOMY;  Surgeon: Karie Soda, MD;  Location: [facility];  Service: General;  Laterality: N/A;   JOINT REPLACEMENT Right    knee   OSTOMY N/A 05/27/2021   Procedure: POSSIBLE OSTOMY;  Surgeon: Karie Soda, MD;  Location[facility];  Service: General;  Laterality: N/A;   PORT-A-CATH REMOVAL Right 09/03/2021   Procedure: REMOVAL PORT-A-CATH;  Surgeon: Karie Soda, MD;  Location[facility];  Service: General;  Laterality: Right;   PORTACATH PLACEMENT N/A 10/02/2020   Procedure: PORT-A-CATH PLACEMENT;  Surgeon: Fritzi Mandes, MD;  Location: [facility];  Service: General;  Laterality: N/A;   PROCTOSCOPY N/A 05/27/2021   Procedure: RIGID PROCTOSCOPY;  Surgeon: Karie Soda, MD;  Location: [facility];  Service: General;  Laterality: N/A;   XI ROBOTIC ASSISTED LOWER ANTERIOR RESECTION N/A 05/27/2021   Procedure: ROBOTIC LOW ANTERIOR COLON RESECTION, VERY LOW COLOANAL ANASTOMOSIS, DIVERTING ILEOSTOMY, BILATERAL TAP BLOCK,  FIRFLY INJECTION;  Surgeon: Karie Soda, MD;  Location: [facility];  Service: General;  Laterality: N/A;    Family History  Problem Relation Age of Onset   ALS Mother    Heart disease Mother    Parkinson's disease Mother    Heart failure Father    Social History:  reports that he quit smoking about 15 months ago. His smoking use included cigarettes. He has a 22.50 pack-year smoking  history. He has never used smokeless tobacco. He reports that he does not drink alcohol and does not use drugs.  Allergies:  Allergies  Allergen Reactions   Nsaids Other (See Comments)    Chronic kidney disease - avoid NSAIDs     No medications prior to admission.    Results for orders placed or performed during the hospital encounter of 08/11/22 (from the past 48 hour(s))  Glucose, capillary     Status: Abnormal   Collection Time: 08/11/22  2:16 PM  Result Value Ref Range   Glucose-Capillary 185 (H) 70 - 99 mg/dL    Comment: Glucose reference range  applies only to samples taken after fasting for at least 8 hours.  Hemoglobin A1c per protocol     Status: Abnormal   Collection Time: 08/11/22  2:27 PM  Result Value Ref Range   Hgb A1c MFr Bld 7.4 (H) 4.8 - 5.6 %    Comment: (NOTE)         Prediabetes: 5.7 - 6.4         Diabetes: >6.4         Glycemic control for adults with diabetes: <7.0    Mean Plasma Glucose 166 mg/dL    Comment: (NOTE) Performed At: [facility] [address] [place], [place] [id number] Jolene Schimke MD WU:[id number]    No results found.  Review of Systems  Constitutional:  Negative for chills and fever.  All other systems reviewed and are negative.   There were no vitals taken for this visit. Physical Exam Vitals reviewed.  Constitutional:      Comments: Stable stigmata of chronic disease and some slow metnation. AOx3.   HENT:     Head: Normocephalic.  Eyes:     Pupils: Pupils are equal, round, and reactive to light.  Cardiovascular:     Rate and Rhythm: Normal rate.  Pulmonary:     Effort: Pulmonary effort is normal.  Abdominal:     General: Abdomen is flat.     Comments: Large truncal obesityu with prior scars, no hernias.   Genitourinary:    Comments: No CVAT at present Musculoskeletal:        General: Normal range of motion.     Cervical back: Normal range of motion.  Skin:    General: Skin is warm.  Neurological:     General: No focal deficit present.     Mental Status: He is alert.  Psychiatric:        Mood and Affect: Mood normal.      Assessment/Plan  Proceed as planned with Rt ureteroscopyk, Lt stent with goals of ruling out / treating stone in functionally dominant kidney and decompression of left kidney. Risks, beneftis, alternatives, expected peri-op course discussed previously and reiterated today.   Loletta Parish., MD 08/13/2022, 7:26 AM

## 2022-08-13 NOTE — Transfer of Care (Signed)
Immediate Anesthesia Transfer of Care Note  Patient: William Good  Procedure(s) Performed: CYSTOSCOPY WITH BILATERAL RETROGRADE PYELOGRAMS, RIGHT  URETEROSCOPY AND BILATERAL STENT PLACEMENT (Bilateral) HOLMIUM LASER APPLICATION (Right)  Patient Location: PACU  Anesthesia Type:General  Level of Consciousness: Report given to RN, Post -op Vital signs reviewed and stable, and Patient moving all extremities X 4  Airway & Oxygen Therapy: Patient Spontanous Breathing and Patient connected to face mask oxygen  Post-op Assessment: Report given to RN, Post -op Vital signs reviewed and stable, and Patient moving all extremities X 4  Post vital signs: Reviewed and stable  Last Vitals:  Vitals Value Taken Time  BP 155/80 08/13/22 1615  Temp 36.2 C 08/13/22 1615  Pulse 64 08/13/22 1616  Resp 19 08/13/22 1616  SpO2 98 % 08/13/22 1616  Vitals shown include unvalidated device data.  Last Pain:  Vitals:   08/13/22 1338  TempSrc:   PainSc: 0-No pain      Patients Stated Pain Goal: 4 (08/13/22 1338)  Complications: No notable events documented.

## 2022-08-13 NOTE — Telephone Encounter (Signed)
Calling back for update. Please advise  

## 2022-08-13 NOTE — Op Note (Signed)
NAME: William Good, William Good MEDICAL RECORD NO: [id number] ACCOUNT NO: [id number] DATE OF BIRTH: 09/29/1955 FACILITY: [facility] LOCATION: [facility] PHYSICIAN: Sebastian Ache, MD  Operative Report   DATE OF PROCEDURE: 08/13/2022  PREOPERATIVE DIAGNOSIS:  Possible right renal stone and functionally solitary kidney, massive left hydronephrosis, atrophic left kidney with recurrent urinary tract infections.  PROCEDURE PERFORMED:   1.  Cystoscopy with bilateral retrograde pyelograms interpretation. 2. Insertion of bilateral ureteral stents. 3.  Right ureteroscopy with laser lithotripsy.  FINDINGS: 1.  Massively hydronephrotic left kidney with mass effect on the ureter, left UPJ crossing midline to the right side. 2.  Successful placement of left ureteral stent, proximal end assumed to be in the renal pelvis, distal end in urinary bladder. 3.  Intrarenal mass effect from right renal cyst. 4.  Right UPJ area stone. 5.  Complete resolution of all accessible stone fragments larger than one third mm following laser lithotripsy and basket extraction on the right side. 6.  Successful placement of right ureteral stent, proximal end in upper pole, distal end in the urinary bladder.  INDICATIONS:  The patient is a pleasant, but quite comorbid 67 year old man with history of colon cancer, status post segmental resection and colostomy, later colostomy takedown, morbid obesity with very marginal functional capacity who does not drive  and lives with his brother.  He was found on workup of recurrent urinary tract infections to have a massively hydronephrotic left kidney.  Renogram confirmed essentially no function.  He does have recurrent coronal cystitis.  This is assumed to be  possible nidus. Most recent imaging is also noted to have what I feel could very well be a right renal stone in essentially his functionally dominant right kidney.  Options were discussed for management including very aggressive path with  left  nephrectomy, right ureteroscopy versus trial of stenting on his left side and right ureteroscopy to see if decompression alone would alleviate some of the recurrent symptomatology as he has had a prolonged recovery, including skilled nursing facility.   This is after prior abdominal surgery as he exceptionally poor operative candidate for major surgery.  The patient and family agreed that the endoscopic approach would be most prudent given risks, benefit profile.  He presents for this today again with  right ureteroscopy to rule out stone and if so to stone free and left stenting.  Informed consent was obtained and placed in medical record.  PROCEDURE IN DETAIL:  The patient being identified and verified. Procedure being right ureteroscopic stone manipulation, bilateral stenting was confirmed.  Procedure timeout was performed.  Intravenous antibiotics were administered.  General anesthesia  was induced.  The patient was placed into a low lithotomy position.  Sterile field was created, prepped and draped the patient's penis, perineum, and proximal thighs using iodine.  Cystourethroscopy was performed using 21-French rigid cystoscope with  offset lens. Inspection of the anterior and posterior urethra was unremarkable.  Inspection of urinary bladder revealed bilateral single ureteral orifices.  The right ureteral orifice was cannulated with 6-French end-hole catheter, and right retrograde  pyelogram was obtained.  Right retrograde pyelogram demonstrated single right ureter, single system right kidney.  There is significant mass effect from a right renal cyst without hydronephrosis.  There was a mobile filling defect consistent with likely intraluminal stone.  The  ZIPwire was advanced lower pole, set aside as a safety wire.  Next, left retrograde pyelogram was obtained.  Left retrograde pyelogram demonstrated single left ureter with significant mass effect of the left proximal  ureter crossing the  midline to the right side. There was what appeared to be a massively hydronephrotic kidney.  However, given the size of the  kidney filling with contrast to delineate detail anatomy was not possible.  A sensor wire was advanced to the level of the presumed very large renal pelvis and a 6 x 28 contour type stent was then carefully placed.  There was significant hydronephrotic drip  following placement of the distal end likely corroborating successful placement above the area of UPJ obstruction on the left side.  Next, semirigid ureteroscopy performed of the distal two-thirds of right ureter set alongside a separate sensor working  wire.  No mucosal abnormalities were found.  Semirigid scope was exchanged for a medium length ureteral access sheath to the level of the proximal ureter using continuous fluoroscopic guidance and flexible digital ureteroscopy was performed of the  proximal right ureter and systematic inspection of the right kidney there was significant mass effect from the right cyst, however, that do not appear to be overtly obstructing and as anticipated, there was a mobile stone on the right side.  This was  repositioned into an upper pole calyx, which was quite elongated.  The stone was much too large for simple basketing.  It was estimated to be approximately 8-9 mm.  Holmium laser energy then applied to stone using escalating settings up to 0.2 joules and  30 Hz and using a dusting technique, approximately 90% of the stone was dusted. The remaining fragments being amenable to simple basketing with Escape basket and representative fragments were removed, set aside for composition analysis such that all  fragments larger than one-third mm had been evacuated. Intrarenal anatomy was quite difficult to navigate given the mass effect of the cyst however, I was quite happy with the ablation of the stone.  Access sheath was removed under continuous vision, no  significant mucosal abnormalities were  found and a new 6 x 26 contour type stent was placed on the right side using fluoroscopic guidance with acceptable proximal and distal planes were noted.  Procedure was terminated.  The patient tolerated the  procedure well, no immediate perioperative complications.  The patient was taken to postanesthesia care in stable condition.  Plan for discharge home.  I will plan to take him back to the operating room in approximately 3 months for left stent change, repeat right ureteroscopy to confirm stone free before removing his right stent.   PUS D: 08/13/2022 4:07:01 pm T: 08/13/2022 7:57:00 pm  JOB: [id number]/ [id number]

## 2022-08-15 NOTE — Anesthesia Postprocedure Evaluation (Signed)
Anesthesia Post Note  Patient: William Good  Procedure(s) Performed: CYSTOSCOPY WITH BILATERAL RETROGRADE PYELOGRAMS, RIGHT  URETEROSCOPY AND BILATERAL STENT PLACEMENT (Bilateral) HOLMIUM LASER APPLICATION (Right)     Patient location during evaluation: PACU Anesthesia Type: General Level of consciousness: awake Pain management: pain level controlled Vital Signs Assessment: post-procedure vital signs reviewed and stable Respiratory status: spontaneous breathing, nonlabored ventilation and respiratory function stable Cardiovascular status: blood pressure returned to baseline and stable Postop Assessment: no apparent nausea or vomiting Anesthetic complications: no   No notable events documented.  Last Vitals:  Vitals:   08/13/22 1745 08/13/22 1815  BP: (!) 160/96 (!) 175/95  Pulse: 67 (!) 59  Resp: 18 20  Temp:  (!) 36.2 C  SpO2: 95% 99%    Last Pain:  Vitals:   08/13/22 1815  TempSrc:   PainSc: 3                  [name] P Alquan Morrish

## 2022-08-16 ENCOUNTER — Encounter (HOSPITAL_COMMUNITY): Payer: Self-pay | Admitting: Urology

## 2022-08-31 DIAGNOSIS — N130 Hydronephrosis with ureteropelvic junction obstruction: Secondary | ICD-10-CM | POA: Diagnosis not present

## 2022-08-31 DIAGNOSIS — N3020 Other chronic cystitis without hematuria: Secondary | ICD-10-CM | POA: Diagnosis not present

## 2022-08-31 DIAGNOSIS — N270 Small kidney, unilateral: Secondary | ICD-10-CM | POA: Diagnosis not present

## 2022-08-31 DIAGNOSIS — N202 Calculus of kidney with calculus of ureter: Secondary | ICD-10-CM | POA: Diagnosis not present

## 2022-09-01 ENCOUNTER — Other Ambulatory Visit: Payer: Self-pay | Admitting: Urology

## 2022-09-03 DIAGNOSIS — E119 Type 2 diabetes mellitus without complications: Secondary | ICD-10-CM | POA: Diagnosis not present

## 2022-09-03 DIAGNOSIS — E1159 Type 2 diabetes mellitus with other circulatory complications: Secondary | ICD-10-CM | POA: Diagnosis not present

## 2022-09-03 DIAGNOSIS — I422 Other hypertrophic cardiomyopathy: Secondary | ICD-10-CM | POA: Diagnosis not present

## 2022-09-03 DIAGNOSIS — I10 Essential (primary) hypertension: Secondary | ICD-10-CM | POA: Diagnosis not present

## 2022-09-03 DIAGNOSIS — C20 Malignant neoplasm of rectum: Secondary | ICD-10-CM | POA: Diagnosis not present

## 2022-09-13 ENCOUNTER — Encounter: Payer: Self-pay | Admitting: Oncology

## 2022-09-23 DIAGNOSIS — M79676 Pain in unspecified toe(s): Secondary | ICD-10-CM | POA: Diagnosis not present

## 2022-09-23 DIAGNOSIS — B351 Tinea unguium: Secondary | ICD-10-CM | POA: Diagnosis not present

## 2022-09-24 DIAGNOSIS — E119 Type 2 diabetes mellitus without complications: Secondary | ICD-10-CM | POA: Diagnosis not present

## 2022-10-21 ENCOUNTER — Inpatient Hospital Stay: Payer: Medicare Other | Attending: Oncology

## 2022-10-21 ENCOUNTER — Inpatient Hospital Stay: Payer: Medicare Other

## 2022-10-21 ENCOUNTER — Inpatient Hospital Stay: Payer: Medicare Other | Admitting: Oncology

## 2022-10-21 VITALS — BP 138/80 | HR 77 | Temp 98.1°F | Resp 18 | Ht 72.0 in | Wt 263.0 lb

## 2022-10-21 DIAGNOSIS — E1122 Type 2 diabetes mellitus with diabetic chronic kidney disease: Secondary | ICD-10-CM | POA: Diagnosis not present

## 2022-10-21 DIAGNOSIS — G620 Drug-induced polyneuropathy: Secondary | ICD-10-CM | POA: Diagnosis not present

## 2022-10-21 DIAGNOSIS — I129 Hypertensive chronic kidney disease with stage 1 through stage 4 chronic kidney disease, or unspecified chronic kidney disease: Secondary | ICD-10-CM | POA: Diagnosis not present

## 2022-10-21 DIAGNOSIS — C20 Malignant neoplasm of rectum: Secondary | ICD-10-CM | POA: Diagnosis not present

## 2022-10-21 DIAGNOSIS — G2581 Restless legs syndrome: Secondary | ICD-10-CM | POA: Insufficient documentation

## 2022-10-21 DIAGNOSIS — N132 Hydronephrosis with renal and ureteral calculous obstruction: Secondary | ICD-10-CM | POA: Diagnosis not present

## 2022-10-21 DIAGNOSIS — F319 Bipolar disorder, unspecified: Secondary | ICD-10-CM | POA: Diagnosis not present

## 2022-10-21 DIAGNOSIS — N189 Chronic kidney disease, unspecified: Secondary | ICD-10-CM | POA: Insufficient documentation

## 2022-10-21 DIAGNOSIS — R197 Diarrhea, unspecified: Secondary | ICD-10-CM | POA: Diagnosis not present

## 2022-10-21 LAB — CEA (ACCESS): CEA (CHCC): 1.69 ng/mL (ref 0.00–5.00)

## 2022-10-21 NOTE — Progress Notes (Signed)
[facility] OFFICE PROGRESS NOTE   Diagnosis: Rectal cancer  INTERVAL HISTORY:   William Good returns as scheduled.  He continues to have irregular bowel habits.  He has multiple bowel movements per day.  The bowel movements are sometimes formed.  He has rectal urgency and occasional incontinence.  He reports intentional weight loss.  No neuropathy symptoms. He is followed by Dr. Urban Gibson for the chronically obstructed left kidney.  He reports a ureter stent is in place. Objective:  Vital signs in last 24 hours:  Blood pressure 138/80, pulse 77, temperature 98.1 F (36.7 C), temperature source Oral, resp. rate 18, height 6' (1.829 m), weight 263 lb (119.3 kg), SpO2 98%.    Lymphatics: No cervical, supraclavicular, axillary, or inguinal nodes Resp: Distant breath sounds, no respiratory distress Cardio: Distant heart sounds, regular rhythm GI: No hepatomegaly, mass occupying the majority the left abdomen with mild tenderness at the left lower abdomen Vascular: No leg edema   Lab Results:  Lab Results  Component Value Date   WBC 4.9 07/21/2022   HGB 11.3 (L) 07/21/2022   HCT 37.8 (L) 07/21/2022   MCV 82.5 07/21/2022   PLT 182 07/21/2022   NEUTROABS 3.4 07/21/2022    CMP  Lab Results  Component Value Date   NA 136 07/21/2022   K 4.3 07/21/2022   CL 99 07/21/2022   CO2 28 07/21/2022   GLUCOSE 174 (H) 07/21/2022   BUN 12 07/21/2022   CREATININE 1.75 (H) 07/21/2022   CALCIUM 8.9 07/21/2022   PROT 6.5 07/21/2022   ALBUMIN 4.0 07/21/2022   AST 14 (L) 07/21/2022   ALT 11 07/21/2022   ALKPHOS 91 07/21/2022   BILITOT 0.3 07/21/2022   GFRNONAA 42 (L) 07/21/2022   GFRAA >60 01/17/2017    Lab Results  Component Value Date   CEA 1.69 10/21/2022     Medications: I have reviewed the patient's current medications.   Assessment/Plan: Rectal cancer Mass at 6 cm on digital exam/anoscopy 08/25/2020 Colonoscopy 09/05/2020-mid rectal mass-biopsy, adenocarcinoma,  no loss of mismatch repair protein expression CT abdomen/pelvis 08/29/2020-small amount of free fluid in the pelvis, mildly enlarged left external iliac node, cystic changes of the left kidney with hydronephrosis and regions of cortical thinning with transition at the left UPJ suggesting chronic left UPJ obstruction MRI pelvis 09/24/2020-tumor 6 cm from the anal verge, 1.8 cm from the internal anal sphincter, T2, 7 mm lymph node in the left mesorectal sheath, N1 Cycle 1 FOLFOX 10/08/2020 Cycle 2 FOLFOX 10/22/2020 Cycle 3 FOLFOX 11/12/2020 (oxaliplatin dose reduced, white cell growth factor support on day of pump discontinuation) Cycle 4 FOLFOX 11/26/2020, 5-FU bolus and infusion dose reduced secondary to diarrhea, Ziextenzo Cycle 5 FOLFOX 12/10/2020, 5-FU bolus held due to diarrhea, Ziextenzo Cycle 6 FOLFOX 12/24/2020, 5-FU bolus held, Ziextenzo Cycle 7 FOLFOX 01/07/2021, oxaliplatin held due to neuropathy, 5-FU bolus held, Ziextenzo held Cycle 8 FOLFOX 01/21/2021, oxaliplatin held due to neuropathy, 5-FU bolus held, Ziextenzo held Radiation/Xeloda 02/02/2021-03/16/2021 Low anterior resection/diverting loop ileostomy 05/27/2021, no residual carcinoma, 0/19 nodes, negative resection margins, [id number],ypN0 Ileostomy takedown 09/03/2021 CTs 05/31/2022-surgical changes noted of distal colonic resection and primary anastomosis.  Maturing areas of surgical change involving small bowel in the right side of the anterior abdominal/pelvic wall.  Previous abnormal nodes in the left pelvic sidewall are improved.  Nodes in the upper retroperitoneum appear slightly smaller.  Massively enlarged left kidney.  Persistent large right-sided parapelvic renal cyst with nonobstructing renal stones. Colonoscopy 07/16/2022-polyps removed from the ascending  and transverse colon-tubular adenomas     Diabetes Chronic kidney disease Bipolar disorder Hypertension Hyperlipidemia Restless leg syndrome Multiple colon polyps removed on the  colonoscopy 09/05/2020-tubular adenomas including a rectal polyp with high-grade dysplasia-polypectomy margin negative for dysplasia Portacath placement, Dr Freida Busman, 10/02/2020 Oxaliplatin neuropathy Diarrhea following ileostomy takedown procedure.  Improved with Lomotil. Palpable left abdominal mass on exam 05/28/2022-massively enlarged left kidney; referred to urology Cystoscopy with retrograde pyelograms, placement of bilateral ureter stents, laser lithotripsy for treatment of a right UPJ stone, massively hydronephrotic left kidney   Disposition: William William Good is in clinical remission from rectal cancer.  He will return for an office visit and CEA in 6 months.  He will continue follow-up with Dr. Berneice Heinrich for management of the hydronephrotic left kidney and bilateral ureter stents.    Thornton Papas, MD  10/21/2022  3:20 PM

## 2022-10-27 ENCOUNTER — Ambulatory Visit: Payer: Medicare Other | Admitting: Genetic Counselor

## 2022-10-27 ENCOUNTER — Ambulatory Visit: Payer: Medicare Other | Admitting: Internal Medicine

## 2022-10-27 ENCOUNTER — Encounter: Payer: Self-pay | Admitting: Internal Medicine

## 2022-10-27 VITALS — BP 118/80 | HR 70 | Ht 72.0 in | Wt 261.0 lb

## 2022-10-27 DIAGNOSIS — D509 Iron deficiency anemia, unspecified: Secondary | ICD-10-CM | POA: Diagnosis not present

## 2022-10-27 DIAGNOSIS — R5383 Other fatigue: Secondary | ICD-10-CM

## 2022-10-27 DIAGNOSIS — J449 Chronic obstructive pulmonary disease, unspecified: Secondary | ICD-10-CM | POA: Diagnosis not present

## 2022-10-27 DIAGNOSIS — I422 Other hypertrophic cardiomyopathy: Secondary | ICD-10-CM

## 2022-10-27 NOTE — Patient Instructions (Addendum)
Medication Instructions:  Your physician recommends that you continue on your current medications as directed. Please refer to the Current Medication list given to you today.  *If you need a refill on your cardiac medications before your next appointment, please call your pharmacy*   Lab Work: CBC, anemia panel, BMP, BNP  If you have labs (blood work) drawn today and your tests are completely normal, you will receive your results only by: MyChart Message (if you have MyChart) OR A paper copy in the mail If you have any lab test that is abnormal or we need to change your treatment, we will call you to review the results.   Testing/Procedures: Dr. Izora Ribas recommended that you have a Stress echocardiogram, Pulmonary function test and a sleep study.  If you change your mind regarding having test completed please contact our office.    Follow-Up: At [facility], you and your health needs are our priority.  As part of our continuing mission to provide you with exceptional heart care, we have created designated Provider Care Teams.  These Care Teams include your primary Cardiologist (physician) and Advanced Practice Providers (APPs -  Physician Assistants and Nurse Practitioners) who all work together to provide you with the care you need, when you need it.    Your next appointment:   5-6 month(s)  Provider:   Riley Lam, MD

## 2022-10-27 NOTE — Progress Notes (Signed)
Cardiology Office Note:    Date:  10/27/2022   ID:  William Good [birth year], MRN [id number]  PCP:  Henrine Screws, MD   [facility] Providers Cardiologist:  None     Referring MD: Rollene Rotunda, MD   CC: Feels poorly Consulted for the evaluation of HCM at the behest of Dr. Antoine Poche  History of Present Illness:    William Good is a 67 y.o. male with a hx of CAD with HLD, HTN with DM, rectal cancer with prior Folfox.  Found to have apical hypertrophic cardiomyopathy with apical aneurysm and no thrombus. Morbid obesity.    Patient notes significant weakness. Is fatigued all the time. The cancer surgery took a lot of out him. Lives by himself.    Does ADLs by himself.  He is largely sedentary.  This is true even before cancer surgery.  Patient notes chronic SOB with minimal activity; he can go to the grocery store and talking the trash out to the curb. Notes daily fatigue. Post surgery he weight 200 lbs; he now weights 261. Notes palpitations. Notes mechanical falls.  Smoked from 67 years old to 67 years old.  Notes no CP. Notes Dizziness when standing. Notes no syncope. Has leg swelling when he wears shoes for a while.   Notable family events include two sons, brother and two sisters..  Past Medical History:  Diagnosis Date   Anginal pain (HCC)    Anxiety    Back pain    Bipolar disorder (HCC)    bipolar 1   CKD (chronic kidney disease) stage 2, GFR 60-89 ml/min 09/24/2020   Coronary artery disease    GERD (gastroesophageal reflux disease)    High output ileostomy (HCC) 05/29/2021   Hypertension    Knee pain    Migraine    Palpitation    Rectal cancer (HCC)    Type 2 diabetes mellitus with complication, without long-term current use of insulin (HCC) 03/19/2021   no meds    Past Surgical History:  Procedure Laterality Date   BACK SURGERY     CYSTOSCOPY WITH RETROGRADE PYELOGRAM, URETEROSCOPY AND STENT PLACEMENT Bilateral  08/13/2022   Procedure: CYSTOSCOPY WITH BILATERAL RETROGRADE PYELOGRAMS, RIGHT  URETEROSCOPY AND BILATERAL STENT PLACEMENT;  Surgeon: Loletta Parish., MD;  Location: [facility];  Service: Urology;  Laterality: Bilateral;  75 MINS   HOLMIUM LASER APPLICATION Right 08/13/2022   Procedure: HOLMIUM LASER APPLICATION;  Surgeon: Loletta Parish., MD;  Location: [facility];  Service: Urology;  Laterality: Right;   ILEO LOOP DIVERSION N/A 09/03/2021   Procedure: OPEN TAKEDOWN OF LOOP ILEOSTOMY;  Surgeon: Karie Soda, MD;  Location: [facility];  Service: General;  Laterality: N/A;   JOINT REPLACEMENT Right    knee   OSTOMY N/A 05/27/2021   Procedure: POSSIBLE OSTOMY;  Surgeon: Karie Soda, MD;  Location: [facility];  Service: General;  Laterality: N/A;   PORT-A-CATH REMOVAL Right 09/03/2021   Procedure: REMOVAL PORT-A-CATH;  Surgeon: Karie Soda, MD;  Location: [facility];  Service: General;  Laterality: Right;   PORTACATH PLACEMENT N/A 10/02/2020   Procedure: PORT-A-CATH PLACEMENT;  Surgeon: Fritzi Mandes, MD;  Location: [facility];  Service: General;  Laterality: N/A;   PROCTOSCOPY N/A 05/27/2021   Procedure: RIGID PROCTOSCOPY;  Surgeon: Karie Soda, MD;  Location: [facility];  Service: General;  Laterality: N/A;   XI ROBOTIC ASSISTED LOWER ANTERIOR RESECTION N/A 05/27/2021   Procedure: ROBOTIC LOW ANTERIOR COLON RESECTION, VERY LOW COLOANAL ANASTOMOSIS,  DIVERTING ILEOSTOMY, BILATERAL TAP BLOCK,  FIRFLY INJECTION;  Surgeon: Karie Soda, MD;  Location: [facility];  Service: General;  Laterality: N/A;    Current Medications: Current Meds  Medication Sig   acetaminophen (TYLENOL) 325 MG tablet Take 650 mg by mouth every 6 (six) hours as needed for mild pain.   ALPRAZolam (XANAX) 1 MG tablet Take 1 tablet (1 mg total) by mouth 2 (two) times daily. Every morning as needed for anxiety, nerves, and behavior   aspirin EC 81 MG tablet Take 81 mg by mouth daily. Swallow whole.   Cholecalciferol (VITAMIN D3) 50 MCG (2000 UT)  capsule Take 2,000 Units by mouth daily.   diphenoxylate-atropine (LOMOTIL) 2.5-0.025 MG tablet Take by mouth as needed for diarrhea or loose stools.   glipiZIDE (GLUCOTROL XL) 5 MG 24 hr tablet Take 5 mg by mouth daily.   mirtazapine (REMERON) 45 MG tablet Take 1 tablet (45 mg total) by mouth at bedtime.   omeprazole (PRILOSEC) 20 MG capsule Take 1 capsule (20 mg total) 2 (two) times daily by mouth.   pravastatin (PRAVACHOL) 40 MG tablet Take 1 tablet (40 mg total) by mouth daily.   propranolol ER (INDERAL LA) 160 MG SR capsule Take 1 capsule (160 mg total) by mouth daily.   SUMAtriptan (IMITREX) 100 MG tablet Take 1 tablet (100 mg total) by mouth 2 (two) times daily as needed for migraine.   tamsulosin (FLOMAX) 0.4 MG CAPS capsule Take 1 capsule (0.4 mg total) by mouth at bedtime.   traZODone (DESYREL) 50 MG tablet Take 50 mg by mouth at bedtime as needed for sleep.     Allergies:   Nsaids   Social History   Socioeconomic History   Marital status: Divorced    Spouse name: Not on file   Number of children: Not on file   Years of education: Not on file   Highest education level: Not on file  Occupational History   Not on file  Tobacco Use   Smoking status: Former    Current packs/day: 0.00    Average packs/day: 0.5 packs/day for 45.0 years (22.5 ttl pk-yrs)    Types: Cigarettes    Start date: 04/22/1976    Quit date: 04/22/2021    Years since quitting: 1.5   Smokeless tobacco: Never  Vaping Use   Vaping status: Never Used  Substance and Sexual Activity   Alcohol use: No   Drug use: No   Sexual activity: Not Currently  Other Topics Concern   Not on file  Social History Narrative   Not on file   Social Determinants of Health   Financial Resource Strain: Not on file  Food Insecurity: Not on file  Transportation Needs: Not on file  Physical Activity: Not on file  Stress: Not on file  Social Connections: Not on file     Family History: The patient's family history  includes ALS in his mother; Heart disease in his mother; Heart failure in his father; Parkinson's disease in his mother.  ROS:   Please see the history of present illness.     EKGs/Labs/Other Studies Reviewed:    The following studies were reviewed today:  Cardiac Studies & Procedures       ECHOCARDIOGRAM  ECHOCARDIOGRAM COMPLETE 04/01/2021  Narrative ECHOCARDIOGRAM REPORT    Patient Name:   GABOR GAUNA Date of Exam: 04/01/2021 Medical Rec #:  [id number]           Height:       72.0  in Accession #:    [id number]          Weight:       225.6 lb Date of Birth:  1955/12/04          BSA:          2.242 m Patient Age:    65 years            BP:           96/64 mmHg Patient Gender: M                   HR:           62 bpm. Exam Location:  [address]  Procedure: 2D Echo, Cardiac Doppler, Color Doppler and Intracardiac Opacification Agent  Indications:    R94.31 Abnormal EKG  History:        Patient has no prior history of Echocardiogram examinations. Abnormal ECG, Signs/Symptoms:Chest Pain and Shortness of Breath; Risk Factors:Family History of Coronary Artery Disease, Hypertension, Diabetes and Current Smoker. Colon Cancer, Radiation, Pre-Operative Evaluation for Colon Resection.  Sonographer:    Farrel Conners RDCS Referring Phys: Rollene Rotunda  IMPRESSIONS   1. There is severe asymmetric hypertrophy of the apical segments (up to 18 mm) consistent with apical variant hypertrophic cardiomyopathy. There is concern for an apical aneurysm on this study, however this was a difficult study. Would recommend cardiac MRI for better characterization. Left ventricular ejection fraction, by estimation, is 55 to 60%. The left ventricle has normal function. The left ventricle has no regional wall motion abnormalities. There is severe asymmetric left ventricular hypertrophy of the apical segment. Left ventricular diastolic parameters are consistent with Grade I diastolic  dysfunction (impaired relaxation). 2. Right ventricular systolic function is normal. The right ventricular size is normal. Tricuspid regurgitation signal is inadequate for assessing PA pressure. 3. The mitral valve is grossly normal. Trivial mitral valve regurgitation. No evidence of mitral stenosis. 4. The aortic valve is grossly normal. Aortic valve regurgitation is not visualized. No aortic stenosis is present. 5. The inferior vena cava is normal in size with greater than 50% respiratory variability, suggesting right atrial pressure of 3 mmHg.  FINDINGS Left Ventricle: There is severe asymmetric hypertrophy of the apical segments (up to 18 mm) consistent with apical variant hypertrophic cardiomyopathy. There is concern for an apical aneurysm on this study, however this was a difficult study. Would recommend cardiac MRI for better characterization. Left ventricular ejection fraction, by estimation, is 55 to 60%. The left ventricle has normal function. The left ventricle has no regional wall motion abnormalities. Definity contrast agent was given IV to delineate the left ventricular endocardial borders. The left ventricular internal cavity size was normal in size. There is severe asymmetric left ventricular hypertrophy of the apical segment. Left ventricular diastolic parameters are consistent with Grade I diastolic dysfunction (impaired relaxation).  Right Ventricle: The right ventricular size is normal. No increase in right ventricular wall thickness. Right ventricular systolic function is normal. Tricuspid regurgitation signal is inadequate for assessing PA pressure.  Left Atrium: Left atrial size was normal in size.  Right Atrium: Right atrial size was normal in size.  Pericardium: There is no evidence of pericardial effusion.  Mitral Valve: The mitral valve is grossly normal. Trivial mitral valve regurgitation. No evidence of mitral valve stenosis.  Tricuspid Valve: The tricuspid valve is  grossly normal. Tricuspid valve regurgitation is trivial. No evidence of tricuspid stenosis.  Aortic Valve: The aortic valve is grossly normal. Aortic  valve regurgitation is not visualized. No aortic stenosis is present.  Pulmonic Valve: The pulmonic valve was grossly normal. Pulmonic valve regurgitation is not visualized. No evidence of pulmonic stenosis.  Aorta: The aortic root and ascending aorta are structurally normal, with no evidence of dilitation.  Venous: The inferior vena cava is normal in size with greater than 50% respiratory variability, suggesting right atrial pressure of 3 mmHg.  IAS/Shunts: The atrial septum is grossly normal.   LEFT VENTRICLE PLAX 2D LVIDd:         4.40 cm   Diastology LVIDs:         2.60 cm   LV e' medial:    6.64 cm/s LV PW:         1.40 cm   LV E/e' medial:  8.8 LV IVS:        2.05 cm   LV e' lateral:   7.62 cm/s LVOT diam:     2.10 cm   LV E/e' lateral: 7.7 LV SV:         71 LV SV Index:   32 LVOT Area:     3.46 cm   RIGHT VENTRICLE RV S prime:     10.90 cm/s TAPSE (M-mode): 2.0 cm  LEFT ATRIUM             Index        RIGHT ATRIUM           Index LA diam:        4.70 cm 2.10 cm/m   RA Area:     15.10 cm LA Vol (A2C):   36.0 ml 16.05 ml/m  RA Volume:   37.10 ml  16.54 ml/m LA Vol (A4C):   62.1 ml 27.69 ml/m LA Biplane Vol: 51.8 ml 23.10 ml/m AORTIC VALVE LVOT Vmax:   110.00 cm/s LVOT Vmean:  68.950 cm/s LVOT VTI:    0.204 m  AORTA Ao Root diam: 3.00 cm Ao Asc diam:  3.50 cm  MITRAL VALVE MV Area (PHT): cm         SHUNTS MV Decel Time: 201 msec    Systemic VTI:  0.20 m MV E velocity: 58.70 cm/s  Systemic Diam: 2.10 cm MV A velocity: 72.60 cm/s MV E/A ratio:  0.81  Lennie Odor MD Electronically signed by Lennie Odor MD Signature Date/Time: 04/01/2021/3:19:15 PM    Final    MONITORS  LONG TERM MONITOR (3-14 DAYS) 01/18/2022  Narrative Normal sinus rhythm One run of SVT lasting 4 beats Rare ventricular  ectopy No sustained arrhythmias    CARDIAC MRI  MR CARDIAC MORPHOLOGY W WO CONTRAST 01/20/2022  Narrative CLINICAL DATA:  Evaluate for HCM  EXAM: CARDIAC MRI  TECHNIQUE: The patient was scanned on a 1.5 Tesla Siemens magnet. A dedicated cardiac coil was used. Functional imaging was done using Fiesta sequences. 2,3, and 4 chamber views were done to assess for RWMA's. Modified Simpson's rule using a short axis stack was used to calculate an ejection fraction on a dedicated work [employer]. The patient received 15 cc of Gadavist. After 10 minutes inversion recovery sequences were used to assess for infiltration and scar tissue. Velocity flow mapping performed.  CONTRAST:  15 cc  of Gadavist  FINDINGS: 1.  Normal left ventricular size and systolic function (LVEF = 66%).  There is severe apical left ventricular hypertrophy, with apical walls measuring upto 1.9cm in max diameter.  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  There is late gadolinium enhancement in  the mid wall of the apical left ventricular myocardium (areas of maximal thickness).  LGE/scar tissue comprises about 5% of total myocardial mass.  Mild basal to mid LV wall thickness.  There is no LVOT obstruction.  LVEDV: 154 ml  LVESV: 52 ml  SV: 102 ml  CO: 4.5L/min  Myocardial mass: 181 g  2. Normal right ventricular size, thickness and systolic function (RVEF = 61%). There are no regional wall motion abnormalities.  3.  Normal left and right atrial size.  4. Normal size of the aortic root, ascending aorta and pulmonary artery. No evidence for cardiac shunting, Qp: Qs = 1.05  5.  No significant valvular abnormalities.  6.  Normal pericardium.  No pericardial effusion.  IMPRESSION: 1. Normal left ventricular size and systolic function.  LVEF = 66%).  2. There is severe apical left ventricular hypertrophy, with apical walls measuring upto 1.9 cm in max diameter.  3. There is a small  apical aneurysm.  4. There is LGE (scar) in the midwall of the apical left ventricular myocardium (areas of maximal thickness).  5. LGE/scar tissue comprises about 5% of total myocardial mass.  6. Mild basal to mid LV wall thickness, with no LVOT obstruction.  7. Findings consistent with hypertrophic cardiomyopathy (HCM), apical variant.   Electronically Signed By: Debbe Odea M.D. On: 01/20/2022 16:46               Recent Labs: 05/28/2022: Magnesium 1.8 07/21/2022: ALT 11; BUN 12; Creatinine 1.75; Hemoglobin 11.3; Platelet Count 182; Potassium 4.3; Sodium 136 08/06/2022: BNP 85.8  Recent Lipid Panel No results found for: "CHOL", "TRIG", "HDL", "CHOLHDL", "VLDL", "LDLCALC", "LDLDIRECT"   Risk Assessment/Calculations:   6 points STOP-BANG High Risk for moderate to severe OSA  Physical Exam:    VS:  BP 118/80   Pulse 70   Ht 6' (1.829 m)   Wt 261 lb (118.4 kg)   SpO2 97%   BMI 35.40 kg/m     Wt Readings from Last 3 Encounters:  10/27/22 261 lb (118.4 kg)  10/21/22 263 lb (119.3 kg)  08/13/22 271 lb (122.9 kg)     GEN: NAD, Appears older than stated age HEENT: Poor dentition NECK: No JVD CARDIAC: RRR, no murmurs, rubs, gallops; distant heart sounds RESPIRATORY:  Clear to auscultation without rales but with decreased air movement, no tachypnea no wheezing or rhonchi  ABDOMEN: Soft, non-tender, distended MUSCULOSKELETAL:  +1 l left leg edema; No deformity  SKIN: Warm and dry NEUROLOGIC:  Alert and oriented x 3, diminished health care literacy PSYCHIATRIC:  Normal affect   ASSESSMENT:    1. Hypertrophic cardiomyopathy (HCC)   2. Fatigue, unspecified type   3. Iron deficiency anemia, unspecified iron deficiency anemia type   4. Chronic obstructive pulmonary disease, unspecified COPD type (HCC)     PLAN:    Hypertrophic Cardiomyopathy - apical variant with aneurysm but without thrombus - no significant resting gradient - suspicion of  Fabry's/Danon or other mimics of HCM: low - Gene variant: Pending  - NYHA III - Non HCM Contributors to disease/status - Anemia, s/p prior rectal cancer; will get CBC and anemia panel - Morbid obesity with DM, he defers repeat A1c;  no history of MEN2a or medullary thyroid cancer,  pancreatitis or gallstones. ozempic or Mounjaro would be reasonable in the future, in discussion with him and his BIL, he would prefer to discuss eval for this with PCP - COPD- would recommend PFTs, they have deferred - STOPBANG of 6-  sleep study was offered; he has deferred - Deconditioning: discussed Mediterranean diet and improvement in symptoms in HCM care; he lives at home without the internet, did not improve with 2022 nutrition consult, he would not like to received health care coaching or nutrition support  Medication symptom plan - overall I suspect he will be non obstructive, offered stress echo, if significant LVOT gradient will consider CMI; patient declined - give comorbid condition, would not favor apical myectomy - he does not have access to the internet and needs a lot of support getting to visits, I would have hesitancy with sending him to [facility] for ACASIA trial; when mavacamten gets non obstructive indication we can attempt  Family history , Discussed family screening  (Sons, siblings need echo screening; he will see Dr. Jomarie Longs next)  SCD  Assessment - Increased risk of SCD in the setting of apical aneurysm - no NSVT on monitor, LGE ~ 5% - BIL has concerns that he would not be able to sleep next to the monitor or manage the device. - they have declined stress testing or EP consultation.  Atrial fibrillation (none found; only rare SVT on 2023 monitor) - Atrial arrhythmia management: no changes to therapy  Will see this winter, low threshold to order the above if within patient/family wishes  Time Spent Directly with Patient:   I have spent a total of 61 minutes with the patient  reviewing notes, imaging, EKGs, labs and examining the patient as well as establishing an assessment and plan that was discussed personally with the patient.  > 50% of time was spent in direct patient care and family and reviewing imaging with patient.       Medication Adjustments/Labs and Tests Ordered: Current medicines are reviewed at length with the patient today.  Concerns regarding medicines are outlined above.  Orders Placed This Encounter  Procedures   CBC   Anemia panel   Basic metabolic panel   Pro b natriuretic peptide (BNP)   Pulmonary Function Test   No orders of the defined types were placed in this encounter.   Patient Instructions  Medication Instructions:  Your physician recommends that you continue on your current medications as directed. Please refer to the Current Medication list given to you today.  *If you need a refill on your cardiac medications before your next appointment, please call your pharmacy*   Lab Work: CBC, anemia panel, BMP, BNP  If you have labs (blood work) drawn today and your tests are completely normal, you will receive your results only by: MyChart Message (if you have MyChart) OR A paper copy in the mail If you have any lab test that is abnormal or we need to change your treatment, we will call you to review the results.   Testing/Procedures: Dr. Izora Ribas recommended that you have a Stress echocardiogram, Pulmonary function test and a sleep study.  If you change your mind regarding having test completed please contact our office.    Follow-Up: At [facility], you and your health needs are our priority.  As part of our continuing mission to provide you with exceptional heart care, we have created designated Provider Care Teams.  These Care Teams include your primary Cardiologist (physician) and Advanced Practice Providers (APPs -  Physician Assistants and Nurse Practitioners) who all work together to provide you with the  care you need, when you need it.    Your next appointment:   5-6 month(s)  Provider:   Riley Lam, MD  Signed, Christell Constant, MD  10/27/2022 2:14 PM    [facility]

## 2022-10-28 LAB — BASIC METABOLIC PANEL
BUN/Creatinine Ratio: 10 (ref 10–24)
BUN: 18 mg/dL (ref 8–27)
CO2: 26 mmol/L (ref 20–29)
Calcium: 9.2 mg/dL (ref 8.6–10.2)
Chloride: 99 mmol/L (ref 96–106)
Creatinine, Ser: 1.89 mg/dL — ABNORMAL HIGH (ref 0.76–1.27)
Glucose: 138 mg/dL — ABNORMAL HIGH (ref 70–99)
Potassium: 4.8 mmol/L (ref 3.5–5.2)
Sodium: 139 mmol/L (ref 134–144)
eGFR: 38 mL/min/{1.73_m2} — ABNORMAL LOW (ref >59)

## 2022-10-28 LAB — CBC
Hemoglobin: 11.9 g/dL — ABNORMAL LOW (ref 13.0–17.7)
MCH: 25.2 pg — ABNORMAL LOW (ref 26.6–33.0)
MCHC: 30.7 g/dL — ABNORMAL LOW (ref 31.5–35.7)
MCV: 82 fL (ref 79–97)
Platelets: 191 10*3/uL (ref 150–450)
RBC: 4.73 x10E6/uL (ref 4.14–5.80)
RDW: 16.2 % — ABNORMAL HIGH (ref 11.6–15.4)
WBC: 7 10*3/uL (ref 3.4–10.8)

## 2022-10-28 LAB — ANEMIA PANEL
Ferritin: 25 ng/mL — ABNORMAL LOW (ref 30–400)
Folate, Hemolysate: 447 ng/mL
Folate, RBC: 1152 ng/mL (ref >498)
Hematocrit: 38.8 % (ref 37.5–51.0)
Iron Saturation: 15 % (ref 15–55)
Iron: 61 ug/dL (ref 38–169)
Retic Ct Pct: 2.3 % (ref 0.6–2.6)
Total Iron Binding Capacity: 402 ug/dL (ref 250–450)
UIBC: 341 ug/dL (ref 111–343)
Vitamin B-12: 319 pg/mL (ref 232–1245)

## 2022-10-28 LAB — PRO B NATRIURETIC PEPTIDE: NT-Pro BNP: 415 pg/mL — ABNORMAL HIGH (ref 0–376)

## 2022-10-30 IMAGING — US US RENAL
1 series · 15 of 25 positions shown · non-contrast
Comparison: CT 08/29/2020

CLINICAL DATA: 65-year-old male with a history acute kidney injury

EXAM:
RENAL / URINARY TRACT ULTRASOUND COMPLETE

[Series 1: us renal mc & wl · 29 acquisitions, 15 frames shown]
[im 1/29]
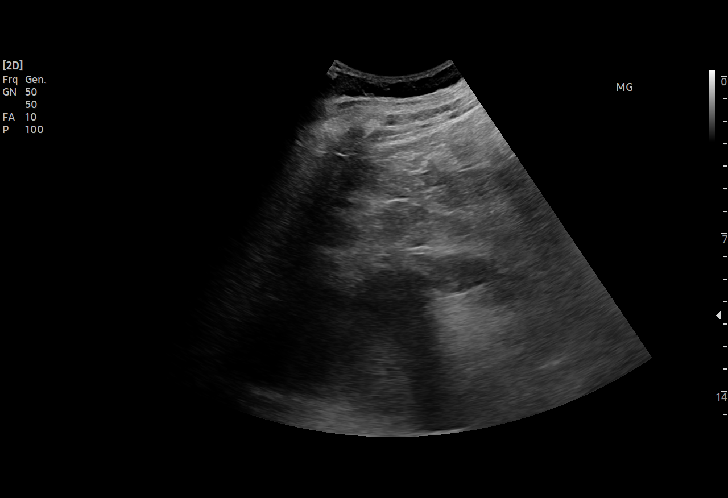
[im 3/29]
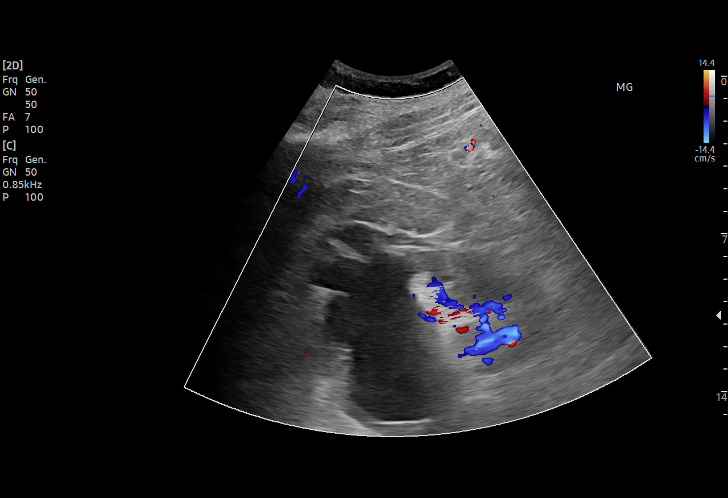
[im 5/29]
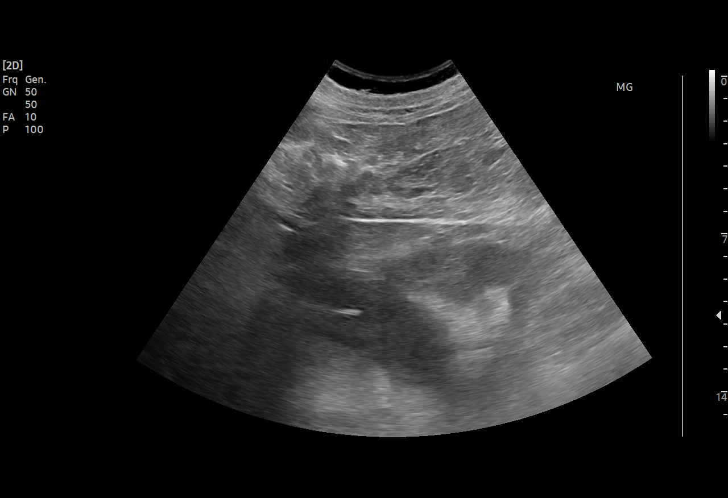
[im 6/29]
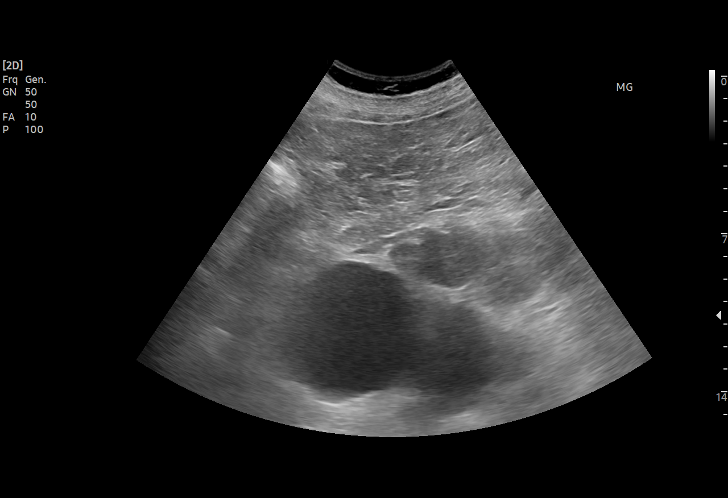
[im 9/29]
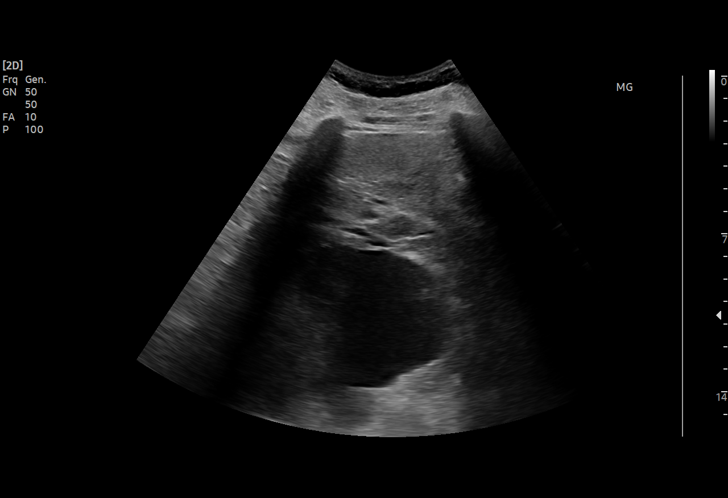
[im 11/29]
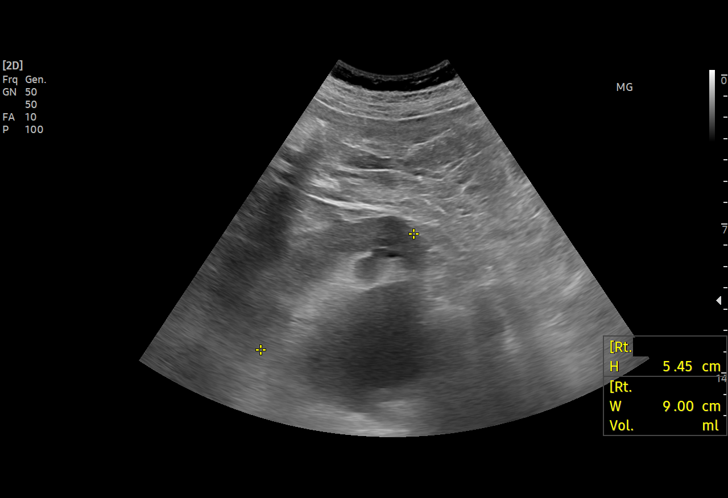
[im 12/29]
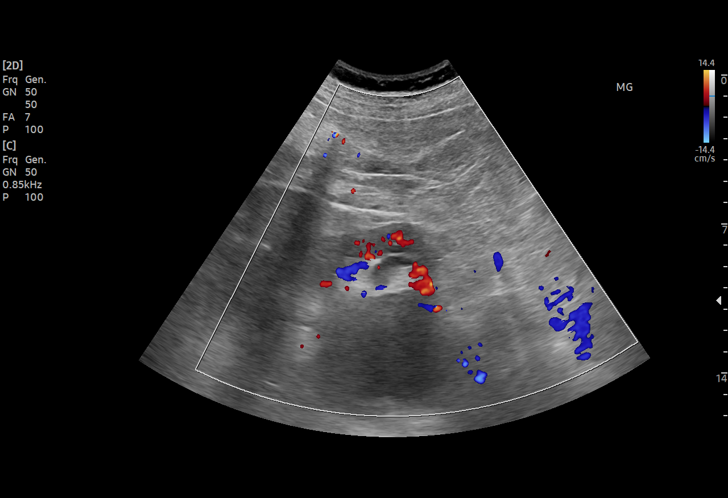
[im 15/29]
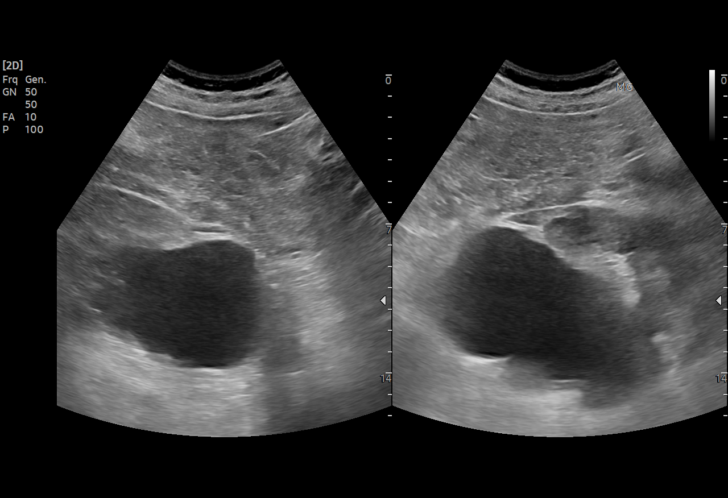
[im 17/29]
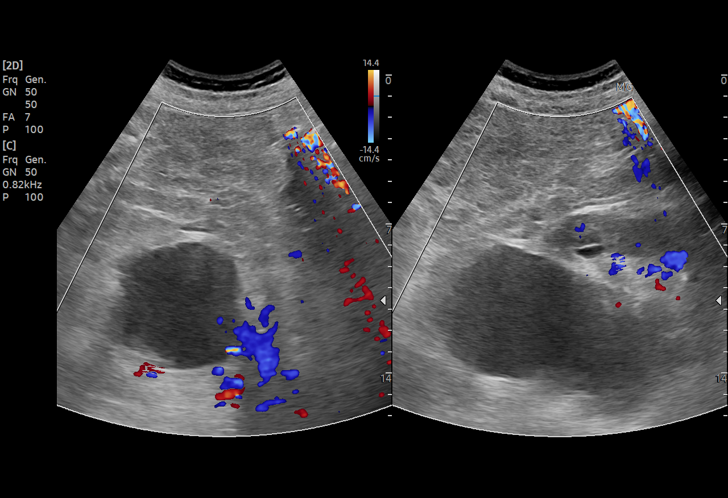
[im 18/29]
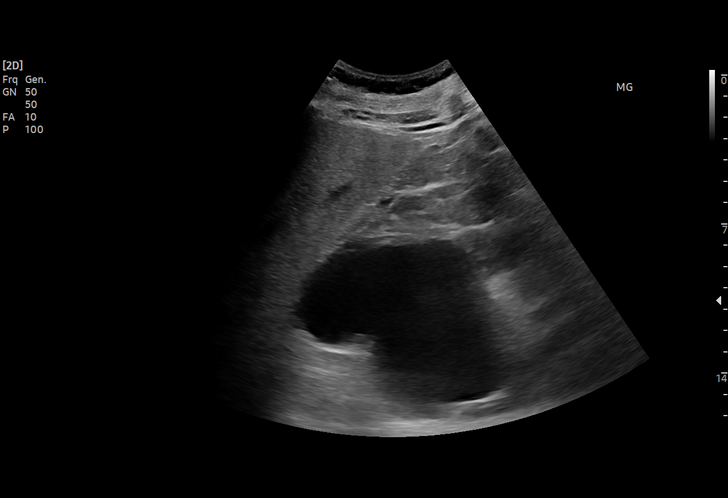
[im 20/29]
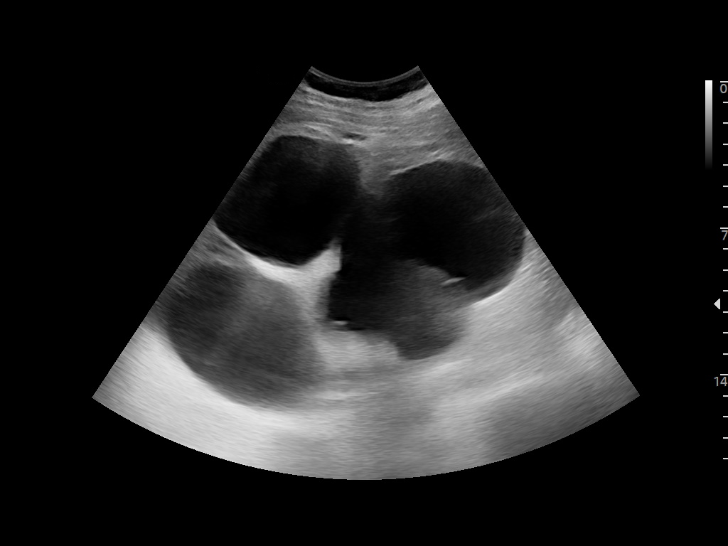
[im 23/29]
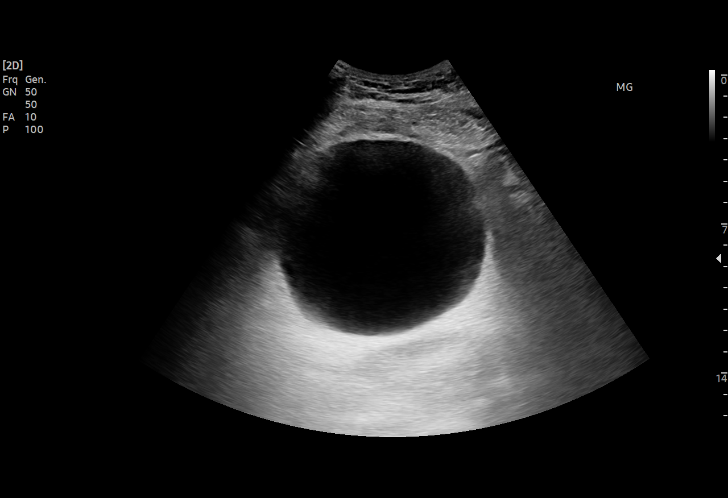
[im 24/29]
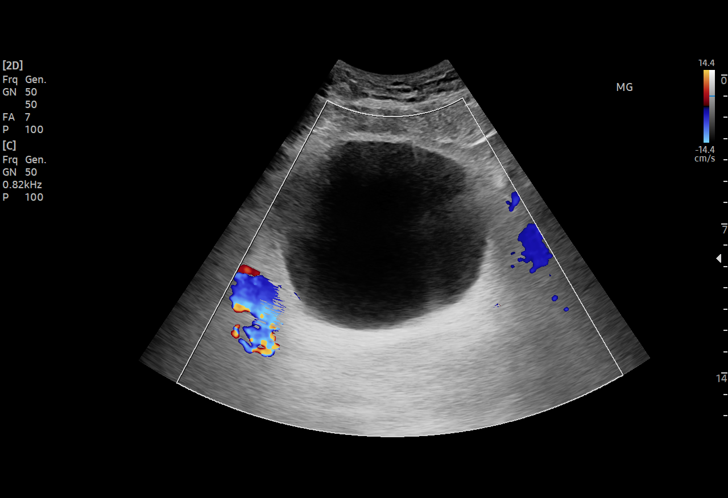
[im 26/29]
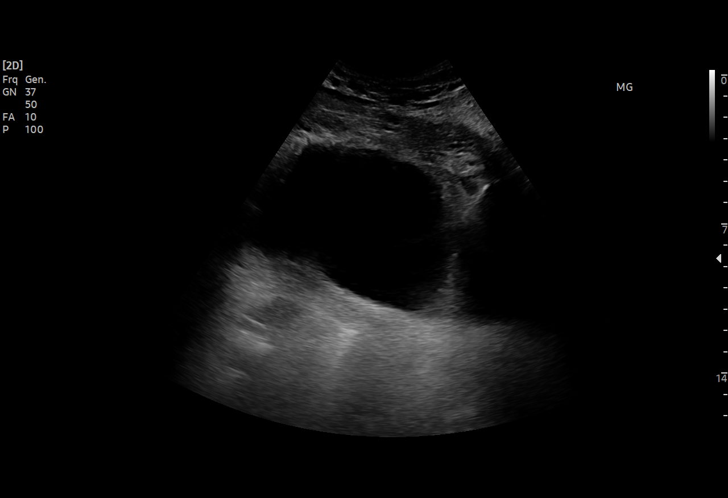
[im 29/29]
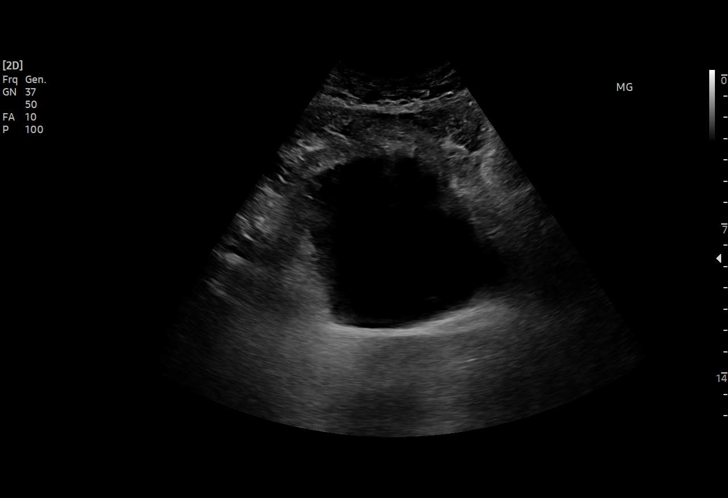

[15 of 25 positions shown; findings below may reference images not displayed]

FINDINGS: Right Kidney:

Length: 10.5 cm x 5.5 cm x 9.0 cm, 269 cc. Cystic lesion involving
the right kidney, compatible with prior CT measures 8.2 cm x 6.1 cm
x 6.2 cm. No evidence of associated hydronephrosis. Unremarkable
echotexture of the right kidney cortical tissue.

Left Kidney:

Length: Left renal cortex has been replaced by advanced cystic
change/hydronephrosis in the setting of chronic left UPJ obstruction
described on prior CT.

Bladder:

Appears normal for degree of bladder distention.
IMPRESSION: Redemonstration of right renal cyst without evidence of superimposed
hydronephrosis. Greatest dimension estimated 8.2 cm.

Left renal cortex not well evaluated and is atrophic/replaced by
chronic cystic change/hydronephrosis in the setting of chronic UPJ
obstruction described on prior CT.

## 2022-11-02 NOTE — Progress Notes (Signed)
Referring Provider: Riley Lam, MD  Pre Test Genetic Consult  Referral Reason  William Good, a new patient at the [facility] clinic, is referred for genetic consult and testing of hypertrophic cardiomyopathy.  Personal Medical Information William Good (III.2 on pedigree) is a 67 year-old pleasant Caucasian gentleman who is here today with his brother-in-law, William Good. He was found to have HCM incidentally when he was seen by DR. Hochrein to obtain clearance for cancer surgery last year. His cardiac MRI (01/20/2022) demonstrated apical hypertrophy with a small apical aneurysm, LVEF of 66% and scar burden of 15%.  For the last two years he has been experiencing shortness of breath, fatigue, occasional chest tightness and dizziness. Reports a recent syncope event where he gained consciousness lying on the floor at home and does not remember getting out of bed at night.  Traditional Risk Factors William Good reports having hypertension at 17 that was treated with medication. Reports that his blood pressure normalized after undergoing rectal cancer surgery in Mar 2023 and is no longer on antihypertensive medication.  Family history  Relation to Proband Pedigree # Current age Heart condition/age of onset Notes  Sons, 2 IV.3-IV.4 39, 36 None Echo/EKG not done  Grandchildren, 3 V.1-V.3 5, 5, 2 None   Sisters, 2 III.1, III.3 71, 64 None Echo/EKG not done  Brother III.4 62 None Echo/EKG not done  Nephews, nieces IV.1, IV.2 IV.5-IV.8 46-25 None         Father II.3 Deceased None Died of old age @ 70  Paternal uncles, 2 II.1- II.2 Deceased None   All died in their 79s of old age  Paternal grandfather I.1 Deceased None Drowned @ 45s when his waders filled in water as he dove to save another person  Paternal grandmother I.2 Deceased Heart issues Died @ 98s of poor health Pacemaker @ 25s        Mother II.4 Deceased  None   Died @ 50s of ALS   Maternal uncles, 2 II.5-II.6 Deceased  None    Died of old age @ 12s  Maternal aunts, 4 II.7-II.10 Deceased None Died @ 81s of old age  Maternal grandfather I.3 Deceased unaware Died @ 5s of [place] spotted fever  Maternal grandmother I.4 Deceased None  Died of old age @ 72s    Genetics William Good was counseled on the genetics of hypertrophic cardiomyopathy (HCM). I explained to the patient that this is an autosomal dominant condition with incomplete penetrance i.e. not all individuals harboring the HCM mutation will present clinically with HCM, and age-related penetrance where clinical presentation of HCM increases with advanced age. Variability in clinical expression is also seen in families with HCM with affected family members presenting clinically at different ages and with symptoms ranging from mild to severe.  Since HCM is an autosomal dominant condition, first degree-relatives should seek regular surveillance for HCM.  First-degree relatives, include his two sons, sisters, and brother- all live in [place], except his younger sister. Clinical screening of first-degree relatives involves echocardiogram and EKG at regular intervals, frequency is typically determined by age, with those in their teens undergoing screening every year until the age of 15 and those over the age of 49 getting screened every 3-5 years. Patient verbalized understanding of this.  I informed the patient that about 8-10% of HCM patients can have compound and digenic mutations for HCM. Also briefly discussed the inheritance pattern and treatment /management plans for the infiltrative cardiomyopathies that present as HCM phenocopies.   Genetic testing is  a probabilistic test dependent upon age and severity of presentation, presence of risk factors for HCM and importantly family history of HCM or sudden death in first-degree relatives.   If a mutation is not identified, then all first-degree relatives should undergo regular screening for HCM. I emphasized that even  if the genetic test is negative, it does not mean that he does not have HCM. A negative test result can be due to limitations of the genetic test.   There is also the likelihood of identifying a "Variant of unknown significance". This result means that the variant has not been detected in a statistically significant number of HCM patients and/or functional studies have not been performed to verify its pathogenicity. This VUS can be tested in the family to see if it segregates with disease. If a VUS is found, first-degree relatives should undergo regular clinical screening for HCM.  If a pathogenic variant is reported, then first-degree family members can get tested for this variant. If they test positive, it is likely they will develop HCM. In light of variable expression and incomplete penetrance associated with HCM, it is not possible to predict when they will manifest clinically with HCM. It is recommended that family members that test positive for the familial pathogenic variant pursue clinical screening for HCM. Family members that test negative for the familial mutation need not pursue periodic screening for HCM, but seek care if symptoms develop.   Impression  William Good was incidentally found to have cardiac wall thickness suggestive of HCM at age 29 in the absence of other loading conditions such as uncontrolled HTN that can cause cardiac hypertrophy. He reports no family history of HCM or sudden death amongst his first-degree and extended family members. While it is possible that there is reduced penetrance for HCM in the family, it is also likely that he has a de novo mutation for HCM.  Genetic testing for HCM is recommended. This test should include the sarcomeric genes, namely MYBPC3, MYH7, TNNI3, TNNT2, TPM1, ACTC1, MYL2, MYL3 implicated in HCM as well as the genes for the HCM phenocopies, such as Fabry disease (GLA), Danon disease (LAMP2), WPW syndrome (PRKAG2) and familial transthyretin  amyloidosis (TTR) and phospholamban (PLN)  In addition, we discussed the protections afforded by the Genetic Information Non-Discrimination Act (GINA). I explained to the patient that GINA protects them from losing their employment or health insurance based on their genotype. However, these protections do not cover life insurance and disability. Patient verbalized understanding of this and is unsure if his children have life insurance coverage.  Please note that the patient has not been counseled in this visit on other personal, cultural, or ethical issues that they may face due to their heart condition.   Plan After a thorough discussion of the risk and benefits of genetic testing for HCM, William Good declines genetic testing due to insurance non-coverage. He will discuss HCM screening recommendations with his children and inform them about obtaining life insurance.     Sidney Ace, Ph.D, [facility] Clinical Molecular Geneticist

## 2022-11-03 ENCOUNTER — Telehealth: Payer: Self-pay | Admitting: Internal Medicine

## 2022-11-03 NOTE — Telephone Encounter (Signed)
The patient has been notified of the result and verbalized understanding.  All questions (if any) were answered. Arvid Right Gennett Garcia, RN 11/03/2022 4:01 PM    Pt expressed would like PCP to follow IDA.  Reports has an OV coming up soon.  Advised pt I will forward PCP lab results.  Pt had no further questions or concerns at this time.

## 2022-11-03 NOTE — Telephone Encounter (Signed)
Pt returning nurses call regarding results. Please advise 

## 2022-11-19 ENCOUNTER — Encounter (HOSPITAL_COMMUNITY)
Admission: RE | Admit: 2022-11-19 | Discharge: 2022-11-19 | Disposition: A | Discharge status: Home / Self Care | Payer: Medicare Other | Source: Ambulatory Visit | Attending: Anesthesiology | Admitting: Anesthesiology

## 2022-11-19 DIAGNOSIS — E119 Type 2 diabetes mellitus without complications: Secondary | ICD-10-CM

## 2022-11-19 DIAGNOSIS — I25110 Atherosclerotic heart disease of native coronary artery with unstable angina pectoris: Secondary | ICD-10-CM

## 2022-11-19 NOTE — Progress Notes (Signed)
COVID Vaccine received:  []  No [x]  Yes Date of any COVID positive Test in last 90 days:  PCP - Dr. Henrine Screws  at [facility]  [phone] Cardiologist - Rollene Rotunda, MD  Oncology- Thornton Papas, MD  Chest x-ray - 10-24-2020  2v  Epic EKG -  08-06-2022 Stress Test -  ECHO -04-01-2021  Cardiac Cath -   PCR screen: []  Ordered & Completed           []   No Order but Needs PROFEND           [x]   N/A for this surgery  Surgery Plan:  [x]  Ambulatory   []  Outpatient in bed  []  Admit  Anesthesia:    [x]  General  []  Spinal  []   Choice []   MAC  Bowel Prep - [x]  No  []   Yes ______  Pacemaker / ICD device [x]  No []  Yes   Spinal Cord Stimulator:[x]  No []  Yes       History of Sleep Apnea? []  No []  Yes  STOPBANG  patient refused sleep study offered by Dr. Izora Ribas  CPAP used?- []  No []  Yes    Does the patient monitor blood sugar?  []  No []  Yes  []  N/A  Patient has: []  NO Hx DM   []  Pre-DM   []  DM1  [x]   DM2 Last A1c was:   7.4 on    08-11-22  Does patient have a [employer] or Dexacom? []  No []  Yes   Fasting Blood Sugar Ranges-  Checks Blood Sugar _____ times a day  Diabetic medications/ instructions: Glipizide- 5mg  q day.    Day BEFORE surgery- if takes in am or lunch:  take usual dose.  Do not take in the evening.   DAY OF SURGERY- DO NOT TAKE.   Blood Thinner / Instructions: Aspirin Instructions:  ERAS Protocol Ordered: [x]  No  []  Yes Patient is to be NPO after:   Comments:   Activity level: Can go up a flight of stairs and activities of daily living without stopping and without chest pain and/or shortness of breath. Able to exercise without chest pain and/or shortness of breath  Anesthesia review: DM2, HTN, CAD, CKD3b, HCM- NYHA III, anemia, s/p LAR for rectal cancer,  Bipolar 1, GERD, Restless Legs,   Patient denies shortness of breath, fever, cough and chest pain at PAT appointment.  Patient verbalized understanding and agreement to the Pre-Surgical  Instructions that were given to them at this PAT appointment. Patient was also educated of the need to review these PAT instructions again prior to his surgery.I reviewed the appropriate phone numbers to call if they have any and questions or concerns.

## 2022-11-19 NOTE — Patient Instructions (Signed)
SURGICAL WAITING ROOM VISITATION Patients having surgery or a procedure may have no more than 2 support people in the waiting area - these visitors may rotate in the visitor waiting room.   Due to an increase in RSV and influenza rates and associated hospitalizations, children ages 80 and under may not visit patients in [facility] hospitals. If the patient needs to stay at the hospital during part of their recovery, the visitor guidelines for inpatient rooms apply.  PRE-OP VISITATION  Pre-op nurse will coordinate an appropriate time for 1 support person to accompany the patient in pre-op.  This support person may not rotate.  This visitor will be contacted when the time is appropriate for the visitor to come back in the pre-op area.  Please refer to the [facility] website for the visitor guidelines for Inpatients (after your surgery is over and you are in a regular room).  You are not required to quarantine at this time prior to your surgery. However, you must do this: Hand Hygiene often Do NOT share personal items Notify your provider if you are in close contact with someone who has COVID or you develop fever 100.4 or greater, new onset of sneezing, cough, sore throat, shortness of breath or body aches.  If you test positive for Covid or have been in contact with anyone that has tested positive in the last 10 days please notify you surgeon.    Your procedure is scheduled on:  Friday   November 26, 2022  Report to [facility] Main Entrance: [place] entrance where the [employer] is available.   Report to admitting at: 09:15    AM  Call this number if you have any questions or problems the morning of surgery [phone]  DO NOT EAT OR DRINK ANYTHING AFTER MIDNIGHT THE NIGHT PRIOR TO YOUR SURGERY / PROCEDURE.   FOLLOW BOWEL PREP AND ANY ADDITIONAL PRE OP INSTRUCTIONS YOU RECEIVED FROM YOUR SURGEON'S OFFICE!!!   Oral Hygiene is also important to reduce your risk of  infection.        Remember - BRUSH YOUR TEETH THE MORNING OF SURGERY WITH YOUR REGULAR TOOTHPASTE  Do NOT smoke after Midnight the night before surgery.  STOP TAKING all Vitamins, Herbs and supplements 1 week before your surgery.   Take ONLY these medicines the morning of surgery with A SIP OF WATER: omeprazole (Prilosec), prapranolol ( Inderal),  you  may take Alprazolam (Xanax) if needed for anxiety.  You may take Tylenol if needed for pain.    You may not have any metal on your body including jewelry, and body piercing  Do not wear , lotions, powders,  cologne, or deodorant   Men may shave face and neck.  Contacts, Hearing Aids, dentures or bridgework may not be worn into surgery. DENTURES WILL BE REMOVED PRIOR TO SURGERY PLEASE DO NOT APPLY "Poly grip" OR ADHESIVES!!!  You may bring a small overnight bag with you on the day of surgery, only pack items that are not valuable. [facility] IS NOT RESPONSIBLE   FOR VALUABLES THAT ARE LOST OR STOLEN.   Patients discharged on the day of surgery will not be allowed to drive home.  Someone NEEDS to stay with you for the first 24 hours after anesthesia.  Do not bring your home medications to the hospital. The Pharmacy will dispense medications listed on your medication list to you during your admission in the Hospital.  Special Instructions: Bring a copy of your healthcare power  of attorney and living will documents the day of surgery, if you wish to have them scanned into your [facility] Records- EPIC  Please read over the following fact sheets you were given: IF YOU HAVE QUESTIONS ABOUT YOUR PRE-OP INSTRUCTIONS, PLEASE CALL [phone].   [facility] - Preparing for Surgery Before surgery, you can play an important role.  Because skin is not sterile, your skin needs to be as free of germs as possible.  You can reduce the number of germs on your skin by washing with CHG (chlorahexidine gluconate) soap before surgery.  CHG is an  antiseptic cleaner which kills germs and bonds with the skin to continue killing germs even after washing. Please DO NOT use if you have an allergy to CHG or antibacterial soaps.  If your skin becomes reddened/irritated stop using the CHG and inform your nurse when you arrive at Short Stay. Do not shave (including legs and underarms) for at least 48 hours prior to the first CHG shower.  You may shave your face/neck.  Please follow these instructions carefully:  1.  Shower with CHG Soap the night before surgery and the  morning of surgery.  2.  If you choose to wash your hair, wash your hair first as usual with your normal  shampoo.  3.  After you shampoo, rinse your hair and body thoroughly to remove the shampoo.                             4.  Use CHG as you would any other liquid soap.  You can apply chg directly to the skin and wash.  Gently with a scrungie or clean washcloth.  5.  Apply the CHG Soap to your body ONLY FROM THE NECK DOWN.   Do not use on face/ open                           Wound or open sores. Avoid contact with eyes, ears mouth and genitals (private parts).                       Wash face,  Genitals (private parts) with your normal soap.             6.  Wash thoroughly, paying special attention to the area where your  surgery  will be performed.  7.  Thoroughly rinse your body with warm water from the neck down.  8.  DO NOT shower/wash with your normal soap after using and rinsing off the CHG Soap.            9.  Pat yourself dry with a clean towel.            10.  Wear clean pajamas.            11.  Place clean sheets on your bed the night of your first shower and do not  sleep with pets.  ON THE DAY OF SURGERY : Do not apply any lotions/deodorants the morning of surgery.  Please wear clean clothes to the hospital/surgery center.    FAILURE TO FOLLOW THESE INSTRUCTIONS MAY RESULT IN THE CANCELLATION OF YOUR SURGERY  PATIENT  SIGNATURE_________________________________  NURSE SIGNATURE__________________________________  ________________________________________________________________________

## 2022-11-19 NOTE — Progress Notes (Signed)
COVID Vaccine Completed: Yes  Date of COVID positive in last 90 days:  PCP - Henrine Screws, MD Cardiologist -   Chest x-ray - CT chest 05-31-22 Epic EKG - 08-06-22 Epic Stress Test -  ECHO - 04-01-21 Epic Cardiac Cath -  Pacemaker/ICD device last checked: Spinal Cord Stimulator: MR cardiac - 01-20-22 Epic Long Term Monitor - 01-18-22 Epic  Bowel Prep -   Sleep Study -  CPAP -   Fasting Blood Sugar -  Checks Blood Sugar _____ times a day  Last dose of GLP1 agonist-  N/A GLP1 instructions:  N/A   Last dose of SGLT-2 inhibitors-  N/A SGLT-2 instructions: N/A   Blood Thinner Instructions:  Time Aspirin Instructions: Last Dose:  Activity level:  Can go up a flight of stairs and perform activities of daily living without stopping and without symptoms of chest pain or shortness of breath.  Able to exercise without symptoms  Unable to go up a flight of stairs without symptoms of     Anesthesia review:  CAD, COPD, hypertrophic cardiomyopathy, HTN, DM, CKD  Patient denies shortness of breath, fever, cough and chest pain at PAT appointment  Patient verbalized understanding of instructions that were given to them at the PAT appointment. Patient was also instructed that they will need to review over the PAT instructions again at home before surgery.

## 2022-11-19 NOTE — Patient Instructions (Signed)
SURGICAL WAITING ROOM VISITATION Patients having surgery or a procedure may have no more than 2 support people in the waiting area - these visitors may rotate.    Children under the age of 39 must have an adult with them who is not the patient.  If the patient needs to stay at the hospital during part of their recovery, the visitor guidelines for inpatient rooms apply. Pre-op nurse will coordinate an appropriate time for 1 support person to accompany patient in pre-op.  This support person may not rotate.    Please refer to the [facility] website for the visitor guidelines for Inpatients (after your surgery is over and you are in a regular room).       Your procedure is scheduled on: 11-26-22   Report to [facility] Main Entrance    Report to admitting at 9:15 AM   Call this number if you have problems the morning of surgery [phone]   Do not eat food  or drink liquids:After Midnight.           If you have questions, please contact your surgeon's office.   FOLLOW  ANY ADDITIONAL PRE OP INSTRUCTIONS YOU RECEIVED FROM YOUR SURGEON'S OFFICE!!!     Oral Hygiene is also important to reduce your risk of infection.                                    Remember - BRUSH YOUR TEETH THE MORNING OF SURGERY WITH YOUR REGULAR TOOTHPASTE   Do NOT smoke after Midnight   Take these medicines the morning of surgery with A SIP OF WATER:   Omeprazole  Propranolol  Pravastatin  Alprazolam and Tylenol if needed  Stop all vitamins and herbal supplements 7 days before surgery  How to Manage Your Diabetes Before and After Surgery  Why is it important to control my blood sugar before and after surgery? Improving blood sugar levels before and after surgery helps healing and can limit problems. A way of improving blood sugar control is eating a healthy diet by:  Eating less sugar and carbohydrates  Increasing activity/exercise  Talking with your doctor about reaching your blood sugar  goals High blood sugars (greater than 180 mg/dL) can raise your risk of infections and slow your recovery, so you will need to focus on controlling your diabetes during the weeks before surgery. Make sure that the doctor who takes care of your diabetes knows about your planned surgery including the date and location.  How do I manage my blood sugar before surgery? Check your blood sugar at least 4 times a day, starting 2 days before surgery, to make sure that the level is not too high or low. Check your blood sugar the morning of your surgery when you wake up and every 2 hours until you get to the Short Stay unit. If your blood sugar is less than 70 mg/dL, you will need to treat for low blood sugar: Do not take insulin. Treat a low blood sugar (less than 70 mg/dL) with  cup of clear juice (cranberry or apple), 4 glucose tablets, OR glucose gel. Recheck blood sugar in 15 minutes after treatment (to make sure it is greater than 70 mg/dL). If your blood sugar is not greater than 70 mg/dL on recheck, call [phone] for further instructions. Report your blood sugar to the short stay nurse when you get to Short Stay.  If  you are admitted to the hospital after surgery: Your blood sugar will be checked by the staff and you will probably be given insulin after surgery (instead of oral diabetes medicines) to make sure you have good blood sugar levels. The goal for blood sugar control after surgery is 80-180 mg/dL.   WHAT DO I DO ABOUT MY DIABETES MEDICATION?  Do not take oral diabetes medicines (pills) the morning of surgery.  DO NOT TAKE THE FOLLOWING 7 DAYS PRIOR TO SURGERY: Ozempic, Wegovy, Rybelsus (Semaglutide), Byetta (exenatide), Bydureon (exenatide ER), Victoza, Saxenda (liraglutide), or Trulicity (dulaglutide) Mounjaro (Tirzepatide) Adlyxin (Lixisenatide), Polyethylene Glycol Loxenatide.   Reviewed and Endorsed by [facility] Patient Education Committee, August 2015  Bring CPAP mask and  tubing day of surgery.                              You may not have any metal on your body including  jewelry, and body piercing             Do not wear  lotions, powders, cologne, or deodorant              Men may shave face and neck.   Do not bring valuables to the hospital. [facility] IS NOT RESPONSIBLE   FOR VALUABLES.   Contacts, dentures or bridgework may not be worn into surgery.  DO NOT BRING YOUR HOME MEDICATIONS TO THE HOSPITAL. PHARMACY WILL DISPENSE MEDICATIONS LISTED ON YOUR MEDICATION LIST TO YOU DURING YOUR ADMISSION IN THE HOSPITAL!    Patients discharged on the day of surgery will not be allowed to drive home.  Someone NEEDS to stay with you for the first 24 hours after anesthesia.   Special Instructions: Bring a copy of your healthcare power of attorney and living will documents the day of surgery if you haven't scanned them before.              Please read over the following fact sheets you were given: IF YOU HAVE QUESTIONS ABOUT YOUR PRE-OP INSTRUCTIONS PLEASE CALL [phone] G[facility]  If you received a COVID test during your pre-op visit  it is requested that you wear a mask when out in public, stay away from anyone that may not be feeling well and notify your surgeon if you develop symptoms. If you test positive for Covid or have been in contact with anyone that has tested positive in the last 10 days please notify you surgeon.  [facility] - Preparing for Surgery Before surgery, you can play an important role.  Because skin is not sterile, your skin needs to be as free of germs as possible.  You can reduce the number of germs on your skin by washing with CHG (chlorahexidine gluconate) soap before surgery.  CHG is an antiseptic cleaner which kills germs and bonds with the skin to continue killing germs even after washing. Please DO NOT use if you have an allergy to CHG or antibacterial soaps.  If your skin becomes reddened/irritated stop using the CHG and inform your  nurse when you arrive at Short Stay. Do not shave (including legs and underarms) for at least 48 hours prior to the first CHG shower.  You may shave your face/neck.  Please follow these instructions carefully:  1.  Shower with CHG Soap the night before surgery and the  morning of surgery.  2.  If you choose to wash your hair, wash your hair first  as usual with your normal  shampoo.  3.  After you shampoo, rinse your hair and body thoroughly to remove the shampoo.                             4.  Use CHG as you would any other liquid soap.  You can apply chg directly to the skin and wash.  Gently with a scrungie or clean washcloth.  5.  Apply the CHG Soap to your body ONLY FROM THE NECK DOWN.   Do   not use on face/ open                           Wound or open sores. Avoid contact with eyes, ears mouth and   genitals (private parts).                       Wash face,  Genitals (private parts) with your normal soap.             6.  Wash thoroughly, paying special attention to the area where your    surgery  will be performed.  7.  Thoroughly rinse your body with warm water from the neck down.  8.  DO NOT shower/wash with your normal soap after using and rinsing off the CHG Soap.                9.  Pat yourself dry with a clean towel.            10.  Wear clean pajamas.            11.  Place clean sheets on your bed the night of your first shower and do not  sleep with pets. Day of Surgery : Do not apply any lotions/deodorants the morning of surgery.  Please wear clean clothes to the hospital/surgery center.  FAILURE TO FOLLOW THESE INSTRUCTIONS MAY RESULT IN THE CANCELLATION OF YOUR SURGERY  PATIENT SIGNATURE_________________________________  NURSE SIGNATURE__________________________________  ________________________________________________________________________

## 2022-11-23 ENCOUNTER — Other Ambulatory Visit (HOSPITAL_COMMUNITY): Payer: Medicare Other

## 2022-11-23 DIAGNOSIS — N130 Hydronephrosis with ureteropelvic junction obstruction: Secondary | ICD-10-CM | POA: Diagnosis not present

## 2022-11-23 DIAGNOSIS — N270 Small kidney, unilateral: Secondary | ICD-10-CM | POA: Diagnosis not present

## 2022-11-23 DIAGNOSIS — N3000 Acute cystitis without hematuria: Secondary | ICD-10-CM | POA: Diagnosis not present

## 2022-11-24 ENCOUNTER — Encounter (HOSPITAL_COMMUNITY): Payer: Self-pay

## 2022-11-24 ENCOUNTER — Other Ambulatory Visit: Payer: Self-pay

## 2022-11-24 ENCOUNTER — Encounter (HOSPITAL_COMMUNITY)
Admission: RE | Admit: 2022-11-24 | Discharge: 2022-11-24 | Disposition: A | Discharge status: Home / Self Care | Payer: Medicare Other | Source: Ambulatory Visit | Attending: Urology | Admitting: Urology

## 2022-11-24 DIAGNOSIS — E119 Type 2 diabetes mellitus without complications: Secondary | ICD-10-CM | POA: Diagnosis not present

## 2022-11-24 DIAGNOSIS — Z01812 Encounter for preprocedural laboratory examination: Secondary | ICD-10-CM | POA: Diagnosis not present

## 2022-11-24 DIAGNOSIS — I25110 Atherosclerotic heart disease of native coronary artery with unstable angina pectoris: Secondary | ICD-10-CM | POA: Insufficient documentation

## 2022-11-24 LAB — CBC
HCT: 40.9 % (ref 39.0–52.0)
Hemoglobin: 12.5 g/dL — ABNORMAL LOW (ref 13.0–17.0)
MCH: 27.4 pg (ref 26.0–34.0)
MCHC: 30.6 g/dL (ref 30.0–36.0)
MCV: 89.7 fL (ref 80.0–100.0)
Platelets: 147 10*3/uL — ABNORMAL LOW (ref 150–400)
RBC: 4.56 MIL/uL (ref 4.22–5.81)
RDW: 19.8 % — ABNORMAL HIGH (ref 11.5–15.5)
WBC: 6.1 10*3/uL (ref 4.0–10.5)
nRBC: 0 % (ref 0.0–0.2)

## 2022-11-24 LAB — BASIC METABOLIC PANEL WITH GFR
Anion gap: 11 (ref 5–15)
BUN: 15 mg/dL (ref 8–23)
CO2: 26 mmol/L (ref 22–32)
Calcium: 9 mg/dL (ref 8.9–10.3)
Chloride: 100 mmol/L (ref 98–111)
Creatinine, Ser: 1.74 mg/dL — ABNORMAL HIGH (ref 0.61–1.24)
GFR, Estimated: 42 mL/min — ABNORMAL LOW (ref >60)
Glucose, Bld: 183 mg/dL — ABNORMAL HIGH (ref 70–99)
Potassium: 4.1 mmol/L (ref 3.5–5.1)
Sodium: 137 mmol/L (ref 135–145)

## 2022-11-24 LAB — GLUCOSE, CAPILLARY: Glucose-Capillary: 169 mg/dL — ABNORMAL HIGH (ref 70–99)

## 2022-11-24 NOTE — Progress Notes (Signed)
Patient's brother-in-law had concerns if consent form was correct for surgery, he stated that he was not aware that patient had stent on left side.  Spoke to [facility] at Dr. Emmaline Life office who verified that consent was correct.  Patient will sign consent day of surgery.

## 2022-11-25 LAB — HEMOGLOBIN A1C
Hgb A1c MFr Bld: 6.5 % — ABNORMAL HIGH (ref 4.8–5.6)
Mean Plasma Glucose: 140 mg/dL

## 2022-11-26 ENCOUNTER — Ambulatory Visit (HOSPITAL_COMMUNITY)
Admission: RE | Admit: 2022-11-26 | Discharge: 2022-11-26 | Disposition: A | Discharge status: Home / Self Care | Payer: Medicare Other | Attending: Urology | Admitting: Urology

## 2022-11-26 ENCOUNTER — Ambulatory Visit (HOSPITAL_COMMUNITY): Payer: Medicare Other | Admitting: Physician Assistant

## 2022-11-26 ENCOUNTER — Other Ambulatory Visit: Payer: Self-pay

## 2022-11-26 ENCOUNTER — Ambulatory Visit (HOSPITAL_BASED_OUTPATIENT_CLINIC_OR_DEPARTMENT_OTHER): Payer: Medicare Other

## 2022-11-26 ENCOUNTER — Encounter (HOSPITAL_COMMUNITY): Payer: Self-pay | Admitting: Urology

## 2022-11-26 ENCOUNTER — Encounter (HOSPITAL_COMMUNITY)
Admission: RE | Disposition: A | Discharge status: Home / Self Care | Payer: Self-pay | Source: Home / Self Care | Attending: Urology

## 2022-11-26 ENCOUNTER — Ambulatory Visit (HOSPITAL_COMMUNITY): Payer: Medicare Other

## 2022-11-26 DIAGNOSIS — E1122 Type 2 diabetes mellitus with diabetic chronic kidney disease: Secondary | ICD-10-CM | POA: Diagnosis not present

## 2022-11-26 DIAGNOSIS — N132 Hydronephrosis with renal and ureteral calculous obstruction: Secondary | ICD-10-CM | POA: Diagnosis not present

## 2022-11-26 DIAGNOSIS — N130 Hydronephrosis with ureteropelvic junction obstruction: Secondary | ICD-10-CM | POA: Diagnosis not present

## 2022-11-26 DIAGNOSIS — N19 Unspecified kidney failure: Secondary | ICD-10-CM | POA: Diagnosis not present

## 2022-11-26 DIAGNOSIS — Z85048 Personal history of other malignant neoplasm of rectum, rectosigmoid junction, and anus: Secondary | ICD-10-CM | POA: Diagnosis not present

## 2022-11-26 DIAGNOSIS — I2510 Atherosclerotic heart disease of native coronary artery without angina pectoris: Secondary | ICD-10-CM | POA: Insufficient documentation

## 2022-11-26 DIAGNOSIS — E119 Type 2 diabetes mellitus without complications: Secondary | ICD-10-CM

## 2022-11-26 DIAGNOSIS — N1330 Unspecified hydronephrosis: Secondary | ICD-10-CM | POA: Diagnosis not present

## 2022-11-26 DIAGNOSIS — Z6835 Body mass index (BMI) 35.0-35.9, adult: Secondary | ICD-10-CM | POA: Insufficient documentation

## 2022-11-26 DIAGNOSIS — Z87442 Personal history of urinary calculi: Secondary | ICD-10-CM | POA: Diagnosis not present

## 2022-11-26 DIAGNOSIS — I422 Other hypertrophic cardiomyopathy: Secondary | ICD-10-CM | POA: Diagnosis not present

## 2022-11-26 DIAGNOSIS — R6250 Unspecified lack of expected normal physiological development in childhood: Secondary | ICD-10-CM | POA: Insufficient documentation

## 2022-11-26 DIAGNOSIS — I129 Hypertensive chronic kidney disease with stage 1 through stage 4 chronic kidney disease, or unspecified chronic kidney disease: Secondary | ICD-10-CM | POA: Diagnosis not present

## 2022-11-26 DIAGNOSIS — F319 Bipolar disorder, unspecified: Secondary | ICD-10-CM | POA: Diagnosis not present

## 2022-11-26 DIAGNOSIS — T85698A Other mechanical complication of other specified internal prosthetic devices, implants and grafts, initial encounter: Secondary | ICD-10-CM | POA: Diagnosis not present

## 2022-11-26 DIAGNOSIS — I10 Essential (primary) hypertension: Secondary | ICD-10-CM

## 2022-11-26 DIAGNOSIS — J449 Chronic obstructive pulmonary disease, unspecified: Secondary | ICD-10-CM | POA: Insufficient documentation

## 2022-11-26 DIAGNOSIS — Z87891 Personal history of nicotine dependence: Secondary | ICD-10-CM

## 2022-11-26 DIAGNOSIS — Z87440 Personal history of urinary (tract) infections: Secondary | ICD-10-CM | POA: Diagnosis not present

## 2022-11-26 DIAGNOSIS — E669 Obesity, unspecified: Secondary | ICD-10-CM | POA: Insufficient documentation

## 2022-11-26 DIAGNOSIS — K219 Gastro-esophageal reflux disease without esophagitis: Secondary | ICD-10-CM | POA: Diagnosis not present

## 2022-11-26 DIAGNOSIS — N1832 Chronic kidney disease, stage 3b: Secondary | ICD-10-CM | POA: Diagnosis not present

## 2022-11-26 DIAGNOSIS — N182 Chronic kidney disease, stage 2 (mild): Secondary | ICD-10-CM | POA: Insufficient documentation

## 2022-11-26 HISTORY — PX: CYSTOSCOPY WITH STENT PLACEMENT: SHX5790

## 2022-11-26 HISTORY — PX: CYSTOSCOPY W/ URETERAL STENT REMOVAL: SHX1430

## 2022-11-26 LAB — GLUCOSE, CAPILLARY
Glucose-Capillary: 168 mg/dL — ABNORMAL HIGH (ref 70–99)
Glucose-Capillary: 151 mg/dL — ABNORMAL HIGH (ref 70–99)

## 2022-11-26 SURGERY — REMOVAL, STENT, URETER, CYSTOSCOPIC
Anesthesia: General | Laterality: Right

## 2022-11-26 MED ORDER — GENTAMICIN SULFATE 40 MG/ML IJ SOLN
5.0000 mg/kg | INTRAVENOUS | Status: AC
  Administered 2022-11-26: 470 mg via INTRAVENOUS
  Filled 2022-11-26: qty 11.75

## 2022-11-26 MED ORDER — ORAL CARE MOUTH RINSE: 15.0000 mL | Freq: Once | OROMUCOSAL | Status: AC

## 2022-11-26 MED ORDER — ONDANSETRON HCL 4 MG/2ML IJ SOLN
INTRAMUSCULAR | Status: DC | PRN
  Administered 2022-11-26: 4 mg via INTRAVENOUS

## 2022-11-26 MED ORDER — OXYCODONE HCL 5 MG/5ML PO SOLN: 5.0000 mg | Freq: Once | ORAL | Status: DC | PRN

## 2022-11-26 MED ORDER — CHLORHEXIDINE GLUCONATE 0.12 % MT SOLN
15.0000 mL | Freq: Once | OROMUCOSAL | Status: AC
  Administered 2022-11-26: 15 mL via OROMUCOSAL

## 2022-11-26 MED ORDER — ACETAMINOPHEN 10 MG/ML IV SOLN: 1000.0000 mg | Freq: Once | INTRAVENOUS | Status: DC | PRN

## 2022-11-26 MED ORDER — DROPERIDOL 2.5 MG/ML IJ SOLN: 0.6250 mg | Freq: Once | INTRAMUSCULAR | Status: DC | PRN

## 2022-11-26 MED ORDER — SODIUM CHLORIDE 0.9 % IR SOLN
Status: DC | PRN
  Administered 2022-11-26: 3000 mL via INTRAVESICAL

## 2022-11-26 MED ORDER — TRAMADOL HCL 50 MG PO TABS: 100.0000 mg | ORAL_TABLET | Freq: Four times a day (QID) | ORAL | 0 refills | Status: DC | PRN

## 2022-11-26 MED ORDER — LACTATED RINGERS IV SOLN: INTRAVENOUS | Status: DC | PRN

## 2022-11-26 MED ORDER — FENTANYL CITRATE PF 50 MCG/ML IJ SOSY: 25.0000 ug | PREFILLED_SYRINGE | INTRAMUSCULAR | Status: DC | PRN

## 2022-11-26 MED ORDER — FENTANYL CITRATE (PF) 100 MCG/2ML IJ SOLN
INTRAMUSCULAR | Status: AC
  Filled 2022-11-26: qty 2

## 2022-11-26 MED ORDER — FENTANYL CITRATE (PF) 100 MCG/2ML IJ SOLN
INTRAMUSCULAR | Status: DC | PRN
  Administered 2022-11-26: 25 ug via INTRAVENOUS
  Administered 2022-11-26: 50 ug via INTRAVENOUS

## 2022-11-26 MED ORDER — IOHEXOL 300 MG/ML  SOLN
INTRAMUSCULAR | Status: DC | PRN
  Administered 2022-11-26: 25 mL

## 2022-11-26 MED ORDER — MIDAZOLAM HCL 2 MG/2ML IJ SOLN
INTRAMUSCULAR | Status: AC
  Filled 2022-11-26: qty 2

## 2022-11-26 MED ORDER — LIDOCAINE HCL (CARDIAC) PF 100 MG/5ML IV SOSY
PREFILLED_SYRINGE | INTRAVENOUS | Status: DC | PRN
  Administered 2022-11-26: 100 mL via INTRATRACHEAL

## 2022-11-26 MED ORDER — PROPOFOL 10 MG/ML IV BOLUS
INTRAVENOUS | Status: DC | PRN
  Administered 2022-11-26: 150 mg via INTRAVENOUS

## 2022-11-26 MED ORDER — 0.9 % SODIUM CHLORIDE (POUR BTL) OPTIME
TOPICAL | Status: DC | PRN
Start: 2022-11-26 — End: 2022-11-26
  Administered 2022-11-26: 1000 mL

## 2022-11-26 MED ORDER — PROPOFOL 10 MG/ML IV BOLUS
INTRAVENOUS | Status: AC
  Filled 2022-11-26: qty 20

## 2022-11-26 MED ORDER — INSULIN ASPART 100 UNIT/ML IJ SOLN
0.0000 [IU] | INTRAMUSCULAR | Status: DC | PRN
  Administered 2022-11-26: 2 [IU] via SUBCUTANEOUS
  Filled 2022-11-26: qty 1

## 2022-11-26 MED ORDER — LACTATED RINGERS IV SOLN: INTRAVENOUS | Status: DC

## 2022-11-26 MED ORDER — OXYCODONE HCL 5 MG PO TABS: 5.0000 mg | ORAL_TABLET | Freq: Once | ORAL | Status: DC | PRN

## 2022-11-26 MED ORDER — FENTANYL CITRATE (PF) 250 MCG/5ML IJ SOLN
INTRAMUSCULAR | Status: AC
  Filled 2022-11-26: qty 5

## 2022-11-26 MED ORDER — DEXAMETHASONE SODIUM PHOSPHATE 10 MG/ML IJ SOLN
INTRAMUSCULAR | Status: DC | PRN
  Administered 2022-11-26: 5 mg via INTRAVENOUS

## 2022-11-26 SURGICAL SUPPLY — 19 items
BAG URO CATCHER STRL LF (MISCELLANEOUS) ×2 IMPLANT
BASKET LASER NITINOL 1.9FR (BASKET) IMPLANT
BASKET ZERO TIP NITINOL 2.4FR (BASKET) IMPLANT
BSKT STON RTRVL 120 1.9FR (BASKET) ×2
BSKT STON RTRVL ZERO TP 2.4FR (BASKET)
CATH URETL OPEN END 6FR 70 (CATHETERS) IMPLANT
CLOTH BEACON ORANGE TIMEOUT ST (SAFETY) ×2 IMPLANT
GLOVE SURG LX STRL 7.5 STRW (GLOVE) ×2 IMPLANT
GOWN STRL REUS W/ TWL XL LVL3 (GOWN DISPOSABLE) ×2 IMPLANT
GOWN STRL REUS W/TWL XL LVL3 (GOWN DISPOSABLE) ×2
GUIDEWIRE ANG ZIPWIRE 038X150 (WIRE) ×2 IMPLANT
GUIDEWIRE STR DUAL SENSOR (WIRE) IMPLANT
KIT TURNOVER KIT A (KITS) IMPLANT
MANIFOLD NEPTUNE II (INSTRUMENTS) ×2 IMPLANT
PACK CYSTO (CUSTOM PROCEDURE TRAY) ×2 IMPLANT
STENT URET 6FRX26 CONTOUR (STENTS) IMPLANT
TUBE PU 8FR 16IN ENFIT (TUBING) IMPLANT
TUBING CONNECTING 10 (TUBING) ×2 IMPLANT
TUBING UROLOGY SET (TUBING) IMPLANT

## 2022-11-26 NOTE — Anesthesia Postprocedure Evaluation (Signed)
Anesthesia Post Note  Patient: William Good  Procedure(s) Performed: CYSTOSCOPY WITH STENT REMOVAL (Right) CYSTOSCOPY WITH STENT EXCHANGE (Left)     Patient location during evaluation: PACU Anesthesia Type: General Level of consciousness: awake and alert Pain management: pain level controlled Vital Signs Assessment: post-procedure vital signs reviewed and stable Respiratory status: spontaneous breathing, nonlabored ventilation, respiratory function stable and patient connected to nasal cannula oxygen Cardiovascular status: blood pressure returned to baseline and stable Postop Assessment: no apparent nausea or vomiting Anesthetic complications: no   No notable events documented.  Last Vitals:  Vitals:   11/26/22 1300 11/26/22 1315  BP: (!) 133/92 (!) 160/86  Pulse: 70 70  Resp: 20   Temp:  36.4 C  SpO2: 92% 91%    Last Pain:  Vitals:   11/26/22 1315  TempSrc:   PainSc: 0-No pain                 [place]

## 2022-11-26 NOTE — Anesthesia Preprocedure Evaluation (Signed)
Anesthesia Evaluation  Patient identified by MRN, date of birth, ID band Patient awake    Reviewed: Allergy & Precautions, H&P , NPO status , Patient's Chart, lab work & pertinent test results  Airway Mallampati: II  TM Distance: >3 FB Neck ROM: Full    Dental no notable dental hx.    Pulmonary COPD, former smoker   Pulmonary exam normal breath sounds clear to auscultation       Cardiovascular hypertension, + CAD  Normal cardiovascular exam Rhythm:Regular Rate:Normal     Neuro/Psych  Headaches PSYCHIATRIC DISORDERS Anxiety Depression Bipolar Disorder      GI/Hepatic Neg liver ROS,GERD  ,,  Endo/Other  negative endocrine ROSdiabetes    Renal/GU CRFRenal disease  negative genitourinary   Musculoskeletal negative musculoskeletal ROS (+)    Abdominal   Peds negative pediatric ROS (+)  Hematology  (+) Blood dyscrasia, anemia   Anesthesia Other Findings Hypertrophic cardiomyopathy-Apical variant. Severe hypertrophy of apex, small apical aneurysm. Mild LVH of basal segments with no lvot obstruction noted.   Reproductive/Obstetrics negative OB ROS                              Anesthesia Physical Anesthesia Plan  ASA: 3  Anesthesia Plan: General   Post-op Pain Management:    Induction: Intravenous  PONV Risk Score and Plan: Ondansetron and Dexamethasone  Airway Management Planned: LMA  Additional Equipment:   Intra-op Plan:   Post-operative Plan: Extubation in OR  Informed Consent: I have reviewed the patients History and Physical, chart, labs and discussed the procedure including the risks, benefits and alternatives for the proposed anesthesia with the patient or authorized representative who has indicated his/her understanding and acceptance.     Dental advisory given  Plan Discussed with: CRNA  Anesthesia Plan Comments:          Anesthesia Quick Evaluation

## 2022-11-26 NOTE — H&P (Signed)
William Good is an 67 y.o. male.    Chief Complaint: Pre-Op RIGHT ureteral stent removal / LEFT ureteral stent exchange  HPI:   1 - Left Atrophic Kidney / Recurrent Bacteruria - massively hydronephrotic left kidney (likely congeintial UPJO) on imagign x several includign 2024 with recurrent Klebsiella bacteruria (ses cipro). Renogram with 6% relative function. Given very poor functional capacity, elected initial management with chronic stening.   Recent Course:  08/2022 - Left JJ stent placed (Also Rt URS and stent for sotne in funciotnally solitary kidney)   2 - Right Renal Cyst / R/O UPJ Stone - atbou 6cm Rt renal cyst that extends into renal sinus. ? UPJ area calcification on CT 2024 that was not present 2022. This is his dominant functional kidney. Treated with ureteroscopy 08/2022 to stone free.   3- Prostate Screning -  2024 - PSA 0.17   4 - Stage 3b Renal Insuficiency - Cr 1.7's, GFR 40 at baselien. Atrophic left kdieny and long h/o DM, HTN.   5 - Lower Urinary Tract symptoms - on tamsulsoin at baseline.   PMH sig for colon cancer / resection (temporary colostomy, reversal 2023), very large turncal obesity, some developmental delay (does not drive, lives by himself, makes own decisions, brother Jorja Loa very involved and lives close. His PCP is Henrine Screws MD.   Today "William Good" is seen to proceed with cysto and Rt ureteral stent removal / Left exchange / Bilateral retrogrades. No interval fevers. Most recent UCX negative.    Past Medical History:  Diagnosis Date   Anginal pain (HCC)    Anxiety    Back pain    Bipolar disorder (HCC)    bipolar 1   CKD (chronic kidney disease) stage 2, GFR 60-89 ml/min 09/24/2020   Coronary artery disease    GERD (gastroesophageal reflux disease)    High output ileostomy (HCC) 05/29/2021   Hypertension    Knee pain    Migraine    Palpitation    Rectal cancer (HCC)    Type 2 diabetes mellitus with complication, without long-term current  use of insulin (HCC) 03/19/2021   no meds    Past Surgical History:  Procedure Laterality Date   BACK SURGERY     CYSTOSCOPY WITH RETROGRADE PYELOGRAM, URETEROSCOPY AND STENT PLACEMENT Bilateral 08/13/2022   Procedure: CYSTOSCOPY WITH BILATERAL RETROGRADE PYELOGRAMS, RIGHT  URETEROSCOPY AND BILATERAL STENT PLACEMENT;  Surgeon: Loletta Parish., MD;  Location: [facility];  Service: Urology;  Laterality: Bilateral;  75 MINS   HOLMIUM LASER APPLICATION Right 08/13/2022   Procedure: HOLMIUM LASER APPLICATION;  Surgeon: Loletta Parish., MD;  Location: [facility];  Service: Urology;  Laterality: Right;   ILEO LOOP DIVERSION N/A 09/03/2021   Procedure: OPEN TAKEDOWN OF LOOP ILEOSTOMY;  Surgeon: Karie Soda, MD;  Location: [facility];  Service: General;  Laterality: N/A;   JOINT REPLACEMENT Right    knee   OSTOMY N/A 05/27/2021   Procedure: POSSIBLE OSTOMY;  Surgeon: Karie Soda, MD;  Location: [facility];  Service: General;  Laterality: N/A;   PORT-A-CATH REMOVAL Right 09/03/2021   Procedure: REMOVAL PORT-A-CATH;  Surgeon: Karie Soda, MD;  Location: [facility];  Service: General;  Laterality: Right;   PORTACATH PLACEMENT N/A 10/02/2020   Procedure: PORT-A-CATH PLACEMENT;  Surgeon: Fritzi Mandes, MD;  Location: [facility];  Service: General;  Laterality: N/A;   PROCTOSCOPY N/A 05/27/2021   Procedure: RIGID PROCTOSCOPY;  Surgeon: Karie Soda, MD;  Location: [facility];  Service: General;  Laterality: N/A;   XI ROBOTIC ASSISTED LOWER ANTERIOR RESECTION N/A 05/27/2021   Procedure: ROBOTIC LOW ANTERIOR COLON RESECTION, VERY LOW COLOANAL ANASTOMOSIS, DIVERTING ILEOSTOMY, BILATERAL TAP BLOCK,  FIRFLY INJECTION;  Surgeon: Karie Soda, MD;  Location: [facility];  Service: General;  Laterality: N/A;    Family History  Problem Relation Age of Onset   ALS Mother    Heart disease Mother    Parkinson's disease Mother    Heart failure Father    Social History:  reports that he quit smoking about 19 months ago. His  smoking use included cigarettes. He started smoking about 46 years ago. He has a 22.5 pack-year smoking history. He has never used smokeless tobacco. He reports that he does not drink alcohol and does not use drugs.  Allergies:  Allergies  Allergen Reactions   Nsaids Other (See Comments)    Chronic kidney disease - avoid NSAIDs     No medications prior to admission.    Results for orders placed or performed during the hospital encounter of 11/24/22 (from the past 48 hour(s))  Glucose, capillary     Status: Abnormal   Collection Time: 11/24/22  1:09 PM  Result Value Ref Range   Glucose-Capillary 169 (H) 70 - 99 mg/dL    Comment: Glucose reference range applies only to samples taken after fasting for at least 8 hours.  Hemoglobin A1c per protocol     Status: Abnormal   Collection Time: 11/24/22  1:22 PM  Result Value Ref Range   Hgb A1c MFr Bld 6.5 (H) 4.8 - 5.6 %    Comment: (NOTE)         Prediabetes: 5.7 - 6.4         Diabetes: >6.4         Glycemic control for adults with diabetes: <7.0    Mean Plasma Glucose 140 mg/dL    Comment: (NOTE) Performed At: [facility] [address] [place], [place] [id number] Jolene Schimke MD [id number]   Basic metabolic panel per protocol     Status: Abnormal   Collection Time: 11/24/22  1:22 PM  Result Value Ref Range   Sodium 137 135 - 145 mmol/L   Potassium 4.1 3.5 - 5.1 mmol/L   Chloride 100 98 - 111 mmol/L   CO2 26 22 - 32 mmol/L   Glucose, Bld 183 (H) 70 - 99 mg/dL    Comment: Glucose reference range applies only to samples taken after fasting for at least 8 hours.   BUN 15 8 - 23 mg/dL   Creatinine, Ser 3.66 (H) 0.61 - 1.24 mg/dL   Calcium 9.0 8.9 - 44.0 mg/dL   GFR, Estimated 42 (L) >60 mL/min    Comment: (NOTE) Calculated using the CKD-EPI Creatinine Equation (2021)    Anion gap 11 5 - 15    Comment: Performed at [facility], [address], [place], [place] [address]  CBC per protocol      Status: Abnormal   Collection Time: 11/24/22  1:22 PM  Result Value Ref Range   WBC 6.1 4.0 - 10.5 K/uL   RBC 4.56 4.22 - 5.81 MIL/uL   Hemoglobin 12.5 (L) 13.0 - 17.0 g/dL   HCT 59.5 63.8 - 75.6 %   MCV 89.7 80.0 - 100.0 fL   MCH 27.4 26.0 - 34.0 pg   MCHC 30.6 30.0 - 36.0 g/dL   RDW 43.3 (H) 29.5 - 18.8 %   Platelets 147 (L) 150 - 400 K/uL  nRBC 0.0 0.0 - 0.2 %    Comment: Performed at [facility], [address] [place], [place] [address]   No results found.  Review of Systems  Constitutional:  Negative for appetite change and fever.  All other systems reviewed and are negative.   There were no vitals taken for this visit. Physical Exam Vitals reviewed.  Constitutional:      Comments: Stable stigmata of developmental delay. Pleasant and at baseline.   Pulmonary:     Effort: Pulmonary effort is normal.  Abdominal:     Comments: Stable large truncal obesity.   Genitourinary:    Comments: No CVAT at present Musculoskeletal:        General: Normal range of motion.     Cervical back: Normal range of motion.  Skin:    General: Skin is warm.  Neurological:     Mental Status: He is alert.  Psychiatric:        Mood and Affect: Mood normal.      Assessment/Plan  Proceed as planned with cysto, bilateral retrogrades, Rt stent pull, Lt stent exchange. Risks, benefits, alternatives, expected peri-op course discussed previously and reiterated today.   Loletta Parish., MD 11/26/2022, 7:37 AM

## 2022-11-26 NOTE — Discharge Instructions (Signed)
1 - You may have urinary urgency (bladder spasms) and bloody urine on / off with stent in place. This is normal.  2 - Call MD or go to ER for fever >102, severe pain / nausea / vomiting not relieved by medications, or acute change in medical status

## 2022-11-26 NOTE — Brief Op Note (Signed)
11/26/2022  12:34 PM  PATIENT:  William Good  67 y.o. male  PRE-OPERATIVE DIAGNOSIS:  LEFT HYDRONEPHROSIS, RETAINED RIGHT STENT  POST-OPERATIVE DIAGNOSIS:  LEFT HYDRONEPHROSIS, RETAINED RIGHT STENT  PROCEDURE:  Procedure(s): CYSTOSCOPY WITH STENT REMOVAL (Right) CYSTOSCOPY WITH STENT EXCHANGE (Left)  SURGEON:  Surgeons and Role:    * Taaliyah Delpriore, Delbert Phenix., MD - Primary  PHYSICIAN ASSISTANT:   ASSISTANTS: none   ANESTHESIA:   general  EBL:  minimal   BLOOD ADMINISTERED:none  DRAINS: none   LOCAL MEDICATIONS USED:  NONE  SPECIMEN:  Source of Specimen:  bilateral stents   DISPOSITION OF SPECIMEN:   discard  COUNTS:  YES  TOURNIQUET:  * No tourniquets in log *  DICTATION: .Other Dictation: Dictation Number [id number]  PLAN OF CARE: Discharge to home after PACU  PATIENT DISPOSITION:  PACU - hemodynamically stable.   Delay start of Pharmacological VTE agent (>24hrs) due to surgical blood loss or risk of bleeding: yes

## 2022-11-26 NOTE — Transfer of Care (Signed)
Immediate Anesthesia Transfer of Care Note  Patient: William Good  Procedure(s) Performed: CYSTOSCOPY WITH STENT REMOVAL (Right) CYSTOSCOPY WITH STENT EXCHANGE (Left)  Patient Location: PACU  Anesthesia Type:General  Level of Consciousness: awake and alert   Airway & Oxygen Therapy: Patient Spontanous Breathing and Patient connected to face mask oxygen  Post-op Assessment: Report given to RN and Post -op Vital signs reviewed and stable  Post vital signs: Reviewed and stable  Last Vitals:  Vitals Value Taken Time  BP 137/92 11/26/22 1233  Temp    Pulse 74 11/26/22 1235  Resp 16 11/26/22 1235  SpO2 97 % 11/26/22 1235  Vitals shown include unfiled device data.  Last Pain:  Vitals:   11/26/22 0954  TempSrc:   PainSc: 2       Patients Stated Pain Goal: 6 (11/26/22 0954)  Complications: No notable events documented.

## 2022-11-26 NOTE — Anesthesia Procedure Notes (Signed)
Procedure Name: LMA Insertion Date/Time: 11/26/2022 12:00 PM  Performed by: Deri Fuelling, CRNAPre-anesthesia Checklist: Patient identified, Emergency Drugs available, Suction available and Patient being monitored Patient Re-evaluated:Patient Re-evaluated prior to induction Oxygen Delivery Method: Circle system utilized Preoxygenation: Pre-oxygenation with 100% oxygen Induction Type: IV induction Ventilation: Mask ventilation without difficulty LMA: LMA inserted LMA Size: 5.0 Tube type: Oral Number of attempts: 1 Airway Equipment and Method: Stylet and Oral airway Placement Confirmation: ETT inserted through vocal cords under direct vision, positive ETCO2 and breath sounds checked- equal and bilateral Tube secured with: Tape Dental Injury: Teeth and Oropharynx as per pre-operative assessment

## 2022-11-26 NOTE — Op Note (Unsigned)
NAME: William Good, WRITE MEDICAL RECORD NO: [id number] ACCOUNT NO: [id number] DATE OF BIRTH: 06-05-1955 FACILITY: [facility] LOCATION: [facility] PHYSICIAN: Sebastian Ache, MD  Operative Report   DATE OF PROCEDURE: 11/26/2022  PREOPERATIVE DIAGNOSIS:  History of left renal stones, functionally solitary kidney, left massive hydronephrosis, nonfunctional left kidney with recurrent infections.  PROCEDURE PERFORMED:   1.  Cystoscopy with bilateral retrograde pyelograms interpretation. 2.  Exchange of left ureteral stent. 3.  Removal of right ureteral stent.  ESTIMATED BLOOD LOSS:  Nil.  COMPLICATIONS:  None.  SPECIMEN:  Bilateral stents for discard.  FINDINGS:   1.  Right ureteral stent with some proximal migration.  This required ureteroscopic snaring technique to remove. 2.  Improvement in the left massive hydronephrosis with stenting. 3.  Successful left ureteral stent exchange.  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   Relative function only 6%.  He was also noted to have what I was quite concerned about is a stone in his functionally solitary right kidney.  He therefore underwent right ureteroscopic stone manipulation several months ago, which he tolerated well and  ureteral stent placement on the left to allow for decompression of his massively hydronephrotic kidney. With this he has had a significant decrease in his recurrent infections, which was quite favorable.  We agreed upon a plan of chronic stenting on the  left given his significant comorbidity.  He presents today for his left stent exchange and right stent removal.  Informed consent was obtained and placed in medical record.  PROCEDURE IN DETAIL:  The patient being identified and verified. Procedure being right ureteral  stent removal, left ureteral stent exchange was confirmed.  Procedure timeout was performed.  Intravenous antibiotics were administered.  General LMA  anesthesia induced.  The patient was placed into a low lithotomy position.  Sterile field was created, prepped and draped the patient's penis, perineum, and proximal thighs using iodine.  Cystourethroscopy was performed using 21-French rigid cystoscope  with offset lens.  Inspection of anterior and posterior urethra was unremarkable.  Inspection of urinary bladder revealed distal end of left ureteral stent in situ.  However, the distal end of the right stent was not visualized.  Fluoroscopic images  revealed that the right stent had migrated proximally with the distal end in the intramural ureter.  Attention was directed at left stent exchange.  Distal end of the left stent was grasped, brought to the urethral meatus and a 0.038 ZIPwire was  advanced, it was exchanged for open-ended catheter and left retrograde pyelogram was obtained.  Left retrograde pyelogram demonstrated single left ureter, single system left kidney.  There was a significant and very large hydronephrosis on the left, but this was significantly improved over prior.  A ZIPwire was once again advanced a new 6 x 26  contour type stent was placed. Using cystoscopic and fluoroscopic guidance good proximal and distal plane were noted.  Next, semirigid ureteroscopy performed of the distal right ureter using a semirigid ureteroscope and the stent was encountered just  above the intramural ureter as anticipated.  Using a snare technique with the Escape basket of the distal end of the stent was snared. It was then brought out in its entirety inspected and intact, set aside for discard.  Bladder was emptied per  cystoscope sheath and procedure was terminated.  The patient  tolerated procedure well, no immediate periprocedural complications.  The patient was taken to postanesthesia care in stable  condition.  Plan for discharge home. He will likely have a stent  exchange on the left in approximately 6 months as there was minimal underlying encrustation at this time.   PUS D: 11/26/2022 12:39:20 pm T: 11/26/2022 1:46:00 pm  JOB: [id number]/ [id number]

## 2022-11-27 ENCOUNTER — Encounter (HOSPITAL_COMMUNITY): Payer: Self-pay | Admitting: Urology

## 2022-11-29 DIAGNOSIS — N183 Chronic kidney disease, stage 3 unspecified: Secondary | ICD-10-CM | POA: Diagnosis not present

## 2022-11-29 DIAGNOSIS — D509 Iron deficiency anemia, unspecified: Secondary | ICD-10-CM | POA: Diagnosis not present

## 2022-12-31 DIAGNOSIS — E1159 Type 2 diabetes mellitus with other circulatory complications: Secondary | ICD-10-CM | POA: Diagnosis not present

## 2022-12-31 DIAGNOSIS — N1330 Unspecified hydronephrosis: Secondary | ICD-10-CM | POA: Diagnosis not present

## 2022-12-31 DIAGNOSIS — D509 Iron deficiency anemia, unspecified: Secondary | ICD-10-CM | POA: Diagnosis not present

## 2022-12-31 DIAGNOSIS — J449 Chronic obstructive pulmonary disease, unspecified: Secondary | ICD-10-CM | POA: Diagnosis not present

## 2022-12-31 DIAGNOSIS — N183 Chronic kidney disease, stage 3 unspecified: Secondary | ICD-10-CM | POA: Diagnosis not present

## 2022-12-31 DIAGNOSIS — I10 Essential (primary) hypertension: Secondary | ICD-10-CM | POA: Diagnosis not present

## 2022-12-31 DIAGNOSIS — E559 Vitamin D deficiency, unspecified: Secondary | ICD-10-CM | POA: Diagnosis not present

## 2022-12-31 DIAGNOSIS — E1122 Type 2 diabetes mellitus with diabetic chronic kidney disease: Secondary | ICD-10-CM | POA: Diagnosis not present

## 2022-12-31 DIAGNOSIS — E785 Hyperlipidemia, unspecified: Secondary | ICD-10-CM | POA: Diagnosis not present

## 2023-01-14 DIAGNOSIS — H2513 Age-related nuclear cataract, bilateral: Secondary | ICD-10-CM | POA: Diagnosis not present

## 2023-01-14 DIAGNOSIS — H402222 Chronic angle-closure glaucoma, left eye, moderate stage: Secondary | ICD-10-CM | POA: Diagnosis not present

## 2023-01-26 IMAGING — RF DG BE SINGLE CONTRAST
5 of 6 series · 14 of 24 positions shown · non-contrast
Comparison: No recent imaging is available for comparison post
surgery.

CLINICAL DATA: History of rectal cancer resection and primary
colorectal anastomosis.

EXAM:
WATER SOLUBLE CONTRAST ENEMA
TECHNIQUE: A gentle rectal examination was performed without signs of
obstruction or pain. Subsequently, a Foley catheter was inserted
into the rectum and visualized under fluoroscopic imaging passing
into the lower rectum in the area of the anastomosis. Subsequent
filling of the rectum was performed without balloon inflation.
Filling was performed under gravity.
FLUOROSCOPY:
Radiation Exposure Index (as provided by the fluoroscopic device):
80.7 mGy Kerma

[Series 1: one shot · 0.15mm/px · 1 of 3 slices shown (1 of 3)]
[im 1/3]
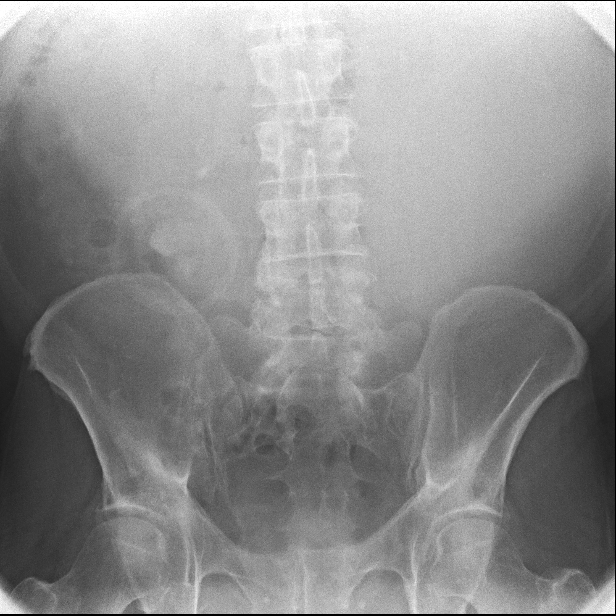

[Series 2: sequence · 2 of 13 frames shown (1 of 2)]
[frame 3/13]
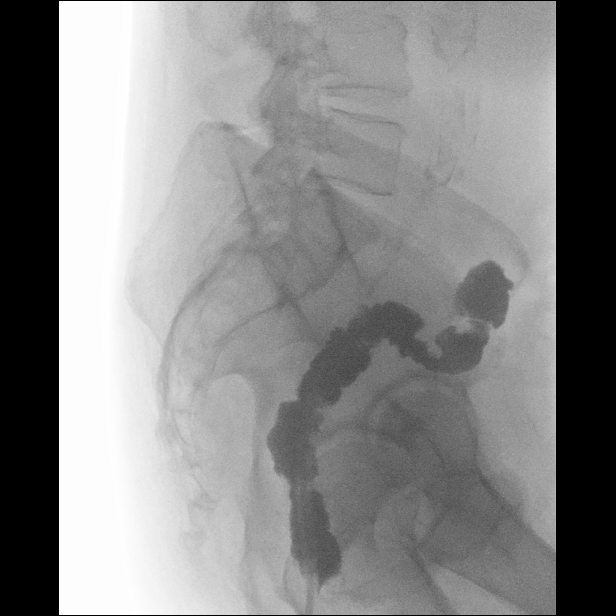
[frame 12/13]
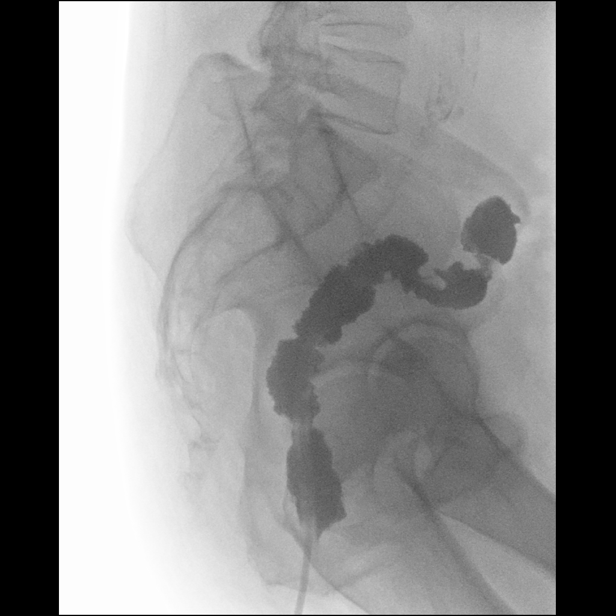

[Series 4: sequence · 2 of 5 frames shown (2 of 2)]
[frame 3/5]
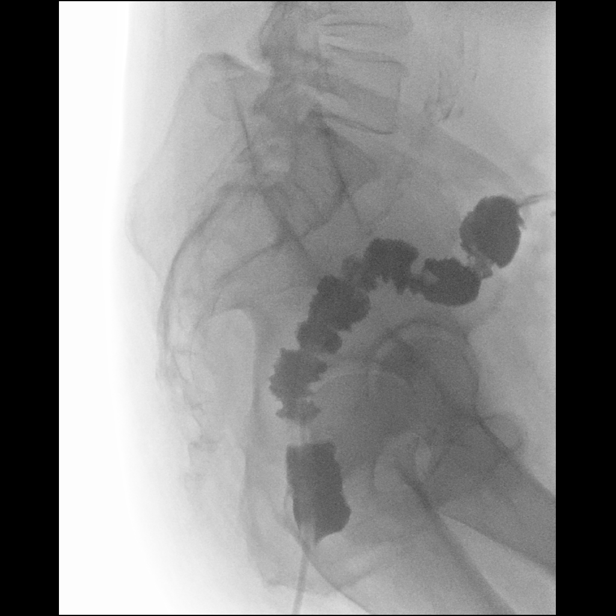
[frame 4/5]
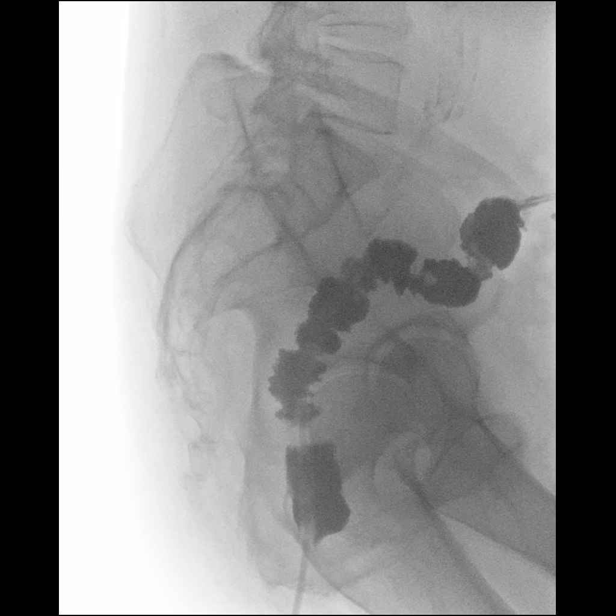

[Series 5: one shot · 0.15mm/px · 1 of 1 slices shown (2 of 3)]
[im 1/1]
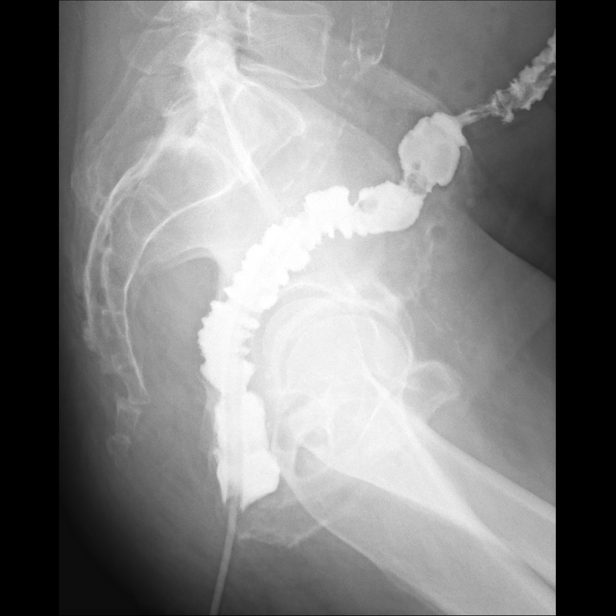

[Series 7: one shot · 0.16mm/px · 8 of 20 slices shown (3 of 3)]
[im 2/20]
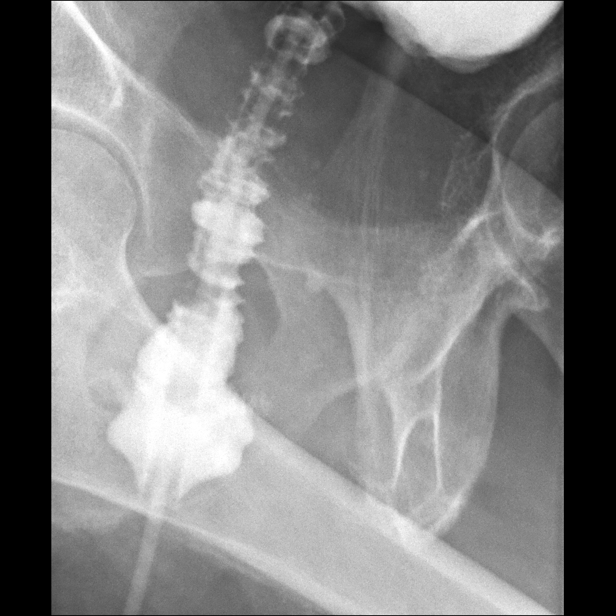
[im 3/20]
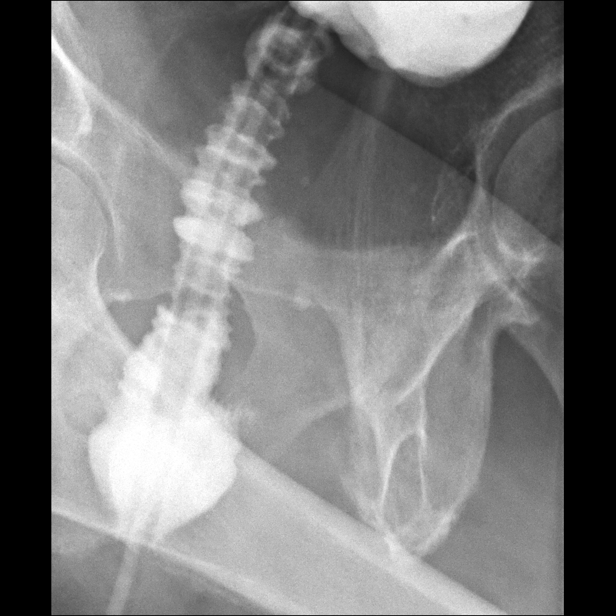
[im 6/20]
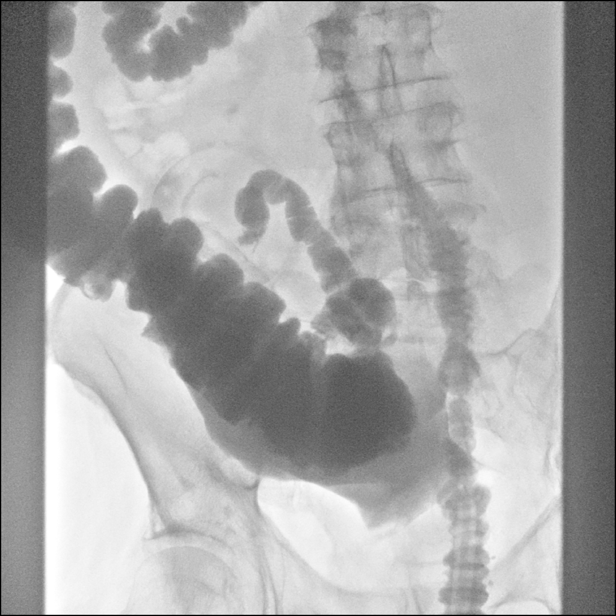
[im 9/20]
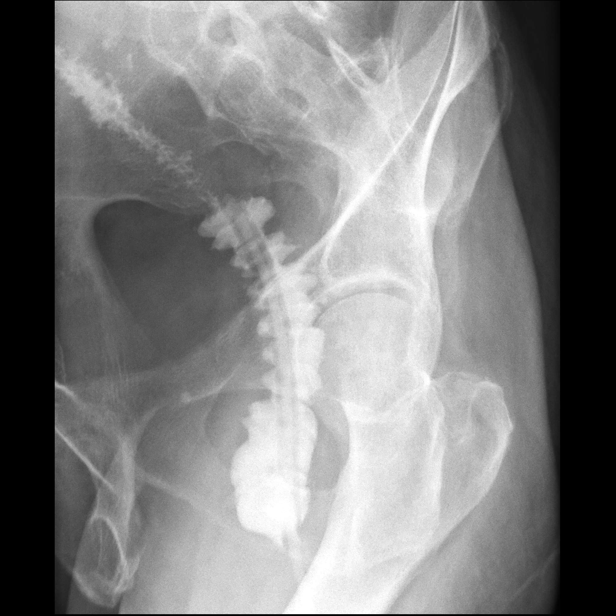
[im 12/20]
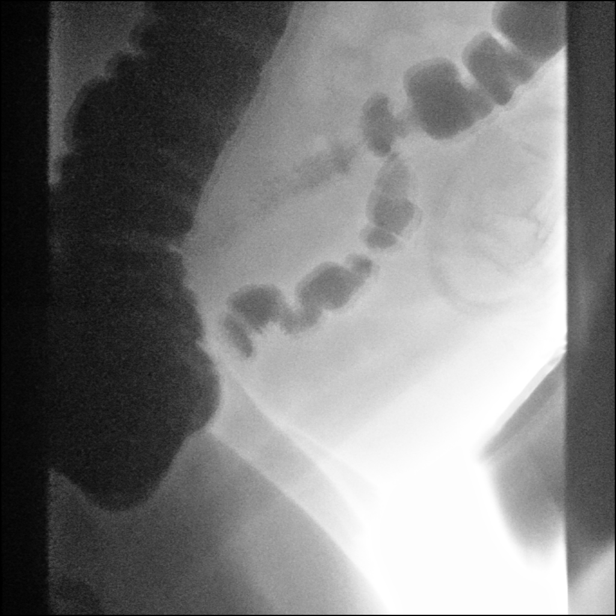
[im 14/20]
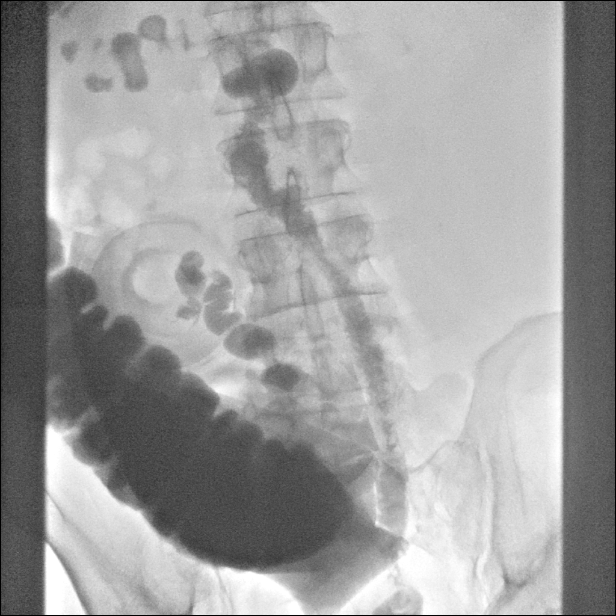
[im 16/20]
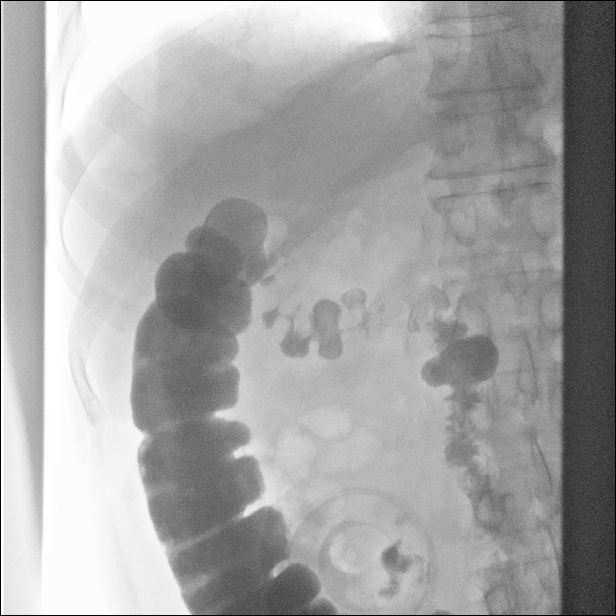
[im 20/20]
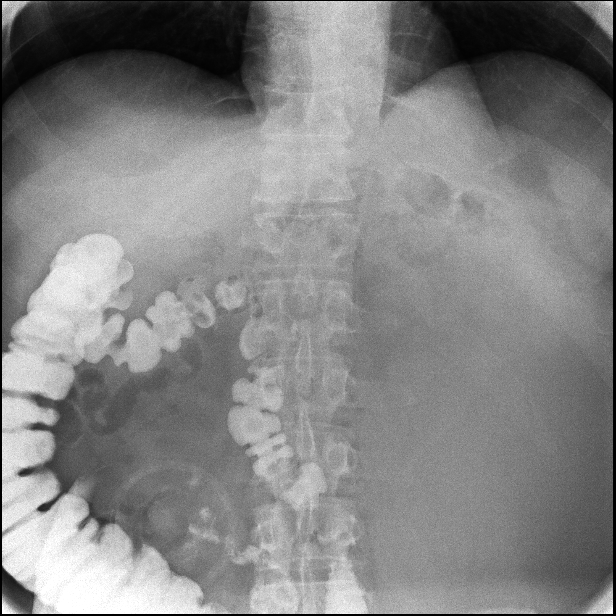

[14 of 24 positions shown; findings below may reference images not displayed]

FINDINGS: The area of the anastomosis and expected area below the anastomosis
was distended. Mild irregularity in the area of the anastomosis
without signs of change throughout the exam.

This irregularity is greatest along the LEFT lateral margin with
small focal outpouching along the LEFT lateral margin seen only in
the AP projection.

More proximal colon shows a caliber change at the level of the
splenic flexure with mildly diminished caliber of the colon distal
to the splenic flexure. The cecum was distended with contrast and
contrast filled the distal ileum passing into the patient's
ileostomy bag.
IMPRESSION: Mild irregularity at the anastomotic site without definitive signs
of leak. Lack of change during the examination argues against
evidence of leak. Clear signs of the anastomotic site i.e. suture
lines are not seen in there is no postoperative comparison available
at this time. CT may be helpful to assess anastomotic configuration
and exclude the possibility of a small contained leak though this is
felt less likely based on the lack of change seen on the current
study across multiple images.

Decreased caliber of the LEFT as compared to the RIGHT colon is of
uncertain significance, showing no signs of focal narrowing.

## 2023-01-27 NOTE — Progress Notes (Signed)
  Cardiology Office Note:   Date:  01/28/2023  ID:  William, Good 16-Jun-1955, MRN [id number] PCP: Henrine Screws, MD  [facility] Providers Cardiologist:  None {  History of Present Illness:   William Good is a 67 y.o. male.  He saw Dr. Shirlee Latch years ago chest pain.  He had a negative perfusion study.  He has rectal cancer and had resection.  He has had treatment with FOLFOX and radiation.  William Good  He was sent to [place] because of his significant risk factors.  He was found on echo to have hypertrophic cardiomyopathy.  He had severe apical hypertrophy on MRI.  LGE scar was 5%.  He had no high risk arrhythmias on monitor.    He has been managed by Dr. Izora Ribas  Plan 1 and conservative therapy.  He could not afford genetic testing.  He has not had a significant gradient on MRI.  He probably has sleep apnea but he has not wanted sleep study.  He would decline a CMI.   He actually thinks he has done well.  The patient denies any new symptoms such as chest discomfort, neck or arm discomfort. There has been no new shortness of breath, PND or orthopnea. There have been no reported palpitations, presyncope or syncope.   His most exerting activity is shopping.   ROS: As stated in the HPI and negative for all other systems.  Studies Reviewed:    EKG:   EKG Interpretation Date/Time:  Friday January 28 2023 11:38:18 EST Ventricular Rate:  60 PR Interval:  272 QRS Duration:  98 QT Interval:  466 QTC Calculation: 466 R Axis:   15  Text Interpretation: Sinus rhythm with 1st degree A-V block ST & Marked T wave abnormality, consider anterolateral ischemia Prolonged QT When compared with ECG of 30-Sep-2020 11:19, No significant change was found Confirmed by Rollene Rotunda ([id number]) on 01/28/2023 12:03:14 PM     Risk Assessment/Calculations:      Physical Exam:   VS:  BP (!) 138/90 (BP Location: Right Arm, Patient Position: Sitting, Cuff Size: Large)   Pulse 60   Ht 6'  (1.829 m)   Wt 267 lb (121.1 kg)   SpO2 93%   BMI 36.21 kg/m    Wt Readings from Last 3 Encounters:  01/28/23 267 lb (121.1 kg)  11/26/22 262 lb 12.8 oz (119.2 kg)  11/24/22 262 lb 12.8 oz (119.2 kg)     GEN: Well nourished, well developed in no acute distress NECK: No JVD; No carotid bruits CARDIAC: RRR, no murmurs, rubs, gallops RESPIRATORY:  Clear to auscultation without rales, wheezing or rhonchi  ABDOMEN: Soft, non-tender, non-distended EXTREMITIES:  No edema; No deformity   ASSESSMENT AND PLAN:   Hypertrophic apical cardiomyopathy:   He has wanted conservative therapy.  He does not want further med titration.  He is not having any symptoms.  No change in therapy.  He will continue on the meds as listed.  HTN:   His BP is mildly elevated.  He really has wanted more conservative therapy.  I will not titrate his meds.   DM: A1c was 6.5.  This is down from 7.2.  No change in therapy.    Follow up with me in six months.   Signed, Rollene Rotunda, MD

## 2023-01-28 ENCOUNTER — Ambulatory Visit: Payer: Medicare Other | Attending: Cardiology | Admitting: Cardiology

## 2023-01-28 ENCOUNTER — Encounter: Payer: Self-pay | Admitting: Cardiology

## 2023-01-28 VITALS — BP 138/90 | HR 60 | Ht 72.0 in | Wt 267.0 lb

## 2023-01-28 DIAGNOSIS — I422 Other hypertrophic cardiomyopathy: Secondary | ICD-10-CM | POA: Diagnosis not present

## 2023-01-28 DIAGNOSIS — R0602 Shortness of breath: Secondary | ICD-10-CM

## 2023-01-28 DIAGNOSIS — E118 Type 2 diabetes mellitus with unspecified complications: Secondary | ICD-10-CM

## 2023-01-28 DIAGNOSIS — I10 Essential (primary) hypertension: Secondary | ICD-10-CM | POA: Diagnosis not present

## 2023-01-28 NOTE — Patient Instructions (Signed)
Medication Instructions:  Your physician recommends that you continue on your current medications as directed. Please refer to the Current Medication list given to you today.  *If you need a refill on your cardiac medications before your next appointment, please call your pharmacy*  Follow-Up: At [facility], you and your health needs are our priority.  As part of our continuing mission to provide you with exceptional heart care, we have created designated Provider Care Teams.  These Care Teams include your primary Cardiologist (physician) and Advanced Practice Providers (APPs -  Physician Assistants and Nurse Practitioners) who all work together to provide you with the care you need, when you need it.  Your next appointment:   6 month(s)  Provider:   Rollene Rotunda, MD

## 2023-02-02 ENCOUNTER — Other Ambulatory Visit: Payer: Self-pay

## 2023-03-07 DIAGNOSIS — C20 Malignant neoplasm of rectum: Secondary | ICD-10-CM | POA: Diagnosis not present

## 2023-03-07 DIAGNOSIS — Z905 Acquired absence of kidney: Secondary | ICD-10-CM | POA: Diagnosis not present

## 2023-03-07 DIAGNOSIS — N1832 Chronic kidney disease, stage 3b: Secondary | ICD-10-CM | POA: Diagnosis not present

## 2023-03-29 DIAGNOSIS — C20 Malignant neoplasm of rectum: Secondary | ICD-10-CM | POA: Diagnosis not present

## 2023-03-29 DIAGNOSIS — E1122 Type 2 diabetes mellitus with diabetic chronic kidney disease: Secondary | ICD-10-CM | POA: Diagnosis not present

## 2023-03-29 DIAGNOSIS — N183 Chronic kidney disease, stage 3 unspecified: Secondary | ICD-10-CM | POA: Diagnosis not present

## 2023-03-29 DIAGNOSIS — E119 Type 2 diabetes mellitus without complications: Secondary | ICD-10-CM | POA: Diagnosis not present

## 2023-03-29 DIAGNOSIS — I422 Other hypertrophic cardiomyopathy: Secondary | ICD-10-CM | POA: Diagnosis not present

## 2023-03-29 DIAGNOSIS — Z0000 Encounter for general adult medical examination without abnormal findings: Secondary | ICD-10-CM | POA: Diagnosis not present

## 2023-03-29 DIAGNOSIS — G620 Drug-induced polyneuropathy: Secondary | ICD-10-CM | POA: Diagnosis not present

## 2023-03-29 DIAGNOSIS — J449 Chronic obstructive pulmonary disease, unspecified: Secondary | ICD-10-CM | POA: Diagnosis not present

## 2023-03-29 DIAGNOSIS — I10 Essential (primary) hypertension: Secondary | ICD-10-CM | POA: Diagnosis not present

## 2023-04-25 ENCOUNTER — Other Ambulatory Visit: Payer: Medicare Other

## 2023-04-25 ENCOUNTER — Inpatient Hospital Stay: Payer: Medicare Other | Attending: Oncology | Admitting: Oncology

## 2023-04-25 ENCOUNTER — Inpatient Hospital Stay: Payer: Medicare Other

## 2023-04-25 VITALS — BP 128/90 | HR 73 | Temp 98.1°F | Resp 18 | Ht 72.0 in | Wt 272.3 lb

## 2023-04-25 DIAGNOSIS — G620 Drug-induced polyneuropathy: Secondary | ICD-10-CM | POA: Diagnosis not present

## 2023-04-25 DIAGNOSIS — E785 Hyperlipidemia, unspecified: Secondary | ICD-10-CM | POA: Insufficient documentation

## 2023-04-25 DIAGNOSIS — I129 Hypertensive chronic kidney disease with stage 1 through stage 4 chronic kidney disease, or unspecified chronic kidney disease: Secondary | ICD-10-CM | POA: Insufficient documentation

## 2023-04-25 DIAGNOSIS — N189 Chronic kidney disease, unspecified: Secondary | ICD-10-CM | POA: Diagnosis not present

## 2023-04-25 DIAGNOSIS — Z860100 Personal history of colon polyps, unspecified: Secondary | ICD-10-CM | POA: Diagnosis not present

## 2023-04-25 DIAGNOSIS — G2581 Restless legs syndrome: Secondary | ICD-10-CM | POA: Insufficient documentation

## 2023-04-25 DIAGNOSIS — N132 Hydronephrosis with renal and ureteral calculous obstruction: Secondary | ICD-10-CM | POA: Diagnosis not present

## 2023-04-25 DIAGNOSIS — E1122 Type 2 diabetes mellitus with diabetic chronic kidney disease: Secondary | ICD-10-CM | POA: Diagnosis not present

## 2023-04-25 DIAGNOSIS — F319 Bipolar disorder, unspecified: Secondary | ICD-10-CM | POA: Diagnosis not present

## 2023-04-25 DIAGNOSIS — N281 Cyst of kidney, acquired: Secondary | ICD-10-CM | POA: Insufficient documentation

## 2023-04-25 DIAGNOSIS — C20 Malignant neoplasm of rectum: Secondary | ICD-10-CM | POA: Insufficient documentation

## 2023-04-25 LAB — CEA (ACCESS): CEA (CHCC): 2.26 ng/mL (ref 0.00–5.00)

## 2023-04-25 NOTE — Progress Notes (Signed)
William Good   Diagnosis: Rectal cancer  INTERVAL HISTORY:   William Good returns as scheduled.  He has irregular bowel habits.  No bleeding.  No difficulty with urination.  He has numbness in the feet.  This is most apparent in the evening.  He began a trial of gabapentin for foot tingling.  He reports constipation with bowel movements in the morning.  Objective:  Vital signs in last 24 hours:  Blood pressure (!) 128/90, pulse 73, temperature 98.1 F (36.7 C), temperature source Temporal, resp. rate 18, height 6' (1.829 m), weight 272 lb 4.8 oz (123.5 kg), SpO2 96%.   Lymphatics: No cervical, supraclavicular, axillary, or inguinal nodes Resp: Distant breath sounds, no respiratory distress Cardio: Distant heart sounds, regular rhythm GI: Masslike fullness throughout the lateral left abdomen, no hepatomegaly Vascular: No leg edema   Lab Results:  Lab Results  Component Value Date   WBC 6.1 11/24/2022   HGB 12.5 (L) 11/24/2022   HCT 40.9 11/24/2022   MCV 89.7 11/24/2022   PLT 147 (L) 11/24/2022   NEUTROABS 3.4 07/21/2022    CMP  Lab Results  Component Value Date   NA 137 11/24/2022   K 4.1 11/24/2022   CL 100 11/24/2022   CO2 26 11/24/2022   GLUCOSE 183 (H) 11/24/2022   BUN 15 11/24/2022   CREATININE 1.74 (H) 11/24/2022   CALCIUM 9.0 11/24/2022   PROT 6.5 07/21/2022   ALBUMIN 4.0 07/21/2022   AST 14 (L) 07/21/2022   ALT 11 07/21/2022   ALKPHOS 91 07/21/2022   BILITOT 0.3 07/21/2022   GFRNONAA 42 (L) 11/24/2022   GFRAA >60 01/17/2017    Lab Results  Component Value Date   CEA 2.26 04/25/2023    Medications: I have reviewed the patient's current medications.   Assessment/Plan: Rectal cancer Mass at 6 cm on digital exam/anoscopy 08/25/2020 Colonoscopy 09/05/2020-mid rectal mass-biopsy, adenocarcinoma, no loss of mismatch repair protein expression CT abdomen/pelvis 08/29/2020-small amount of free fluid in the pelvis,  mildly enlarged left external iliac node, cystic changes of the left kidney with hydronephrosis and regions of cortical thinning with transition at the left UPJ suggesting chronic left UPJ obstruction MRI pelvis 09/24/2020-tumor 6 cm from the anal verge, 1.8 cm from the internal anal sphincter, T2, 7 mm lymph node in the left mesorectal sheath, N1 Cycle 1 FOLFOX 10/08/2020 Cycle 2 FOLFOX 10/22/2020 Cycle 3 FOLFOX 11/12/2020 (oxaliplatin dose reduced, white cell growth factor support on day of pump discontinuation) Cycle 4 FOLFOX 11/26/2020, 5-FU bolus and infusion dose reduced secondary to diarrhea, Ziextenzo Cycle 5 FOLFOX 12/10/2020, 5-FU bolus held due to diarrhea, Ziextenzo Cycle 6 FOLFOX 12/24/2020, 5-FU bolus held, Ziextenzo Cycle 7 FOLFOX 01/07/2021, oxaliplatin held due to neuropathy, 5-FU bolus held, Ziextenzo held Cycle 8 FOLFOX 01/21/2021, oxaliplatin held due to neuropathy, 5-FU bolus held, Ziextenzo held Radiation/Xeloda 02/02/2021-03/16/2021 Low anterior resection/diverting loop ileostomy 05/27/2021, no residual carcinoma, 0/19 nodes, negative resection margins, ypT0,ypN0 Ileostomy takedown 09/03/2021 CTs 05/31/2022-surgical changes noted of distal colonic resection and primary anastomosis.  Maturing areas of surgical change involving small bowel in the right side of the anterior abdominal/pelvic wall.  Previous abnormal nodes in the left pelvic sidewall are improved.  Nodes in the upper retroperitoneum appear slightly smaller.  Massively enlarged left kidney.  Persistent large right-sided parapelvic renal cyst with nonobstructing renal stones. Colonoscopy 07/16/2022-polyps removed from the ascending and transverse colon-tubular adenomas     Diabetes Chronic kidney disease Bipolar disorder Hypertension Hyperlipidemia Restless leg syndrome  Multiple colon polyps removed on the colonoscopy 09/05/2020-tubular adenomas including a rectal polyp with high-grade dysplasia-polypectomy margin negative for  dysplasia Portacath placement, Dr Freida Busman, 10/02/2020 Oxaliplatin neuropathy Diarrhea following ileostomy takedown procedure.  Improved with Lomotil. Palpable left abdominal mass on exam 05/28/2022-massively enlarged left kidney; referred to urology Cystoscopy with retrograde pyelograms, placement of bilateral ureter stents, laser lithotripsy for treatment of a right UPJ stone, massively hydronephrotic left kidney     Disposition: William Budnick is in clinical remission from rectal cancer.  He has persistent oxaliplatin neuropathy symptoms and recently began a trial of gabapentin.  He is followed by Dr. Berneice Heinrich for the left hydronephrosis/nonfunctioning kidney.  He underwent ureter stent exchange in September 2024.  We discussed the indication for surveillance imaging.  He had a complete response to the neoadjuvant therapy.  He is comfortable being followed without additional imaging surveillance.  He will return for an office visit and CEA in 6 months.  He will try stopping iron to see if this helps with the irregular bowel habits.  Will check a CBC and ferritin level at the next office visit.  William Papas, MD  04/25/2023  3:20 PM

## 2023-05-02 DIAGNOSIS — E1159 Type 2 diabetes mellitus with other circulatory complications: Secondary | ICD-10-CM | POA: Diagnosis not present

## 2023-05-02 DIAGNOSIS — D509 Iron deficiency anemia, unspecified: Secondary | ICD-10-CM | POA: Diagnosis not present

## 2023-05-11 DIAGNOSIS — E1159 Type 2 diabetes mellitus with other circulatory complications: Secondary | ICD-10-CM | POA: Diagnosis not present

## 2023-05-30 DIAGNOSIS — N202 Calculus of kidney with calculus of ureter: Secondary | ICD-10-CM | POA: Diagnosis not present

## 2023-05-30 DIAGNOSIS — N3020 Other chronic cystitis without hematuria: Secondary | ICD-10-CM | POA: Diagnosis not present

## 2023-05-30 DIAGNOSIS — N130 Hydronephrosis with ureteropelvic junction obstruction: Secondary | ICD-10-CM | POA: Diagnosis not present

## 2023-05-30 DIAGNOSIS — N270 Small kidney, unilateral: Secondary | ICD-10-CM | POA: Diagnosis not present

## 2023-06-03 ENCOUNTER — Other Ambulatory Visit: Payer: Self-pay | Admitting: Urology

## 2023-06-16 NOTE — Progress Notes (Addendum)
 COVID Vaccine Completed: yes  Date of COVID positive in last 90 days:  PCP - Ruven Coy, MD Cardiologist - Eilleen Grates ,MD LOV 01/28/23 Oncologist- Dr. Scherrie Curt   Chest x-ray - n/a EKG -01/28/23 epic  Stress Test - n/a ECHO - 04/01/21 Epic Cardiac Cath - n/a Pacemaker/ICD device last checked: n/a Spinal Cord Stimulator: n/a  Bowel Prep - no  Sleep Study - n/a CPAP -   Fasting Blood Sugar - DM2 per pt, no checks at home Checks Blood Sugar _____ times a day  Last dose of GLP1 agonist-  N/A GLP1 instructions:  Hold 7 days before surgery    Last dose of SGLT-2 inhibitors-  N/A SGLT-2 instructions:  Hold 3 days before surgery    Blood Thinner Instructions:  Last dose:   Time: Aspirin Instructions:ASA 8, no instructions to hold per pt has not stopped in the past Last Dose:  Activity level: Can go up a flight of stairs and perform activities of daily living without stopping and without symptoms of chest pain. SOB with activity, pt has COPD  Anesthesia review: HTN, DM2, CAD, CKD, COPD, cardiomyopathy, A1C 9.3  Patient denies shortness of breath, fever, cough and chest pain at PAT appointment  Patient verbalized understanding of instructions that were given to them at the PAT appointment. Patient was also instructed that they will need to review over the PAT instructions again at home before surgery.

## 2023-06-17 ENCOUNTER — Encounter (HOSPITAL_COMMUNITY)
Admission: RE | Admit: 2023-06-17 | Discharge: 2023-06-17 | Disposition: A | Discharge status: Home / Self Care | Source: Ambulatory Visit | Attending: Urology | Admitting: Urology

## 2023-06-17 ENCOUNTER — Other Ambulatory Visit: Payer: Self-pay

## 2023-06-17 ENCOUNTER — Encounter (HOSPITAL_COMMUNITY): Payer: Self-pay

## 2023-06-17 VITALS — BP 164/96 | HR 72 | Resp 12 | Ht 72.0 in | Wt 267.0 lb

## 2023-06-17 DIAGNOSIS — N182 Chronic kidney disease, stage 2 (mild): Secondary | ICD-10-CM | POA: Diagnosis not present

## 2023-06-17 DIAGNOSIS — I129 Hypertensive chronic kidney disease with stage 1 through stage 4 chronic kidney disease, or unspecified chronic kidney disease: Secondary | ICD-10-CM | POA: Diagnosis not present

## 2023-06-17 DIAGNOSIS — Z7984 Long term (current) use of oral hypoglycemic drugs: Secondary | ICD-10-CM | POA: Insufficient documentation

## 2023-06-17 DIAGNOSIS — E1159 Type 2 diabetes mellitus with other circulatory complications: Secondary | ICD-10-CM | POA: Insufficient documentation

## 2023-06-17 DIAGNOSIS — Z923 Personal history of irradiation: Secondary | ICD-10-CM | POA: Diagnosis not present

## 2023-06-17 DIAGNOSIS — Z87891 Personal history of nicotine dependence: Secondary | ICD-10-CM | POA: Insufficient documentation

## 2023-06-17 DIAGNOSIS — Z01812 Encounter for preprocedural laboratory examination: Secondary | ICD-10-CM | POA: Insufficient documentation

## 2023-06-17 DIAGNOSIS — N1330 Unspecified hydronephrosis: Secondary | ICD-10-CM | POA: Diagnosis not present

## 2023-06-17 DIAGNOSIS — J449 Chronic obstructive pulmonary disease, unspecified: Secondary | ICD-10-CM | POA: Insufficient documentation

## 2023-06-17 DIAGNOSIS — Z85048 Personal history of other malignant neoplasm of rectum, rectosigmoid junction, and anus: Secondary | ICD-10-CM | POA: Insufficient documentation

## 2023-06-17 DIAGNOSIS — I2510 Atherosclerotic heart disease of native coronary artery without angina pectoris: Secondary | ICD-10-CM | POA: Diagnosis not present

## 2023-06-17 DIAGNOSIS — E1122 Type 2 diabetes mellitus with diabetic chronic kidney disease: Secondary | ICD-10-CM | POA: Diagnosis not present

## 2023-06-17 HISTORY — DX: Personal history of urinary calculi: Z87.442

## 2023-06-17 HISTORY — DX: Chronic obstructive pulmonary disease, unspecified: J44.9

## 2023-06-17 HISTORY — DX: Depression, unspecified: F32.A

## 2023-06-17 LAB — GLUCOSE, CAPILLARY: Glucose-Capillary: 243 mg/dL — ABNORMAL HIGH (ref 70–99)

## 2023-06-17 LAB — BASIC METABOLIC PANEL WITH GFR
Anion gap: 9 (ref 5–15)
BUN: 16 mg/dL (ref 8–23)
CO2: 27 mmol/L (ref 22–32)
Calcium: 8.6 mg/dL — ABNORMAL LOW (ref 8.9–10.3)
Chloride: 99 mmol/L (ref 98–111)
Creatinine, Ser: 1.36 mg/dL — ABNORMAL HIGH (ref 0.61–1.24)
GFR, Estimated: 57 mL/min — ABNORMAL LOW (ref >60)
Glucose, Bld: 240 mg/dL — ABNORMAL HIGH (ref 70–99)
Potassium: 4.6 mmol/L (ref 3.5–5.1)
Sodium: 135 mmol/L (ref 135–145)

## 2023-06-17 LAB — CBC
HCT: 51.6 % (ref 39.0–52.0)
Hemoglobin: 16.7 g/dL (ref 13.0–17.0)
MCH: 32.2 pg (ref 26.0–34.0)
MCHC: 32.4 g/dL (ref 30.0–36.0)
MCV: 99.4 fL (ref 80.0–100.0)
Platelets: 132 10*3/uL — ABNORMAL LOW (ref 150–400)
RBC: 5.19 MIL/uL (ref 4.22–5.81)
RDW: 11.8 % (ref 11.5–15.5)
WBC: 6.9 10*3/uL (ref 4.0–10.5)
nRBC: 0 % (ref 0.0–0.2)

## 2023-06-17 NOTE — Patient Instructions (Signed)
 SURGICAL WAITING ROOM VISITATION  Patients having surgery or a procedure may have no more than 2 support people in the waiting area - these visitors may rotate.    Children under the age of 40 must have an adult with them who is not the patient.  Due to an increase in RSV and influenza rates and associated hospitalizations, children ages 31 and under may not visit patients in [facility] hospitals.  Visitors with respiratory illnesses are discouraged from visiting and should remain at home.  If the patient needs to stay at the hospital during part of their recovery, the visitor guidelines for inpatient rooms apply. Pre-op nurse will coordinate an appropriate time for 1 support person to accompany patient in pre-op.  This support person may not rotate.    Please refer to the [facility] website for the visitor guidelines for Inpatients (after your surgery is over and you are in a regular room).    Your procedure is scheduled on: 06/22/23   Report to [facility] Main Entrance    Report to admitting at 1:15 PM   Call this number if you have problems the morning of surgery [phone]   Do not eat food or drink liquids :After Midnight.          If you have questions, please contact your surgeon's office.   FOLLOW BOWEL PREP AND ANY ADDITIONAL PRE OP INSTRUCTIONS YOU RECEIVED FROM YOUR SURGEON'S OFFICE!!!     Oral Hygiene is also important to reduce your risk of infection.                                    Remember - BRUSH YOUR TEETH THE MORNING OF SURGERY WITH YOUR REGULAR TOOTHPASTE  DENTURES WILL BE REMOVED PRIOR TO SURGERY PLEASE DO NOT APPLY "Poly grip" OR ADHESIVES!!!   Do NOT smoke after Midnight   Stop all vitamins and herbal supplements 7 days before surgery.   Take these medicines the morning of surgery with A SIP OF WATER: Tylenol, Alprazolam, Hydrocodone, Omeprazole, Zofran, Pravastatin, Propranolol   DO NOT TAKE ANY ORAL DIABETIC MEDICATIONS DAY OF YOUR  SURGERY  How to Manage Your Diabetes Before and After Surgery  Why is it important to control my blood sugar before and after surgery? Improving blood sugar levels before and after surgery helps healing and can limit problems. A way of improving blood sugar control is eating a healthy diet by:  Eating less sugar and carbohydrates  Increasing activity/exercise  Talking with your doctor about reaching your blood sugar goals High blood sugars (greater than 180 mg/dL) can raise your risk of infections and slow your recovery, so you will need to focus on controlling your diabetes during the weeks before surgery. Make sure that the doctor who takes care of your diabetes knows about your planned surgery including the date and location.  How do I manage my blood sugar before surgery? Check your blood sugar at least 4 times a day, starting 2 days before surgery, to make sure that the level is not too high or low. Check your blood sugar the morning of your surgery when you wake up and every 2 hours until you get to the Short Stay unit. If your blood sugar is less than 70 mg/dL, you will need to treat for low blood sugar: Do not take insulin. Treat a low blood sugar (less than 70 mg/dL) with  cup  of clear juice (cranberry or apple), 4 glucose tablets, OR glucose gel. Recheck blood sugar in 15 minutes after treatment (to make sure it is greater than 70 mg/dL). If your blood sugar is not greater than 70 mg/dL on recheck, call [phone] for further instructions. Report your blood sugar to the short stay nurse when you get to Short Stay.  If you are admitted to the hospital after surgery: Your blood sugar will be checked by the staff and you will probably be given insulin after surgery (instead of oral diabetes medicines) to make sure you have good blood sugar levels. The goal for blood sugar control after surgery is 80-180 mg/dL.   WHAT DO I DO ABOUT MY DIABETES MEDICATION?  Do not take oral  diabetes medicines (pills) the morning of surgery.  THE DAY BEFORE SURGERY, take only morning dose of Glipizide, no afternoon or evening dose.     THE MORNING OF SURGERY, do not take Glipizide.  Reviewed and Endorsed by [facility] Patient Education Committee, August 2015                              You may not have any metal on your body including jewelry, and body piercing             Do not wear lotions, powders, cologne, or deodorant              Men may shave face and neck.   Do not bring valuables to the hospital. [facility] IS NOT             RESPONSIBLE   FOR VALUABLES.   Contacts, glasses, dentures or bridgework may not be worn into surgery.  DO NOT BRING YOUR HOME MEDICATIONS TO THE HOSPITAL. PHARMACY WILL DISPENSE MEDICATIONS LISTED ON YOUR MEDICATION LIST TO YOU DURING YOUR ADMISSION IN THE HOSPITAL!    Patients discharged on the day of surgery will not be allowed to drive home.  Someone NEEDS to stay with you for the first 24 hours after anesthesia.              Please read over the following fact sheets you were given: IF YOU HAVE QUESTIONS ABOUT YOUR PRE-OP INSTRUCTIONS PLEASE CALL [phone] Contras    If you received a COVID test during your pre-op visit  it is requested that you wear a mask when out in public, stay away from anyone that may not be feeling well and notify your surgeon if you develop symptoms. If you test positive for Covid or have been in contact with anyone that has tested positive in the last 10 days please notify you surgeon.    [place] - Preparing for Surgery Before surgery, you can play an important role.  Because skin is not sterile, your skin needs to be as free of germs as possible.  You can reduce the number of germs on your skin by washing with CHG (chlorahexidine gluconate) soap before surgery.  CHG is an antiseptic cleaner which kills germs and bonds with the skin to continue killing germs even after washing. Please DO NOT use if  you have an allergy to CHG or antibacterial soaps.  If your skin becomes reddened/irritated stop using the CHG and inform your nurse when you arrive at Short Stay. Do not shave (including legs and underarms) for at least 48 hours prior to the first CHG shower.  You may shave your face/neck.  Please follow these instructions carefully:  1.  Shower with CHG Soap the night before surgery and the  morning of surgery.  2.  If you choose to wash your hair, wash your hair first as usual with your normal  shampoo.  3.  After you shampoo, rinse your hair and body thoroughly to remove the shampoo.                             4.  Use CHG as you would any other liquid soap.  You can apply chg directly to the skin and wash.  Gently with a scrungie or clean washcloth.  5.  Apply the CHG Soap to your body ONLY FROM THE NECK DOWN.   Do   not use on face/ open                           Wound or open sores. Avoid contact with eyes, ears mouth and   genitals (private parts).                       Wash face,  Genitals (private parts) with your normal soap.             6.  Wash thoroughly, paying special attention to the area where your    surgery  will be performed.  7.  Thoroughly rinse your body with warm water from the neck down.  8.  DO NOT shower/wash with your normal soap after using and rinsing off the CHG Soap.                9.  Pat yourself dry with a clean towel.            10.  Wear clean pajamas.            11.  Place clean sheets on your bed the night of your first shower and do not  sleep with pets. Day of Surgery : Do not apply any lotions/deodorants the morning of surgery.  Please wear clean clothes to the hospital/surgery center.  FAILURE TO FOLLOW THESE INSTRUCTIONS MAY RESULT IN THE CANCELLATION OF YOUR SURGERY  PATIENT SIGNATURE_________________________________  NURSE SIGNATURE__________________________________  ________________________________________________________________________

## 2023-06-18 LAB — HEMOGLOBIN A1C
Hgb A1c MFr Bld: 9.3 % — ABNORMAL HIGH (ref 4.8–5.6)
Mean Plasma Glucose: 220 mg/dL

## 2023-06-20 NOTE — Progress Notes (Signed)
 A1C 9.3, results routed to Dr. Secundino Dach

## 2023-06-20 NOTE — Progress Notes (Signed)
 Anesthesia Chart Review   Case: [id number] Date/Time: 06/22/23 1515   Procedures:      CYSTOURETEROSCOPY, WITH STENT INSERTION (Left)     CYSTOSCOPY, WITH RETROGRADE PYELOGRAM (Left)   Anesthesia type: General   Diagnosis: Hydronephrosis of left kidney [N13.30]   Pre-op diagnosis: LEFT HYDRONEPHROSIS   [facility] 03 [facility]   Surgeons: Melody Spurling., MD       DISCUSSION:67 y.o. former smoker with h/o HTN, COPD, CAD, hypertrophic cardiomyopathy, DM II, CKD stage II, rectal cancer status postresection, status posttreatment with FOLFOX and radiation, left hydronephrosis scheduled for above procedure 06/22/2023 with Dr. Osborn Blaze.  A1c 9.3 on 06/17/2023.  Forwarded to PCP and Dr. Secundino Dach.  Evaluate CBG today of surgery.  Patient last seen by cardiology 01/28/2023.  Per office visit note, "Hypertrophic apical cardiomyopathy:   He has wanted conservative therapy.  He does not want further med titration.  He is not having any symptoms.  No change in therapy.  He will continue on the meds as listed."  VS: BP (!) 164/96   Pulse 72   Resp 12   Ht 6' (1.829 m)   Wt 121.1 kg   SpO2 96%   BMI 36.21 kg/m   PROVIDERS: Ruven Coy, MD is PCP   LABS: Labs reviewed: Acceptable for surgery. (all labs ordered are listed, but only abnormal results are displayed)  Labs Reviewed  HEMOGLOBIN A1C - Abnormal; Notable for the following components:      Result Value   Hgb A1c MFr Bld 9.3 (*)    All other components within normal limits  BASIC METABOLIC PANEL WITH GFR - Abnormal; Notable for the following components:   Glucose, Bld 240 (*)    Creatinine, Ser 1.36 (*)    Calcium 8.6 (*)    GFR, Estimated 57 (*)    All other components within normal limits  CBC - Abnormal; Notable for the following components:   Platelets 132 (*)    All other components within normal limits  GLUCOSE, CAPILLARY - Abnormal; Notable for the following components:   Glucose-Capillary 243 (*)     All other components within normal limits     IMAGES:   EKG:   CV: Cardiac MRI 01/20/2022 IMPRESSION: 1. Normal left ventricular size and systolic function.  LVEF = 66%).   2. There is severe apical left ventricular hypertrophy, with apical walls measuring upto 1.9 cm in max diameter.   3. 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   4. There is LGE (scar) in the midwall of the apical left ventricular myocardium (areas of maximal thickness).   5. LGE/scar tissue comprises about 5% of total myocardial mass.   6. Mild basal to mid LV wall thickness, with no LVOT obstruction.   7. Findings consistent with hypertrophic cardiomyopathy (HCM), apical variant.  Echo 04/01/2021  1. There is severe asymmetric hypertrophy of the apical segments (up to  18 mm) consistent with apical variant hypertrophic cardiomyopathy. There  is concern for an apical aneurysm on this study, however this was a  difficult study. Would recommend cardiac   MRI for better characterization. Left ventricular ejection fraction, by  estimation, is 55 to 60%. The left ventricle has normal function. The left  ventricle has no regional wall motion abnormalities. There is severe  asymmetric left ventricular  hypertrophy of the apical segment. Left ventricular diastolic parameters  are consistent with Grade I diastolic dysfunction (impaired relaxation).   2. Right ventricular systolic function is  normal. The right ventricular  size is normal. Tricuspid regurgitation signal is inadequate for assessing  PA pressure.   3. The mitral valve is grossly normal. Trivial mitral valve  regurgitation. No evidence of mitral stenosis.   4. The aortic valve is grossly normal. Aortic valve regurgitation is not  visualized. No aortic stenosis is present.   5. The inferior vena cava is normal in size with greater than 50%  respiratory variability, suggesting right atrial pressure of 3 mmHg.  Past Medical History:  Diagnosis Date    Anginal pain (HCC)    Anxiety    Back pain    Bipolar disorder (HCC)    bipolar 1   CKD (chronic kidney disease) stage 2, GFR 60-89 ml/min 09/24/2020   COPD (chronic obstructive pulmonary disease) (HCC)    Coronary artery disease    Depression    GERD (gastroesophageal reflux disease)    High output ileostomy (HCC) 05/29/2021   History of kidney stones    Hypertension    Knee pain    Migraine    Palpitation    Rectal cancer (HCC)    Type 2 diabetes mellitus with complication, without long-term current use of insulin (HCC) 03/19/2021   no meds    Past Surgical History:  Procedure Laterality Date   BACK SURGERY     CYSTOSCOPY W/ URETERAL STENT REMOVAL Right 11/26/2022   Procedure: CYSTOSCOPY WITH STENT REMOVAL;  Surgeon: Loletta Parish., MD;  [facility];  Service: Urology;  Laterality: Right;   CYSTOSCOPY WITH RETROGRADE PYELOGRAM, URETEROSCOPY AND STENT PLACEMENT Bilateral 08/13/2022   Procedure: CYSTOSCOPY WITH BILATERAL RETROGRADE PYELOGRAMS, RIGHT  URETEROSCOPY AND BILATERAL STENT PLACEMENT;  Surgeon: Loletta Parish., MD;  [facility];  Service: Urology;  Laterality: Bilateral;  75 MINS   CYSTOSCOPY WITH STENT PLACEMENT Left 11/26/2022   Procedure: CYSTOSCOPY WITH STENT EXCHANGE;  Surgeon: Loletta Parish., MD;  [facility];  Service: Urology;  Laterality: Left;   HOLMIUM LASER APPLICATION Right 08/13/2022   Procedure: HOLMIUM LASER APPLICATION;  Surgeon: Loletta Parish., MD;  [facility];  Service: Urology;  Laterality: Right;   ILEO LOOP DIVERSION N/A 09/03/2021   Procedure: OPEN TAKEDOWN OF LOOP ILEOSTOMY;  Surgeon: Karie Soda, MD;  [facility];  Service: General;  Laterality: N/A;   JOINT REPLACEMENT Right    knee   OSTOMY N/A 05/27/2021   Procedure: POSSIBLE OSTOMY;  Surgeon: Karie Soda, MD;  [facility];  Service: General;  Laterality: N/A;   PORT-A-CATH REMOVAL Right 09/03/2021   Procedure: REMOVAL PORT-A-CATH;   Surgeon: Karie Soda, MD;  [facility];  Service: General;  Laterality: Right;   PORTACATH PLACEMENT N/A 10/02/2020   Procedure: PORT-A-CATH PLACEMENT;  Surgeon: Fritzi Mandes, MD;  [facility];  Service: General;  Laterality: N/A;   PROCTOSCOPY N/A 05/27/2021   Procedure: RIGID PROCTOSCOPY;  Surgeon: Karie Soda, MD;  [facility];  Service: General;  Laterality: N/A;   XI ROBOTIC ASSISTED LOWER ANTERIOR RESECTION N/A 05/27/2021   Procedure: ROBOTIC LOW ANTERIOR COLON RESECTION, VERY LOW COLOANAL ANASTOMOSIS, DIVERTING ILEOSTOMY, BILATERAL TAP BLOCK,  FIRFLY INJECTION;  Surgeon: Karie Soda, MD;  [facility];  Service: General;  Laterality: N/A;    MEDICATIONS:  acetaminophen (TYLENOL) 325 MG tablet   ALPRAZolam (XANAX) 1 MG tablet   aspirin EC 81 MG tablet   Cholecalciferol (VITAMIN D3) 50 MCG (2000 UT) capsule   diphenoxylate-atropine (LOMOTIL) 2.5-0.025 MG tablet  glipiZIDE (GLUCOTROL XL) 10 MG 24 hr tablet   HYDROcodone-acetaminophen (NORCO/VICODIN) 5-325 MG tablet   METAMUCIL FIBER PO   mirtazapine (REMERON) 45 MG tablet   omeprazole (PRILOSEC) 20 MG capsule   ondansetron (ZOFRAN) 8 MG tablet   pravastatin (PRAVACHOL) 40 MG tablet   propranolol ER (INDERAL LA) 160 MG SR capsule   SUMAtriptan (IMITREX) 100 MG tablet   tamsulosin (FLOMAX) 0.4 MG CAPS capsule   traZODone (DESYREL) 50 MG tablet   No current facility-administered medications for this encounter.   Chick Cotton Ward, PA-C [facility] [phone]

## 2023-06-22 ENCOUNTER — Ambulatory Visit (HOSPITAL_BASED_OUTPATIENT_CLINIC_OR_DEPARTMENT_OTHER): Admitting: Anesthesiology

## 2023-06-22 ENCOUNTER — Ambulatory Visit (HOSPITAL_COMMUNITY)

## 2023-06-22 ENCOUNTER — Other Ambulatory Visit: Payer: Self-pay

## 2023-06-22 ENCOUNTER — Encounter (HOSPITAL_COMMUNITY): Payer: Self-pay | Admitting: Urology

## 2023-06-22 ENCOUNTER — Encounter (HOSPITAL_COMMUNITY)
Admission: RE | Disposition: A | Discharge status: Home / Self Care | Payer: Self-pay | Source: Home / Self Care | Attending: Urology

## 2023-06-22 ENCOUNTER — Ambulatory Visit (HOSPITAL_COMMUNITY): Payer: Self-pay | Admitting: Physician Assistant

## 2023-06-22 ENCOUNTER — Ambulatory Visit (HOSPITAL_COMMUNITY)
Admission: RE | Admit: 2023-06-22 | Discharge: 2023-06-22 | Disposition: A | Discharge status: Home / Self Care | Attending: Urology | Admitting: Urology

## 2023-06-22 DIAGNOSIS — I2510 Atherosclerotic heart disease of native coronary artery without angina pectoris: Secondary | ICD-10-CM | POA: Insufficient documentation

## 2023-06-22 DIAGNOSIS — N1832 Chronic kidney disease, stage 3b: Secondary | ICD-10-CM | POA: Diagnosis not present

## 2023-06-22 DIAGNOSIS — E119 Type 2 diabetes mellitus without complications: Secondary | ICD-10-CM

## 2023-06-22 DIAGNOSIS — Z87891 Personal history of nicotine dependence: Secondary | ICD-10-CM | POA: Insufficient documentation

## 2023-06-22 DIAGNOSIS — Z466 Encounter for fitting and adjustment of urinary device: Secondary | ICD-10-CM | POA: Insufficient documentation

## 2023-06-22 DIAGNOSIS — F319 Bipolar disorder, unspecified: Secondary | ICD-10-CM | POA: Insufficient documentation

## 2023-06-22 DIAGNOSIS — E6689 Other obesity not elsewhere classified: Secondary | ICD-10-CM | POA: Diagnosis not present

## 2023-06-22 DIAGNOSIS — N1330 Unspecified hydronephrosis: Secondary | ICD-10-CM

## 2023-06-22 DIAGNOSIS — Z9049 Acquired absence of other specified parts of digestive tract: Secondary | ICD-10-CM | POA: Insufficient documentation

## 2023-06-22 DIAGNOSIS — E1122 Type 2 diabetes mellitus with diabetic chronic kidney disease: Secondary | ICD-10-CM | POA: Insufficient documentation

## 2023-06-22 DIAGNOSIS — R6250 Unspecified lack of expected normal physiological development in childhood: Secondary | ICD-10-CM | POA: Diagnosis not present

## 2023-06-22 DIAGNOSIS — Z85038 Personal history of other malignant neoplasm of large intestine: Secondary | ICD-10-CM | POA: Insufficient documentation

## 2023-06-22 DIAGNOSIS — I129 Hypertensive chronic kidney disease with stage 1 through stage 4 chronic kidney disease, or unspecified chronic kidney disease: Secondary | ICD-10-CM | POA: Diagnosis not present

## 2023-06-22 DIAGNOSIS — Z6836 Body mass index (BMI) 36.0-36.9, adult: Secondary | ICD-10-CM | POA: Diagnosis not present

## 2023-06-22 DIAGNOSIS — N131 Hydronephrosis with ureteral stricture, not elsewhere classified: Secondary | ICD-10-CM | POA: Insufficient documentation

## 2023-06-22 DIAGNOSIS — E1159 Type 2 diabetes mellitus with other circulatory complications: Secondary | ICD-10-CM

## 2023-06-22 DIAGNOSIS — N130 Hydronephrosis with ureteropelvic junction obstruction: Secondary | ICD-10-CM | POA: Diagnosis not present

## 2023-06-22 DIAGNOSIS — E785 Hyperlipidemia, unspecified: Secondary | ICD-10-CM

## 2023-06-22 DIAGNOSIS — K219 Gastro-esophageal reflux disease without esophagitis: Secondary | ICD-10-CM | POA: Diagnosis not present

## 2023-06-22 DIAGNOSIS — J449 Chronic obstructive pulmonary disease, unspecified: Secondary | ICD-10-CM | POA: Diagnosis not present

## 2023-06-22 HISTORY — PX: CYSTOSCOPY WITH URETEROSCOPY AND STENT PLACEMENT: SHX6377

## 2023-06-22 HISTORY — PX: CYSTOSCOPY W/ RETROGRADES: SHX1426

## 2023-06-22 LAB — GLUCOSE, CAPILLARY
Glucose-Capillary: 240 mg/dL — ABNORMAL HIGH (ref 70–99)
Glucose-Capillary: 225 mg/dL — ABNORMAL HIGH (ref 70–99)
Glucose-Capillary: 207 mg/dL — ABNORMAL HIGH (ref 70–99)

## 2023-06-22 SURGERY — CYSTOURETEROSCOPY, WITH STENT INSERTION
Anesthesia: General | Laterality: Left

## 2023-06-22 MED ORDER — LACTATED RINGERS IV SOLN
INTRAVENOUS | Status: DC | PRN
Start: 2023-06-22 — End: 2023-06-22

## 2023-06-22 MED ORDER — AMISULPRIDE (ANTIEMETIC) 5 MG/2ML IV SOLN: 10.0000 mg | Freq: Once | INTRAVENOUS | Status: DC | PRN

## 2023-06-22 MED ORDER — HYDROCODONE-ACETAMINOPHEN 5-325 MG PO TABS: 1.0000 | ORAL_TABLET | Freq: Four times a day (QID) | ORAL | 0 refills | Status: AC | PRN

## 2023-06-22 MED ORDER — DEXAMETHASONE SODIUM PHOSPHATE 10 MG/ML IJ SOLN
INTRAMUSCULAR | Status: AC
Start: 2023-06-22 — End: ?
  Filled 2023-06-22: qty 1

## 2023-06-22 MED ORDER — SODIUM CHLORIDE 0.9 % IV SOLN: 12.5000 mg | INTRAVENOUS | Status: DC | PRN

## 2023-06-22 MED ORDER — ONDANSETRON HCL 4 MG/2ML IJ SOLN
INTRAMUSCULAR | Status: DC | PRN
  Administered 2023-06-22: 4 mg via INTRAVENOUS

## 2023-06-22 MED ORDER — FENTANYL CITRATE (PF) 100 MCG/2ML IJ SOLN
INTRAMUSCULAR | Status: AC
Start: 2023-06-22 — End: ?
  Filled 2023-06-22: qty 2

## 2023-06-22 MED ORDER — SODIUM CHLORIDE 0.9 % IR SOLN
Status: DC | PRN
  Administered 2023-06-22: 3000 mL via INTRAVESICAL

## 2023-06-22 MED ORDER — OXYCODONE HCL 5 MG PO TABS: 5.0000 mg | ORAL_TABLET | Freq: Once | ORAL | Status: DC | PRN

## 2023-06-22 MED ORDER — LIDOCAINE HCL (CARDIAC) PF 100 MG/5ML IV SOSY
PREFILLED_SYRINGE | INTRAVENOUS | Status: DC | PRN
  Administered 2023-06-22: 80 mg via INTRAVENOUS

## 2023-06-22 MED ORDER — LACTATED RINGERS IV SOLN: INTRAVENOUS | Status: DC

## 2023-06-22 MED ORDER — INSULIN ASPART 100 UNIT/ML IJ SOLN
0.0000 [IU] | INTRAMUSCULAR | Status: AC | PRN
  Administered 2023-06-22: 6 [IU] via SUBCUTANEOUS
  Filled 2023-06-22: qty 1

## 2023-06-22 MED ORDER — INSULIN ASPART 100 UNIT/ML IJ SOLN
INTRAMUSCULAR | Status: AC
Start: 2023-06-22 — End: 2023-06-22
  Administered 2023-06-22: 6 [IU] via SUBCUTANEOUS
  Filled 2023-06-22: qty 1

## 2023-06-22 MED ORDER — FENTANYL CITRATE (PF) 100 MCG/2ML IJ SOLN
INTRAMUSCULAR | Status: DC | PRN
  Administered 2023-06-22 (×2): 50 ug via INTRAVENOUS

## 2023-06-22 MED ORDER — LIDOCAINE HCL (PF) 2 % IJ SOLN
INTRAMUSCULAR | Status: AC
  Filled 2023-06-22: qty 5

## 2023-06-22 MED ORDER — DEXTROSE 5 % IV SOLN
5.0000 mg/kg | INTRAVENOUS | Status: AC
  Administered 2023-06-22: 620 mg via INTRAVENOUS
  Filled 2023-06-22: qty 15.5

## 2023-06-22 MED ORDER — OXYCODONE HCL 5 MG/5ML PO SOLN: 5.0000 mg | Freq: Once | ORAL | Status: DC | PRN

## 2023-06-22 MED ORDER — IOHEXOL 300 MG/ML  SOLN
INTRAMUSCULAR | Status: DC | PRN
  Administered 2023-06-22: 10 mL

## 2023-06-22 MED ORDER — DEXAMETHASONE SODIUM PHOSPHATE 10 MG/ML IJ SOLN
INTRAMUSCULAR | Status: DC | PRN
  Administered 2023-06-22: 5 mg via INTRAVENOUS

## 2023-06-22 MED ORDER — CHLORHEXIDINE GLUCONATE 0.12 % MT SOLN
15.0000 mL | Freq: Once | OROMUCOSAL | Status: AC
  Administered 2023-06-22: 15 mL via OROMUCOSAL

## 2023-06-22 MED ORDER — PROPOFOL 10 MG/ML IV BOLUS
INTRAVENOUS | Status: DC | PRN
  Administered 2023-06-22: 200 mg via INTRAVENOUS

## 2023-06-22 MED ORDER — PROPOFOL 10 MG/ML IV BOLUS
INTRAVENOUS | Status: AC
  Filled 2023-06-22: qty 20

## 2023-06-22 MED ORDER — ORAL CARE MOUTH RINSE: 15.0000 mL | Freq: Once | OROMUCOSAL | Status: AC

## 2023-06-22 MED ORDER — HYDROMORPHONE HCL 1 MG/ML IJ SOLN: 0.2500 mg | INTRAMUSCULAR | Status: DC | PRN

## 2023-06-22 MED ORDER — ONDANSETRON HCL 4 MG/2ML IJ SOLN
INTRAMUSCULAR | Status: AC
Start: 2023-06-22 — End: ?
  Filled 2023-06-22: qty 2

## 2023-06-22 SURGICAL SUPPLY — 15 items
BAG URO CATCHER STRL LF (MISCELLANEOUS) ×1 IMPLANT
BASKET ZERO TIP NITINOL 2.4FR (BASKET) IMPLANT
CATH URETL OPEN END 6FR 70 (CATHETERS) IMPLANT
CLOTH BEACON ORANGE TIMEOUT ST (SAFETY) ×1 IMPLANT
GLOVE SURG LX STRL 7.5 STRW (GLOVE) ×1 IMPLANT
GOWN STRL REUS W/ TWL XL LVL3 (GOWN DISPOSABLE) ×1 IMPLANT
GUIDEWIRE ANG ZIPWIRE 038X150 (WIRE) ×1 IMPLANT
GUIDEWIRE STR DUAL SENSOR (WIRE) ×1 IMPLANT
KIT TURNOVER KIT A (KITS) IMPLANT
MANIFOLD NEPTUNE II (INSTRUMENTS) ×1 IMPLANT
NS IRRIG 1000ML POUR BTL (IV SOLUTION) IMPLANT
PACK CYSTO (CUSTOM PROCEDURE TRAY) ×1 IMPLANT
STENT URET 6FRX26 CONTOUR (STENTS) IMPLANT
TUBE PU 8FR 16IN ENFIT (TUBING) IMPLANT
TUBING CONNECTING 10 (TUBING) ×1 IMPLANT

## 2023-06-22 NOTE — Transfer of Care (Signed)
 Immediate Anesthesia Transfer of Care Note  Patient: William Good  Procedure(s) Performed: CYSTOSCOPY WITH LEFT STENT EXCHANGE (Left) CYSTOSCOPY, WITH RETROGRADE PYELOGRAM (Left)  Patient Location: PACU  Anesthesia Type:General  Level of Consciousness: awake and alert   Airway & Oxygen Therapy: Patient Spontanous Breathing and Patient connected to nasal cannula oxygen  Post-op Assessment: Report given to RN and Post -op Vital signs reviewed and stable  Post vital signs: Reviewed and stable  Last Vitals:  Vitals Value Taken Time  BP 144/96 06/22/23 1640  Temp    Pulse 68 06/22/23 1641  Resp 12 06/22/23 1641  SpO2 96 % 06/22/23 1641  Vitals shown include unfiled device data.  Last Pain:  Vitals:   06/22/23 1354  TempSrc:   PainSc: 3          Complications: No notable events documented.

## 2023-06-22 NOTE — Brief Op Note (Signed)
 06/22/2023  4:25 PM  PATIENT:  William Good  68 y.o. male  PRE-OPERATIVE DIAGNOSIS:  LEFT HYDRONEPHROSIS  POST-OPERATIVE DIAGNOSIS:  LEFT HYDRONEPHROSIS  PROCEDURE:  Procedure(s): CYSTOSCOPY WITH LEFT STENT EXCHANGE (Left) CYSTOSCOPY, WITH RETROGRADE PYELOGRAM (Left)  SURGEON:  Surgeons and Role:    * Manny, Harvey Linen., MD - Primary  PHYSICIAN ASSISTANT:   ASSISTANTS: none   ANESTHESIA:   general  EBL:  minimal   BLOOD ADMINISTERED:none  DRAINS: none   LOCAL MEDICATIONS USED:  NONE  SPECIMEN:  Source of Specimen:  left ureteral stent  DISPOSITION OF SPECIMEN:   discard  COUNTS:  YES  TOURNIQUET:  * No tourniquets in log *  DICTATION: .Other Dictation: Dictation Number [id number]  PLAN OF CARE: Discharge to home after PACU  PATIENT DISPOSITION:  PACU - hemodynamically stable.   Delay start of Pharmacological VTE agent (>24hrs) due to surgical blood loss or risk of bleeding: yes

## 2023-06-22 NOTE — H&P (Signed)
 William Good is an 68 y.o. male.    Chief Complaint: Pre-OP LEFT Ureteral Stent Exchange  HPI:   1 - Left Atrophic Kidney / Recurrent Bacteruria - massively hydronephrotic left kidney (likely congeintial UPJO) on imagign x several includign 2024 with recurrent Klebsiella bacteruria (ses cipro). Renogram with 6% relative function. Given very poor functional capacity, elected initial management with chronic stening.   Recent Course:  08/2022 - Left JJ stent placed (Also Rt URS and stent for sotne in funciotnally solitary kidney)  11/2022 - Left JJ stent (6x26 contour) replaced / rt stent removed   2 - Right Renal Cyst / R/O UPJ Stone - atbou 6cm Rt renal cyst that extends into renal sinus. ? UPJ area calcification on CT 2024 that was not present 2022. This is his dominant functional kidney.   3- Prostate Screning -  2024 - PSA 0.17   4 - Stage 3b Renal Insuficiency - Cr 1.7's, GFR 40 at baselien. Atrophic left kdieny and long h/o DM, HTN.   5 - Lower Urinary Tract symptoms - on tamsulsoin at baseline.   PMH sig for colon cancer / resection (temporary colostomy, reversal 2023), very large turncal obesity, some developmental delay (does not drive, lilves by himself, makes own decisions, brother Wandalee Gust very involved and lives close. His PCP is Ruven Coy MD.   Today "William Good" is seen to proceed with LEFT ureteral stent exchange. No interval fevers. A1c 9, Cr 1.3 most recently. Most recent UA without significant infectious parameters.   Past Medical History:  Diagnosis Date   Anginal pain (HCC)    Anxiety    Back pain    Bipolar disorder (HCC)    bipolar 1   CKD (chronic kidney disease) stage 2, GFR 60-89 ml/min 09/24/2020   COPD (chronic obstructive pulmonary disease) (HCC)    Coronary artery disease    Depression    GERD (gastroesophageal reflux disease)    High output ileostomy (HCC) 05/29/2021   History of kidney stones    Hypertension    Knee pain    Migraine     Palpitation    Rectal cancer (HCC)    Type 2 diabetes mellitus with complication, without long-term current use of insulin (HCC) 03/19/2021   no meds    Past Surgical History:  Procedure Laterality Date   BACK SURGERY     CYSTOSCOPY W/ URETERAL STENT REMOVAL Right 11/26/2022   Procedure: CYSTOSCOPY WITH STENT REMOVAL;  Surgeon: Melody Spurling., MD;  [facility];  Service: Urology;  Laterality: Right;   CYSTOSCOPY WITH RETROGRADE PYELOGRAM, URETEROSCOPY AND STENT PLACEMENT Bilateral 08/13/2022   Procedure: CYSTOSCOPY WITH BILATERAL RETROGRADE PYELOGRAMS, RIGHT  URETEROSCOPY AND BILATERAL STENT PLACEMENT;  Surgeon: Melody Spurling., MD;  [facility];  Service: Urology;  Laterality: Bilateral;  75 MINS   CYSTOSCOPY WITH STENT PLACEMENT Left 11/26/2022   Procedure: CYSTOSCOPY WITH STENT EXCHANGE;  Surgeon: Melody Spurling., MD;  [facility];  Service: Urology;  Laterality: Left;   HOLMIUM LASER APPLICATION Right 08/13/2022   Procedure: HOLMIUM LASER APPLICATION;  Surgeon: Melody Spurling., MD;  [facility];  Service: Urology;  Laterality: Right;   ILEO LOOP DIVERSION N/A 09/03/2021   Procedure: OPEN TAKEDOWN OF LOOP ILEOSTOMY;  Surgeon: Candyce Champagne, MD;  [facility];  Service: General;  Laterality: N/A;   JOINT REPLACEMENT Right    knee   OSTOMY N/A 05/27/2021   Procedure: POSSIBLE OSTOMY;  Surgeon: Candyce Champagne, MD;  [facility];  Service: General;  Laterality: N/A;   PORT-A-CATH REMOVAL Right 09/03/2021   Procedure: REMOVAL PORT-A-CATH;  Surgeon: Candyce Champagne, MD;  [facility];  Service: General;  Laterality: Right;   PORTACATH PLACEMENT N/A 10/02/2020   Procedure: PORT-A-CATH PLACEMENT;  Surgeon: Lujean Sake, MD;  [facility];  Service: General;  Laterality: N/A;   PROCTOSCOPY N/A 05/27/2021   Procedure: RIGID PROCTOSCOPY;  Surgeon: Candyce Champagne, MD;  [facility];  Service: General;  Laterality: N/A;   XI ROBOTIC ASSISTED  LOWER ANTERIOR RESECTION N/A 05/27/2021   Procedure: ROBOTIC LOW ANTERIOR COLON RESECTION, VERY LOW COLOANAL ANASTOMOSIS, DIVERTING ILEOSTOMY, BILATERAL TAP BLOCK,  FIRFLY INJECTION;  Surgeon: Candyce Champagne, MD;  [facility];  Service: General;  Laterality: N/A;    Family History  Problem Relation Age of Onset   ALS Mother    Heart disease Mother    Parkinson's disease Mother    Heart failure Father    Social History:  reports that he quit smoking about 2 years ago. His smoking use included cigarettes. He started smoking about 47 years ago. He has a 22.5 pack-year smoking history. He has never been exposed to tobacco smoke. He has never used smokeless tobacco. He reports that he does not drink alcohol and does not use drugs.  Allergies:  Allergies  Allergen Reactions   Nsaids Other (See Comments)    Chronic kidney disease - avoid NSAIDs     No medications prior to admission.    No results found for this or any previous visit (from the past 48 hours). No results found.  Review of Systems  Constitutional:  Negative for chills and fever.  All other systems reviewed and are negative.   There were no vitals taken for this visit. Physical Exam Vitals reviewed.  Constitutional:      Comments: Appears older than state age, stigmata of chronic disease.   HENT:     Head: Normocephalic.  Eyes:     Pupils: Pupils are equal, round, and reactive to light.  Cardiovascular:     Rate and Rhythm: Normal rate.  Pulmonary:     Effort: Pulmonary effort is normal.  Abdominal:     General: Abdomen is flat.  Genitourinary:    Comments: No CVAT at present Musculoskeletal:        General: Normal range of motion.     Cervical back: Normal range of motion.  Skin:    General: Skin is warm.  Neurological:     General: No focal deficit present.  Psychiatric:        Mood and Affect: Mood normal.      Assessment/Plan  Proceed as planned with LEFT ureteral stent exchange. Risks,  benefits, alternatives, expected peri-op course discussed previously and reiterated today. His baseline poor funcitonal capacity and metabolic comorbidity increaes risk of ALL peri-op complications including severe infection and mortality.   Melody Spurling., MD 06/22/2023, 6:47 AM

## 2023-06-22 NOTE — Discharge Instructions (Signed)
 1 - You may have urinary urgency (bladder spasms) and bloody urine on / off with stent in place. This is normal.  2 - Call MD or go to ER for fever >102, severe pain / nausea / vomiting not relieved by medications, or acute change in medical status

## 2023-06-22 NOTE — Anesthesia Preprocedure Evaluation (Addendum)
 Anesthesia Evaluation  Patient identified by MRN, date of birth, ID band Patient awake    Reviewed: Allergy & Precautions, H&P , NPO status , Patient's Chart, lab work & pertinent test results  Airway Mallampati: II  TM Distance: >3 FB Neck ROM: Full    Dental  (+) Edentulous Upper, Edentulous Lower   Pulmonary COPD, former smoker   Pulmonary exam normal breath sounds clear to auscultation       Cardiovascular hypertension, + CAD  Normal cardiovascular exam Rhythm:Regular Rate:Normal     Neuro/Psych  Headaches PSYCHIATRIC DISORDERS Anxiety Depression Bipolar Disorder      GI/Hepatic Neg liver ROS,GERD  ,,  Endo/Other  diabetes    Renal/GU CRFRenal disease  negative genitourinary   Musculoskeletal negative musculoskeletal ROS (+)    Abdominal  (+) + obese  Peds negative pediatric ROS (+)  Hematology  (+) Blood dyscrasia, anemia   Anesthesia Other Findings Hypertrophic cardiomyopathy-Apical variant. Severe hypertrophy of apex, small apical aneurysm. Mild LVH of basal segments with no lvot obstruction noted.   Reproductive/Obstetrics negative OB ROS                             Anesthesia Physical Anesthesia Plan  ASA: 3  Anesthesia Plan: General   Post-op Pain Management:    Induction: Intravenous  PONV Risk Score and Plan: Ondansetron, Midazolam and Treatment may vary due to age or medical condition  Airway Management Planned: LMA  Additional Equipment:   Intra-op Plan:   Post-operative Plan: Extubation in OR  Informed Consent: I have reviewed the patients History and Physical, chart, labs and discussed the procedure including the risks, benefits and alternatives for the proposed anesthesia with the patient or authorized representative who has indicated his/her understanding and acceptance.     Dental advisory given  Plan Discussed with: CRNA  Anesthesia Plan Comments:          Anesthesia Quick Evaluation

## 2023-06-22 NOTE — Anesthesia Procedure Notes (Signed)
 Procedure Name: LMA Insertion Date/Time: 06/22/2023 4:05 PM  Performed by: Jasyah Theurer, CRNAPre-anesthesia Checklist: Patient identified, Emergency Drugs available, Suction available and Patient being monitored Patient Re-evaluated:Patient Re-evaluated prior to induction Oxygen Delivery Method: Circle system utilized Preoxygenation: Pre-oxygenation with 100% oxygen Induction Type: IV induction Ventilation: Mask ventilation without difficulty LMA: LMA inserted LMA Size: 5.0 Tube type: Oral Number of attempts: 1 Airway Equipment and Method: Oral airway Placement Confirmation: positive ETCO2 and breath sounds checked- equal and bilateral Tube secured with: Tape Dental Injury: Teeth and Oropharynx as per pre-operative assessment

## 2023-06-23 ENCOUNTER — Encounter (HOSPITAL_COMMUNITY): Payer: Self-pay | Admitting: Urology

## 2023-06-23 NOTE — Anesthesia Postprocedure Evaluation (Signed)
 Anesthesia Post Note  Patient: William Good  Procedure(s) Performed: CYSTOSCOPY WITH LEFT STENT EXCHANGE (Left) CYSTOSCOPY, WITH RETROGRADE PYELOGRAM (Left)     Patient location during evaluation: PACU Anesthesia Type: General Level of consciousness: awake and alert Pain management: pain level controlled Vital Signs Assessment: post-procedure vital signs reviewed and stable Respiratory status: spontaneous breathing, nonlabored ventilation and respiratory function stable Cardiovascular status: blood pressure returned to baseline and stable Postop Assessment: no apparent nausea or vomiting Anesthetic complications: no   No notable events documented.  Last Vitals:  Vitals:   06/22/23 1715 06/22/23 1725  BP: (!) 145/92 (!) 144/92  Pulse: 66 65  Resp: 16   Temp: (!) 36 C 36.6 C  SpO2: 93% 92%    Last Pain:  Vitals:   06/22/23 1725  TempSrc: (P) Oral  PainSc: 0-No pain                 Earvin Goldberg

## 2023-06-23 NOTE — Op Note (Signed)
 NAME: William Good, VALVANO MEDICAL RECORD NO: [id number] ACCOUNT NO: [id number] DATE OF BIRTH: [birth year] FACILITY: [facility] LOCATION: [facility] PHYSICIAN: Osborn Blaze, MD  Operative Report   DATE OF PROCEDURE: 06/22/2023  PREOPERATIVE DIAGNOSIS:  Chronic left ureteral obstruction with major comorbidity.  POSTOPERATIVE DIAGNOSIS: Chronic left ureteral obstruction with major comorbidity.  PROCEDURE PERFORMED: 1.  Cystoscopy with right left retrograde pyelogram with interpretation. 2.  Exchange of left ureteral stent.  ESTIMATED BLOOD LOSS:  Nil.   COMPLICATIONS:  None.  SPECIMENS:  Left ureteral stent for discard.  FINDINGS: 1.  Significant improvement in left chronic hydronephrosis with stenting.  Successful exchange of left ureteral stent, proximal end in renal pelvis, distal end in urinary bladder. 2.  Minimal encrustation of left ureteral stent.  Longer interval, likely prudent.  INDICATIONS:  The patient is a 68 year old man with a history of significant metabolic and neurologic comorbidity.  He has a left partial ureteral obstruction managed with chronic stenting.  He is quite compliant with this, and this has done an excellent  job at reducing what he had previously been severe recurrent infections.  He presents for his stent exchange today as it has been approximately 6-7 months since the most recent one.  Informed consent was obtained and placed in medical record.  DESCRIPTION OF PROCEDURE:  The patient being identified and verified and the procedure being left ureteral stent exchange was confirmed.  Procedure timeout was performed.  Intravenous antibiotics administered.  General LMA anesthesia induced.  The  patient was placed into a low lithotomy position.  A sterile field was created, prepped and draped the patient's penis, perineum and proximal thighs using iodine. Cystourethroscopy was performed using a 21-French rigid cystoscope with offset lens.  The  patient's anterior  posterior urethra was unremarkable.  Inspection of urinary bladder distal end of the left ureteral stent in situ.  There was minimal encrustation as grasped by its entirety set aside for discard.  The left ureter was cannulated with a  6-French end-hole catheter, and left retrograde pyelogram was obtained.  Left retrograde pyelogram demonstrated a single left ureter.  Single system left kidney showed significant improvement in prior hydronephrosis.  The 0.035 ZIP wire was advanced to the level of the upper pole and over which a new 6 x 26 contour-type stent  was carefully placed using cystoscopic and fluoroscopic guidance and good proximal and distal planes were noted.  Bladder was empty per cystoscope.  The procedure was terminated.  The patient tolerated the procedure well.  No immediate periprocedural  complications.  The patient was taken to postanesthesia care unit in stable condition with plan for discharge home.   PUS D: 06/22/2023 4:28:56 pm T: 06/23/2023 6:57:00 am  JOB: [id number]/ [id number]

## 2023-07-01 DIAGNOSIS — K5909 Other constipation: Secondary | ICD-10-CM | POA: Diagnosis not present

## 2023-07-04 ENCOUNTER — Other Ambulatory Visit: Payer: Self-pay | Admitting: Family Medicine

## 2023-07-04 DIAGNOSIS — R140 Abdominal distension (gaseous): Secondary | ICD-10-CM

## 2023-07-04 DIAGNOSIS — K5900 Constipation, unspecified: Secondary | ICD-10-CM

## 2023-07-11 ENCOUNTER — Ambulatory Visit

## 2023-07-11 DIAGNOSIS — K5900 Constipation, unspecified: Secondary | ICD-10-CM

## 2023-07-11 DIAGNOSIS — R140 Abdominal distension (gaseous): Secondary | ICD-10-CM

## 2023-07-11 DIAGNOSIS — C20 Malignant neoplasm of rectum: Secondary | ICD-10-CM | POA: Diagnosis not present

## 2023-07-11 MED ORDER — IOHEXOL 300 MG/ML  SOLN
100.0000 mL | Freq: Once | INTRAMUSCULAR | Status: AC | PRN
  Administered 2023-07-11: 100 mL via INTRAVENOUS

## 2023-07-11 MED ORDER — IOHEXOL 9 MG/ML PO SOLN
500.0000 mL | ORAL | Status: AC
  Administered 2023-07-11 (×2): 500 mL via ORAL

## 2023-07-18 DIAGNOSIS — E1159 Type 2 diabetes mellitus with other circulatory complications: Secondary | ICD-10-CM | POA: Diagnosis not present

## 2023-07-26 DIAGNOSIS — E1159 Type 2 diabetes mellitus with other circulatory complications: Secondary | ICD-10-CM | POA: Diagnosis not present

## 2023-08-08 ENCOUNTER — Ambulatory Visit (HOSPITAL_COMMUNITY)
Admission: RE | Admit: 2023-08-08 | Discharge: 2023-08-08 | Disposition: A | Discharge status: Home / Self Care | Source: Ambulatory Visit | Attending: Internal Medicine | Admitting: Internal Medicine

## 2023-08-08 DIAGNOSIS — D509 Iron deficiency anemia, unspecified: Secondary | ICD-10-CM | POA: Diagnosis not present

## 2023-08-08 DIAGNOSIS — R5383 Other fatigue: Secondary | ICD-10-CM | POA: Diagnosis not present

## 2023-08-08 DIAGNOSIS — J449 Chronic obstructive pulmonary disease, unspecified: Secondary | ICD-10-CM | POA: Insufficient documentation

## 2023-08-08 DIAGNOSIS — I422 Other hypertrophic cardiomyopathy: Secondary | ICD-10-CM | POA: Diagnosis not present

## 2023-08-08 LAB — PULMONARY FUNCTION TEST
DL/VA % pred: 81 %
DL/VA: 3.31 ml/min/mmHg/L
DLCO unc % pred: 77 %
DLCO unc: 21.62 ml/min/mmHg
FEF 25-75 Post: 1.17 L/s
FEF 25-75 Pre: 1.19 L/s
FEF2575-%Change-Post: -1 %
FEF2575-%Pred-Post: 42 %
FEF2575-%Pred-Pre: 42 %
FEV1-%Change-Post: -3 %
FEV1-%Pred-Post: 49 %
FEV1-%Pred-Pre: 51 %
FEV1-Post: 1.78 L
FEV1-Pre: 1.85 L
FEV1FVC-%Change-Post: -4 %
FEV1FVC-%Pred-Pre: 85 %
FEV6-%Change-Post: -1 %
FEV6-%Pred-Post: 62 %
FEV6-%Pred-Pre: 63 %
FEV6-Post: 2.86 L
FEV6-Pre: 2.9 L
FEV6FVC-%Change-Post: -2 %
FEV6FVC-%Pred-Post: 102 %
FEV6FVC-%Pred-Pre: 104 %
FVC-%Change-Post: 0 %
FVC-%Pred-Post: 60 %
FVC-%Pred-Pre: 60 %
FVC-Post: 2.94 L
FVC-Pre: 2.92 L
Post FEV1/FVC ratio: 61 %
Post FEV6/FVC ratio: 97 %
Pre FEV1/FVC ratio: 63 %
Pre FEV6/FVC Ratio: 99 %
RV % pred: 127 %
RV: 3.21 L
TLC % pred: 91 %
TLC: 6.78 L

## 2023-08-08 MED ORDER — ALBUTEROL SULFATE (2.5 MG/3ML) 0.083% IN NEBU
2.5000 mg | INHALATION_SOLUTION | Freq: Once | RESPIRATORY_TRACT | Status: AC
  Administered 2023-08-08: 2.5 mg via RESPIRATORY_TRACT

## 2023-08-11 DIAGNOSIS — N270 Small kidney, unilateral: Secondary | ICD-10-CM | POA: Diagnosis not present

## 2023-08-11 DIAGNOSIS — N130 Hydronephrosis with ureteropelvic junction obstruction: Secondary | ICD-10-CM | POA: Diagnosis not present

## 2023-08-26 DIAGNOSIS — C20 Malignant neoplasm of rectum: Secondary | ICD-10-CM | POA: Diagnosis not present

## 2023-08-26 DIAGNOSIS — E785 Hyperlipidemia, unspecified: Secondary | ICD-10-CM | POA: Diagnosis not present

## 2023-08-26 DIAGNOSIS — N183 Chronic kidney disease, stage 3 unspecified: Secondary | ICD-10-CM | POA: Diagnosis not present

## 2023-08-26 DIAGNOSIS — J449 Chronic obstructive pulmonary disease, unspecified: Secondary | ICD-10-CM | POA: Diagnosis not present

## 2023-08-26 DIAGNOSIS — D126 Benign neoplasm of colon, unspecified: Secondary | ICD-10-CM | POA: Diagnosis not present

## 2023-08-26 DIAGNOSIS — I10 Essential (primary) hypertension: Secondary | ICD-10-CM | POA: Diagnosis not present

## 2023-08-26 DIAGNOSIS — E1159 Type 2 diabetes mellitus with other circulatory complications: Secondary | ICD-10-CM | POA: Diagnosis not present

## 2023-08-26 DIAGNOSIS — D509 Iron deficiency anemia, unspecified: Secondary | ICD-10-CM | POA: Diagnosis not present

## 2023-09-15 DIAGNOSIS — K5900 Constipation, unspecified: Secondary | ICD-10-CM | POA: Diagnosis not present

## 2023-09-15 DIAGNOSIS — R198 Other specified symptoms and signs involving the digestive system and abdomen: Secondary | ICD-10-CM | POA: Diagnosis not present

## 2023-09-15 DIAGNOSIS — Z85048 Personal history of other malignant neoplasm of rectum, rectosigmoid junction, and anus: Secondary | ICD-10-CM | POA: Diagnosis not present

## 2023-10-24 ENCOUNTER — Other Ambulatory Visit: Payer: Medicare Other

## 2023-10-24 ENCOUNTER — Inpatient Hospital Stay: Admitting: Nurse Practitioner

## 2023-10-24 ENCOUNTER — Telehealth: Payer: Self-pay

## 2023-10-24 ENCOUNTER — Encounter: Payer: Self-pay | Admitting: Nurse Practitioner

## 2023-10-24 ENCOUNTER — Inpatient Hospital Stay: Attending: Nurse Practitioner

## 2023-10-24 ENCOUNTER — Ambulatory Visit: Payer: Medicare Other | Admitting: Oncology

## 2023-10-24 VITALS — BP 140/100 | HR 91 | Temp 97.8°F | Resp 18 | Ht 72.0 in | Wt 260.0 lb

## 2023-10-24 DIAGNOSIS — Z9221 Personal history of antineoplastic chemotherapy: Secondary | ICD-10-CM | POA: Insufficient documentation

## 2023-10-24 DIAGNOSIS — E1122 Type 2 diabetes mellitus with diabetic chronic kidney disease: Secondary | ICD-10-CM | POA: Insufficient documentation

## 2023-10-24 DIAGNOSIS — Z923 Personal history of irradiation: Secondary | ICD-10-CM | POA: Insufficient documentation

## 2023-10-24 DIAGNOSIS — I129 Hypertensive chronic kidney disease with stage 1 through stage 4 chronic kidney disease, or unspecified chronic kidney disease: Secondary | ICD-10-CM | POA: Diagnosis not present

## 2023-10-24 DIAGNOSIS — R1900 Intra-abdominal and pelvic swelling, mass and lump, unspecified site: Secondary | ICD-10-CM | POA: Insufficient documentation

## 2023-10-24 DIAGNOSIS — N2889 Other specified disorders of kidney and ureter: Secondary | ICD-10-CM | POA: Diagnosis not present

## 2023-10-24 DIAGNOSIS — C20 Malignant neoplasm of rectum: Secondary | ICD-10-CM | POA: Insufficient documentation

## 2023-10-24 DIAGNOSIS — R197 Diarrhea, unspecified: Secondary | ICD-10-CM | POA: Insufficient documentation

## 2023-10-24 DIAGNOSIS — G620 Drug-induced polyneuropathy: Secondary | ICD-10-CM | POA: Diagnosis not present

## 2023-10-24 DIAGNOSIS — N189 Chronic kidney disease, unspecified: Secondary | ICD-10-CM | POA: Insufficient documentation

## 2023-10-24 LAB — CBC WITH DIFFERENTIAL (CANCER CENTER ONLY)
Abs Immature Granulocytes: 0.03 K/uL (ref 0.00–0.07)
Basophils Absolute: 0 K/uL (ref 0.0–0.1)
Basophils Relative: 0 %
Eosinophils Absolute: 0.1 K/uL (ref 0.0–0.5)
Eosinophils Relative: 1 %
HCT: 49.3 % (ref 39.0–52.0)
Hemoglobin: 16.5 g/dL (ref 13.0–17.0)
Immature Granulocytes: 0 %
Lymphocytes Relative: 16 %
Lymphs Abs: 1.4 K/uL (ref 0.7–4.0)
MCH: 31.5 pg (ref 26.0–34.0)
MCHC: 33.5 g/dL (ref 30.0–36.0)
MCV: 94.1 fL (ref 80.0–100.0)
Monocytes Absolute: 0.5 K/uL (ref 0.1–1.0)
Monocytes Relative: 6 %
Neutro Abs: 6.7 K/uL (ref 1.7–7.7)
Neutrophils Relative %: 77 %
Platelet Count: 174 K/uL (ref 150–400)
RBC: 5.24 MIL/uL (ref 4.22–5.81)
RDW: 12.6 % (ref 11.5–15.5)
WBC Count: 8.7 K/uL (ref 4.0–10.5)
nRBC: 0 % (ref 0.0–0.2)

## 2023-10-24 LAB — CEA (ACCESS): CEA (CHCC): 1.95 ng/mL (ref 0.00–5.00)

## 2023-10-24 LAB — FERRITIN: Ferritin: 100 ng/mL (ref 24–336)

## 2023-10-24 NOTE — Telephone Encounter (Signed)
 Patient gave verbal understanding and no further questions or concerns

## 2023-10-24 NOTE — Telephone Encounter (Signed)
-----   Message from Olam Ned sent at 10/24/2023  3:05 PM EDT ----- Please let him know CEA is stable in normal range.  Follow-up as scheduled.

## 2023-10-24 NOTE — Progress Notes (Signed)
 [facility] OFFICE PROGRESS NOTE   Diagnosis: Rectal cancer  INTERVAL HISTORY:   William Good returns as scheduled.  He continues to have irregular bowel habits.  No bleeding.  He reports periodic migraine headaches.  Objective:  Vital signs in last 24 hours:  Blood pressure (!) 140/100, pulse 91, temperature 97.8 F (36.6 C), temperature source Temporal, resp. rate 18, height 6' (1.829 m), weight 260 lb (117.9 kg), SpO2 98%.    Lymphatics: No palpable cervical, supraclavicular, axillary or inguinal lymph nodes. Resp: Distant breath sounds.  No respiratory distress. Cardio: Distant heart sounds, regular rhythm. GI: Masslike fullness throughout the lateral left abdomen.  No hepatomegaly. Vascular: No leg edema.   Lab Results:  Lab Results  Component Value Date   WBC 8.7 10/24/2023   HGB 16.5 10/24/2023   HCT 49.3 10/24/2023   MCV 94.1 10/24/2023   PLT 174 10/24/2023   NEUTROABS 6.7 10/24/2023    Imaging:  No results found.  Medications: I have reviewed the patient's current medications.  Assessment/Plan: Rectal cancer Mass at 6 cm on digital exam/anoscopy 08/25/2020 Colonoscopy 09/05/2020-mid rectal mass-biopsy, adenocarcinoma, no loss of mismatch repair protein expression CT abdomen/pelvis 08/29/2020-small amount of free fluid in the pelvis, mildly enlarged left external iliac node, cystic changes of the left kidney with hydronephrosis and regions of cortical thinning with transition at the left UPJ suggesting chronic left UPJ obstruction MRI pelvis 09/24/2020-tumor 6 cm from the anal verge, 1.8 cm from the internal anal sphincter, T2, 7 mm lymph node in the left mesorectal sheath, N1 Cycle 1 FOLFOX 10/08/2020 Cycle 2 FOLFOX 10/22/2020 Cycle 3 FOLFOX 11/12/2020 (oxaliplatin  dose reduced, white cell growth factor support on day of pump discontinuation) Cycle 4 FOLFOX 11/26/2020, 5-FU bolus and infusion dose reduced secondary to diarrhea, Ziextenzo  Cycle 5  FOLFOX 12/10/2020, 5-FU bolus held due to diarrhea, Ziextenzo  Cycle 6 FOLFOX 12/24/2020, 5-FU bolus held, Ziextenzo  Cycle 7 FOLFOX 01/07/2021, oxaliplatin  held due to neuropathy, 5-FU bolus held, Ziextenzo  held Cycle 8 FOLFOX 01/21/2021, oxaliplatin  held due to neuropathy, 5-FU bolus held, Ziextenzo  held Radiation/Xeloda  02/02/2021-03/16/2021 Low anterior resection/diverting loop ileostomy 05/27/2021, no residual carcinoma, 0/19 nodes, negative resection margins, ypT0,ypN0 Ileostomy takedown 09/03/2021 CTs 05/31/2022-surgical changes noted of distal colonic resection and primary anastomosis.  Maturing areas of surgical change involving small bowel in the right side of the anterior abdominal/pelvic wall.  Previous abnormal nodes in the left pelvic sidewall are improved.  Nodes in the upper retroperitoneum appear slightly smaller.  Massively enlarged left kidney.  Persistent large right-sided parapelvic renal cyst with nonobstructing renal stones. Colonoscopy 07/16/2022-polyps removed from the ascending and transverse colon-tubular adenomas     Diabetes Chronic kidney disease Bipolar disorder Hypertension Hyperlipidemia Restless leg syndrome Multiple colon polyps removed on the colonoscopy 09/05/2020-tubular adenomas including a rectal polyp with high-grade dysplasia-polypectomy margin negative for dysplasia Portacath placement, Dr Dasie, 10/02/2020 Oxaliplatin  neuropathy Diarrhea following ileostomy takedown procedure.  Improved with Lomotil. Palpable left abdominal mass on exam 05/28/2022-massively enlarged left kidney; referred to urology Cystoscopy with retrograde pyelograms, placement of bilateral ureter stents, laser lithotripsy for treatment of a right UPJ stone, massively hydronephrotic left kidney      Disposition: William Good appears stable.  He remains in clinical remission from rectal cancer.  We will follow-up on the CEA from today.  He is up-to-date on colonoscopy surveillance.  He will  return for a CEA and follow-up visit in 6 months.  We are available to see him sooner if needed.    William Good  ANP/GNP-BC   10/24/2023  1:46 PM

## 2023-10-27 ENCOUNTER — Telehealth: Payer: Self-pay | Admitting: Nurse Practitioner

## 2023-10-27 NOTE — Telephone Encounter (Signed)
 Patient has been scheduled for follow-up visit per 10/24/23 LOS.  Pt aware of scheduled appt details.

## 2023-11-04 DIAGNOSIS — K219 Gastro-esophageal reflux disease without esophagitis: Secondary | ICD-10-CM | POA: Diagnosis not present

## 2023-11-04 DIAGNOSIS — E1159 Type 2 diabetes mellitus with other circulatory complications: Secondary | ICD-10-CM | POA: Diagnosis not present

## 2023-11-04 DIAGNOSIS — N183 Chronic kidney disease, stage 3 unspecified: Secondary | ICD-10-CM | POA: Diagnosis not present

## 2023-11-04 DIAGNOSIS — G43009 Migraine without aura, not intractable, without status migrainosus: Secondary | ICD-10-CM | POA: Diagnosis not present

## 2023-11-04 DIAGNOSIS — I10 Essential (primary) hypertension: Secondary | ICD-10-CM | POA: Diagnosis not present

## 2023-11-04 DIAGNOSIS — E785 Hyperlipidemia, unspecified: Secondary | ICD-10-CM | POA: Diagnosis not present

## 2023-11-04 DIAGNOSIS — Z23 Encounter for immunization: Secondary | ICD-10-CM | POA: Diagnosis not present

## 2023-11-04 DIAGNOSIS — D509 Iron deficiency anemia, unspecified: Secondary | ICD-10-CM | POA: Diagnosis not present

## 2023-12-19 DIAGNOSIS — R3915 Urgency of urination: Secondary | ICD-10-CM | POA: Diagnosis not present

## 2023-12-19 DIAGNOSIS — N130 Hydronephrosis with ureteropelvic junction obstruction: Secondary | ICD-10-CM | POA: Diagnosis not present

## 2023-12-29 DIAGNOSIS — B351 Tinea unguium: Secondary | ICD-10-CM | POA: Diagnosis not present

## 2023-12-29 DIAGNOSIS — M79675 Pain in left toe(s): Secondary | ICD-10-CM | POA: Diagnosis not present

## 2023-12-29 DIAGNOSIS — M79674 Pain in right toe(s): Secondary | ICD-10-CM | POA: Diagnosis not present

## 2023-12-29 DIAGNOSIS — L84 Corns and callosities: Secondary | ICD-10-CM | POA: Diagnosis not present

## 2024-03-06 ENCOUNTER — Emergency Department (HOSPITAL_BASED_OUTPATIENT_CLINIC_OR_DEPARTMENT_OTHER): Admission: EM | Admit: 2024-03-06 | Discharge: 2024-03-06 | Disposition: A | Discharge status: Home / Self Care

## 2024-03-06 ENCOUNTER — Other Ambulatory Visit: Payer: Self-pay

## 2024-03-06 DIAGNOSIS — R319 Hematuria, unspecified: Secondary | ICD-10-CM | POA: Diagnosis present

## 2024-03-06 DIAGNOSIS — Z7982 Long term (current) use of aspirin: Secondary | ICD-10-CM | POA: Insufficient documentation

## 2024-03-06 DIAGNOSIS — N390 Urinary tract infection, site not specified: Secondary | ICD-10-CM | POA: Diagnosis not present

## 2024-03-06 DIAGNOSIS — K5900 Constipation, unspecified: Secondary | ICD-10-CM | POA: Insufficient documentation

## 2024-03-06 LAB — COMPREHENSIVE METABOLIC PANEL WITH GFR
ALT: 16 U/L (ref 0–44)
AST: 26 U/L (ref 15–41)
Albumin: 4.2 g/dL (ref 3.5–5.0)
Alkaline Phosphatase: 127 U/L — ABNORMAL HIGH (ref 38–126)
Anion gap: 10 (ref 5–15)
BUN: 10 mg/dL (ref 8–23)
CO2: 29 mmol/L (ref 22–32)
Calcium: 9.3 mg/dL (ref 8.9–10.3)
Chloride: 100 mmol/L (ref 98–111)
Creatinine, Ser: 1.57 mg/dL — ABNORMAL HIGH (ref 0.61–1.24)
GFR, Estimated: 47 mL/min — ABNORMAL LOW
Glucose, Bld: 125 mg/dL — ABNORMAL HIGH (ref 70–99)
Potassium: 4.5 mmol/L (ref 3.5–5.1)
Sodium: 139 mmol/L (ref 135–145)
Total Bilirubin: 0.6 mg/dL (ref 0.0–1.2)
Total Protein: 7.5 g/dL (ref 6.5–8.1)

## 2024-03-06 LAB — URINALYSIS, ROUTINE W REFLEX MICROSCOPIC
Bilirubin Urine: NEGATIVE
Glucose, UA: NEGATIVE mg/dL
Ketones, ur: NEGATIVE mg/dL
Nitrite: NEGATIVE
Protein, ur: 100 mg/dL — AB
Specific Gravity, Urine: 1.025 (ref 1.005–1.030)
pH: 6 (ref 5.0–8.0)

## 2024-03-06 LAB — CBC
HCT: 49.6 % (ref 39.0–52.0)
Hemoglobin: 16.9 g/dL (ref 13.0–17.0)
MCH: 31.6 pg (ref 26.0–34.0)
MCHC: 34.1 g/dL (ref 30.0–36.0)
MCV: 92.9 fL (ref 80.0–100.0)
Platelets: 200 K/uL (ref 150–400)
RBC: 5.34 MIL/uL (ref 4.22–5.81)
RDW: 12.8 % (ref 11.5–15.5)
WBC: 8.4 K/uL (ref 4.0–10.5)
nRBC: 0 % (ref 0.0–0.2)

## 2024-03-06 LAB — URINALYSIS, MICROSCOPIC (REFLEX)
RBC / HPF: >50 RBC/hpf (ref 0–5)
WBC, UA: >50 WBC/hpf (ref 0–5)

## 2024-03-06 LAB — LIPASE, BLOOD: Lipase: 74 U/L — ABNORMAL HIGH (ref 11–51)

## 2024-03-06 MED ORDER — CEPHALEXIN 500 MG PO CAPS: 500.0000 mg | ORAL_CAPSULE | Freq: Four times a day (QID) | ORAL | 0 refills | Status: AC

## 2024-03-06 MED ORDER — CEPHALEXIN 250 MG PO CAPS
500.0000 mg | ORAL_CAPSULE | Freq: Once | ORAL | Status: AC
  Administered 2024-03-06: 500 mg via ORAL
  Filled 2024-03-06: qty 2

## 2024-03-06 NOTE — Discharge Instructions (Signed)
 Take your antibiotics as prescribed.  Follow-up with your primary care doctor.  Return to the ER for new or worsening symptoms.

## 2024-03-06 NOTE — ED Provider Notes (Signed)
 " [place] EMERGENCY DEPARTMENT AT [facility] Provider Note   CSN: [id number] Arrival date & time: 03/06/24  1418     Patient presents with: Constipation   William Good is a 68 y.o. male.   68 year old male presents for evaluation of hematuria.  Patient states he gets constipated quite frequently as he has a history of rectal cancer.  States last night he developed some diarrhea that has gone away but he also noticed some blood in his urine.  He is concerned he may have a urinary tract infection.  Does admit to some dysuria as well.  States he feels overall weak from having diarrhea but denies any nausea or vomiting or any other symptoms or concerns.   Constipation Associated symptoms: dysuria   Associated symptoms: no abdominal pain, no back pain, no fever and no vomiting        Prior to Admission medications  Medication Sig Start Date End Date Taking? Authorizing Provider  cephALEXin  (KEFLEX ) 500 MG capsule Take 1 capsule (500 mg total) by mouth 4 (four) times daily for 7 days. 03/06/24 03/13/24 Yes Opaline Reyburn L, DO  acetaminophen  (TYLENOL ) 325 MG tablet Take 650 mg by mouth every 6 (six) hours as needed for mild pain.    [provider]  ALPRAZolam  (XANAX ) 1 MG tablet Take 1 tablet (1 mg total) by mouth 2 (two) times daily. Every morning as needed for anxiety, nerves, and behavior 08/06/21   Landy Barnie RAMAN, NP  amLODipine (NORVASC) 10 MG tablet Take 10 mg by mouth daily.    [provider]  aspirin  EC 81 MG tablet Take 81 mg by mouth daily. Swallow whole. Patient not taking: Reported on 10/24/2023    [provider]  Cholecalciferol  (VITAMIN D3) 50 MCG (2000 UT) capsule Take 2,000 Units by mouth daily. Patient not taking: Reported on 10/24/2023 03/08/20   [provider]  diphenoxylate-atropine (LOMOTIL) 2.5-0.025 MG tablet Take 1 tablet by mouth 4 (four) times daily as needed for diarrhea or loose stools. 10/26/22   [provider]  glipiZIDE (GLUCOTROL XL) 10 MG 24 hr tablet Take 10 mg by mouth daily with breakfast. 09/27/22   [provider]  HYDROcodone -acetaminophen  (NORCO/VICODIN) 5-325 MG tablet Take 1 tablet by mouth every 6 (six) hours as needed for severe pain (pain score 7-10). Patient not taking: Reported on 10/24/2023 06/22/23   [name], MD  METAMUCIL FIBER PO Take 0.5 Scoops by mouth daily as needed (regularity).    [provider]  mirtazapine  (REMERON ) 45 MG tablet Take 1 tablet (45 mg total) by mouth at bedtime. 08/06/21   Landy Barnie RAMAN, NP  omeprazole  (PRILOSEC) 20 MG capsule Take 1 capsule (20 mg total) 2 (two) times daily by mouth. 01/17/17   Lynwood Anes, MD  ondansetron  (ZOFRAN ) 8 MG tablet Take 8 mg by mouth every 8 (eight) hours as needed for nausea or vomiting. Patient not taking: Reported on 10/24/2023    [provider]  pravastatin  (PRAVACHOL ) 40 MG tablet Take 1 tablet (40 mg total) by mouth daily. 08/06/21   Landy Barnie RAMAN, NP  propranolol  ER (INDERAL  LA) 160 MG SR capsule Take 1 capsule (160 mg total) by mouth daily. 08/09/22   Lavona Lynwood, MD  SUMAtriptan  (IMITREX ) 100 MG tablet Take 1 tablet (100 mg total) by mouth 2 (two) times daily as needed for migraine. 08/06/21   Landy Barnie RAMAN, NP  tamsulosin  (FLOMAX ) 0.4 MG CAPS capsule Take 1 capsule (0.4  mg total) by mouth at bedtime. 08/06/21   Landy Barnie RAMAN, NP  traZODone (DESYREL) 50 MG tablet Take 50 mg by mouth at bedtime. 07/20/22   [provider]    Allergies: Nsaids    Review of Systems  Constitutional:  Positive for fatigue. Negative for chills and fever.  HENT:  Negative for ear pain and sore throat.   Eyes:  Negative for pain and visual disturbance.  Respiratory:  Negative for cough and shortness of breath.   Cardiovascular:  Negative for chest pain and palpitations.  Gastrointestinal:  Positive for constipation. Negative for abdominal pain and vomiting.  Genitourinary:   Positive for dysuria and hematuria.  Musculoskeletal:  Negative for arthralgias and back pain.  Skin:  Negative for color change and rash.  Neurological:  Positive for weakness. Negative for seizures and syncope.  All other systems reviewed and are negative.   Updated Vital Signs BP (!) 134/102 (BP Location: Right Arm)   Pulse 79   Temp 97.8 F (36.6 C) (Oral)   Resp 18   SpO2 93%   Physical Exam Vitals and nursing note reviewed.  Constitutional:      General: He is not in acute distress.    Appearance: Normal appearance. He is well-developed. He is ill-appearing.     Comments: Chronically ill-appearing  HENT:     Head: Normocephalic and atraumatic.  Eyes:     Conjunctiva/sclera: Conjunctivae normal.  Cardiovascular:     Rate and Rhythm: Normal rate and regular rhythm.     Heart sounds: No murmur heard. Pulmonary:     Effort: Pulmonary effort is normal. No respiratory distress.     Breath sounds: Normal breath sounds.  Abdominal:     General: There is distension.     Palpations: Abdomen is soft.     Tenderness: There is no abdominal tenderness.  Musculoskeletal:        General: No swelling.     Cervical back: Neck supple.  Skin:    General: Skin is warm and dry.     Capillary Refill: Capillary refill takes less than 2 seconds.  Neurological:     Mental Status: He is alert.  Psychiatric:        Mood and Affect: Mood normal.     (all labs ordered are listed, but only abnormal results are displayed) Labs Reviewed  LIPASE, BLOOD - Abnormal; Notable for the following components:      Result Value   Lipase 74 (*)    All other components within normal limits  COMPREHENSIVE METABOLIC PANEL WITH GFR - Abnormal; Notable for the following components:   Glucose, Bld 125 (*)    Creatinine, Ser 1.57 (*)    Alkaline Phosphatase 127 (*)    GFR, Estimated 47 (*)    All other components within normal limits  URINALYSIS, ROUTINE W REFLEX MICROSCOPIC - Abnormal; Notable for  the following components:   APPearance HAZY (*)    Hgb urine dipstick LARGE (*)    Protein, ur 100 (*)    Leukocytes,Ua MODERATE (*)    All other components within normal limits  URINALYSIS, MICROSCOPIC (REFLEX) - Abnormal; Notable for the following components:   Bacteria, UA RARE (*)    All other components within normal limits  CBC    EKG: None  Radiology: No results found.   Procedures   Medications Ordered in the ED  cephALEXin  (KEFLEX ) capsule 500 mg (has no administration in time range)  Medical Decision Making Patient with evidence of UTI.  Will give him Keflex  here and give him a prescription for this.  Declined CT scan or IV fluids.  Labs otherwise fairly unremarkable.  He would like to go home and tried to increase his fluid intake.  Will send a prescription for Keflex  advised.  With his specialist and primary care doctor and otherwise return for new or worsening symptoms.  He feels comfortable with the plan for discharge.  Problems Addressed: Constipation, unspecified constipation type: chronic illness or injury Urinary tract infection with hematuria, site unspecified: acute illness or injury  Amount and/or Complexity of Data Reviewed External Data Reviewed: notes.    Details: Outpatient records reviewed and patient was sent here by her primary care doctor's office for blood work and UA Labs: ordered. Decision-making details documented in ED Course.    Details: Ordered and reviewed by me patient's labs are fairly unremarkable except for UTI  Risk OTC drugs. Prescription drug management.     Final diagnoses:  Urinary tract infection with hematuria, site unspecified  Constipation, unspecified constipation type    ED Discharge Orders          Ordered    cephALEXin  (KEFLEX ) 500 MG capsule  4 times daily        03/06/24 1903               Gennaro Duwaine CROME, DO 03/06/24 1909  "

## 2024-03-06 NOTE — ED Triage Notes (Signed)
 Patient states constipation for several months while undergoing treatment for rectal cancer. This morning noticed blood in urine with decreased urine output. Saw PCP who sent him here for further workup and blood work.

## 2024-03-14 ENCOUNTER — Other Ambulatory Visit: Payer: Self-pay | Admitting: Gastroenterology

## 2024-03-14 DIAGNOSIS — Z85048 Personal history of other malignant neoplasm of rectum, rectosigmoid junction, and anus: Secondary | ICD-10-CM

## 2024-03-16 ENCOUNTER — Encounter: Payer: Self-pay | Admitting: Radiology

## 2024-03-21 ENCOUNTER — Inpatient Hospital Stay: Admission: RE | Admit: 2024-03-21 | Discharge: 2024-03-21 | Attending: Gastroenterology

## 2024-03-21 DIAGNOSIS — Z85048 Personal history of other malignant neoplasm of rectum, rectosigmoid junction, and anus: Secondary | ICD-10-CM

## 2024-03-21 MED ORDER — IOPAMIDOL (ISOVUE-300) INJECTION 61%
100.0000 mL | Freq: Once | INTRAVENOUS | Status: AC | PRN
  Administered 2024-03-21: 100 mL via INTRAVENOUS

## 2024-04-06 ENCOUNTER — Other Ambulatory Visit: Payer: Self-pay | Admitting: Urology

## 2024-04-06 NOTE — Progress Notes (Signed)
 Date of COVID positive in last 90 days:  PCP - Lamar Ng, MD Cardiologist - Lynwood Schilling, MD (last OV 2024)  Chest x-ray - N/A EKG - 04-10-24 Epic Stress Test - N/A ECHO - 04-01-21 Epic Cardiac Cath - N/A MR Cardiac - 01-20-22 Epic Long Term Monitor - 2023 Epic Pacemaker/ICD device last checked:N/A Spinal Cord Stimulator:N/A  Bowel Prep - N/A  Sleep Study - N/A CPAP -   Fasting Blood Sugar - 100 range Checks Blood Sugar - 2 times a day  Ozempic  Last dose of GLP1 agonist-  04-03-24 GLP1 instructions:  Hold until after surgery   Last dose of SGLT-2 inhibitors-  N/A SGLT-2 instructions:  Do not take after    Blood Thinner Instructions: N/A Last dose:   Time: Aspirin  Instructions:N/A Last Dose:  Activity level:  Can go up a flight of stairs and perform activities of daily living without stopping and without symptoms of chest pain or shortness of breath.  Anesthesia review: CAD, HTN, DM, CKD  Creatinine 1.57 on preop labs  Patient denies shortness of breath, fever, cough and chest pain at PAT appointment  Patient verbalized understanding of instructions that were given to them at the PAT appointment. Patient was also instructed that they will need to review over the PAT instructions again at home before surgery.

## 2024-04-06 NOTE — Progress Notes (Signed)
 Sent message, via epic in basket, requesting orders in epic from Careers adviser.

## 2024-04-06 NOTE — Patient Instructions (Addendum)
 SURGICAL WAITING ROOM VISITATION Patients having surgery or a procedure may have no more than 2 support people in the waiting area - these visitors may rotate.    Children under the age of 70 will not be allowed to visit due to the increase in respiratory illness  Children under the age of 79 must have an adult with them who is not the patient.  If the patient needs to stay at the hospital during part of their recovery, the visitor guidelines for inpatient rooms apply. Pre-op nurse will coordinate an appropriate time for 1 support person to accompany patient in pre-op.  This support person may not rotate.    Please refer to the [facility] website for the visitor guidelines for Inpatients (after your surgery is over and you are in a regular room).       Your procedure is scheduled on: 04-20-24   Report to [facility] Main Entrance    Report to admitting at 8:45 AM   Call this number if you have problems the morning of surgery [phone]   Do not eat food or drink liquids :After Midnight.          If you have questions, please contact your surgeons office.   FOLLOW  ANY ADDITIONAL PRE OP INSTRUCTIONS YOU RECEIVED FROM YOUR SURGEON'S OFFICE!!!     Oral Hygiene is also important to reduce your risk of infection.                                    Remember - BRUSH YOUR TEETH THE MORNING OF SURGERY WITH YOUR REGULAR TOOTHPASTE   Do NOT smoke after Midnight   Take these medicines the morning of surgery with A SIP OF WATER :    Amlodipine (Norvasc)   Omeprazole  (Prilosec)   Pravastatin  (Pravachol )   Propranolol  (Inderal )   If needed Sumatriptan  (Imitrex ), Alprazolam  (Xanax ), Tylenol     Stop all vitamins and herbal supplements 7 days before surgery  How to Manage Your Diabetes Before and After Surgery  Why is it important to control my blood sugar before and after surgery? Improving blood sugar levels before and after surgery helps healing and can limit problems. A  way of improving blood sugar control is eating a healthy diet by:  Eating less sugar and carbohydrates  Increasing activity/exercise  Talking with your doctor about reaching your blood sugar goals High blood sugars (greater than 180 mg/dL) can raise your risk of infections and slow your recovery, so you will need to focus on controlling your diabetes during the weeks before surgery. Make sure that the doctor who takes care of your diabetes knows about your planned surgery including the date and location.  How do I manage my blood sugar before surgery? Check your blood sugar at least 4 times a day, starting 2 days before surgery, to make sure that the level is not too high or low. Check your blood sugar the morning of your surgery when you wake up and every 2 hours until you get to the Short Stay unit. If your blood sugar is less than 70 mg/dL, you will need to treat for low blood sugar: Do not take insulin . Treat a low blood sugar (less than 70 mg/dL) with  cup of clear juice (cranberry or apple), 4 glucose tablets, OR glucose gel. Recheck blood sugar in 15 minutes after treatment (to make sure it is greater than 70 mg/dL).  If your blood sugar is not greater than 70 mg/dL on recheck, call [phone] for further instructions. Report your blood sugar to the short stay nurse when you get to Short Stay.  If you are admitted to the hospital after surgery: Your blood sugar will be checked by the staff and you will probably be given insulin  after surgery (instead of oral diabetes medicines) to make sure you have good blood sugar levels. The goal for blood sugar control after surgery is 80-180 mg/dL.   WHAT DO I DO ABOUT MY DIABETES MEDICATION?  Do not take oral diabetes medicines (pills) the morning of surgery. (Do not take Glipizide day of surgery)  DO NOT TAKE THE FOLLOWING 7 DAYS PRIOR TO SURGERY: Ozempic, Wegovy, Rybelsus (Semaglutide), Byetta (exenatide), Bydureon (exenatide ER), Victoza,  Saxenda (liraglutide), or Trulicity (dulaglutide) Mounjaro (Tirzepatide) Adlyxin (Lixisenatide), Polyethylene Glycol Loxenatide.  Reviewed and Endorsed by [facility] Patient Education Committee, August 2015  Bring CPAP mask and tubing day of surgery.                              You may not have any metal on your body including  jewelry, and body piercing             Do not wear  lotions, powders, cologne, or deodorant              Men may shave face and neck.   Do not bring valuables to the hospital. [facility] IS NOT RESPONSIBLE   FOR VALUABLES.   Contacts, dentures or bridgework may not be worn into surgery.  DO NOT BRING YOUR HOME MEDICATIONS TO THE HOSPITAL. PHARMACY WILL DISPENSE MEDICATIONS LISTED ON YOUR MEDICATION LIST TO YOU DURING YOUR ADMISSION IN THE HOSPITAL!    Patients discharged on the day of surgery will not be allowed to drive home.  Someone NEEDS to stay with you for the first 24 hours after anesthesia.   Special Instructions: Bring a copy of your healthcare power of attorney and living will documents the day of surgery if you haven't scanned them before.              Please read over the following fact sheets you were given: IF YOU HAVE QUESTIONS ABOUT YOUR PRE-OP INSTRUCTIONS PLEASE CALL [phone] [place] [facility] - Preparing for Surgery Before surgery, you can play an important role.  Because skin is not sterile, your skin needs to be as free of germs as possible.  You can reduce the number of germs on your skin by washing with CHG (chlorahexidine gluconate) soap before surgery.  CHG is an antiseptic cleaner which kills germs and bonds with the skin to continue killing germs even after washing. Please DO NOT use if you have an allergy to CHG or antibacterial soaps.  If your skin becomes reddened/irritated stop using the CHG and inform your nurse when you arrive at Short Stay. Do not shave (including legs and underarms) for at least 48 hours prior to the first  CHG shower.  You may shave your face/neck.  Please follow these instructions carefully:  1.  Shower with CHG Soap the night before surgery and the  morning of surgery.  2.  If you choose to wash your hair, wash your hair first as usual with your normal  shampoo.  3.  After you shampoo, rinse your hair and body thoroughly to remove the shampoo.  4.  Use CHG as you would any other liquid soap.  You can apply chg directly to the skin and wash.  Gently with a scrungie or clean washcloth.  5.  Apply the CHG Soap to your body ONLY FROM THE NECK DOWN.   Do   not use on face/ open                           Wound or open sores. Avoid contact with eyes, ears mouth and   genitals (private parts).                       Wash face,  Genitals (private parts) with your normal soap.             6.  Wash thoroughly, paying special attention to the area where your    surgery  will be performed.  7.  Thoroughly rinse your body with warm water  from the neck down.  8.  DO NOT shower/wash with your normal soap after using and rinsing off the CHG Soap.                9.  Pat yourself dry with a clean towel.            10.  Wear clean pajamas.            11.  Place clean sheets on your bed the night of your first shower and do not  sleep with pets. Day of Surgery : Do not apply any lotions/deodorants the morning of surgery.  Please wear clean clothes to the hospital/surgery center.  FAILURE TO FOLLOW THESE INSTRUCTIONS MAY RESULT IN THE CANCELLATION OF YOUR SURGERY  PATIENT SIGNATURE_________________________________  NURSE SIGNATURE__________________________________  ________________________________________________________________________

## 2024-04-10 ENCOUNTER — Other Ambulatory Visit: Payer: Self-pay

## 2024-04-10 ENCOUNTER — Encounter (HOSPITAL_COMMUNITY): Payer: Self-pay

## 2024-04-10 ENCOUNTER — Encounter (HOSPITAL_COMMUNITY)
Admission: RE | Admit: 2024-04-10 | Discharge: 2024-04-10 | Disposition: A | Source: Ambulatory Visit | Attending: Urology

## 2024-04-10 VITALS — BP 137/86 | HR 76 | Temp 97.6°F | Resp 16 | Ht 72.0 in | Wt 249.4 lb

## 2024-04-10 DIAGNOSIS — Z01818 Encounter for other preprocedural examination: Secondary | ICD-10-CM | POA: Insufficient documentation

## 2024-04-10 DIAGNOSIS — Z7984 Long term (current) use of oral hypoglycemic drugs: Secondary | ICD-10-CM | POA: Insufficient documentation

## 2024-04-10 DIAGNOSIS — I1310 Hypertensive heart and chronic kidney disease without heart failure, with stage 1 through stage 4 chronic kidney disease, or unspecified chronic kidney disease: Secondary | ICD-10-CM | POA: Insufficient documentation

## 2024-04-10 DIAGNOSIS — Q6239 Other obstructive defects of renal pelvis and ureter: Secondary | ICD-10-CM | POA: Insufficient documentation

## 2024-04-10 DIAGNOSIS — E119 Type 2 diabetes mellitus without complications: Secondary | ICD-10-CM

## 2024-04-10 DIAGNOSIS — Z9221 Personal history of antineoplastic chemotherapy: Secondary | ICD-10-CM | POA: Insufficient documentation

## 2024-04-10 DIAGNOSIS — K219 Gastro-esophageal reflux disease without esophagitis: Secondary | ICD-10-CM | POA: Insufficient documentation

## 2024-04-10 DIAGNOSIS — N183 Chronic kidney disease, stage 3 unspecified: Secondary | ICD-10-CM | POA: Insufficient documentation

## 2024-04-10 DIAGNOSIS — E1122 Type 2 diabetes mellitus with diabetic chronic kidney disease: Secondary | ICD-10-CM | POA: Insufficient documentation

## 2024-04-10 DIAGNOSIS — Z7985 Long-term (current) use of injectable non-insulin antidiabetic drugs: Secondary | ICD-10-CM | POA: Insufficient documentation

## 2024-04-10 DIAGNOSIS — J449 Chronic obstructive pulmonary disease, unspecified: Secondary | ICD-10-CM | POA: Insufficient documentation

## 2024-04-10 DIAGNOSIS — I422 Other hypertrophic cardiomyopathy: Secondary | ICD-10-CM | POA: Insufficient documentation

## 2024-04-10 DIAGNOSIS — I440 Atrioventricular block, first degree: Secondary | ICD-10-CM | POA: Insufficient documentation

## 2024-04-10 DIAGNOSIS — I2510 Atherosclerotic heart disease of native coronary artery without angina pectoris: Secondary | ICD-10-CM | POA: Insufficient documentation

## 2024-04-10 DIAGNOSIS — Z85048 Personal history of other malignant neoplasm of rectum, rectosigmoid junction, and anus: Secondary | ICD-10-CM | POA: Insufficient documentation

## 2024-04-10 DIAGNOSIS — F319 Bipolar disorder, unspecified: Secondary | ICD-10-CM | POA: Insufficient documentation

## 2024-04-10 DIAGNOSIS — E1142 Type 2 diabetes mellitus with diabetic polyneuropathy: Secondary | ICD-10-CM | POA: Insufficient documentation

## 2024-04-10 LAB — BASIC METABOLIC PANEL WITH GFR
Anion gap: 11 (ref 5–15)
BUN: 8 mg/dL (ref 8–23)
CO2: 27 mmol/L (ref 22–32)
Calcium: 9.3 mg/dL (ref 8.9–10.3)
Chloride: 100 mmol/L (ref 98–111)
Creatinine, Ser: 1.57 mg/dL — ABNORMAL HIGH (ref 0.61–1.24)
GFR, Estimated: 47 mL/min — ABNORMAL LOW
Glucose, Bld: 138 mg/dL — ABNORMAL HIGH (ref 70–99)
Potassium: 4.2 mmol/L (ref 3.5–5.1)
Sodium: 138 mmol/L (ref 135–145)

## 2024-04-10 LAB — GLUCOSE, CAPILLARY: Glucose-Capillary: 149 mg/dL — ABNORMAL HIGH (ref 70–99)

## 2024-04-10 NOTE — Progress Notes (Signed)
 " Case: [id number] Date/Time: 04/13/24 0945   Procedures:      CYSTOSCOPY, FLEXIBLE, WITH STENT REPLACEMENT (Left)     CYSTOSCOPY, WITH RETROGRADE PYELOGRAM (Left)   Anesthesia type: General   Diagnosis: Other obstructive defects of renal pelvis and ureter [Q62.39]   Pre-op diagnosis: Other obstructive defects of renal pelvis and ureter   Location: [facility] ROOM 03 / [facility]   Surgeons: [name], MD       DISCUSSION: William Good is a 69 yo male with PMH of HTN, apical HCM, CAD, COPD, migraines, RLS, GERD, hx of rectal cancer s/p LAR (2023) and chemo, T2DM, congenital UPJO with chronic left hydronephrosis managed with stent exchanges, CKD3, developmental delay, anxiety, depression, bipolar d/o.  Last stent exchange was on 06/22/23 and was uncomplicated.   Patient follows with Cardiology for hx of apical HCM. Last seen on 01/28/23 by Dr. Lavona. Noted to be asymptomatic from cardiac standpoint. Per Dr. Lavona: He has wanted conservative therapy.  He does not want further med titration.  He is not having any symptoms.  No change in therapy.  He will continue on the meds as listed. Advised f/u in 6 months but has missed his f/u.  Patient follows with Oncology for hx of rectal cancer s/p tx. Last seen on 10/24/23 and noted to be in clinical remission. Advised f/u in 6 months.   Seen in the ED on 03/06/24 for constipation and hematuria. Treated for complicated UTI.  Followed up with PCP on 03/30/24. Stable at that visit. No significant changes to medications made. Assessment per Dr. Frederik: He has chronic shortness of breath due to COPD.  He was a long-term smoker.  He denies exertional chest pain.  He has a history of hypertrophic cardiomyopathy, essential hypertension and mixed hyperlipidemia.  He saw his cardiologist last 01/28/2023.  His appetite is fair.  He does not have a balanced diet.  He has a history of gastroesophageal reflux but denies dysphagia.  He has a history of a  tubular adenoma of his colon and rectal cancer. He saw his oncologist last on 10/24/2023.  He suffers from chronic obesity.  He has some urinary hesitancy and dribbling and sense of incomplete voiding.  He has recently noted some blood in his urine and had some dysuria. He has hydronephrosis of his left kidney with atrophy and an obstructive uropathy.  He has a history of chronic kidney disease saw his nephrologist last on 03/07/2023.  He has type 2 diabetes. He does not monitor his blood sugar sugar on a regular basis.  He has chronic pain in his back and lower extremities.  He has a peripheral neuropathy in his feet and legs due to chemotherapy that he received for his rectal cancer.  He has a history of migraine headaches.  He has a history of bipolar disorder and psychotic depression and sees Dr. Vincente for psychiatry.  Discussed with Dr. Mallory. Ok to proceed without further cardiac eval  LD Ozempic: 1/27  VS: BP 137/86   Pulse 76   Temp 36.4 C (Oral)   Resp 16   Ht 6' (1.829 m)   Wt 113.1 kg   SpO2 98%   BMI 33.82 kg/m   PROVIDERS: Frederik Charleston, MD   LABS: Labs reviewed: Acceptable for surgery. (all labs ordered are listed, but only abnormal results are displayed)  Labs Reviewed  BASIC METABOLIC PANEL WITH GFR - Abnormal; Notable for the following components:      Result Value  Glucose, Bld 138 (*)    Creatinine, Ser 1.57 (*)    GFR, Estimated 47 (*)    All other components within normal limits  GLUCOSE, CAPILLARY - Abnormal; Notable for the following components:   Glucose-Capillary 149 (*)    All other components within normal limits     CT Abdomen/Pelvis 03/21/24:  IMPRESSION: 1. Stable surgical changes involving the rectum with a low rectal anastomosis. No findings for recurrent tumor, regional lymphadenopathy or metastatic disease. 2. Stable 2.6 cm right adrenal gland myelolipoma. 3. Stable chronic severe atrophy of the left kidney with a double-J ureteral  stent in unchanged position. 4. Stable bilateral renal cysts and renal calculi. 5. Stable slight asymmetric left-sided bladder wall thickening.   EKG 04/10/2024:  Sinus rhythm first-degree AV block Left ventricular hypertrophy with repolarization abnormality  Cardiac MRI 01/20/2022:  IMPRESSION: 1. Normal left ventricular size and systolic function.  LVEF = 66%).   2. There is severe apical left ventricular hypertrophy, with apical walls measuring upto 1.9 cm in max diameter.   3. 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   4. There is LGE (scar) in the midwall of the apical left ventricular myocardium (areas of maximal thickness).   5. LGE/scar tissue comprises about 5% of total myocardial mass.   6. Mild basal to mid LV wall thickness, with no LVOT obstruction.   7. Findings consistent with hypertrophic cardiomyopathy (HCM), apical variant.    Event monitoring 01/18/2022:  Normal sinus rhythm One run of SVT lasting 4 beats Rare ventricular ectopy No sustained arrhythmias    Echo 04/01/2021:  IMPRESSIONS    1. There is severe asymmetric hypertrophy of the apical segments (up to 18 mm) consistent with apical variant hypertrophic cardiomyopathy. There is concern for an apical aneurysm on this study, however this was a difficult study. Would recommend cardiac  MRI for better characterization. Left ventricular ejection fraction, by estimation, is 55 to 60%. The left ventricle has normal function. The left ventricle has no regional wall motion abnormalities. There is severe asymmetric left ventricular hypertrophy of the apical segment. Left ventricular diastolic parameters are consistent with Grade I diastolic dysfunction (impaired relaxation).  2. Right ventricular systolic function is normal. The right ventricular size is normal. Tricuspid regurgitation signal is inadequate for assessing PA pressure.  3. The mitral valve is grossly normal. Trivial mitral  valve regurgitation. No evidence of mitral stenosis.  4. The aortic valve is grossly normal. Aortic valve regurgitation is not visualized. No aortic stenosis is present.  5. The inferior vena cava is normal in size with greater than 50% respiratory variability, suggesting right atrial pressure of 3 mmHg. Past Medical History:  Diagnosis Date   Anginal pain    Anxiety    Back pain    Bipolar disorder (HCC)    bipolar 1   CKD (chronic kidney disease) stage 2, GFR 60-89 ml/min 09/24/2020   COPD (chronic obstructive pulmonary disease) (HCC)    Coronary artery disease    Depression    GERD (gastroesophageal reflux disease)    High output ileostomy (HCC) 05/29/2021   History of kidney stones    Hypertension    Knee pain    Migraine    Palpitation    Rectal cancer (HCC)    Type 2 diabetes mellitus with complication, without long-term current use of insulin  (HCC) 03/19/2021   no meds    Past Surgical History:  Procedure Laterality Date   BACK SURGERY     CYSTOSCOPY W/ RETROGRADES Left  06/22/2023   Procedure: CYSTOSCOPY, WITH RETROGRADE PYELOGRAM;  Surgeon: [name], MD;  [facility]: [facility];  Service: Urology;  Laterality: Left;   CYSTOSCOPY W/ URETERAL STENT REMOVAL Right 11/26/2022   Procedure: CYSTOSCOPY WITH STENT REMOVAL;  Surgeon: [name], MD;  [facility];  Service: Urology;  Laterality: Right;   CYSTOSCOPY WITH RETROGRADE PYELOGRAM, URETEROSCOPY AND STENT PLACEMENT Bilateral 08/13/2022   Procedure: CYSTOSCOPY WITH BILATERAL RETROGRADE PYELOGRAMS, RIGHT  URETEROSCOPY AND BILATERAL STENT PLACEMENT;  Surgeon: [name], MD;  [facility];  Service: Urology;  Laterality: Bilateral;  75 MINS   CYSTOSCOPY WITH STENT PLACEMENT Left 11/26/2022   Procedure: CYSTOSCOPY WITH STENT EXCHANGE;  Surgeon: [name], MD;  [facility];  Service: Urology;  Laterality: Left;   CYSTOSCOPY WITH URETEROSCOPY AND STENT PLACEMENT Left 06/22/2023    Procedure: CYSTOSCOPY WITH LEFT STENT EXCHANGE;  Surgeon: [name], MD;  [facility];  Service: Urology;  Laterality: Left;   HOLMIUM LASER APPLICATION Right 08/13/2022   Procedure: HOLMIUM LASER APPLICATION;  Surgeon: [name], MD;  [facility];  Service: Urology;  Laterality: Right;   ILEO LOOP DIVERSION N/A 09/03/2021   Procedure: OPEN TAKEDOWN OF LOOP ILEOSTOMY;  Surgeon: Sheldon Standing, MD;  [facility];  Service: General;  Laterality: N/A;   JOINT REPLACEMENT Right    knee   OSTOMY N/A 05/27/2021   Procedure: POSSIBLE OSTOMY;  Surgeon: Sheldon Standing, MD;  [facility];  Service: General;  Laterality: N/A;   PORT-A-CATH REMOVAL Right 09/03/2021   Procedure: REMOVAL PORT-A-CATH;  Surgeon: Sheldon Standing, MD;  [facility];  Service: General;  Laterality: Right;   PORTACATH PLACEMENT N/A 10/02/2020   Procedure: PORT-A-CATH PLACEMENT;  Surgeon: Dasie Leonor CROME, MD;  [facility];  Service: General;  Laterality: N/A;   PROCTOSCOPY N/A 05/27/2021   Procedure: RIGID PROCTOSCOPY;  Surgeon: Sheldon Standing, MD;  [facility];  Service: General;  Laterality: N/A;   XI ROBOTIC ASSISTED LOWER ANTERIOR RESECTION N/A 05/27/2021   Procedure: ROBOTIC LOW ANTERIOR COLON RESECTION, VERY LOW COLOANAL ANASTOMOSIS, DIVERTING ILEOSTOMY, BILATERAL TAP BLOCK,  FIRFLY INJECTION;  Surgeon: Sheldon Standing, MD;  [facility];  Service: General;  Laterality: N/A;    MEDICATIONS:  acetaminophen  (TYLENOL ) 325 MG tablet   ALPRAZolam  (XANAX ) 1 MG tablet   amLODipine (NORVASC) 10 MG tablet   aspirin  EC 81 MG tablet   Cholecalciferol  (VITAMIN D3) 50 MCG (2000 UT) capsule   diphenoxylate-atropine (LOMOTIL) 2.5-0.025 MG tablet   glipiZIDE (GLUCOTROL XL) 10 MG 24 hr tablet   HYDROcodone -acetaminophen  (NORCO/VICODIN) 5-325 MG tablet   METAMUCIL FIBER PO   mirtazapine  (REMERON ) 45 MG tablet   omeprazole  (PRILOSEC) 20 MG capsule   OZEMPIC, 1 MG/DOSE, 4 MG/3ML SOPN    pravastatin  (PRAVACHOL ) 40 MG tablet   propranolol  ER (INDERAL  LA) 160 MG SR capsule   SUMAtriptan  (IMITREX ) 100 MG tablet   tamsulosin  (FLOMAX ) 0.4 MG CAPS capsule   traZODone (DESYREL) 50 MG tablet   No current facility-administered medications for this encounter.   Burnard CHRISTELLA Odis DEVONNA [facility] Short Stay/Anesthesiology [facility] Phone [phone] 04/10/2024 1:27 PM        "

## 2024-04-13 ENCOUNTER — Other Ambulatory Visit: Payer: Self-pay

## 2024-04-13 ENCOUNTER — Encounter (HOSPITAL_COMMUNITY): Admitting: Medical

## 2024-04-13 ENCOUNTER — Ambulatory Visit (HOSPITAL_COMMUNITY)

## 2024-04-13 ENCOUNTER — Encounter (HOSPITAL_COMMUNITY): Admitting: Anesthesiology

## 2024-04-13 ENCOUNTER — Encounter (HOSPITAL_COMMUNITY): Admission: RE | Disposition: A | Payer: Self-pay | Source: Home / Self Care | Attending: Urology

## 2024-04-13 ENCOUNTER — Ambulatory Visit (HOSPITAL_COMMUNITY)
Admission: RE | Admit: 2024-04-13 | Discharge: 2024-04-13 | Disposition: A | Source: Home / Self Care | Attending: Urology | Admitting: Urology

## 2024-04-13 ENCOUNTER — Encounter (HOSPITAL_COMMUNITY): Payer: Self-pay | Admitting: Urology

## 2024-04-13 DIAGNOSIS — E119 Type 2 diabetes mellitus without complications: Secondary | ICD-10-CM

## 2024-04-13 LAB — GLUCOSE, CAPILLARY
Glucose-Capillary: 103 mg/dL — ABNORMAL HIGH (ref 70–99)
Glucose-Capillary: 131 mg/dL — ABNORMAL HIGH (ref 70–99)

## 2024-04-13 MED ORDER — LIDOCAINE HCL (PF) 2 % IJ SOLN
INTRAMUSCULAR | Status: DC | PRN
  Administered 2024-04-13: 5 mg via INTRADERMAL

## 2024-04-13 MED ORDER — VANCOMYCIN HCL 1000 MG IV SOLR
INTRAVENOUS | Status: DC | PRN
  Administered 2024-04-13: 1000 mg via INTRAVENOUS

## 2024-04-13 MED ORDER — OXYCODONE HCL 5 MG PO TABS
5.0000 mg | ORAL_TABLET | Freq: Once | ORAL | Status: AC | PRN
  Administered 2024-04-13: 5 mg via ORAL

## 2024-04-13 MED ORDER — FENTANYL CITRATE (PF) 100 MCG/2ML IJ SOLN
INTRAMUSCULAR | Status: AC
  Filled 2024-04-13: qty 2

## 2024-04-13 MED ORDER — CHLORHEXIDINE GLUCONATE 0.12 % MT SOLN
15.0000 mL | Freq: Once | OROMUCOSAL | Status: AC
  Administered 2024-04-13: 15 mL via OROMUCOSAL

## 2024-04-13 MED ORDER — ONDANSETRON HCL 4 MG/2ML IJ SOLN
INTRAMUSCULAR | Status: DC | PRN
  Administered 2024-04-13: 4 mg via INTRAVENOUS

## 2024-04-13 MED ORDER — ORAL CARE MOUTH RINSE: 15.0000 mL | Freq: Once | OROMUCOSAL | Status: AC

## 2024-04-13 MED ORDER — MIDAZOLAM HCL 2 MG/2ML IJ SOLN
INTRAMUSCULAR | Status: AC
  Filled 2024-04-13: qty 2

## 2024-04-13 MED ORDER — STERILE WATER FOR IRRIGATION IR SOLN
Status: DC | PRN
  Administered 2024-04-13: 1000 mL

## 2024-04-13 MED ORDER — MIDAZOLAM HCL 5 MG/5ML IJ SOLN
INTRAMUSCULAR | Status: DC | PRN
  Administered 2024-04-13: 2 mg via INTRAVENOUS

## 2024-04-13 MED ORDER — ONDANSETRON HCL 4 MG/2ML IJ SOLN
INTRAMUSCULAR | Status: AC
  Filled 2024-04-13: qty 2

## 2024-04-13 MED ORDER — DEXAMETHASONE SOD PHOSPHATE PF 10 MG/ML IJ SOLN
INTRAMUSCULAR | Status: AC
  Filled 2024-04-13: qty 1

## 2024-04-13 MED ORDER — LIDOCAINE HCL (PF) 2 % IJ SOLN
INTRAMUSCULAR | Status: AC
  Filled 2024-04-13: qty 5

## 2024-04-13 MED ORDER — DROPERIDOL 2.5 MG/ML IJ SOLN: 0.6250 mg | Freq: Once | INTRAMUSCULAR | Status: DC | PRN

## 2024-04-13 MED ORDER — FENTANYL CITRATE (PF) 100 MCG/2ML IJ SOLN
INTRAMUSCULAR | Status: DC | PRN
  Administered 2024-04-13: 50 ug via INTRAVENOUS

## 2024-04-13 MED ORDER — PROPOFOL 10 MG/ML IV BOLUS
INTRAVENOUS | Status: DC | PRN
  Administered 2024-04-13: 140 mg via INTRAVENOUS

## 2024-04-13 MED ORDER — FENTANYL CITRATE (PF) 50 MCG/ML IJ SOSY: 25.0000 ug | PREFILLED_SYRINGE | INTRAMUSCULAR | Status: DC | PRN

## 2024-04-13 MED ORDER — GENTAMICIN SULFATE 40 MG/ML IJ SOLN
5.0000 mg/kg | INTRAVENOUS | Status: AC
  Administered 2024-04-13: 460 mg via INTRAVENOUS
  Filled 2024-04-13: qty 11.5

## 2024-04-13 MED ORDER — OXYCODONE HCL 5 MG PO TABS
ORAL_TABLET | ORAL | Status: AC
  Filled 2024-04-13: qty 1

## 2024-04-13 MED ORDER — OXYCODONE HCL 5 MG/5ML PO SOLN: 5.0000 mg | Freq: Once | ORAL | Status: AC | PRN

## 2024-04-13 MED ORDER — SODIUM CHLORIDE 0.9 % IR SOLN
Status: DC | PRN
  Administered 2024-04-13: 3000 mL via INTRAVESICAL

## 2024-04-13 MED ORDER — DEXAMETHASONE SOD PHOSPHATE PF 10 MG/ML IJ SOLN
INTRAMUSCULAR | Status: DC | PRN
  Administered 2024-04-13: 10 mg via INTRAVENOUS

## 2024-04-13 MED ORDER — IOHEXOL 300 MG/ML  SOLN
INTRAMUSCULAR | Status: DC | PRN
  Administered 2024-04-13: 10 mL via URETHRAL

## 2024-04-13 MED ORDER — ACETAMINOPHEN 10 MG/ML IV SOLN: 1000.0000 mg | Freq: Once | INTRAVENOUS | Status: DC | PRN

## 2024-04-13 MED ORDER — LACTATED RINGERS IV SOLN: INTRAVENOUS | Status: DC

## 2024-04-13 NOTE — Anesthesia Postprocedure Evaluation (Signed)
"   Anesthesia Post Note  Patient: MAGDALENO LORTIE  Procedure(s) Performed: PHYLLIS SIDE, WITH STENT REPLACEMENT (Left: Bladder) CYSTOSCOPY, WITH RETROGRADE PYELOGRAM (Left)     Patient location during evaluation: PACU Anesthesia Type: General Level of consciousness: awake and alert Pain management: pain level controlled Vital Signs Assessment: post-procedure vital signs reviewed and stable Respiratory status: spontaneous breathing, nonlabored ventilation, respiratory function stable and patient connected to nasal cannula oxygen Cardiovascular status: blood pressure returned to baseline and stable Postop Assessment: no apparent nausea or vomiting Anesthetic complications: no   No notable events documented.  Last Vitals:  Vitals:   04/13/24 1100 04/13/24 1115  BP: 130/85 134/85  Pulse: 70 69  Resp: 16 20  Temp: (!) 36.4 C (!) 36.2 C  SpO2: 94% 92%    Last Pain:  Vitals:   04/13/24 1115  TempSrc: Oral  PainSc: 0-No pain                 Franky JONETTA Bald      "

## 2024-04-13 NOTE — H&P (Signed)
 William Good is an 69 y.o. male.    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 Renogram with 6% relative function. Given very poor functional capacity, elected initial management with chronic stenting.  Recent Course: 08/2022 - Left JJ stent placed (Also Rt URS and stent for stone in functionally solitary kidney) 11/2022 - Left JJ stent (6x26 contour) replaced / rt stent removed 06/2023 - Left JJ stent exchange (6x26 contour) , R RPG, CT stone free 12/2023 KUB, RUS - stone free, stable bilat cysts ==> try to get another 6mos out of stent  2 - Right Renal Cyst / R/O UPJ Stone - About 6cm Rt renal cyst that extends into renal sinus. ? UPJ area calcification on CT 2024 that was not present 2022. This is his dominant functional kidney.  3- Prostate Screening - 2024 - PSA 0.17  4 - Stage 3b Renal Insufficiency - Cr 1.7's, GFR 40 at baseline. Atrophic left kidney and long h/o DM, HTN.  5 - Lower Urinary Tract symptoms - on tamsulsin at baseline.  PMH sig for colon cancer / resection (temporary colostomy, reversal 2023), very large truncal obesity, some developmental delay (does not drive, lives by himself, makes own decisions, brother Velinda very involved and lives close. His PCP is Lamar Ng MD.  Today Garrel is seen to proceed with LEFT ureteral stent exchange. No interval fevers. Most recent UCX enterococcus sens amp, nitroF, Vanc. Cr 1.57 most recently.   Past Medical History:  Diagnosis Date   Anginal pain    Anxiety    Back pain    Bipolar disorder (HCC)    bipolar 1   CKD (chronic kidney disease) stage 2, GFR 60-89 ml/min 09/24/2020   COPD (chronic obstructive pulmonary disease) (HCC)    Coronary artery disease    Depression    GERD (gastroesophageal reflux  disease)    High output ileostomy (HCC) 05/29/2021   History of kidney stones    Hypertension    Knee pain    Migraine    Palpitation    Rectal cancer (HCC)    Type 2 diabetes mellitus with complication, without long-term current use of insulin  (HCC) 03/19/2021   no meds    Past Surgical History:  Procedure Laterality Date   BACK SURGERY     CYSTOSCOPY W/ RETROGRADES Left 06/22/2023   Procedure: CYSTOSCOPY, WITH RETROGRADE PYELOGRAM;  Surgeon: [name], MD;  Location: [facility];  Service: Urology;  Laterality: Left;   CYSTOSCOPY W/ URETERAL STENT REMOVAL Right 11/26/2022   Procedure: CYSTOSCOPY WITH STENT REMOVAL;  Surgeon: [name], MD;  Location: [facility];  Service: Urology;  Laterality: Right;   CYSTOSCOPY WITH RETROGRADE PYELOGRAM, URETEROSCOPY AND STENT PLACEMENT Bilateral 08/13/2022   Procedure: CYSTOSCOPY WITH BILATERAL RETROGRADE PYELOGRAMS, RIGHT  URETEROSCOPY AND BILATERAL STENT PLACEMENT;  Surgeon: [name], MD;  Location: [facility];  Service: Urology;  Laterality: Bilateral;  75 MINS   CYSTOSCOPY WITH STENT PLACEMENT Left 11/26/2022   Procedure: CYSTOSCOPY WITH STENT EXCHANGE;  Surgeon: [name], MD;  Location: [facility];  Service: Urology;  Laterality: Left;   CYSTOSCOPY WITH URETEROSCOPY AND STENT PLACEMENT Left 06/22/2023   Procedure: CYSTOSCOPY WITH LEFT STENT EXCHANGE;  Surgeon: [name], MD;  Location: [facility];  Service: Urology;  Laterality: Left;   HOLMIUM LASER APPLICATION Right 08/13/2022   Procedure: HOLMIUM LASER APPLICATION;  Surgeon: [name], MD;  Location: [facility];  Service: Urology;  Laterality: Right;   ILEO LOOP DIVERSION N/A 09/03/2021   Procedure: OPEN TAKEDOWN OF LOOP ILEOSTOMY;  Surgeon: Sheldon Standing, MD;  Location: [facility];  Service: General;  Laterality: N/A;   JOINT REPLACEMENT Right    knee   OSTOMY N/A 05/27/2021   Procedure: POSSIBLE OSTOMY;  Surgeon: Sheldon Standing, MD;  Location: [facility];   Service: General;  Laterality: N/A;   PORT-A-CATH REMOVAL Right 09/03/2021   Procedure: REMOVAL PORT-A-CATH;  Surgeon: Sheldon Standing, MD;  Location: [facility];  Service: General;  Laterality: Right;   PORTACATH PLACEMENT N/A 10/02/2020   Procedure: PORT-A-CATH PLACEMENT;  Surgeon: Dasie Leonor CROME, MD;  Location: [facility];  Service: General;  Laterality: N/A;   PROCTOSCOPY N/A 05/27/2021   Procedure: RIGID PROCTOSCOPY;  Surgeon: Sheldon Standing, MD;  Location: [facility];  Service: General;  Laterality: N/A;   XI ROBOTIC ASSISTED LOWER ANTERIOR RESECTION N/A 05/27/2021   Procedure: ROBOTIC LOW ANTERIOR COLON RESECTION, VERY LOW COLOANAL ANASTOMOSIS, DIVERTING ILEOSTOMY, BILATERAL TAP BLOCK,  FIRFLY INJECTION;  Surgeon: Sheldon Standing, MD;  Location: [facility];  Service: General;  Laterality: N/A;    Family History  Problem Relation Age of Onset   ALS Mother    Heart disease Mother    Parkinson's disease Mother    Heart failure Father    Social History:  reports that he quit smoking about 2 years ago. His smoking use included cigarettes. He started smoking about 48 years ago. He has a 22.5 pack-year smoking history. He has never been exposed to tobacco smoke. He has never used smokeless tobacco. He reports that he does not drink alcohol and does not use drugs.  Allergies: Allergies[1]  Medications Prior to Admission  Medication Sig Dispense Refill   acetaminophen  (TYLENOL ) 325 MG tablet Take 650 mg by mouth every 6 (six) hours as needed for mild pain.     ALPRAZolam  (XANAX ) 1 MG tablet Take 1 tablet (1 mg total) by mouth 2 (two) times daily. Every morning as needed for anxiety, nerves, and behavior 60 tablet 0   amLODipine (NORVASC) 10 MG tablet Take 10 mg by mouth daily.     diphenoxylate-atropine (LOMOTIL) 2.5-0.025 MG tablet Take 1 tablet by mouth 4 (four) times daily as needed for diarrhea or loose stools.     glipiZIDE (GLUCOTROL XL) 10 MG 24 hr tablet Take 10 mg by mouth in the morning and at bedtime.      METAMUCIL FIBER PO Take 0.5 Scoops by mouth daily as needed (regularity).     mirtazapine  (REMERON ) 45 MG tablet Take 1 tablet (45 mg total) by mouth at bedtime. 30 tablet 0   omeprazole  (PRILOSEC) 20 MG capsule Take 1 capsule (20 mg total) 2 (two) times daily by mouth. 60 capsule 0   OZEMPIC, 1 MG/DOSE, 4 MG/3ML SOPN Inject 1 mg into the skin every Tuesday.     pravastatin  (PRAVACHOL ) 40 MG tablet Take 1 tablet (40 mg total) by mouth daily. 30 tablet 0   propranolol  ER (INDERAL  LA) 160 MG SR capsule Take 1 capsule (160 mg total) by mouth daily.     SUMAtriptan  (IMITREX ) 100 MG tablet Take 1 tablet (100 mg total) by mouth 2 (two) times daily as needed for migraine. 10 tablet 0   tamsulosin  (FLOMAX ) 0.4 MG CAPS capsule Take 1 capsule (0.4 mg  total) by mouth at bedtime. 30 capsule 0   traZODone (DESYREL) 50 MG tablet Take 50 mg by mouth at bedtime.     aspirin  EC 81 MG tablet Take 81 mg by mouth daily. Swallow whole.     Cholecalciferol  (VITAMIN D3) 50 MCG (2000 UT) capsule Take 2,000 Units by mouth daily.     HYDROcodone -acetaminophen  (NORCO/VICODIN) 5-325 MG tablet Take 1 tablet by mouth every 6 (six) hours as needed for severe pain (pain score 7-10). (Patient not taking: No sig reported) 15 tablet 0    Results for orders placed or performed during the hospital encounter of 04/13/24 (from the past 48 hours)  Glucose, capillary     Status: Abnormal   Collection Time: 04/13/24  8:14 AM  Result Value Ref Range   Glucose-Capillary 103 (H) 70 - 99 mg/dL    Comment: Glucose reference range applies only to samples taken after fasting for at least 8 hours.   No results found.  Review of Systems  Constitutional:  Negative for chills and fever.  All other systems reviewed and are negative.   Blood pressure 139/87, pulse 75, temperature 98 F (36.7 C), temperature source Oral, resp. rate 16, SpO2 95%. Physical Exam Vitals reviewed.  HENT:     Head: Normocephalic.  Eyes:     Pupils: Pupils  are equal, round, and reactive to light.  Cardiovascular:     Rate and Rhythm: Normal rate.  Pulmonary:     Effort: Pulmonary effort is normal.  Abdominal:     General: Abdomen is flat.  Genitourinary:    Comments: No CVAT at present Musculoskeletal:        General: Normal range of motion.     Cervical back: Normal range of motion.  Skin:    General: Skin is warm.  Neurological:     General: No focal deficit present.     Mental Status: He is alert.  Psychiatric:        Mood and Affect: Mood normal.      Assessment/Plan  Proceed as planned with LEFT ureteral stent exchange. Risks, benefits, alternatives, expectd peri-op course discussed previously and reiterated today.   Ricardo KATHEE Alvaro Mickey., MD 04/13/2024, 8:21 AM       [1]  Allergies Allergen Reactions   Nsaids Other (See Comments)    Chronic kidney disease - avoid NSAIDs

## 2024-04-13 NOTE — Op Note (Unsigned)
 NAME: William Good, William Good MEDICAL RECORD NO: [id number] ACCOUNT NO: [id number] DATE OF BIRTH: 26-Sep-1955 FACILITY: [facility] LOCATION: [facility] PHYSICIAN: Ricardo Likens, MD  Operative Report   DATE OF PROCEDURE: 04/13/2024  SURGEON: Ricardo Likens, MD  PREOPERATIVE DIAGNOSIS:  Atrophic left kidney with obstruction.  PROCEDURE PERFORMED:  Cystoscopy with left retrograde pyelogram interpretation and exchange of left ureteral stent.  ESTIMATED BLOOD LOSS:  Nil.  COMPLICATIONS:  None.  SPECIMENS:  Left ureteral stent for discard.  FINDINGS:  Persistent mass effect from massive left renal cyst and left renal atrophy.  Successful replacement of left ureteral stent, proximal end in renal pelvis, distal end in urinary bladder.  INDICATIONS:  The patient is a 69 year old man with history of developmental delay who is primarily in the care of his brother.  Has a history of left atrophic kidney due to a massive cyst and mass effect of this.  He previously had recurrent infections  related to the obstruction of this.  He has minimal function on the left.  He does have significant comorbidity.  We previously discussed simple left nephrectomy however the patient and family have elected instead to undergo periodic stent exchanges for  decompression.  He is due for this today.  DESCRIPTION OF PROCEDURE:  Informed consent was obtained and placed in the medical record.  Procedure timeout, the patient being the patient was verified.  Procedure being left ureteral stent exchange was confirmed.  Procedure timeout was performed.   Intravenous antibiotics administered.  General LMA anesthesia was introduced.  The patient was placed into a low lithotomy position.  A sterile field was created, prepping and draping the patient's penis, perineum and proximal thighs using iodine.   Cystourethroscopy performed using a 21-French rigid cystoscope with offset lens.  Inspection of the anterior and posterior urethra was  unremarkable.  Inspection of the urinary bladder revealed the distal end of the left ureteral stent in situ.  There was  some moderate incrustation, not severe.  Some mild mucosal edema was surrounding this as anticipated.  No papillary lesions however were noted.  The distal end of the left stent was grasped, removed in its entirety, set aside for discard, and inspected  and found to be intact.  The left ureteral orifice was cannulated with a 6-French end-hole catheter and a left retrograde pyelogram was obtained.  The left retrograde pyelogram demonstrated a single left ureter, single system left kidney.  There was  again significant mass effect from the large renal cyst.  A 0.038 ZIPwire was advanced to the level of the midpole over which a new 7 x 26 Contour half-pipe stent was replaced using direct cystoscopic and fluoroscopic guidance.  Good proximal and distal  planes were noted.  The bladder was partially emptied per cystoscope.  The procedure was then terminated.  The patient tolerated the procedure well.  No immediate periprocedural complications.  The patient was taken to the postanesthesia care unit in  stable condition.  Plan is for discharge home.  I will discuss with his brother once again the option of simple left nephrectomy as more definitive management of this issue.     D: 04/13/2024 10:11:01 am T: 04/13/2024 10:12:00 am  JOB: [id number]/ [id number]

## 2024-04-13 NOTE — Anesthesia Procedure Notes (Signed)
 Procedure Name: LMA Insertion Date/Time: 04/13/2024 9:52 AM  Performed by: Carleton Garnette SAUNDERS, CRNAPre-anesthesia Checklist: Emergency Drugs available, Patient identified, Suction available and Patient being monitored Patient Re-evaluated:Patient Re-evaluated prior to induction Oxygen Delivery Method: Circle system utilized Preoxygenation: Pre-oxygenation with 100% oxygen Induction Type: IV induction LMA: LMA inserted LMA Size: 5.0 Tube type: Oral Number of attempts: 1 Placement Confirmation: positive ETCO2 and breath sounds checked- equal and bilateral Tube secured with: Tape Dental Injury: Teeth and Oropharynx as per pre-operative assessment

## 2024-04-13 NOTE — Brief Op Note (Signed)
 OR Date: 04/13/2024 OR Patient Start Time: 10:27 AM  10:07 AM  PATIENT:  Elsie CHRISTELLA Colt  69 y.o. male  PRE-OPERATIVE DIAGNOSIS:  Other obstructive defects of renal pelvis and ureter  POST-OPERATIVE DIAGNOSIS:  Other obstructive defects of renal pelvis and ureter  PROCEDURE:  Procedures: CYSTOSCOPY, FLEXIBLE, WITH STENT REPLACEMENT (Left) CYSTOSCOPY, WITH RETROGRADE PYELOGRAM (Left)  SURGEON:  Surgeons and Role:    * Manny, Ricardo KATHEE Raddle., MD - Primary  PHYSICIAN ASSISTANT:   ASSISTANTS: none   ANESTHESIA:   general  EBL:  minimal   BLOOD ADMINISTERED:none  DRAINS: none   LOCAL MEDICATIONS USED:  NONE  SPECIMEN:  Source of Specimen:  left ureteral stent  DISPOSITION OF SPECIMEN:  discard  COUNTS:  YES  TOURNIQUET:  * No tourniquets in log *  DICTATION: .Other Dictation: Dictation Number [id number]  PLAN OF CARE: Discharge to home after PACU  PATIENT DISPOSITION:  PACU - hemodynamically stable.   Delay start of Pharmacological VTE agent (>24hrs) due to surgical blood loss or risk of bleeding: yes

## 2024-04-13 NOTE — Discharge Instructions (Signed)
 1 - You may have urinary urgency (bladder spasms) and bloody urine on / off with stent in place. This is normal.  2 - Call MD or go to ER for fever >102, severe pain / nausea / vomiting not relieved by medications, or acute change in medical status

## 2024-04-13 NOTE — Transfer of Care (Signed)
 Immediate Anesthesia Transfer of Care Note  Patient: William Good  Procedure(s) Performed: PHYLLIS SIDE, WITH STENT REPLACEMENT (Left: Bladder) CYSTOSCOPY, WITH RETROGRADE PYELOGRAM (Left)  Patient Location: PACU  Anesthesia Type:General  Level of Consciousness: sedated  Airway & Oxygen Therapy: Patient Spontanous Breathing and Patient connected to face mask oxygen  Post-op Assessment: Report given to RN and Post -op Vital signs reviewed and stable  Post vital signs: Reviewed and stable  Last Vitals:  Vitals Value Taken Time  BP    Temp    Pulse    Resp    SpO2      Last Pain:  Vitals:   04/13/24 0817  TempSrc: Oral  PainSc: 3          Complications: No notable events documented.

## 2024-04-18 ENCOUNTER — Ambulatory Visit: Admitting: Neurology

## 2024-04-26 ENCOUNTER — Other Ambulatory Visit

## 2024-04-26 ENCOUNTER — Ambulatory Visit: Admitting: Oncology
